# Patient Record
Sex: Female | Born: 1950
Health system: Southern US, Community
[De-identification: ages and names within clinical notes are randomized; demographics above are authoritative.]

## PROBLEM LIST (undated history)

## (undated) DIAGNOSIS — R7303 Prediabetes: Secondary | ICD-10-CM

## (undated) DIAGNOSIS — Z720 Tobacco use: Secondary | ICD-10-CM

## (undated) DIAGNOSIS — E785 Hyperlipidemia, unspecified: Secondary | ICD-10-CM

## (undated) DIAGNOSIS — K449 Diaphragmatic hernia without obstruction or gangrene: Secondary | ICD-10-CM

## (undated) DIAGNOSIS — M545 Low back pain, unspecified: Secondary | ICD-10-CM

## (undated) DIAGNOSIS — E079 Disorder of thyroid, unspecified: Secondary | ICD-10-CM

## (undated) DIAGNOSIS — M1990 Unspecified osteoarthritis, unspecified site: Secondary | ICD-10-CM

## (undated) DIAGNOSIS — D649 Anemia, unspecified: Secondary | ICD-10-CM

## (undated) DIAGNOSIS — K219 Gastro-esophageal reflux disease without esophagitis: Secondary | ICD-10-CM

## (undated) DIAGNOSIS — I10 Essential (primary) hypertension: Secondary | ICD-10-CM

## (undated) DIAGNOSIS — G629 Polyneuropathy, unspecified: Secondary | ICD-10-CM

## (undated) DIAGNOSIS — R51 Headache: Secondary | ICD-10-CM

## (undated) DIAGNOSIS — K5792 Diverticulitis of intestine, part unspecified, without perforation or abscess without bleeding: Secondary | ICD-10-CM

## (undated) DIAGNOSIS — M25561 Pain in right knee: Secondary | ICD-10-CM

## (undated) DIAGNOSIS — E039 Hypothyroidism, unspecified: Secondary | ICD-10-CM

## (undated) DIAGNOSIS — J189 Pneumonia, unspecified organism: Secondary | ICD-10-CM

## (undated) DIAGNOSIS — E669 Obesity, unspecified: Secondary | ICD-10-CM

## (undated) DIAGNOSIS — Z9889 Other specified postprocedural states: Secondary | ICD-10-CM

## (undated) HISTORY — DX: Low back pain, unspecified: M54.50

## (undated) HISTORY — DX: Disorder of thyroid, unspecified: E07.9

## (undated) HISTORY — PX: TUBAL LIGATION: SHX77

## (undated) HISTORY — DX: Tobacco use: Z72.0

## (undated) HISTORY — PX: TONSILLECTOMY: SUR1361

## (undated) HISTORY — DX: Polyneuropathy, unspecified: G62.9

## (undated) HISTORY — DX: Low back pain: M54.5

## (undated) HISTORY — PX: EYE SURGERY: SHX253

## (undated) HISTORY — DX: Other specified postprocedural states: Z98.890

## (undated) HISTORY — DX: Pain in right knee: M25.561

## (undated) HISTORY — DX: Diverticulitis of intestine, part unspecified, without perforation or abscess without bleeding: K57.92

## (undated) HISTORY — PX: BACK SURGERY: SHX140

## (undated) HISTORY — DX: Unspecified osteoarthritis, unspecified site: M19.90

## (undated) HISTORY — DX: Hyperlipidemia, unspecified: E78.5

## (undated) HISTORY — PX: OTHER SURGICAL HISTORY: SHX169

## (undated) HISTORY — DX: Diaphragmatic hernia without obstruction or gangrene: K44.9

## (undated) HISTORY — DX: Obesity, unspecified: E66.9

## (undated) SURGERY — ESOPHAGOGASTRODUODENOSCOPY (EGD) WITH PROPOFOL
Anesthesia: Monitor Anesthesia Care

---

## 2000-10-06 ENCOUNTER — Other Ambulatory Visit: Admission: RE | Admit: 2000-10-06 | Discharge: 2000-10-06 | Payer: Self-pay | Admitting: General Surgery

## 2000-10-14 ENCOUNTER — Ambulatory Visit (HOSPITAL_COMMUNITY): Admission: RE | Admit: 2000-10-14 | Discharge: 2000-10-14 | Payer: Self-pay | Admitting: General Surgery

## 2000-10-14 ENCOUNTER — Encounter: Payer: Self-pay | Admitting: General Surgery

## 2003-01-05 ENCOUNTER — Encounter: Payer: Self-pay | Admitting: Emergency Medicine

## 2003-01-05 ENCOUNTER — Inpatient Hospital Stay (HOSPITAL_COMMUNITY): Admission: EM | Admit: 2003-01-05 | Discharge: 2003-01-07 | Payer: Self-pay | Admitting: Emergency Medicine

## 2003-04-13 DIAGNOSIS — K5792 Diverticulitis of intestine, part unspecified, without perforation or abscess without bleeding: Secondary | ICD-10-CM

## 2003-04-13 DIAGNOSIS — Z9889 Other specified postprocedural states: Secondary | ICD-10-CM

## 2003-04-13 HISTORY — DX: Other specified postprocedural states: Z98.890

## 2003-04-13 HISTORY — DX: Diverticulitis of intestine, part unspecified, without perforation or abscess without bleeding: K57.92

## 2003-07-22 ENCOUNTER — Ambulatory Visit (HOSPITAL_COMMUNITY): Admission: RE | Admit: 2003-07-22 | Discharge: 2003-07-22 | Payer: Self-pay | Admitting: Preventative Medicine

## 2003-08-05 ENCOUNTER — Ambulatory Visit (HOSPITAL_COMMUNITY): Admission: RE | Admit: 2003-08-05 | Discharge: 2003-08-05 | Payer: Self-pay | Admitting: Preventative Medicine

## 2003-10-31 ENCOUNTER — Ambulatory Visit (HOSPITAL_COMMUNITY): Admission: RE | Admit: 2003-10-31 | Discharge: 2003-10-31 | Payer: Self-pay | Admitting: General Surgery

## 2004-06-30 ENCOUNTER — Ambulatory Visit (HOSPITAL_COMMUNITY): Admission: RE | Admit: 2004-06-30 | Discharge: 2004-06-30 | Payer: Self-pay | Admitting: General Surgery

## 2005-05-12 ENCOUNTER — Encounter: Admission: RE | Admit: 2005-05-12 | Discharge: 2005-05-12 | Payer: Self-pay | Admitting: Neurosurgery

## 2005-05-27 ENCOUNTER — Inpatient Hospital Stay (HOSPITAL_COMMUNITY): Admission: RE | Admit: 2005-05-27 | Discharge: 2005-05-28 | Payer: Self-pay | Admitting: Neurosurgery

## 2005-12-24 ENCOUNTER — Ambulatory Visit: Payer: Self-pay | Admitting: Family Medicine

## 2005-12-27 ENCOUNTER — Encounter (INDEPENDENT_AMBULATORY_CARE_PROVIDER_SITE_OTHER): Payer: Self-pay | Admitting: Family Medicine

## 2006-01-10 ENCOUNTER — Ambulatory Visit: Payer: Self-pay | Admitting: Family Medicine

## 2006-02-14 ENCOUNTER — Ambulatory Visit: Payer: Self-pay | Admitting: Family Medicine

## 2006-02-25 ENCOUNTER — Ambulatory Visit: Payer: Self-pay | Admitting: Family Medicine

## 2006-03-01 ENCOUNTER — Encounter: Payer: Self-pay | Admitting: Family Medicine

## 2006-03-01 DIAGNOSIS — M5137 Other intervertebral disc degeneration, lumbosacral region: Secondary | ICD-10-CM | POA: Insufficient documentation

## 2006-03-01 DIAGNOSIS — Z8719 Personal history of other diseases of the digestive system: Secondary | ICD-10-CM | POA: Insufficient documentation

## 2006-03-01 DIAGNOSIS — K5730 Diverticulosis of large intestine without perforation or abscess without bleeding: Secondary | ICD-10-CM | POA: Insufficient documentation

## 2006-03-01 DIAGNOSIS — M51379 Other intervertebral disc degeneration, lumbosacral region without mention of lumbar back pain or lower extremity pain: Secondary | ICD-10-CM | POA: Insufficient documentation

## 2006-03-08 ENCOUNTER — Encounter (INDEPENDENT_AMBULATORY_CARE_PROVIDER_SITE_OTHER): Payer: Self-pay | Admitting: Family Medicine

## 2006-03-14 ENCOUNTER — Ambulatory Visit: Payer: Self-pay | Admitting: Family Medicine

## 2006-05-23 ENCOUNTER — Encounter (INDEPENDENT_AMBULATORY_CARE_PROVIDER_SITE_OTHER): Payer: Self-pay | Admitting: Family Medicine

## 2006-06-14 ENCOUNTER — Ambulatory Visit: Payer: Self-pay | Admitting: Family Medicine

## 2006-06-14 DIAGNOSIS — E039 Hypothyroidism, unspecified: Secondary | ICD-10-CM | POA: Insufficient documentation

## 2006-06-14 DIAGNOSIS — E782 Mixed hyperlipidemia: Secondary | ICD-10-CM | POA: Insufficient documentation

## 2006-06-18 ENCOUNTER — Encounter (INDEPENDENT_AMBULATORY_CARE_PROVIDER_SITE_OTHER): Payer: Self-pay | Admitting: Family Medicine

## 2006-06-18 ENCOUNTER — Ambulatory Visit (HOSPITAL_COMMUNITY): Admission: RE | Admit: 2006-06-18 | Discharge: 2006-06-18 | Payer: Self-pay | Admitting: Family Medicine

## 2006-06-29 ENCOUNTER — Telehealth (INDEPENDENT_AMBULATORY_CARE_PROVIDER_SITE_OTHER): Payer: Self-pay | Admitting: Family Medicine

## 2006-07-12 ENCOUNTER — Ambulatory Visit: Payer: Self-pay | Admitting: Family Medicine

## 2006-07-18 ENCOUNTER — Telehealth (INDEPENDENT_AMBULATORY_CARE_PROVIDER_SITE_OTHER): Payer: Self-pay | Admitting: Family Medicine

## 2006-08-11 ENCOUNTER — Encounter (INDEPENDENT_AMBULATORY_CARE_PROVIDER_SITE_OTHER): Payer: Self-pay | Admitting: Family Medicine

## 2006-08-12 ENCOUNTER — Ambulatory Visit: Payer: Self-pay | Admitting: Family Medicine

## 2006-08-18 ENCOUNTER — Ambulatory Visit (HOSPITAL_COMMUNITY): Admission: RE | Admit: 2006-08-18 | Discharge: 2006-08-18 | Payer: Self-pay | Admitting: Family Medicine

## 2006-08-18 ENCOUNTER — Encounter (INDEPENDENT_AMBULATORY_CARE_PROVIDER_SITE_OTHER): Payer: Self-pay | Admitting: Family Medicine

## 2006-08-23 ENCOUNTER — Encounter (INDEPENDENT_AMBULATORY_CARE_PROVIDER_SITE_OTHER): Payer: Self-pay | Admitting: Family Medicine

## 2006-08-26 ENCOUNTER — Ambulatory Visit: Payer: Self-pay | Admitting: Family Medicine

## 2006-08-29 ENCOUNTER — Encounter (INDEPENDENT_AMBULATORY_CARE_PROVIDER_SITE_OTHER): Payer: Self-pay | Admitting: Family Medicine

## 2006-09-01 ENCOUNTER — Encounter (INDEPENDENT_AMBULATORY_CARE_PROVIDER_SITE_OTHER): Payer: Self-pay | Admitting: Family Medicine

## 2006-09-02 ENCOUNTER — Ambulatory Visit (HOSPITAL_COMMUNITY): Admission: RE | Admit: 2006-09-02 | Discharge: 2006-09-02 | Payer: Self-pay | Admitting: Family Medicine

## 2006-09-07 ENCOUNTER — Encounter (INDEPENDENT_AMBULATORY_CARE_PROVIDER_SITE_OTHER): Payer: Self-pay | Admitting: Family Medicine

## 2006-09-29 ENCOUNTER — Telehealth (INDEPENDENT_AMBULATORY_CARE_PROVIDER_SITE_OTHER): Payer: Self-pay | Admitting: *Deleted

## 2006-09-29 ENCOUNTER — Ambulatory Visit: Payer: Self-pay | Admitting: Family Medicine

## 2006-10-28 ENCOUNTER — Ambulatory Visit: Payer: Self-pay | Admitting: Family Medicine

## 2006-10-29 ENCOUNTER — Encounter (INDEPENDENT_AMBULATORY_CARE_PROVIDER_SITE_OTHER): Payer: Self-pay | Admitting: Family Medicine

## 2006-11-15 ENCOUNTER — Encounter: Admission: RE | Admit: 2006-11-15 | Discharge: 2006-11-15 | Payer: Self-pay | Admitting: Neurosurgery

## 2006-11-16 ENCOUNTER — Telehealth (INDEPENDENT_AMBULATORY_CARE_PROVIDER_SITE_OTHER): Payer: Self-pay | Admitting: Family Medicine

## 2006-11-18 ENCOUNTER — Ambulatory Visit: Payer: Self-pay | Admitting: Family Medicine

## 2006-11-19 ENCOUNTER — Encounter (INDEPENDENT_AMBULATORY_CARE_PROVIDER_SITE_OTHER): Payer: Self-pay | Admitting: Family Medicine

## 2006-11-20 LAB — CONVERTED CEMR LAB
Alkaline Phosphatase: 122 units/L — ABNORMAL HIGH (ref 39–117)
Basophils Absolute: 0.1 10*3/uL (ref 0.0–0.1)
Basophils Relative: 1 % (ref 0–1)
CO2: 22 meq/L (ref 19–32)
Chloride: 109 meq/L (ref 96–112)
Eosinophils Relative: 3 % (ref 0–5)
HCT: 41.4 % (ref 36.0–46.0)
Lymphocytes Relative: 40 % (ref 12–46)
Lymphs Abs: 3.9 10*3/uL — ABNORMAL HIGH (ref 0.7–3.3)
Neutro Abs: 4.7 10*3/uL (ref 1.7–7.7)
Neutrophils Relative %: 49 % (ref 43–77)
Platelets: 253 10*3/uL (ref 150–400)
Sodium: 143 meq/L (ref 135–145)
TSH: 10.568 microintl units/mL — ABNORMAL HIGH (ref 0.350–5.50)
Total Bilirubin: 0.6 mg/dL (ref 0.3–1.2)
WBC: 9.7 10*3/uL (ref 4.0–10.5)

## 2006-11-21 ENCOUNTER — Telehealth (INDEPENDENT_AMBULATORY_CARE_PROVIDER_SITE_OTHER): Payer: Self-pay | Admitting: *Deleted

## 2006-11-22 ENCOUNTER — Encounter (INDEPENDENT_AMBULATORY_CARE_PROVIDER_SITE_OTHER): Payer: Self-pay | Admitting: Family Medicine

## 2006-11-24 ENCOUNTER — Telehealth (INDEPENDENT_AMBULATORY_CARE_PROVIDER_SITE_OTHER): Payer: Self-pay | Admitting: *Deleted

## 2006-12-26 ENCOUNTER — Telehealth (INDEPENDENT_AMBULATORY_CARE_PROVIDER_SITE_OTHER): Payer: Self-pay | Admitting: Family Medicine

## 2007-01-02 ENCOUNTER — Encounter (INDEPENDENT_AMBULATORY_CARE_PROVIDER_SITE_OTHER): Payer: Self-pay | Admitting: Family Medicine

## 2007-01-06 ENCOUNTER — Ambulatory Visit: Payer: Self-pay | Admitting: Family Medicine

## 2007-01-18 ENCOUNTER — Ambulatory Visit: Payer: Self-pay | Admitting: Internal Medicine

## 2007-01-18 LAB — CONVERTED CEMR LAB
Glucose, Urine, Semiquant: NEGATIVE
Nitrite: POSITIVE
Specific Gravity, Urine: 1.02
Urobilinogen, UA: 0.2

## 2007-02-15 ENCOUNTER — Encounter (HOSPITAL_COMMUNITY)
Admission: RE | Admit: 2007-02-15 | Discharge: 2007-03-17 | Payer: Self-pay | Admitting: Physical Medicine and Rehabilitation

## 2007-02-17 ENCOUNTER — Ambulatory Visit: Payer: Self-pay | Admitting: Family Medicine

## 2007-02-20 ENCOUNTER — Telehealth (INDEPENDENT_AMBULATORY_CARE_PROVIDER_SITE_OTHER): Payer: Self-pay | Admitting: *Deleted

## 2007-02-20 LAB — CONVERTED CEMR LAB
AST: 13 units/L (ref 0–37)
Albumin: 3.9 g/dL (ref 3.5–5.2)
Alkaline Phosphatase: 104 units/L (ref 39–117)
BUN: 17 mg/dL (ref 6–23)
Calcium: 9.5 mg/dL (ref 8.4–10.5)
Creatinine, Ser: 0.85 mg/dL (ref 0.40–1.20)
Glucose, Bld: 91 mg/dL (ref 70–99)
HDL: 54 mg/dL (ref >39)
LDL Cholesterol: 112 mg/dL — ABNORMAL HIGH (ref 0–99)
TSH: 3.035 microintl units/mL (ref 0.350–5.50)
Total CHOL/HDL Ratio: 3.5
Triglycerides: 106 mg/dL (ref <150)

## 2007-03-02 ENCOUNTER — Encounter (INDEPENDENT_AMBULATORY_CARE_PROVIDER_SITE_OTHER): Payer: Self-pay | Admitting: Family Medicine

## 2007-03-17 ENCOUNTER — Ambulatory Visit: Payer: Self-pay | Admitting: Family Medicine

## 2007-03-21 ENCOUNTER — Encounter (HOSPITAL_COMMUNITY)
Admission: RE | Admit: 2007-03-21 | Discharge: 2007-04-12 | Payer: Self-pay | Admitting: Physical Medicine and Rehabilitation

## 2007-04-25 ENCOUNTER — Encounter (INDEPENDENT_AMBULATORY_CARE_PROVIDER_SITE_OTHER): Payer: Self-pay | Admitting: Family Medicine

## 2007-05-04 ENCOUNTER — Ambulatory Visit (HOSPITAL_COMMUNITY)
Admission: RE | Admit: 2007-05-04 | Discharge: 2007-05-04 | Payer: Self-pay | Admitting: Physical Medicine and Rehabilitation

## 2007-05-04 ENCOUNTER — Encounter (INDEPENDENT_AMBULATORY_CARE_PROVIDER_SITE_OTHER): Payer: Self-pay | Admitting: Physical Medicine and Rehabilitation

## 2007-05-04 ENCOUNTER — Ambulatory Visit: Payer: Self-pay | Admitting: Vascular Surgery

## 2007-06-05 ENCOUNTER — Encounter (INDEPENDENT_AMBULATORY_CARE_PROVIDER_SITE_OTHER): Payer: Self-pay | Admitting: Family Medicine

## 2007-06-14 ENCOUNTER — Encounter (INDEPENDENT_AMBULATORY_CARE_PROVIDER_SITE_OTHER): Payer: Self-pay | Admitting: Family Medicine

## 2007-06-16 ENCOUNTER — Ambulatory Visit: Payer: Self-pay | Admitting: Family Medicine

## 2007-06-21 ENCOUNTER — Encounter (INDEPENDENT_AMBULATORY_CARE_PROVIDER_SITE_OTHER): Payer: Self-pay | Admitting: Family Medicine

## 2007-06-21 LAB — CONVERTED CEMR LAB
HDL: 61 mg/dL
LDL Cholesterol: 123 mg/dL
Total CHOL/HDL Ratio: 3.4
VLDL: 26 mg/dL

## 2007-06-26 ENCOUNTER — Telehealth (INDEPENDENT_AMBULATORY_CARE_PROVIDER_SITE_OTHER): Payer: Self-pay | Admitting: Family Medicine

## 2007-06-28 ENCOUNTER — Telehealth (INDEPENDENT_AMBULATORY_CARE_PROVIDER_SITE_OTHER): Payer: Self-pay | Admitting: Family Medicine

## 2007-07-07 ENCOUNTER — Ambulatory Visit: Payer: Self-pay | Admitting: Family Medicine

## 2007-07-26 ENCOUNTER — Telehealth (INDEPENDENT_AMBULATORY_CARE_PROVIDER_SITE_OTHER): Payer: Self-pay | Admitting: Family Medicine

## 2007-07-28 ENCOUNTER — Ambulatory Visit: Payer: Self-pay | Admitting: Family Medicine

## 2007-08-11 ENCOUNTER — Encounter (INDEPENDENT_AMBULATORY_CARE_PROVIDER_SITE_OTHER): Payer: Self-pay | Admitting: Family Medicine

## 2007-09-08 ENCOUNTER — Ambulatory Visit: Payer: Self-pay | Admitting: Family Medicine

## 2007-09-09 ENCOUNTER — Encounter (INDEPENDENT_AMBULATORY_CARE_PROVIDER_SITE_OTHER): Payer: Self-pay | Admitting: Family Medicine

## 2007-09-11 ENCOUNTER — Emergency Department (HOSPITAL_COMMUNITY): Admission: EM | Admit: 2007-09-11 | Discharge: 2007-09-11 | Payer: Self-pay | Admitting: Emergency Medicine

## 2007-09-11 LAB — CONVERTED CEMR LAB
Basophils Absolute: 0.1 10*3/uL (ref 0.0–0.1)
Eosinophils Absolute: 0.2 10*3/uL (ref 0.0–0.7)
Eosinophils Relative: 2 % (ref 0–5)
HCT: 40.8 % (ref 36.0–46.0)
Lymphocytes Relative: 47 % — ABNORMAL HIGH (ref 12–46)
Lymphs Abs: 4.6 10*3/uL — ABNORMAL HIGH (ref 0.7–4.0)
Neutrophils Relative %: 42 % — ABNORMAL LOW (ref 43–77)
Platelets: 291 10*3/uL (ref 150–400)
RDW: 15 % (ref 11.5–15.5)
WBC: 9.6 10*3/uL (ref 4.0–10.5)

## 2007-09-12 ENCOUNTER — Telehealth (INDEPENDENT_AMBULATORY_CARE_PROVIDER_SITE_OTHER): Payer: Self-pay | Admitting: *Deleted

## 2007-09-21 ENCOUNTER — Encounter (INDEPENDENT_AMBULATORY_CARE_PROVIDER_SITE_OTHER): Payer: Self-pay | Admitting: Family Medicine

## 2007-10-06 ENCOUNTER — Ambulatory Visit: Payer: Self-pay | Admitting: Family Medicine

## 2007-10-06 LAB — CONVERTED CEMR LAB
Bilirubin Urine: NEGATIVE
Glucose, Urine, Semiquant: NEGATIVE
Ketones, urine, test strip: NEGATIVE
Specific Gravity, Urine: 1.02
Urobilinogen, UA: 0.2
WBC Urine, dipstick: NEGATIVE
pH: 6.5

## 2007-11-10 ENCOUNTER — Ambulatory Visit: Payer: Self-pay | Admitting: Family Medicine

## 2007-11-23 ENCOUNTER — Encounter (INDEPENDENT_AMBULATORY_CARE_PROVIDER_SITE_OTHER): Payer: Self-pay | Admitting: Family Medicine

## 2007-11-23 ENCOUNTER — Encounter (HOSPITAL_COMMUNITY): Admission: RE | Admit: 2007-11-23 | Discharge: 2007-12-23 | Payer: Self-pay | Admitting: Family Medicine

## 2007-12-07 ENCOUNTER — Encounter (INDEPENDENT_AMBULATORY_CARE_PROVIDER_SITE_OTHER): Payer: Self-pay | Admitting: Family Medicine

## 2007-12-08 ENCOUNTER — Ambulatory Visit: Payer: Self-pay | Admitting: Family Medicine

## 2007-12-09 ENCOUNTER — Encounter (INDEPENDENT_AMBULATORY_CARE_PROVIDER_SITE_OTHER): Payer: Self-pay | Admitting: Family Medicine

## 2007-12-11 LAB — CONVERTED CEMR LAB
Albumin: 4 g/dL (ref 3.5–5.2)
Bilirubin, Direct: 0.1 mg/dL (ref 0.0–0.3)
Total Bilirubin: 0.5 mg/dL (ref 0.3–1.2)

## 2007-12-13 ENCOUNTER — Ambulatory Visit: Payer: Self-pay | Admitting: Family Medicine

## 2007-12-13 DIAGNOSIS — F17200 Nicotine dependence, unspecified, uncomplicated: Secondary | ICD-10-CM | POA: Insufficient documentation

## 2007-12-26 ENCOUNTER — Ambulatory Visit: Admission: RE | Admit: 2007-12-26 | Discharge: 2007-12-26 | Payer: Self-pay | Admitting: Family Medicine

## 2007-12-26 ENCOUNTER — Encounter (INDEPENDENT_AMBULATORY_CARE_PROVIDER_SITE_OTHER): Payer: Self-pay | Admitting: Family Medicine

## 2007-12-29 ENCOUNTER — Ambulatory Visit: Payer: Self-pay | Admitting: Family Medicine

## 2008-04-25 ENCOUNTER — Ambulatory Visit: Payer: Self-pay | Admitting: Family Medicine

## 2008-07-08 ENCOUNTER — Ambulatory Visit: Payer: Self-pay | Admitting: Family Medicine

## 2008-07-08 ENCOUNTER — Other Ambulatory Visit: Admission: RE | Admit: 2008-07-08 | Discharge: 2008-07-08 | Payer: Self-pay | Admitting: Family Medicine

## 2008-07-08 ENCOUNTER — Encounter (INDEPENDENT_AMBULATORY_CARE_PROVIDER_SITE_OTHER): Payer: Self-pay | Admitting: Family Medicine

## 2008-07-11 ENCOUNTER — Encounter (INDEPENDENT_AMBULATORY_CARE_PROVIDER_SITE_OTHER): Payer: Self-pay | Admitting: Family Medicine

## 2008-07-11 ENCOUNTER — Encounter (INDEPENDENT_AMBULATORY_CARE_PROVIDER_SITE_OTHER): Payer: Self-pay | Admitting: *Deleted

## 2008-07-15 ENCOUNTER — Ambulatory Visit (HOSPITAL_COMMUNITY): Admission: RE | Admit: 2008-07-15 | Discharge: 2008-07-15 | Payer: Self-pay | Admitting: Family Medicine

## 2008-07-15 ENCOUNTER — Encounter (INDEPENDENT_AMBULATORY_CARE_PROVIDER_SITE_OTHER): Payer: Self-pay | Admitting: Family Medicine

## 2008-07-16 LAB — CONVERTED CEMR LAB
Basophils Absolute: 0.1 10*3/uL (ref 0.0–0.1)
CO2: 26 meq/L (ref 19–32)
Calcium: 9 mg/dL (ref 8.4–10.5)
Chloride: 109 meq/L (ref 96–112)
Cholesterol: 139 mg/dL (ref 0–200)
Creatinine, Ser: 0.69 mg/dL (ref 0.40–1.20)
Eosinophils Relative: 3 % (ref 0–5)
Glucose, Bld: 86 mg/dL (ref 70–99)
HCT: 38.7 % (ref 36.0–46.0)
LDL Cholesterol: 68 mg/dL (ref 0–99)
Lymphocytes Relative: 47 % — ABNORMAL HIGH (ref 12–46)
Lymphs Abs: 4.4 10*3/uL — ABNORMAL HIGH (ref 0.7–4.0)
Neutro Abs: 4.1 10*3/uL (ref 1.7–7.7)
Neutrophils Relative %: 43 % (ref 43–77)
Platelets: 300 10*3/uL (ref 150–400)
RDW: 15.3 % (ref 11.5–15.5)
Total Bilirubin: 0.6 mg/dL (ref 0.3–1.2)
Total CHOL/HDL Ratio: 2.6
Total Protein: 5.9 g/dL — ABNORMAL LOW (ref 6.0–8.3)
Triglycerides: 89 mg/dL (ref <150)
VLDL: 18 mg/dL (ref 0–40)

## 2008-08-02 ENCOUNTER — Ambulatory Visit: Payer: Self-pay | Admitting: Gastroenterology

## 2008-08-15 ENCOUNTER — Encounter: Payer: Self-pay | Admitting: Gastroenterology

## 2008-08-26 ENCOUNTER — Ambulatory Visit: Payer: Self-pay | Admitting: Family Medicine

## 2008-10-11 ENCOUNTER — Ambulatory Visit: Payer: Self-pay | Admitting: Family Medicine

## 2008-10-21 ENCOUNTER — Telehealth (INDEPENDENT_AMBULATORY_CARE_PROVIDER_SITE_OTHER): Payer: Self-pay | Admitting: *Deleted

## 2008-11-29 ENCOUNTER — Ambulatory Visit: Payer: Self-pay | Admitting: Family Medicine

## 2008-11-29 DIAGNOSIS — R5381 Other malaise: Secondary | ICD-10-CM | POA: Insufficient documentation

## 2008-11-29 DIAGNOSIS — R5383 Other fatigue: Secondary | ICD-10-CM

## 2008-11-29 DIAGNOSIS — G4700 Insomnia, unspecified: Secondary | ICD-10-CM | POA: Insufficient documentation

## 2008-12-02 LAB — CONVERTED CEMR LAB
BUN: 17 mg/dL (ref 6–23)
CO2: 24 meq/L (ref 19–32)
Calcium: 8.9 mg/dL (ref 8.4–10.5)
Chloride: 108 meq/L (ref 96–112)
Creatinine, Ser: 0.89 mg/dL (ref 0.40–1.20)
Eosinophils Absolute: 0.3 10*3/uL (ref 0.0–0.7)
Eosinophils Relative: 2 % (ref 0–5)
Lymphocytes Relative: 36 % (ref 12–46)
Lymphs Abs: 4.3 10*3/uL — ABNORMAL HIGH (ref 0.7–4.0)
Monocytes Relative: 7 % (ref 3–12)
Neutro Abs: 6.4 10*3/uL (ref 1.7–7.7)
Neutrophils Relative %: 54 % (ref 43–77)
RBC: 4.47 M/uL (ref 3.87–5.11)
TSH: 2.219 microintl units/mL (ref 0.350–4.500)
WBC: 11.8 10*3/uL — ABNORMAL HIGH (ref 4.0–10.5)

## 2008-12-04 ENCOUNTER — Ambulatory Visit (HOSPITAL_COMMUNITY): Admission: RE | Admit: 2008-12-04 | Discharge: 2008-12-04 | Payer: Self-pay | Admitting: Family Medicine

## 2008-12-20 ENCOUNTER — Ambulatory Visit: Payer: Self-pay | Admitting: Family Medicine

## 2008-12-25 ENCOUNTER — Telehealth (INDEPENDENT_AMBULATORY_CARE_PROVIDER_SITE_OTHER): Payer: Self-pay | Admitting: *Deleted

## 2008-12-27 ENCOUNTER — Encounter (INDEPENDENT_AMBULATORY_CARE_PROVIDER_SITE_OTHER): Payer: Self-pay | Admitting: Family Medicine

## 2009-04-23 ENCOUNTER — Encounter: Payer: Self-pay | Admitting: Family Medicine

## 2009-06-25 ENCOUNTER — Ambulatory Visit: Payer: Self-pay | Admitting: Family Medicine

## 2009-06-25 DIAGNOSIS — G8929 Other chronic pain: Secondary | ICD-10-CM | POA: Insufficient documentation

## 2009-06-25 DIAGNOSIS — M545 Low back pain: Secondary | ICD-10-CM

## 2009-08-05 LAB — CONVERTED CEMR LAB
AST: 10 units/L (ref 0–37)
Albumin: 3.9 g/dL (ref 3.5–5.2)
Alkaline Phosphatase: 120 units/L — ABNORMAL HIGH (ref 39–117)
BUN: 13 mg/dL (ref 6–23)
Calcium: 9.1 mg/dL (ref 8.4–10.5)
Chloride: 108 meq/L (ref 96–112)
HDL: 56 mg/dL (ref >39)
Hemoglobin: 13.2 g/dL (ref 12.0–15.0)
Hgb A1c MFr Bld: 5.8 % — ABNORMAL HIGH (ref <5.7)
LDL Cholesterol: 115 mg/dL — ABNORMAL HIGH (ref 0–99)
Potassium: 4.6 meq/L (ref 3.5–5.3)
RDW: 14.7 % (ref 11.5–15.5)
Sodium: 141 meq/L (ref 135–145)
TSH: 6.893 microintl units/mL — ABNORMAL HIGH (ref 0.350–4.500)
Total Protein: 6.2 g/dL (ref 6.0–8.3)
Vit D, 25-Hydroxy: <10 ng/mL — ABNORMAL LOW (ref 30–89)
WBC: 9.6 10*3/uL (ref 4.0–10.5)

## 2009-08-18 ENCOUNTER — Ambulatory Visit: Payer: Self-pay | Admitting: Family Medicine

## 2009-08-18 DIAGNOSIS — R609 Edema, unspecified: Secondary | ICD-10-CM | POA: Insufficient documentation

## 2009-08-18 DIAGNOSIS — E559 Vitamin D deficiency, unspecified: Secondary | ICD-10-CM | POA: Insufficient documentation

## 2009-09-23 ENCOUNTER — Ambulatory Visit: Payer: Self-pay | Admitting: Family Medicine

## 2009-09-23 DIAGNOSIS — R7309 Other abnormal glucose: Secondary | ICD-10-CM | POA: Insufficient documentation

## 2009-09-23 DIAGNOSIS — Z780 Asymptomatic menopausal state: Secondary | ICD-10-CM | POA: Insufficient documentation

## 2009-09-23 DIAGNOSIS — R51 Headache: Secondary | ICD-10-CM | POA: Insufficient documentation

## 2009-09-23 DIAGNOSIS — R519 Headache, unspecified: Secondary | ICD-10-CM | POA: Insufficient documentation

## 2009-09-23 LAB — CONVERTED CEMR LAB: OCCULT 1: NEGATIVE

## 2009-09-25 ENCOUNTER — Ambulatory Visit (HOSPITAL_COMMUNITY): Admission: RE | Admit: 2009-09-25 | Discharge: 2009-09-25 | Payer: Self-pay | Admitting: Family Medicine

## 2009-09-25 ENCOUNTER — Encounter: Payer: Self-pay | Admitting: Physician Assistant

## 2009-09-30 ENCOUNTER — Encounter: Payer: Self-pay | Admitting: Physician Assistant

## 2009-11-17 ENCOUNTER — Encounter: Payer: Self-pay | Admitting: Physician Assistant

## 2009-11-20 ENCOUNTER — Telehealth: Payer: Self-pay | Admitting: Family Medicine

## 2009-12-31 LAB — CONVERTED CEMR LAB
ALT: <8 units/L (ref 0–35)
AST: 15 units/L (ref 0–37)
Alkaline Phosphatase: 121 units/L — ABNORMAL HIGH (ref 39–117)
BUN: 18 mg/dL (ref 6–23)
Calcium: 9 mg/dL (ref 8.4–10.5)
Creatinine, Ser: 0.77 mg/dL (ref 0.40–1.20)
HDL: 55 mg/dL (ref >39)
Hgb A1c MFr Bld: 6 % — ABNORMAL HIGH (ref <5.7)
Total Bilirubin: 0.4 mg/dL (ref 0.3–1.2)
Total CHOL/HDL Ratio: 3.1
VLDL: 20 mg/dL (ref 0–40)

## 2010-01-27 ENCOUNTER — Ambulatory Visit: Payer: Self-pay | Admitting: Family Medicine

## 2010-02-09 ENCOUNTER — Encounter: Payer: Self-pay | Admitting: Family Medicine

## 2010-05-05 ENCOUNTER — Encounter: Payer: Self-pay | Admitting: Family Medicine

## 2010-05-06 LAB — CONVERTED CEMR LAB
CO2: 23 meq/L (ref 19–32)
Chloride: 107 meq/L (ref 96–112)
Creatinine, Ser: 0.72 mg/dL (ref 0.40–1.20)
Sodium: 138 meq/L (ref 135–145)

## 2010-05-12 NOTE — Assessment & Plan Note (Signed)
Summary: phy   Vital Signs:  Patient profile:   60 year old female Height:      62.5 inches Weight:      183 pounds BMI:     33.06 O2 Sat:      97 % Pulse rate:   75 / minute BP sitting:   140 / 76  (left arm)  Nutrition Counseling: Patient's BMI is greater than 25 and therefore counseled on weight management options. CC: physical, patient is having some pain in back and has a headache, Headache Is Patient Diabetic? No   Primary Provider:  Esperanza Sheets PA  CC:  physical, patient is having some pain in back and has a headache, and Headache.  History of Present Illness: Pt is here today for her physical. She states she developed a HA yesterday.  This is in the frontal area bilat. Started in the back of her neck.  Pain 8 on scale of 1 -10.  No trauma.  No visual changes, nasal congestion, nausea or vomiting.  No fever or chills.  She has not taken any over the counter pain relievers.  Last physical 3/10 yr ago.  Pap nl. Mamm 4/10. + SBE colonoscopy due 09/15/13.  Eye exam couple yrs. Dental care once a yr. Last tetnus in 1980's.      Headache HPI:      The patient comes in for an acute visit for headaches.  The headaches will last anywhere from 30 minutes to several days at a time.        On a scale of 1-10, she describes the headache as an 8.  The headaches are not associated with an aura.  The location of the headaches are bilateral.  Headache quality is pressure or tightness.        The patient denies first or worst H/A of life, new or progressive H/A lasting days, fever, malaise, focal neurologic symptoms, and impaired level of consciousness.         Allergies: 1)  ! Hydrocodone  Past History:  Past medical, surgical, family and social histories (including risk factors) reviewed for relevance to current acute and chronic problems.  Past Medical History: Reviewed history from 08/02/2008 and no changes required. HYPOTHYROIDISM (ICD-244.9) DIVERTICULOSIS, COLON  (ICD-562.10) Current Problems:  PLANTAR WART, LEFT HAND (ICD-078.19) OBESITY (ICD-278.00) KNEE PAIN, RIGHT (ICD-719.46) NEUROPATHY MILD, PERONEAL NERVE (ICD-956.3) HYPERLIPIDEMIA, MIXED (ICD-272.2) LOW BACK PAIN, CHRONIC (ICD-724.2) Hx of TOBACCO ABUSE (ICD-305.1) DEGENERATIVE DISC DISEASE, LUMBAR SPINE (ICD-722.52) HIATAL HERNIA WITH REFLUX, HX OF (ICD-V12.79)  Past Surgical History: Reviewed history from 07/08/2008 and no changes required. 1. Spinal fusion L-spine 2. Thyroid ablation - Dr.Moriyati - unsure of reason though 3. Umbilical hernia 4. T&A 5. Tubal Ligation  Family History: Reviewed history from 08/02/2008 and no changes required. Father: Dead 5 Bled to death but not sure as to cause. Mother: Dead 61 House fire Siblings: Sister Dead at 23 - CAD  with pacer/ Brother Dead 21 MVA Kids - son age 29 - healthy, girls age 22 and 76 - healthy No FH of Colon Cancer/Polyps, Breast, uterine, or ovarian CA.  Social History: Reviewed history from 06/25/2009 and no changes required. Occupation: Engineer, manufacturing at [employer] use-no Widowed x 1, divorced x 1 - died 15-Sep-2008 at age 16 from lung cancer. Lives alone Patient currently smokes: 1 pk q3 days. Edcuation: Degree in cosmotelogy Alcohol use-no  Review of Systems General:  Denies chills and fever. Eyes:  Denies blurring and double vision. ENT:  Denies decreased hearing, earache, nasal congestion, ringing in ears, and sore throat. CV:  Denies chest pain or discomfort and palpitations. GI:  Complains of abdominal pain; denies bloody stools, change in bowel habits, constipation, dark tarry stools, diarrhea, indigestion, nausea, and vomiting; EPIGASTRIC PAIN FROM HIATEL HERNIA. GU:  Denies abnormal vaginal bleeding, discharge, dysuria, incontinence, and urinary frequency. MS:  Complains of joint pain, joint swelling, and low back pain; RT KNEE ARTHRITIS, CHRONIC LBP. Neuro:  Complains of numbness and tingling; LLE NUMBNESS  & TINGLING. Psych:  Denies anxiety and depression. Allergy:  Denies itching eyes, seasonal allergies, and sneezing.  Physical Exam  General:  Well-developed,well-nourished,in no acute distress; alert,appropriate and cooperative throughout examination Head:  Normocephalic and atraumatic without obvious abnormalities. No apparent alopecia or balding. Eyes:  No corneal or conjunctival inflammation noted. EOMI. Perrla. Funduscopic exam benign, without hemorrhages, exudates or papilledema.  Ears:  External ear exam shows no significant lesions or deformities.  Otoscopic examination reveals clear canals, tympanic membranes are intact bilaterally without bulging, retraction, inflammation or discharge. Hearing is grossly normal bilaterally. Nose:  External nasal examination shows no deformity or inflammation. Nasal mucosa are pink and moist without lesions or exudates. Mouth:  Oral mucosa and oropharynx without lesions or exudates.  Neck:  No deformities, masses, or tenderness noted. Chest Wall:  no deformities and no mass.   Breasts:  No mass, nodules, thickening, tenderness, bulging, retraction, inflamation, nipple discharge or skin changes noted.   Lungs:  Normal respiratory effort, chest expands symmetrically. Lungs are clear to auscultation, no crackles or wheezes. Heart:  Normal rate and regular rhythm. S1 and S2 normal without gallop, murmur, click, rub or other extra sounds. Abdomen:  Bowel sounds positive,abdomen soft and non-tender without masses, organomegaly or hernias noted. Rectal:  No external abnormalities noted. Normal sphincter tone. No rectal masses or tenderness. Genitalia:  normal introitus, no external lesions, no vaginal discharge, normal uterus size and position, and no adnexal masses or tenderness.   Pulses:  R posterior tibial normal, R dorsalis pedis normal, R carotid normal, L posterior tibial normal, L dorsalis pedis normal, and L carotid normal.   Extremities:  No  edema Neurologic:  alert & oriented X3, cranial nerves II-XII intact, strength normal in all extremities, sensation intact to light touch, gait normal, and DTRs symmetrical and normal.   Skin:  Intact without suspicious lesions or rashes Cervical Nodes:  No lymphadenopathy noted Axillary Nodes:  No palpable lymphadenopathy Psych:  Cognition and judgment appear intact. Alert and cooperative with normal attention span and concentration. No apparent delusions, illusions, hallucinations   Impression & Recommendations:  Problem # 1:  Preventive Health Care (ICD-V70.0) Mamm and DEXA ordered. Pt refuses Tetnus vaccine or any other shots.  Problem # 2:  HYPERLIPIDEMIA, MIXED (ICD-272.2) Assessment: Comment Only  Her updated medication list for this problem includes:    Simvastatin 40 Mg Tabs (Simvastatin) ..... One tab by mouth at bedtime  Orders: T-CMP with estimated GFR ([id number]) T-Lipid Profile [phone])  Labs Reviewed: SGOT: 10 (08/04/2009)   SGPT: <8 U/L (08/04/2009)  Lipid Goals: Chol Goal: 200 (06/14/2006)   HDL Goal: 40 (06/14/2006)   LDL Goal: 130 (12/29/2007)   TG Goal: 150 (06/14/2006)  Prior 10 Yr Risk Heart Disease: 7 % (12/13/2007)   HDL:56 (08/04/2009), 53 (07/15/2008)  LDL:115 (08/04/2009), 68 (82/95/6213)  Chol:197 (08/04/2009), 139 (07/15/2008)  Trig:131 (08/04/2009), 89 (07/15/2008)  Problem # 3:  HYPOTHYROIDISM (ICD-244.9) Assessment: Comment Only  Her updated medication list for this problem includes:  Synthroid 100 Mcg Tabs (Levothyroxine sodium) ..... One tab by mouth once daily  *dose increase*  Orders: T-TSH [phone]) T-T4, Free [phone])  Labs Reviewed: TSH: 6.893 (08/04/2009)    HgBA1c: 5.8 (08/04/2009) Chol: 197 (08/04/2009)   HDL: 56 (08/04/2009)   LDL: 115 (08/04/2009)   TG: 131 (08/04/2009)  Problem # 4:  VITAMIN D DEFICIENCY (ICD-268.9) Assessment: Comment Only  Orders: T-Vitamin D (25-Hydroxy) ([id number]) DEXA Bone  Density Scan (DEXA Scan)  Problem # 5:  TOBACCO USER (ICD-305.1) Assessment: Comment Only  Encouraged smoking cessation   Problem # 6:  HEADACHE (ICD-784.0) Assessment: New  Her updated medication list for this problem includes:    Tramadol Hcl 50 Mg Tabs (Tramadol hcl) .Marland Kitchen... Take 1 every 6 hrs as needed for headache  Complete Medication List: 1)  Doxepin Hcl 25 Mg Caps (Doxepin hcl) .Marland Kitchen.. 1 daily 2)  Vitamin D (ergocalciferol) 50000 Unit Caps (Ergocalciferol) .... One tab by mouth at bedtime 3)  Synthroid 100 Mcg Tabs (Levothyroxine sodium) .... One tab by mouth once daily  *dose increase* 4)  Simvastatin 40 Mg Tabs (Simvastatin) .... One tab by mouth at bedtime 5)  Tramadol Hcl 50 Mg Tabs (Tramadol hcl) .... Take 1 every 6 hrs as needed for headache  Other Orders: T- Hemoglobin A1C ([id number]) Miscellaneous Other Radiology (Misc Other Rad) Hemoccult Guaiac-1 spec.(in office) ([id number])  Patient Instructions: 1)  Please schedule a follow-up appointment in 4 months. 2)  CONGRATULATIONS ON THE WEIGHT LOSS! kEEP UP THE GOOD WORK! 3)  Tobacco is very bad for your health and your loved ones! You Should stop smoking!. 4)  Stop Smoking Tips: Choose a Quit date. Cut down before the Quit date. decide what you will do as a substitute when you feel the urge to smoke(gum,toothpick,exercise). 5)  It is important that you exercise regularly at least 20 minutes 5 times a week. If you develop chest pain, have severe difficulty breathing, or feel very tired , stop exercising immediately and seek medical attention. 6)  I have ordered a mammorgram for you and a bone denisity test. 7)  I have ordered blood work for you to have drawn fasting. 8)  I suggest you try Tylenol or Ibuprofen for you headache.  If that does not help I have prescribed Tramadol to take.  If your headache does not improve in the next 2 days call the office.  And call sooner if it is worsening. Prescriptions: TRAMADOL HCL 50 MG  TABS (TRAMADOL HCL) take 1 every 6 hrs as needed for headache  #20 x 0   Entered and Authorized by:   Esperanza Sheets PA   Signed by:   Esperanza Sheets PA on 09/23/2009   Method used:   [facility] to        [facility]. [phone]* (retail)       [address]       [place], [place]  [address]       Ph: [phone] or [phone]       Fax: [phone]   RxID:   [id number]    Laboratory Results    Stool - Occult Blood Hemmoccult #1: negative Date: 09/23/2009

## 2010-05-12 NOTE — Letter (Signed)
Summary: Laboratory/X-Ray Results  [facility]  [address]   [place], [place] [address]   Phone: [phone]  Fax: [phone]    Lab/X-Ray Results  September 30, 2009  MRN: [id number]  [name] [address] [place], [place]  [address]    The results of your recent lab/x-ray has been reviewed and were found:  Your bone density test is normal.  You do not have osteoporosis. To prevent osteoporosis in the future the recommendations are to take your prescription Vitamin D once weekly as prescribed, make sure you get 1200 mg of Calcium a day either in your diet or with over the counter calcium supplements, and do weight bearing exercise such as walking at least 20 minutes 3 days a week.  Also limit any caffeine intake to 2 cups a day or less and stop smoking.   If you have any questions, please contact our office.     Esperanza Sheets PA

## 2010-05-12 NOTE — Progress Notes (Signed)
Summary: [facility]   Imported By: Lind Guest 05/07/2009 09:38:28  _____________________________________________________________________  External Attachment:    Type:   Image     Comment:   External Document

## 2010-05-12 NOTE — Assessment & Plan Note (Signed)
Summary: left ankle swelling- room 2   Vital Signs:  Patient profile:   60 year old female Height:      62.5 inches Weight:      189 pounds BMI:     34.14 O2 Sat:      99 % on Room air Pulse rate:   85 / minute Resp:     16 per minute BP sitting:   124 / 80  (left arm)  Vitals Entered By: Adella Hare LPN (Aug 19, 7827 2:21 PM) CC: left ankle swelling Is Patient Diabetic? No Pain Assessment Patient in pain? no      Comments didnt bring meds to ov   Primary Provider:  Esperanza Sheets PA  CC:  left ankle swelling.  History of Present Illness: Pt has a hx of chronic LBP with LLE radiculopathy.  She states that along with this she intermittently gets swelling in her Lt ankle area.  She notices this more when she has flare up of her LBP.  No pain in the ankle joint.  She has been told before that this is because of her back problems and the nerve problem in her LLE.  She denies trauma.  No calf pain.  No swelling in the lower leg.  No skin discoloration, redness, etc.  Pt has had labs recently.  Has started her Vitamin d once weekly. Hx of Hyperlipidemia.  Taking new medication as prescribed.  Complains of being tired all the time.      Allergies (verified): 1)  ! Hydrocodone  Past History:  Past medical history reviewed for relevance to current acute and chronic problems.  Past Medical History: Reviewed history from 08/02/2008 and no changes required. HYPOTHYROIDISM (ICD-244.9) DIVERTICULOSIS, COLON (ICD-562.10) Current Problems:  PLANTAR WART, LEFT HAND (ICD-078.19) OBESITY (ICD-278.00) KNEE PAIN, RIGHT (ICD-719.46) NEUROPATHY MILD, PERONEAL NERVE (ICD-956.3) HYPERLIPIDEMIA, MIXED (ICD-272.2) LOW BACK PAIN, CHRONIC (ICD-724.2) Hx of TOBACCO ABUSE (ICD-305.1) DEGENERATIVE DISC DISEASE, LUMBAR SPINE (ICD-722.52) HIATAL HERNIA WITH REFLUX, HX OF (ICD-V12.79)  Review of Systems General:  Complains of fatigue; denies chills and fever. CV:  Denies chest pain or  discomfort and palpitations. Resp:  Denies shortness of breath. MS:  Complains of low back pain. Derm:  Denies lesion(s) and rash. Neuro:  Complains of numbness and tingling.  Physical Exam  General:  Well-developed,well-nourished,in no acute distress; alert,appropriate and cooperative throughout examination Head:  Normocephalic and atraumatic without obvious abnormalities. No apparent alopecia or balding. Ears:  External ear exam shows no significant lesions or deformities.  Otoscopic examination reveals clear canals, tympanic membranes are intact bilaterally without bulging, retraction, inflammation or discharge. Hearing is grossly normal bilaterally. Nose:  External nasal examination shows no deformity or inflammation. Nasal mucosa are pink and moist without lesions or exudates. Mouth:  Oral mucosa and oropharynx without lesions or exudates.   Neck:  No deformities, masses, or tenderness noted. Lungs:  Normal respiratory effort, chest expands symmetrically. Lungs are clear to auscultation, no crackles or wheezes. Heart:  Normal rate and regular rhythm. S1 and S2 normal without gallop, murmur, click, rub or other extra sounds. Pulses:  L posterior tibial normal and L dorsalis pedis normal.   Extremities:  trace left pedal edema.  No PTE bilat, and no Rt pedal edema.  No calf tenderness to palp.  Neurologic:  alert & oriented X3, sensation intact to light touch, gait normal, and DTRs symmetrical and normal.   Skin:  Intact without suspicious lesions or rashes Cervical Nodes:  No lymphadenopathy noted Psych:  Cognition and judgment appear intact. Alert and cooperative with normal attention span and concentration. No apparent delusions, illusions, hallucinations   Impression & Recommendations:  Problem # 1:  PEDAL EDEMA (ICD-782.3) Assessment Deteriorated Discussed elevating leg. Pt has tried compression stocking in past & states it didn't help. Suggested she try wrapping it with an ace  bandage. Also recommend low salt diet.  Problem # 2:  BACK PAIN, LUMBAR, CHRONIC (ICD-724.2) Assessment: Unchanged continue follow up with Dr Eduard Clos Orders: Depo- Medrol 80mg  (J1040) Admin of Therapeutic Inj  intramuscular or subcutaneous (63875)  Problem # 3:  VITAMIN D DEFICIENCY (ICD-268.9) Assessment: Comment Only Continue Vit D prescription once weekly.  Problem # 4:  MALAISE AND FATIGUE (ICD-780.79) Assessment: Unchanged Discussed with pt that her labs were negative for anemia. We did increase her thyroid dose and start Vit D. Discussed with pt that some of her fatigue may be due to her chronic pain.  Complete Medication List: 1)  Doxepin Hcl 25 Mg Caps (Doxepin hcl) .Marland Kitchen.. 1 daily 2)  Vitamin D (ergocalciferol) 50000 Unit Caps (Ergocalciferol) .... One tab by mouth at bedtime 3)  Synthroid 100 Mcg Tabs (Levothyroxine sodium) .... One tab by mouth once daily  *dose increase* 4)  Simvastatin 40 Mg Tabs (Simvastatin) .... One tab by mouth at bedtime  Patient Instructions: 1)  Schedule an appt for physical in the next 2-4 weeks. 2)  Elevate your leg as we discussed. 3)  Limit your Sodium (Salt) to less than 2 grams a day(slightly less than 1/2 a teaspoon) to prevent fluid retention, swelling, or worsening of symptoms. 4)  You have received a shot of Depo Medrol today to help with inflammation.   Medication Administration  Injection # 1:    Medication: Depo- Medrol 80mg     Diagnosis: BACK PAIN, LUMBAR, CHRONIC (ICD-724.2)    Route: IM    Site: LUOQ gluteus    Exp Date: 01/12    Lot #: Johny [id number]    Mfr: Pharmacia    Patient tolerated injection without complications    Given by: Adella Hare LPN (Aug 19, 6431 3:09 PM)  Orders Added: 1)  Depo- Medrol 80mg  [J1040] 2)  Admin of Therapeutic Inj  intramuscular or subcutaneous [96372] 3)  Est. Patient Level IV [29518]

## 2010-05-12 NOTE — Progress Notes (Signed)
Summary: dr. Eduard Clos  dr. Eduard Clos   Imported By: Lind Guest 11/20/2009 14:33:39  _____________________________________________________________________  External Attachment:    Type:   Image     Comment:   External Document

## 2010-05-12 NOTE — Letter (Signed)
Summary: [facility]  [facility]   Imported By: Lind Guest 02/09/2010 14:43:43  _____________________________________________________________________  External Attachment:    Type:   Image     Comment:   External Document

## 2010-05-12 NOTE — Assessment & Plan Note (Signed)
Summary: office visit   Vital Signs:  Patient profile:   60 year old female Height:      62.5 inches Weight:      183.50 pounds BMI:     33.15 O2 Sat:      98 % on Room air Pulse rate:   86 / minute Resp:     16 per minute BP sitting:   130 / 78  (left arm)  Vitals Entered By: Mauricia Area CMA (January 27, 2010 3:55 PM) CC: Follow up.  Back and leg pain. Comments Did not bring meds   Primary Provider:  Esperanza Sheets PA  CC:  Follow up.  Back and leg pain.Marland Kitchen  History of Present Illness: Pt presents today for check up. Chronic LBP with LLE radiculopathy.  Is seeing Dr Eduard Clos.  Having trial of implant stimulator next week.  If works well will then have permanent stimulator inserted.  Hx of hypothroidism.  Takes synthroid daily. Hx of Hyperlipidemia.  Taking medications as prescribed and no side effects.  Following a low fat diet. Not exercising regularly. Hx of GERD.  No current problems with indigestion.  Uses a Prevacid every now and then as needed.  Labs 12-30-09.   Allergies (verified): 1)  ! Hydrocodone  Past History:  Past medical history reviewed for relevance to current acute and chronic problems.  Past Medical History: Reviewed history from 08/02/2008 and no changes required. HYPOTHYROIDISM (ICD-244.9) DIVERTICULOSIS, COLON (ICD-562.10) Current Problems:  PLANTAR WART, LEFT HAND (ICD-078.19) OBESITY (ICD-278.00) KNEE PAIN, RIGHT (ICD-719.46) NEUROPATHY MILD, PERONEAL NERVE (ICD-956.3) HYPERLIPIDEMIA, MIXED (ICD-272.2) LOW BACK PAIN, CHRONIC (ICD-724.2) Hx of TOBACCO ABUSE (ICD-305.1) DEGENERATIVE DISC DISEASE, LUMBAR SPINE (ICD-722.52) HIATAL HERNIA WITH REFLUX, HX OF (ICD-V12.79)  Review of Systems General:  Denies chills and fever. ENT:  Denies earache, nasal congestion, and sore throat. CV:  Denies chest pain or discomfort and palpitations. Resp:  Denies cough and shortness of breath. MS:  Complains of low back pain; denies joint pain.  Physical  Exam  General:  Well-developed,well-nourished,in no acute distress; alert,appropriate and cooperative throughout examination Head:  Normocephalic and atraumatic without obvious abnormalities. No apparent alopecia or balding. Ears:  External ear exam shows no significant lesions or deformities.  Otoscopic examination reveals clear canals, tympanic membranes are intact bilaterally without bulging, retraction, inflammation or discharge. Hearing is grossly normal bilaterally. Nose:  External nasal examination shows no deformity or inflammation. Nasal mucosa are pink and moist without lesions or exudates. Mouth:  Oral mucosa and oropharynx without lesions or exudates.   Neck:  No deformities, masses, or tenderness noted. Lungs:  Normal respiratory effort, chest expands symmetrically. Lungs are clear to auscultation, no crackles or wheezes. Heart:  Normal rate and regular rhythm. S1 and S2 normal without gallop, murmur, click, rub or other extra sounds. Cervical Nodes:  No lymphadenopathy noted Psych:  Cognition and judgment appear intact. Alert and cooperative with normal attention span and concentration. No apparent delusions, illusions, hallucinations   Impression & Recommendations:  Problem # 1:  BACK PAIN, LUMBAR, CHRONIC (ICD-724.2) Assessment Comment Only  Her updated medication list for this problem includes:    Tramadol Hcl 50 Mg Tabs (Tramadol hcl) .Marland Kitchen... Take 1 every 6 hrs as needed for headache  Problem # 2:  HYPOTHYROIDISM (ICD-244.9) Assessment: Comment Only  Her updated medication list for this problem includes:    Synthroid 100 Mcg Tabs (Levothyroxine sodium) ..... One tab by mouth once daily  *dose increase*  Problem # 3:  HYPERGLYCEMIA (ICD-790.29)  Orders:  T- Hemoglobin A1C ([id number]) T-Basic Metabolic Panel ([id number])  Complete Medication List: 1)  Doxepin Hcl 25 Mg Caps (Doxepin hcl) .Marland Kitchen.. 1 daily 2)  Vitamin D 1000 Unit Tabs (Cholecalciferol) .... Take one  daily 3)  Synthroid 100 Mcg Tabs (Levothyroxine sodium) .... One tab by mouth once daily  *dose increase* 4)  Simvastatin 40 Mg Tabs (Simvastatin) .... One tab by mouth at bedtime 5)  Tramadol Hcl 50 Mg Tabs (Tramadol hcl) .... Take 1 every 6 hrs as needed for headache 6)  Furosemide 20 Mg Tabs (Furosemide) .... One tab by mouth once daily 7)  Ketoconazole 2 % Crea (Ketoconazole) .... Apply to heels of feet two times a day  Patient Instructions: 1)  Please schedule a follow-up appointment in 3 months. 2)  Have blood work drawn fasting in 2 months. 3)  Decrease your carbohydrates and sweets in your diet.  Increase your fruits, veggies, and lean meats. 4)  Continue follow up with Dr Eduard Clos Prescriptions: KETOCONAZOLE 2 % CREA (KETOCONAZOLE) apply to heels of feet two times a day  #30 grams x 0   Entered and Authorized by:   Esperanza Sheets PA   Signed by:   Esperanza Sheets PA on 01/27/2010   Method used:   [facility]ron[facility] to        [facility]. [phone]* (retail)       [address]       [place], [place]  [address]       Ph: [phone] or [phone]       Fax: [phone]   RxID:   [id number] TRAMADOL HCL 50 MG TABS (TRAMADOL HCL) take 1 every 6 hrs as needed for headache  #30 x 0   Entered and Authorized by:   Esperanza Sheets PA   Signed by:   Esperanza Sheets PA on 01/27/2010   Method used:   Electronically to        [facility]. [phone]* (retail)       [address]       [place], [place]  [address]       Ph: [phone] or [phone]       Fax: [phone]   RxID:   [id number] SIMVASTATIN 40 MG TABS (SIMVASTATIN) one tab by mouth at bedtime  #30.0 Tablet x 5   Entered and Authorized by:   Esperanza Sheets PA   Signed by:   Esperanza Sheets PA on 01/27/2010   Method used:   Electronically to        [facility]. [phone]* (retail)       [address]       [place], [place]  [address]       Ph: [phone] or [phone]       Fax:  [phone]   RxID:   [id number]    Orders Added: 1)  T- Hemoglobin A1C [83036-23375] 2)  T-Basic Metabolic Panel [[id number]] 3)  Est. Patient Level IV [31540]

## 2010-05-12 NOTE — Assessment & Plan Note (Signed)
Summary: NEW PATIENT- room 3   Vital Signs:  Patient profile:   60 year old female Height:      62.5 inches Weight:      191.50 pounds BMI:     34.59 O2 Sat:      100 % on Room air Pulse rate:   87 / minute Resp:     16 per minute BP sitting:   130 / 70  (left arm)  Vitals Entered By: Adella Hare LPN (June 25, 2009 2:24 PM)  Nutrition Counseling: Patient's BMI is greater than 25 and therefore counseled on weight management options. CC: follow-up visit Is Patient Diabetic? No Pain Assessment Patient in pain? no        Primary Provider:  Esperanza Sheets PA  CC:  follow-up visit.  History of Present Illness: New pt here to establish care with PCP.  Pt has chronic LBP.  Hx of surgery.  Seeing pain clinic.  Tried pain patch but pt didn't like it.  States it made her hiatel hernia flare up so d/c.  Also tried epidurals but no help.  Hx of hiatel hernia. States it "acts up now & then". But overall is not bothersome to her.  Nexium was no help.  Takes Synthroid daily.   Pt d/c her cholesterol medication approx 2 mos ago.  Ran out. Is due for labs. Last physical/pap was in March 2010.  Mamm due 4-11.    Current Medications (verified): 1)  Nexium 20 Mg  Cpdr (Esomeprazole Magnesium) .... One Daily 2)  Synthroid 75 Mcg  Tabs (Levothyroxine Sodium) .... One Daily 3)  Lipitor 20 Mg  Tabs (Atorvastatin Calcium) .... One Daily 4)  Flexeril 10 Mg Tabs (Cyclobenzaprine Hcl) .... One Three Times A Day As Needed  Allergies (verified): 1)  ! Hydrocodone  Past History:  Past medical, surgical, family and social histories (including risk factors) reviewed, and no changes noted (except as noted below).  Past Medical History: Reviewed history from 08/02/2008 and no changes required. 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 1. Spinal fusion L-spine 2. Thyroid ablation - Dr.Moriyati - unsure of reason though 3. Umbilical hernia 4. T&A 5. Tubal Ligation  Family History: Reviewed history from 08/02/2008 and no changes required. Father: Dead 29 Bled to death but not sure as to cause. Mother: Dead 79 House fire Siblings: Sister Dead at 64 - CAD  with pacer/ Brother Dead 21 MVA Kids - son age 32 - healthy, girls age 21 and 31 - healthy No FH of Colon Cancer/Polyps, Breast, uterine, or ovarian CA.  Social History: Reviewed history from 11/29/2008 and no changes required. Occupation: Engineer, manufacturing at [employer] use-no Widowed x 1, divorced x 1 - died 2008-09-18 at age 16 from lung cancer. Lives alone Patient currently smokes: 1 pk q3 days. Edcuation: Degree in cosmotelogy Alcohol use-no  Review of Systems General:  Denies chills and fever. ENT:  Denies earache, nasal congestion, sinus pressure, and sore throat. CV:  Denies chest pain or discomfort, palpitations, and swelling of feet. Resp:  Denies cough, shortness of breath, and wheezing. GI:  Denies indigestion.  Physical Exam  General:  Well-developed,well-nourished,in no acute  distress; alert,appropriate and cooperative throughout examination Head:  Normocephalic and atraumatic without obvious abnormalities. No apparent alopecia or balding. Ears:  External ear exam shows no significant lesions or deformities.  Otoscopic examination reveals clear canals, tympanic membranes are intact bilaterally without bulging, retraction, inflammation or discharge. Hearing is grossly normal bilaterally. Nose:  External nasal examination shows no deformity or  inflammation. Nasal mucosa are pink and moist without lesions or exudates. Mouth:  Oral mucosa and oropharynx without lesions or exudates.  teeth missing.   Neck:  No deformities, masses, or tenderness noted.no thyromegaly.   Lungs:  Normal respiratory effort, chest expands symmetrically. Lungs are clear to auscultation, no crackles or wheezes. Heart:  Normal rate and regular rhythm. S1 and S2 normal without gallop, murmur, click, rub or other extra sounds. Abdomen:  Bowel sounds positive,abdomen soft and non-tender without masses, organomegaly or hernias noted. Cervical Nodes:  No lymphadenopathy noted Psych:  Cognition and judgment appear intact. Alert and cooperative with normal attention span and concentration. No apparent delusions, illusions, hallucinations   Impression & Recommendations:  Problem # 1:  HYPOTHYROIDISM (ICD-244.9) Assessment Unchanged  Pt has one more refill on her Synthroid.  Will wait to refill until receive labs are received.  Her updated medication list for this problem includes:    Synthroid 75 Mcg Tabs (Levothyroxine sodium) ..... One daily  Orders: T-TSH ([id number])  Problem # 2:  BACK PAIN, LUMBAR, CHRONIC (ICD-724.2) Pt will continue to f/u with pain clinic.  The following medications were removed from the medication list:    Flexeril 10 Mg Tabs (Cyclobenzaprine hcl) ..... One three times a day as needed  Problem # 3:  HYPERLIPIDEMIA, MIXED (ICD-272.2)  The following medications were removed from the medication list:    Lipitor 20 Mg Tabs (Atorvastatin calcium) ..... One daily  Orders: T-Comprehensive Metabolic Panel ([phone]) T-Lipid Profile ([phone])  Problem # 4:  OBESITY (ICD-278.00) Assessment: Improved  Ht: 62.5 (06/25/2009)   Wt: 191.50 (06/25/2009)   BMI: 34.59 (06/25/2009)  Encouraged exercise & wt loss.  Pt is limited in her exercise abililty due to her back probs.  Recommended water aerobics. 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.  Orders: T- Hemoglobin A1C ([id number])  Complete Medication List: 1)  Synthroid 75 Mcg Tabs (Levothyroxine sodium) .... One daily 2)  Doxepin Hcl 25 Mg Caps (Doxepin hcl) .Marland Kitchen.. 1 daily  Other Orders: T-CBC No Diff ([id number]) T-Vitamin D (25-Hydroxy) ([id number])  Patient Instructions: 1)  Schedule appt in 1-2 mos for physical. 2)  Have lab work done fasting. 3)  Tobacco is very bad for your health and your loved ones! You Should stop smoking!. 4)  Stop Smoking Tips: Choose a Quit date. Cut down before the Quit date. decide what you will do as a substitute when you feel the urge to smoke(gum,toothpick,exercise). 5)  It is important that you exercise regularly at least 20 minutes 5 times a week. If you develop chest pain, have severe difficulty breathing, or feel very tired , stop exercising immediately and seek medical attention. 6)  You need to lose weight. Consider a lower calorie diet and regular exercise.

## 2010-05-12 NOTE — Progress Notes (Signed)
  Phone Note Call from Patient   Reason for Call: Referral Summary of Call: left leg from the calf down appears swollen. States she had a fusion in her back and she has had problems with her left leg all the time. When its hot outside it feels like its gonna bust. Feels like little shocks in that leg. Hasn't had any problems with the other leg swelling. No tenderness or warmth noted. Just feels tight.  Initial call taken by: Everitt Amber LPN,  November 20, 2009 4:13 PM  Follow-up for Phone Call        saw pt who states the leg swelling is chronic, she wants fluid pills , I told her I would  let you know since not anemergency and you are her Yarixa [employer]  Follow-up by: Syliva Overman MD,  November 20, 2009 5:15 PM  Additional Follow-up for Phone Call Additional follow up Details #1::        Rx Lasix 20 mg #30 take one tab daily as needed for swelling. no rf. Advise pt to elevate leg also to help. Additional Follow-up by: Esperanza Sheets PA,  November 21, 2009 8:21 AM    Additional Follow-up for Phone Call Additional follow up Details #2::    rx sent, called patient, left detailed message on voicemail Follow-up by: Adella Hare LPN,  November 21, 2009 8:50 AM  New/Updated Medications: FUROSEMIDE 20 MG TABS (FUROSEMIDE) one tab by mouth once daily Prescriptions: FUROSEMIDE 20 MG TABS (FUROSEMIDE) one tab by mouth once daily  #30 x 0   Entered by:   Adella Hare LPN   Authorized by:   Syliva Overman MD   Signed by:   Adella Hare LPN on 16/01/9603   Method used:   Electronically to        [facility] [phone]* (retail)       [address]       [place], [place]  [address]       Ph: [phone] or [phone]       Fax: [phone]   RxID:   [id number]   Appended Document:  patient aware and was advised if swelling continues to be a problem, schedule an OV

## 2010-05-14 NOTE — Letter (Signed)
Summary: Letter  Letter   Imported By: Lind Guest 05/06/2010 14:10:22  _____________________________________________________________________  External Attachment:    Type:   Image     Comment:   External Document

## 2010-06-24 ENCOUNTER — Encounter: Payer: Self-pay | Admitting: Family Medicine

## 2010-06-24 ENCOUNTER — Ambulatory Visit (INDEPENDENT_AMBULATORY_CARE_PROVIDER_SITE_OTHER): Payer: BC Managed Care – PPO | Admitting: Family Medicine

## 2010-06-24 DIAGNOSIS — E669 Obesity, unspecified: Secondary | ICD-10-CM

## 2010-06-24 DIAGNOSIS — F17200 Nicotine dependence, unspecified, uncomplicated: Secondary | ICD-10-CM

## 2010-06-24 DIAGNOSIS — E039 Hypothyroidism, unspecified: Secondary | ICD-10-CM

## 2010-07-09 NOTE — Assessment & Plan Note (Signed)
Summary: f up   Vital Signs:  Patient profile:   60 year old female Height:      62.5 inches Weight:      177.50 pounds BMI:     32.06 O2 Sat:      95 % Pulse rate:   76 / minute Pulse rhythm:   regular Resp:     16 per minute BP sitting:   140 / 84  (left arm)  Vitals Entered By: Everitt Amber LPN (June 24, 2010 3:54 PM)  Nutrition Counseling: Patient's BMI is greater than 25 and therefore counseled on weight management options. CC: Follow up chronic problems   Primary Care Provider:  Milus Mallick. Caspian Deleonardis MD  CC:  Follow up chronic problems.  History of Present Illness: Reports  that thshe is doing well. Denies recent fever or chills. Denies sinus pressure, nasal congestion , ear pain or sore throat. Denies chest congestion, or cough productive of sputum. Denies chest pain, palpitations, PND, orthopnea or leg swelling. Denies abdominal pain, nausea, vomitting, diarrhea or constipation. Denies change in bowel movements or bloody stool. Denies dysuria , frequency, incontinence or hesitancy. Denies  joint pain, swelling, or reduced mobility. Denies headaches, vertigo, seizures. Denies depression, anxiety or insomnia. Denies  rash, lesions, or itch.     Preventive Screening-Counseling & Management  Alcohol-Tobacco     Smoking Cessation Counseling: yes  Allergies: 1)  ! Hydrocodone  Past History:  Past Surgical History: 1. Spinal fusion L-spine  2007 2. Thyroid ablation - Dr.Moriyati - unsure of reason though 3. Umbilical hernia 4. T&A 5. Tubal Ligation  Review of Systems      See HPI General:  Complains of fatigue. Eyes:  Denies blurring, discharge, and eye irritation. Resp:  Complains of cough and shortness of breath; denies sputum productive and wheezing. MS:  chronic right lower ext pain and swelling following back surgery seems to have worsened, she had sciatica preop. Derm:  Complains of lesion(s); mole on left thigh and right thigh, wants to observe,  with no derm involvememnt or biopsy at this time. Endo:  Denies cold intolerance, excessive hunger, excessive thirst, and excessive urination. Heme:  Denies abnormal bruising and bleeding. Allergy:  Denies hives or rash and itching eyes.  Physical Exam  General:  Well-developed,well-nourished,in no acute distress; alert,appropriate and cooperative throughout examination HEENT: No facial asymmetry,  EOMI, No sinus tenderness, TM's Clear, oropharynx  pink and moist.   Chest: Clear to auscultation bilaterally. decreased throughout. CVS: S1, S2, No murmurs, No S3.   Abd: Soft, Nontender.  MS: decreased  ROM spine adequate in  hips, shoulders and knees.  Ext: No edema.   CNS: CN 2-12 intact, power tone and sensation normal throughout.   Skin: Intact, pigmented nevi x 2 , benign appearing.  Psych: Good eye contact, normal affect.  Memory intact, not anxious or depressed appearing.    Impression & Recommendations:  Problem # 1:  HEADACHE (ICD-784.0) Assessment Deteriorated  Her updated medication list for this problem includes:    Tramadol Hcl 50 Mg Tabs (Tramadol hcl) .Marland Kitchen... Take 1 every 6 hrs as needed for headache  Problem # 2:  HYPERGLYCEMIA (ICD-790.29) Assessment: Comment Only  Orders: T- Hemoglobin A1C ([id number]) Pt advised to reduce carbohydrate intake, espescially sweets, and to start regular physical activity, at least 30 minutes 5 days weekly, to enable weight loss, and reduce the risk of becoming diabetic   Problem # 3:  TOBACCO USER (ICD-305.1) Assessment: Unchanged  Orders: CXR- 2view (CXR)  Encouraged smoking cessation and discussed different methods for smoking cessation.   Problem # 4:  HYPOTHYROIDISM (ICD-244.9) Assessment: Comment Only  Her updated medication list for this problem includes:    Synthroid 100 Mcg Tabs (Levothyroxine sodium) ..... One tab by mouth once daily  *dose increase*  Orders: T-TSH ([id number])  Labs Reviewed: TSH: 0.952  (12/30/2009)    HgBA1c: 5.8 (05/02/2010) Chol: 171 (12/30/2009)   HDL: 55 (12/30/2009)   LDL: 96 (12/30/2009)   TG: 101 (12/30/2009)  Complete Medication List: 1)  Doxepin Hcl 25 Mg Caps (Doxepin hcl) .Marland Kitchen.. 1 daily 2)  Vitamin D 1000 Unit Tabs (Cholecalciferol) .... Take one daily 3)  Synthroid 100 Mcg Tabs (Levothyroxine sodium) .... One tab by mouth once daily  *dose increase* 4)  Simvastatin 40 Mg Tabs (Simvastatin) .... One tab by mouth at bedtime 5)  Tramadol Hcl 50 Mg Tabs (Tramadol hcl) .... Take 1 every 6 hrs as needed for headache 6)  Furosemide 20 Mg Tabs (Furosemide) .... One tab by mouth once daily 7)  Ketoconazole 2 % Crea (Ketoconazole) .... Apply to heels of feet two times a day  Other Orders: T-Basic Metabolic Panel [id number]) T-Lipid Profile [phone]) T-Hepatic Function [phone]) T-CBC w/Diff [phone])  Patient Instructions: 1)  cPE  in 4 months, with pap. 2)  It is important that you exercise regularly at least 20 minutes 5 times a week. If you develop chest pain, have severe difficulty breathing, or feel very tired , stop exercising immediately and seek medical attention. 3)  You need to lose weight. Consider a lower calorie diet and regular exercise.  4)  Tobacco is very bad for your health and your loved ones! You Should stop smoking!. 5)  Stop Smoking Tips: Choose a Quit date. Cut down before the Quit date. decide what you will do as a substitute when you feel the urge to smoke(gum,toothpick,exercise). 6)  BMP prior to visit, ICD-9: 7)  Hepatic Panel prior to visit, ICD-9: 8)  Lipid Panel prior to visit, ICD-9: 9)  TSH prior to visit, ICD-9:   fasting asap 10)  CBC w/ Diff prior to visit, ICD-9: 11)  HbgA1C prior to visit, ICD-9:   Orders Added: 1)  Est. Patient Level IV [66440] 2)  T-Basic Metabolic Panel [[id number]] 3)  T-Lipid Profile [[id number]] 4)  T-Hepatic Function [80076-22960] 5)  T-CBC w/Diff [[id number]] 6)  T- Hemoglobin A1C  [83036-23375] 7)  T-TSH [87564-33295] 8)  CXR- 2view [CXR]

## 2010-08-04 ENCOUNTER — Other Ambulatory Visit: Payer: Self-pay | Admitting: Family Medicine

## 2010-08-04 ENCOUNTER — Ambulatory Visit (HOSPITAL_COMMUNITY)
Admission: RE | Admit: 2010-08-04 | Discharge: 2010-08-04 | Disposition: A | Discharge status: Home / Self Care | Payer: BC Managed Care – PPO | Source: Ambulatory Visit | Attending: Family Medicine | Admitting: Family Medicine

## 2010-08-04 DIAGNOSIS — F17200 Nicotine dependence, unspecified, uncomplicated: Secondary | ICD-10-CM

## 2010-08-04 DIAGNOSIS — Z0189 Encounter for other specified special examinations: Secondary | ICD-10-CM | POA: Insufficient documentation

## 2010-08-05 LAB — HEMOGLOBIN A1C
Hgb A1c MFr Bld: 6 % — ABNORMAL HIGH (ref <5.7)
Mean Plasma Glucose: 126 mg/dL — ABNORMAL HIGH (ref <117)

## 2010-08-05 LAB — CBC WITH DIFFERENTIAL/PLATELET
Eosinophils Absolute: 0.2 10*3/uL (ref 0.0–0.7)
Eosinophils Relative: 3 % (ref 0–5)
Hemoglobin: 13.3 g/dL (ref 12.0–15.0)
Lymphs Abs: 3.1 10*3/uL (ref 0.7–4.0)
MCH: 28.3 pg (ref 26.0–34.0)
MCV: 85.3 fL (ref 78.0–100.0)
Monocytes Absolute: 0.5 10*3/uL (ref 0.1–1.0)
Monocytes Relative: 5 % (ref 3–12)
RBC: 4.7 MIL/uL (ref 3.87–5.11)

## 2010-08-05 LAB — LIPID PANEL: Total CHOL/HDL Ratio: 3 Ratio

## 2010-08-05 LAB — BASIC METABOLIC PANEL
CO2: 25 mEq/L (ref 19–32)
Calcium: 9.1 mg/dL (ref 8.4–10.5)
Creat: 0.67 mg/dL (ref 0.40–1.20)
Glucose, Bld: 84 mg/dL (ref 70–99)

## 2010-08-05 LAB — TSH: TSH: 0.167 u[IU]/mL — ABNORMAL LOW (ref 0.350–4.500)

## 2010-08-05 LAB — HEPATIC FUNCTION PANEL
AST: 12 U/L (ref 0–37)
Alkaline Phosphatase: 127 U/L — ABNORMAL HIGH (ref 39–117)
Bilirubin, Direct: 0.2 mg/dL (ref 0.0–0.3)
Total Bilirubin: 0.7 mg/dL (ref 0.3–1.2)

## 2010-08-28 NOTE — Procedures (Signed)
   NAME:  Victoria Reyes, Victoria Reyes                         ACCOUNT NO.:  [id number]   MEDICAL RECORD NO.:  [id number]                   PATIENT TYPE:  INP   LOCATION:  IC04                                 FACILITY:  [facility]   PHYSICIAN:  Thomas C. Wall, M.D.                DATE OF BIRTH:  09-24-1950   DATE OF PROCEDURE:  01/07/2003  DATE OF DISCHARGE:                                  ECHOCARDIOGRAM   INDICATIONS FOR PROCEDURE:  Rule out pericardial effusion, unspecified  cardiovascular disease. (429.2)   TECHNICAL QUALITY:  The echocardiogram is technically adequate.   CONCLUSION:  1. Normal left and right atrial size.  2. Normal left ventricular chamber size and overall systolic function.     Ejection fraction greater than or equal to 60%. There is no segmental     wall motion abnormalities.  3. No valvular abnormalities.  4. Normal right sided structures and function.  5. No pericardial effusion.      ___________________________________________                                            Jesse Sans Wall, M.D.   TCW/MEDQ  D:  01/07/2003  T:  01/07/2003  Job:  [id number]   cc:   Hanley Hays. Dechurch, M.D.  [address]  [place]  [place] [address]  Fax: [phone]

## 2010-08-28 NOTE — Op Note (Signed)
NAMEBEATA, Victoria Reyes NO.:  [id number]   MEDICAL RECORD NO.:  [id number]          PATIENT TYPE:  INP   LOCATION:  2899                         FACILITY:  [facility]   PHYSICIAN:  Reinaldo Meeker, M.D. DATE OF BIRTH:  29-Jun-1950   DATE OF PROCEDURE:  05/27/2005  DATE OF DISCHARGE:                                 OPERATIVE REPORT   PREOPERATIVE DIAGNOSIS:  Spondylolisthesis and lateral recess stenosis, L5-  S1.   POSTOPERATIVE DIAGNOSIS:  Spondylolisthesis and lateral recess stenosis, L5-  S1.   PROCEDURE:  L5-S1 Gill procedure followed by posterior lumbar interbody  fusion with Nuvasive bony spacer and PEEK cage followed by trans-facet screw  fixation and posterolateral fusion.   SECONDARY PROCEDURE:  Microdissection L5-S1 disc and S1 nerve roots  bilaterally as well as interoperative EMG monitoring.   SURGEON:  Reinaldo Meeker, M.D.   ASSISTANT:  Tia Alert, M.D.   PROCEDURE IN DETAIL:  After being placed in the prone position, the  patient's back was prepped and draped in the usual sterile fashion.  Localizing  fluoroscopy was used prior to the incision to identify the  appropriate level.  A midline incision was made above the spinous processes  of L5 and S1.  Using Bovie cutting current, the incision was carried down to  the spinous processes.  Subperiosteal dissection was then carried out  bilaterally on the spinous processes, lamina, and facet joints.  Self-  retaining retractor was placed for exposure.  An x-ray showed we approached  the appropriate level.  Using the Stille rongeur, spinous processes and  interspinous ligament were removed.  Starting on the patient's left side, a  high speed drill was used to perform a Gill procedure by removing the  inferior 1/2 of the L5 lamina, medial 1/3 of the facet joint and the  superior 1/2 of the S1 lamina.  Residual bone was removed and saved for use  later in the case.  The ligamentum flavum was removed  in a piecemeal  fashion.  A similar decompression was carried out on the opposite side in a  similar fashion, once again removing the inferior 1/2 of the L5 lamina,  medial 1/3 of the facet joint and the superior 1/2 of the S1 lamina.  Once  again, residual bone was removed and saved for use later in the case.  The  ligamentum flavum was removed in a piecemeal fashion.  Residual midline  structures were removed to complete the bilateral decompression.   At this time, microdissection technique was used to perform bilateral  microdiscectomies.  Thorough disc space clean out was carried out  bilaterally using microdissection technique.  Pituitary rongeurs and curets  were the primary instruments used.  At this time, inspection was carried out  in all directions for any evidence of residual compression and none could be  identified.  Posterior lumbar interbody fusion was then performed.  Starting  on the patient's left side, scraping was carried out followed by cutting  with a box chisel with a 10 mm size.  A 10 by 9 by 20 mm bony spacer  was  then placed.  On the opposite side, a similar procedure was carried out.  Prior to placing the second spacer, autologous bone graft was placed in the  midline.  On this side, a PEEK cage filled with the patient's own bone was  impacted without difficulty and fluoroscopy showed it to be in good  position.  Trans-facet screw fixation was then carried out.  Starting on the  patient's right side, I drilled over a guide-wire that was passed to the  inferior facet of L5 and into the pedicle of S1.  This was followed under  fluoroscopy in AP and lateral planes as well as palpation of the medial  pedicle and EMG monitoring, all of which showed no evidence of breach.  A 30  mm screw was then placed after tapping down the channel.  A similar  procedure was then carried out on the patient's left side. Once again,  I  drilled over a guide-wire that was passed to the  inferior facet of L5 and  into the pedicle of S1.  Once again, tapping was carried out.  This was all  followed under AP and lateral fluoroscopy and direct palpation and EMG  monitoring, all of which showed no evidence of breach.  Another 30 mm screw  was then placed.  At this point, the facet joint on the patient's right side  was decorticated and posterolateral fusion was performed with BMP and  autologous bone graft.  Final fluoroscopy in the AP and lateral direction  showed the spacers and screws to all be in good position.  Large amounts of  irrigation were carried out.  Any bleeding was controlled with bipolar  coagulation and Gelfoam.  The wound was then closed in multiple layers of  Vicryl in the muscle, fascia, subcutaneous tissue, and staples were placed  on the skin.  Sterile dressings were then applied and the patient was  extubated and taken to the recovery room in stable condition.           ______________________________  Reinaldo Meeker, M.D.     ROK/MEDQ  D:  05/27/2005  T:  05/27/2005  Job:  [id number]

## 2010-08-28 NOTE — Consult Note (Signed)
NAME:  Victoria Reyes, Victoria Reyes                         ACCOUNT NO.:  [id number]   MEDICAL RECORD NO.:  [id number]                   PATIENT TYPE:  INP   LOCATION:  IC04                                 FACILITY:  [facility]   PHYSICIAN:  Thomas C. Wall, M.D.                DATE OF BIRTH:  05-Jul-1950   DATE OF CONSULTATION:  01/07/2003  DATE OF DISCHARGE:                                   CONSULTATION   We were asked by Dr. Christen Butter to evaluate Victoria Reyes, a 60 year old  African-American female with epigastric discomfort.   She is a chronic smoker and has had a productive cough for the last several  days.  She began to have some ache around the subxiphoid process and came to  the emergency room.  She was admitted for rule out myocardial infarction.   Key laboratory data includes troponins that are borderline positive at 0.06,  0.08, but normal CPKs and MBs x3.  Her TSH was high at 18.28.  She is on  Synthroid replacement.  Her d-dimer was also borderline positive at 0.63.  The rest of her blood work was within normal limits except for white count  of 11.5 thousand.   Her EKG showed T-wave inversion anterior and lateral leads.  There was no  evidence of PR depression or ST segment changes otherwise.   Her cardiac risk factors include tobacco use.  Sister apparently has had  some heart problems in the past with two pacemakers inserted.  She also had  a heart murmur.  There is no evidence of coronary disease.  Her cholesterol  was 192, triglycerides 88, HDL 66, LDL 108, VLDL 18.   PAST MEDICAL HISTORY:  Her medications at home:  Sudafed and Robitussin on  admission.  Apparently she was not on any thyroid replacement.   She has no known drug allergies.   Her past medical history is also significant for I-131 treatment of thyroid  in 1993.  She has been noncompliant with her Levoxyl replacement.  She had  an umbilical and a hiatal hernia.  She has had a T&A.   SOCIAL HISTORY:  She  smokes one-half pack of cigarettes and has for the last  25 years.  She is widowed x1 year.  She drives a forklift.  She has three  children.   PHYSICAL EXAMINATION:  VITAL SIGNS:  Today showed a blood pressure of  127/78, pulse is 53 and sinus bradycardia, respiratory rate is 18,  temperature is 98.1, she has 100% O2 saturation.  Weight is 175.1.  HEENT:  Other than hyperpigmentation around both eyes it is unremarkable.  She has some mild exophthalmia.  NECK:  Her carotid upstrokes are equal bilaterally, without bruits.  There  is no JVD.  Thyroid is not enlarged.  Trachea is midline.  LUNGS:  Reveal inspiratory and expiratory wheezing.  There were no rales.  HEART:  Regular  rate and rhythm without murmur, gallop, or rub.  ABDOMEN:  Soft, good bowel sounds.  EXTREMITIES:  No edema.  There is no evidence of DVT.  Pulses were 2+/4+.   ASSESSMENT AND RECOMMENDATION:  Epigastric discomfort, probably from  exacerbation of chronic bronchitis with cough and productive sputum.  We  need to rule out pericardial effusion from hypothyroidism.  This could also  explain her slight elevation in troponins.  Her CPK and MBs are negative.  I  do not think this is coronary related.   RECOMMENDATIONS:  1. Thyroid replacement.  2. Treatment of chronic bronchitis as you are doing.  3. Cancel Cardiolite.  4. A 2-D echocardiogram to assess for pericardial effusion.  5. Discontinue smoking, which I discussed with the patient.   Thank you very much for the consultation.      ___________________________________________                                            Jesse Sans Wall, M.D.   TCW/MEDQ  D:  01/07/2003  T:  01/07/2003  Job:  [id number]   cc:   Dirk Dress. Katrinka Blazing, M.D.  [address]  [place]  [place] [address]  Fax: [phone]

## 2010-08-28 NOTE — Discharge Summary (Signed)
   NAME:  Victoria Reyes, Victoria Reyes                         ACCOUNT NO.:  [id number]   MEDICAL RECORD NO.:  [id number]                   PATIENT TYPE:  INP   LOCATION:  IC04                                 FACILITY:  [facility]   PHYSICIAN:  Hanley Hays. Dechurch, M.D.           DATE OF BIRTH:  1951-03-15   DATE OF ADMISSION:  01/05/2003  DATE OF DISCHARGE:  01/07/2003                                 DISCHARGE SUMMARY   DIAGNOSES:  1. Atypical chest pain.  2. Hypothyroidism, TSH of 18.  3. Bronchitis, viral, improving.   DISPOSITION:  The patient discharged to home. Synthroid 50 mcg daily. Pepcid  A.C. p.r.n. indigestion. The patient to follow for recurrent pain or return  of nausea. Followup TSH, T4 in six weeks. Follow up with Dr. Katrinka Blazing in six  weeks, sooner if need be.   HOSPITAL COURSE:  The patient is a pleasant 60 year old African-American  female followed by Dr. Katrinka Blazing with a history of I131 ablation who previously  had been on thyroid replacement therapy, but this continued because she felt  she was gaining weight. She presented to the emergency room complaining of  epigastric burning, nausea which persisted. Her troponins were 0.06 and 0.05  respectively, though her CKs were normal. She had some inverted T waves  through V2 through V5. An echocardiogram was performed after consultation by  cardiology. Echocardiogram revealed no significant findings, i.e. completely  normal. It was felt that Cardiolite stress was not indicated. Her symptoms  had resolved. She had no other findings. Her TSH was noted to 18.6. The  importance of treating hypothyroidism was discussed at length with the  patient who appears to understand. She is being discharged to home with a  regimen as noted above. In addition, cholesterol of 192 was obtained during  this hospital stay as was triglycerides of 88 and HDL of 66, LDL of 108.  Other laboratory data was within normal limits. She had no evidence of  pneumonia or  respiratory distress. Her lungs were clear to auscultation on  discharge day exam. Her chest x-ray revealed no evidence of pneumonia. She  did have somewhat of a productive cough but no fevers. Felt that she had a  resolving viral bronchitis. This was discussed with the patient as well. She  is discharged to home. She can return to work without restrictions on  September 28.      ___________________________________________                                         Hanley Hays. Josefine Class, M.D.   FED/MEDQ  D:  01/07/2003  T:  01/07/2003  Job:  [id number]

## 2010-09-03 ENCOUNTER — Other Ambulatory Visit: Payer: Self-pay

## 2010-09-03 MED ORDER — LEVOTHYROXINE SODIUM 100 MCG PO TABS: 100.0000 ug | ORAL_TABLET | Freq: Every day | ORAL | Status: DC

## 2010-09-09 ENCOUNTER — Telehealth: Payer: Self-pay

## 2010-09-09 NOTE — Telephone Encounter (Signed)
Deny, needs ov missed recent visit for cp, needs re-eval t

## 2010-09-09 NOTE — Telephone Encounter (Signed)
noted 

## 2010-09-22 ENCOUNTER — Telehealth: Payer: Self-pay | Admitting: Physician Assistant

## 2010-09-22 ENCOUNTER — Ambulatory Visit (INDEPENDENT_AMBULATORY_CARE_PROVIDER_SITE_OTHER): Payer: BC Managed Care – PPO | Admitting: Family Medicine

## 2010-09-22 ENCOUNTER — Encounter: Payer: Self-pay | Admitting: Physician Assistant

## 2010-09-22 ENCOUNTER — Encounter: Payer: Self-pay | Admitting: Family Medicine

## 2010-09-22 VITALS — BP 124/82 | HR 72 | Temp 98.8°F | Resp 16 | Ht 63.0 in | Wt 172.4 lb

## 2010-09-22 DIAGNOSIS — M545 Low back pain, unspecified: Secondary | ICD-10-CM

## 2010-09-22 DIAGNOSIS — E039 Hypothyroidism, unspecified: Secondary | ICD-10-CM

## 2010-09-22 DIAGNOSIS — L0390 Cellulitis, unspecified: Secondary | ICD-10-CM

## 2010-09-22 DIAGNOSIS — F17200 Nicotine dependence, unspecified, uncomplicated: Secondary | ICD-10-CM

## 2010-09-22 DIAGNOSIS — L0291 Cutaneous abscess, unspecified: Secondary | ICD-10-CM

## 2010-09-22 MED ORDER — DOXYCYCLINE HYCLATE 100 MG PO TABS: 100.0000 mg | ORAL_TABLET | Freq: Two times a day (BID) | ORAL | Status: AC

## 2010-09-22 MED ORDER — CEFTRIAXONE SODIUM 500 MG IJ SOLR
500.0000 mg | Freq: Once | INTRAMUSCULAR | Status: AC
  Administered 2010-09-22: 500 mg via INTRAMUSCULAR

## 2010-09-22 NOTE — Telephone Encounter (Signed)
Hit her right leg in the calf area and at first it looked like a small bump them it looked like a little blood clot under the skin and now she has bumps around the area and up around her knee and thigh and also on her arms and they itch bad. Wants to be seen but there are no openings. Worked her in for 1pm

## 2010-09-22 NOTE — Progress Notes (Signed)
  Subjective:    Patient ID: Victoria Reyes, female    DOB: 1950-10-01, 60 y.o.   MRN: [id number]  HPI Hit right leg on the job 1 week ago, the area has become red, painful and has blistered, there are also new bumps which are red on the forearms.No fever or chills. Otherwise she has no new complaints and has been well. No active attempt to qiut nicotine now  Review of Systems Denies recent fever or chills. Denies sinus pressure, nasal congestion, ear pain or sore throat. Denies chest congestion, productive cough or wheezing. Denies chest pains, palpitations, paroxysmal nocturnal dyspnea, orthopnea and leg swelling Denies abdominal pain, nausea, vomiting,diarrhea or constipation.  Denies rectal bleeding or change in bowel movement. Denies dysuria, frequency, hesitancy or incontinence. Denies joint pain, swelling and limitation in mobility. Denies headaches, seizure, numbness, or tingling. Denies depression, anxiety or insomnia.       Objective:   Physical Exam Patient alert and oriented and in no Cardiopulmonary distress.  HEENT: No facial asymmetry, EOMI, no sinus tenderness, TM's clear, Oropharynx pink and moist.  Neck supple no adenopathy.  Chest: Clear to auscultation bilaterally.decreased air entry  CVS: S1, S2 no murmurs, no S3.  ABD: Soft non tender. Bowel sounds normal.  Ext: No edema  MS: Adequate ROM spine, shoulders, hips and knees.  Skin:ulcer on right leg with surrounding erythema, approx 8 inch diameter. Erythematous lesions on both forearms also Psych: Good eye contact, normal affect. Memory intact not anxious or depressed appearing.  CNS: CN 2-12 intact, power, tone and sensation normal throughout.        Assessment & Plan:

## 2010-09-22 NOTE — Patient Instructions (Signed)
F/u as befoe.  You have skin infection , starting where you hit the right leg.If this worsens  Then you will need to call I will send you to the ED  You will get rocephin in the office today and med is sent to your pharmacy also  TdAp today also.  Work excuse today , return 09/28/2010

## 2010-09-23 NOTE — Assessment & Plan Note (Signed)
unchanged

## 2010-09-23 NOTE — Assessment & Plan Note (Signed)
Unchanged, counseled to quit 

## 2010-09-23 NOTE — Assessment & Plan Note (Signed)
Cellulitis involving anterior  Third of right leg , also on forearms, rocephin in office then antibiotics orally. Work excuse

## 2010-09-23 NOTE — Assessment & Plan Note (Signed)
Controlled, no change in medication  

## 2010-10-22 ENCOUNTER — Encounter: Payer: Self-pay | Admitting: Physician Assistant

## 2010-10-27 ENCOUNTER — Telehealth: Payer: Self-pay | Admitting: Physician Assistant

## 2010-10-28 NOTE — Telephone Encounter (Signed)
I checked [employer] and Epic and I cannot find it

## 2010-10-28 NOTE — Telephone Encounter (Signed)
Form completed and left in your box.  Pls type the note that I wrote about being out , I will sign

## 2010-10-28 NOTE — Telephone Encounter (Signed)
pls print last FMLA form and put in folder

## 2010-10-29 ENCOUNTER — Ambulatory Visit (INDEPENDENT_AMBULATORY_CARE_PROVIDER_SITE_OTHER): Payer: BC Managed Care – PPO | Admitting: Family Medicine

## 2010-10-29 ENCOUNTER — Encounter: Payer: Self-pay | Admitting: Family Medicine

## 2010-10-29 VITALS — BP 130/70 | HR 74 | Resp 16 | Ht 62.25 in | Wt 174.0 lb

## 2010-10-29 DIAGNOSIS — Z1211 Encounter for screening for malignant neoplasm of colon: Secondary | ICD-10-CM

## 2010-10-29 DIAGNOSIS — Z0000 Encounter for general adult medical examination without abnormal findings: Secondary | ICD-10-CM

## 2010-10-29 DIAGNOSIS — M545 Low back pain, unspecified: Secondary | ICD-10-CM

## 2010-10-29 DIAGNOSIS — R195 Other fecal abnormalities: Secondary | ICD-10-CM

## 2010-10-29 DIAGNOSIS — F17200 Nicotine dependence, unspecified, uncomplicated: Secondary | ICD-10-CM

## 2010-10-29 DIAGNOSIS — R7309 Other abnormal glucose: Secondary | ICD-10-CM

## 2010-10-29 DIAGNOSIS — E039 Hypothyroidism, unspecified: Secondary | ICD-10-CM

## 2010-10-29 LAB — POC HEMOCCULT BLD/STL (OFFICE/1-CARD/DIAGNOSTIC): Fecal Occult Blood, POC: POSITIVE

## 2010-10-29 NOTE — Patient Instructions (Addendum)
F/u in 4 months.  I will locate your most recent colonoscopy report and contact you as to whether or not you need a colonoscopy now HBA1C today and TSH today, and rept both in 4 months, non fasting  You need to lose weight. Please consider a lower calorie diet and regular exercise    A healthy diet is rich in fruit, vegetables and whole grains. Poultry fish, nuts and beans are a healthy choice for protein rather then red meat. A low sodium diet and drinking 64 ounces of water daily is generally recommended. Oils and sweet should be limited. Carbohydrates especially for those who are diabetic or overweight, should be limited to 34-45 gram per meal. It is important to eat on a regular schedule, at least 3 times daily. Snacks should be primarily fruits, vegetables or nuts. It is important that you exercise regularly at least 30 minutes 5 times a week. If you develop chest pain, have severe difficulty breathing, or feel very tired, stop exercising immediately and seek medical attention `

## 2010-10-30 ENCOUNTER — Telehealth: Payer: Self-pay | Admitting: Physician Assistant

## 2010-10-30 NOTE — Telephone Encounter (Signed)
Noted, advise her to drink a lot of water before going for lab, this may help , sorry to hear

## 2010-10-30 NOTE — Telephone Encounter (Signed)
CALLED PATIENT, LEFT MESSAGE.  

## 2010-11-01 DIAGNOSIS — R195 Other fecal abnormalities: Secondary | ICD-10-CM | POA: Insufficient documentation

## 2010-11-01 NOTE — Assessment & Plan Note (Signed)
Occult blood in stool, unable to locate a colonoscopy though pt states she has had one, abd ct scan alludes to diverticulosis, will refer for colonoscopy, she needs to be advised of this

## 2010-11-01 NOTE — Assessment & Plan Note (Signed)
Unchanged, no commitment to quitting at this time

## 2010-11-01 NOTE — Assessment & Plan Note (Signed)
Controlled , will need updated labs by October

## 2010-11-01 NOTE — Assessment & Plan Note (Signed)
Unchanged, severe, however pt has the attitude that this will not stop her from work or doing whatever she wishes, which is great

## 2010-11-01 NOTE — Progress Notes (Signed)
  Subjective:    Patient ID: Victoria Reyes, female    DOB: 1951/03/20, 60 y.o.   MRN: [id number]  HPI The PT is here for annual exam  and re-evaluation of chronic medical conditions, medication management and review of recent lab and radiology data.  Preventive health is updated, specifically  Cancer screening, Osteoporosis screening and Immunization.   She continues to experience low back pain with weakness and pain in the left lower extremity and reports that the toes of her left leg remain frozen and unable to move, this ever since her back surgery which , in her words, took her life away. 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 Denies sinus pressure, nasal congestion, ear pain or sore throat. Denies chest congestion, productive cough or wheezing. Denies chest pains, palpitations, paroxysmal nocturnal dyspnea, orthopnea and leg swelling Denies abdominal pain, nausea, vomiting,diarrhea or constipation.  Denies rectal bleeding or change in bowel movement. Denies dysuria, frequency, hesitancy or incontinence.  Denies headaches, seizure, numbness, or tingling. Denies depression, anxiety or insomnia. Denies skin break down or rash.        Objective:   Physical Exam Pleasant well nourished female, alert and oriented x 3, in no cardio-pulmonary distress. Afebrile. HEENT No facial trauma or asymetry.   EOMI, PERTL, fundoscopic exam is normal, no hemorhage or exudate. No papiledema External ears normal, tympanic membranes clear. Oropharynx moist, no exudate, good dentition. Neck: supple, no adenopathy,JVD or thyromegaly.No bruits.  Chest: Clear to ascultation bilaterally.No crackles or wheezes. Non tender to palpation  Breast: No asymetry,no masses. No nipple  discharge or inversion. No axillary or supraclavicular adenopathy  Cardiovascular system; Heart sounds normal,  S1 and  S2 ,no S3.  No murmur, or thrill. Apical beat not displaced Peripheral pulses normal.  Abdomen: Soft, non tender, no organomegaly or masses. No bruits. Bowel sounds normal. No guarding, tenderness or rebound.  Rectal:  No mass. guaic positive  stool.  GU: Deferred until 2013.  Musculoskeletal exam: Decreased ROM of spine,adequate in hips , shoulders and knees. No deformity ,swelling or crepitus noted. No muscle wasting or atrophy.   Neurologic: Cranial nerves 2 to 12 intact. Power,,sensation and reflexes decreased in left lower extremity Mild disturbance of gait due to left lower extremity weakness. No tremor.  Skin: Intact, no ulceration, erythema , scaling or rash noted. Pigmentation normal throughout  Psych; Normal mood and affect. Judgement and concentration normal        Assessment & Plan:

## 2010-11-02 NOTE — Telephone Encounter (Signed)
Called patient, no answer 

## 2010-11-11 ENCOUNTER — Ambulatory Visit: Payer: BC Managed Care – PPO | Admitting: Gastroenterology

## 2010-11-23 ENCOUNTER — Encounter: Payer: Self-pay | Admitting: Gastroenterology

## 2010-11-23 ENCOUNTER — Ambulatory Visit (INDEPENDENT_AMBULATORY_CARE_PROVIDER_SITE_OTHER): Payer: BC Managed Care – PPO | Admitting: Gastroenterology

## 2010-11-23 VITALS — BP 114/64 | HR 81 | Temp 97.5°F | Ht 62.0 in | Wt 173.0 lb

## 2010-11-23 DIAGNOSIS — R195 Other fecal abnormalities: Secondary | ICD-10-CM

## 2010-11-23 NOTE — Progress Notes (Signed)
Referring Provider: Melody Comas, PA Primary Care Physician:  Melody Comas, PA, PA-C Primary Gastroenterologist: Dr. Darrick Penna   Chief Complaint  Patient presents with  . Rectal Bleeding  . Colonoscopy    HPI:   Victoria Reyes is a pleasant 60 year old female who presents today at the request of Dr. Lodema Hong secondary to heme + stool. Last colonoscopy was performed in 2005 by Dr. Katrinka Blazing: sigmoid diverticulosis. She presents today without any complaints. Denies abdominal pain, lack of appetite, constipation, or diarrhea. No N/V. She states her highest weight was 204; she is now 173. In 2010, when she was last seen by [place], she was 190. States she doesn't cook anymore, and eats smaller portions.  Rare reflux r/t certain foods. Not routine, and she doesn't take anything normally.   Past Medical History  Diagnosis Date  . Thyroid disease   . Diverticulitis 2005    CT   . Obesity   . Knee pain, right   . Neuropathy     peroneal nerve  . Hyperlipidemia   . Low back pain   . Tobacco abuse   . DJD (degenerative joint disease)   . Hiatal hernia   . S/P colonoscopy 2005    Dr. Katrinka Blazing: sigmoid diverticulosis    Past Surgical History  Procedure Date  . Tubal ligation   . Tonsillectomy   . Umbillical hernia   . Cyst removal right wrist   . Back surgery     Current Outpatient Prescriptions  Medication Sig Dispense Refill  . Cholecalciferol (VITAMIN D) 1000 UNITS capsule Take 1,000 Units by mouth daily.        Marland Kitchen doxepin (SINEQUAN) 25 MG capsule Take 25 mg by mouth.        . levothyroxine (SYNTHROID) 100 MCG tablet Take 1 tablet (100 mcg total) by mouth daily.  30 tablet  5  . omeprazole (PRILOSEC) 20 MG capsule Take 20 mg by mouth as needed.        . furosemide (LASIX) 20 MG tablet Take 20 mg by mouth 2 (two) times daily.        Marland Kitchen ketoconazole (NIZORAL) 2 % cream Apply topically daily.        . metFORMIN (GLUCOPHAGE) 500 MG tablet Take 1 tablet (500 mg total) by mouth daily with  breakfast.  30 tablet  2  . simvastatin (ZOCOR) 40 MG tablet Take 40 mg by mouth at bedtime.        . traMADol (ULTRAM) 50 MG tablet Take 50 mg by mouth every 6 (six) hours as needed.          Allergies as of 11/23/2010 - Review Complete 11/23/2010  Allergen Reaction Noted  . Hydrocodone  06/14/2006    Family History  Problem Relation Age of Onset  . Colon cancer Neg Hx     History   Social History  . Marital Status: Widowed    Spouse Name: N/A    Number of Children: N/A  . Years of Education: N/A   Social History Main Topics  . Smoking status: Current Everyday Smoker -- 0.5 packs/day  . Smokeless tobacco: Not on file  . Alcohol Use: Not on file  . Drug Use: Not on file  . Sexually Active: Not on file   Other Topics Concern  . Not on file          Review of Systems: Gen: Denies fever, chills, anorexia. Denies fatigue, weakness, weight loss.  CV: Denies chest pain, palpitations, syncope, peripheral edema,  and claudication. Resp: Denies dyspnea at rest, cough, wheezing, coughing up blood, and pleurisy. GI: Denies vomiting blood, jaundice, and fecal incontinence.   Denies dysphagia or odynophagia. Derm: Denies rash, itching, dry skin Psych: Denies depression, anxiety, memory loss, confusion. No homicidal or suicidal ideation.  Heme: Denies bruising, bleeding, and enlarged lymph nodes.  Physical Exam: BP 114/64  Pulse 81  Temp(Src) 97.5 F (36.4 C) (Temporal)  Ht 5\' 2"  (1.575 m)  Wt 173 lb (78.472 kg)  BMI 31.64 kg/m2 General:   Alert and oriented. No distress noted. Pleasant and cooperative.  Head:  Normocephalic and atraumatic. Eyes:  Conjuctiva clear without scleral icterus. Mouth:  Oral mucosa pink and moist. Good dentition. No lesions. Neck:  Supple, without mass or thyromegaly. Heart:  S1, S2 present without murmurs, rubs, or gallops. Regular rate and rhythm. Abdomen:  +BS, soft, non-tender and non-distended. No rebound or guarding. No HSM or masses  noted. Msk:  Symmetrical without gross deformities. Normal posture. Extremities:  Without edema. Neurologic:  Alert and  oriented x4;  grossly normal neurologically. Skin:  Intact without significant lesions or rashes. Cervical Nodes:  No significant cervical adenopathy. Psych:  Alert and cooperative. Normal mood and affect.

## 2010-11-23 NOTE — Patient Instructions (Signed)
We have set you up for a colonoscopy with Dr. Darrick Penna. We will provide further recommendations after this is completed.

## 2010-11-25 ENCOUNTER — Telehealth: Payer: Self-pay | Admitting: Family Medicine

## 2010-11-25 ENCOUNTER — Encounter: Payer: Self-pay | Admitting: Gastroenterology

## 2010-11-25 DIAGNOSIS — R195 Other fecal abnormalities: Secondary | ICD-10-CM | POA: Insufficient documentation

## 2010-11-25 MED ORDER — METFORMIN HCL 500 MG PO TABS: 500.0000 mg | ORAL_TABLET | Freq: Every day | ORAL | Status: DC

## 2010-11-25 NOTE — Telephone Encounter (Signed)
New med sent and patient coming Friday evening for diabetic teaching

## 2010-11-25 NOTE — Telephone Encounter (Signed)
pls contact pt , she is now diabetic based on HBA1C, pls give her the result, she needs to go to class, test once daily, needs test supplies sent in and erx metformin 500mg  one daily #30 refill 4.  NEEDS appt with me in 6 weeks with her info, pls let me know if you think she needs to come in to be seen to discuss the new dx with a doc

## 2010-11-25 NOTE — Assessment & Plan Note (Signed)
60 year old female with incidental finding of heme+ stool, last colonoscopy in 2005 with Dr. Katrinka Blazing. Devoid of any lower GI symptoms. Only rare reflux r/t certain types of food. Likely due to benign anorectal source, but we will proceed with colonoscopy and provide further recommendations as appropriate.  Proceed with colonoscopy with Dr. Darrick Penna in the near future. The risks, benefits, and alternatives have been discussed in detail with the patient. They state understanding and desire to proceed.

## 2010-11-25 NOTE — Telephone Encounter (Signed)
Called patient, left message.

## 2010-11-25 NOTE — Telephone Encounter (Signed)
Addended by: Adella Hare B on: 11/25/2010 03:52 PM   Modules accepted: Orders

## 2010-11-26 NOTE — Progress Notes (Signed)
Cc to PCP 

## 2010-11-27 ENCOUNTER — Other Ambulatory Visit: Payer: Self-pay

## 2010-11-27 ENCOUNTER — Ambulatory Visit (INDEPENDENT_AMBULATORY_CARE_PROVIDER_SITE_OTHER): Payer: BC Managed Care – PPO | Admitting: Family Medicine

## 2010-11-27 DIAGNOSIS — E119 Type 2 diabetes mellitus without complications: Secondary | ICD-10-CM

## 2010-11-27 MED ORDER — VITAMIN D 1000 UNITS PO CAPS: 1000.0000 [IU] | ORAL_CAPSULE | Freq: Every day | ORAL | Status: DC

## 2010-11-27 MED ORDER — FREESTYLE LANCETS MISC: Status: AC

## 2010-11-27 MED ORDER — GLUCOSE BLOOD VI STRP: ORAL_STRIP | Status: AC

## 2010-11-27 NOTE — Progress Notes (Signed)
Spoke briefly with patient (she was in a hurry) regarding how to test her sugar and what the ranges should be. She was given handouts on testing and nutrition and was given a freestyle meter. I demonstrated it to her in the office and tried to get her to do it herself but she did not want to. Advised to record her fasting sugars and bring them to her next OV. Testing supplies sent in

## 2010-11-30 ENCOUNTER — Other Ambulatory Visit: Payer: Self-pay | Admitting: Family Medicine

## 2010-12-08 ENCOUNTER — Telehealth: Payer: Self-pay | Admitting: Gastroenterology

## 2010-12-08 NOTE — Telephone Encounter (Signed)
Pt called to let us know that her PCP just put her on Metformin 500mg  once a day. She takes this in the morning. Pt is scheduled for TCS on 12/18/10- Please advise if we need to adjust her med.

## 2010-12-08 NOTE — Telephone Encounter (Signed)
Shouldn't make a difference, but she can hold that am dose and resume after procedure.

## 2010-12-16 NOTE — Progress Notes (Signed)
agree

## 2010-12-17 MED ORDER — SODIUM CHLORIDE 0.45 % IV SOLN
Freq: Once | INTRAVENOUS | Status: AC
  Administered 2010-12-18: 10:00:00 via INTRAVENOUS

## 2010-12-18 ENCOUNTER — Ambulatory Visit (HOSPITAL_COMMUNITY)
Admission: RE | Admit: 2010-12-18 | Discharge: 2010-12-18 | Disposition: A | Discharge status: Home / Self Care | Payer: BC Managed Care – PPO | Source: Ambulatory Visit | Attending: Gastroenterology | Admitting: Gastroenterology

## 2010-12-18 ENCOUNTER — Other Ambulatory Visit: Payer: Self-pay | Admitting: Gastroenterology

## 2010-12-18 ENCOUNTER — Encounter (HOSPITAL_COMMUNITY)
Admission: RE | Disposition: A | Discharge status: Home / Self Care | Payer: Self-pay | Source: Ambulatory Visit | Attending: Gastroenterology

## 2010-12-18 ENCOUNTER — Encounter (HOSPITAL_COMMUNITY): Payer: Self-pay | Admitting: *Deleted

## 2010-12-18 DIAGNOSIS — K648 Other hemorrhoids: Secondary | ICD-10-CM | POA: Insufficient documentation

## 2010-12-18 DIAGNOSIS — D126 Benign neoplasm of colon, unspecified: Secondary | ICD-10-CM | POA: Insufficient documentation

## 2010-12-18 DIAGNOSIS — K5730 Diverticulosis of large intestine without perforation or abscess without bleeding: Secondary | ICD-10-CM

## 2010-12-18 DIAGNOSIS — K921 Melena: Secondary | ICD-10-CM | POA: Insufficient documentation

## 2010-12-18 DIAGNOSIS — Z01812 Encounter for preprocedural laboratory examination: Secondary | ICD-10-CM | POA: Insufficient documentation

## 2010-12-18 DIAGNOSIS — R195 Other fecal abnormalities: Secondary | ICD-10-CM

## 2010-12-18 HISTORY — DX: Headache: R51

## 2010-12-18 HISTORY — PX: COLONOSCOPY: SHX5424

## 2010-12-18 HISTORY — DX: Hypothyroidism, unspecified: E03.9

## 2010-12-18 HISTORY — DX: Gastro-esophageal reflux disease without esophagitis: K21.9

## 2010-12-18 LAB — GLUCOSE, CAPILLARY: Glucose-Capillary: 96 mg/dL (ref 70–99)

## 2010-12-18 SURGERY — COLONOSCOPY
Anesthesia: Moderate Sedation

## 2010-12-18 MED ORDER — STERILE WATER FOR IRRIGATION IR SOLN
Status: DC | PRN
  Administered 2010-12-18: 10:00:00

## 2010-12-18 MED ORDER — MIDAZOLAM HCL 5 MG/5ML IJ SOLN
INTRAMUSCULAR | Status: AC
  Filled 2010-12-18: qty 10

## 2010-12-18 MED ORDER — MEPERIDINE HCL 100 MG/ML IJ SOLN
INTRAMUSCULAR | Status: AC
  Filled 2010-12-18: qty 2

## 2010-12-18 MED ORDER — MIDAZOLAM HCL 5 MG/5ML IJ SOLN
INTRAMUSCULAR | Status: AC
  Filled 2010-12-18: qty 5

## 2010-12-18 MED ORDER — MEPERIDINE HCL 100 MG/ML IJ SOLN
INTRAMUSCULAR | Status: DC | PRN
  Administered 2010-12-18: 50 mg via INTRAVENOUS
  Administered 2010-12-18: 25 mg via INTRAVENOUS

## 2010-12-18 MED ORDER — MIDAZOLAM HCL 5 MG/5ML IJ SOLN
INTRAMUSCULAR | Status: DC | PRN
  Administered 2010-12-18: 1 mg via INTRAVENOUS
  Administered 2010-12-18 (×2): 2 mg via INTRAVENOUS

## 2010-12-18 NOTE — H&P (Signed)
  In epic 8/13

## 2010-12-18 NOTE — Interval H&P Note (Signed)
History and Physical Interval Note:   12/18/2010   9:16 AM   Victoria Reyes  has presented today for surgery, with the diagnosis of heme + stool  The various methods of treatment have been discussed with the patient and family. After consideration of risks, benefits and other options for treatment, the patient has consented to  Procedure(s): COLONOSCOPY as a surgical intervention .  I have reviewed the patients' chart and labs.  Questions were answered to the patient's satisfaction.     [name]  MD

## 2010-12-22 ENCOUNTER — Telehealth: Payer: Self-pay | Admitting: Gastroenterology

## 2010-12-22 NOTE — Telephone Encounter (Signed)
Please call pt. She had a simple adenoma removed from her colon. TCS in 10 years. High fiber diet. Her anal biopsy showed benign tissue.

## 2010-12-22 NOTE — Telephone Encounter (Signed)
Pt informed

## 2010-12-22 NOTE — Telephone Encounter (Signed)
LMOM to call.

## 2010-12-22 NOTE — Telephone Encounter (Signed)
Results Cc to PCP  

## 2010-12-27 ENCOUNTER — Telehealth: Payer: Self-pay | Admitting: Family Medicine

## 2010-12-27 DIAGNOSIS — E559 Vitamin D deficiency, unspecified: Secondary | ICD-10-CM

## 2010-12-27 NOTE — Telephone Encounter (Signed)
pls add a vit D level to labs she is to have in November, you had a question about refilling vit D, no value since 2011, just fax in additional order to the lab please, no need to contact pt

## 2010-12-29 ENCOUNTER — Encounter (HOSPITAL_COMMUNITY): Payer: Self-pay | Admitting: Gastroenterology

## 2010-12-29 NOTE — Telephone Encounter (Signed)
Order faxed to lab with message to do in Nov with other labs

## 2010-12-29 NOTE — Telephone Encounter (Signed)
Reminder in epic to have tcs in 10 years 

## 2011-01-07 LAB — DIFFERENTIAL
Eosinophils Absolute: 0.3
Lymphocytes Relative: 40
Lymphs Abs: 4.8 — ABNORMAL HIGH
Monocytes Relative: 7
Neutrophils Relative %: 49

## 2011-01-07 LAB — BASIC METABOLIC PANEL
Chloride: 109
Creatinine, Ser: 0.83
GFR calc Af Amer: >60
GFR calc non Af Amer: >60
Potassium: 4.6

## 2011-01-07 LAB — POCT CARDIAC MARKERS
CKMB, poc: <1 — ABNORMAL LOW
Myoglobin, poc: 25.1
Operator id: 166561
Troponin i, poc: <0.05

## 2011-01-07 LAB — CBC
MCV: 87.8
RBC: 4.64
WBC: 12 — ABNORMAL HIGH

## 2011-02-18 ENCOUNTER — Other Ambulatory Visit: Payer: Self-pay | Admitting: Family Medicine

## 2011-02-19 ENCOUNTER — Telehealth: Payer: Self-pay | Admitting: Family Medicine

## 2011-02-19 NOTE — Telephone Encounter (Signed)
Head and chest congestion, no fever or chills, , advised mucinex , tylenol, and fluids and rest

## 2011-02-22 ENCOUNTER — Telehealth: Payer: Self-pay | Admitting: Physician Assistant

## 2011-02-23 ENCOUNTER — Other Ambulatory Visit: Payer: Self-pay | Admitting: Family Medicine

## 2011-02-23 MED ORDER — OMEPRAZOLE 40 MG PO CPDR: 40.0000 mg | DELAYED_RELEASE_CAPSULE | Freq: Every day | ORAL | Status: DC

## 2011-02-23 NOTE — Telephone Encounter (Signed)
Was upset nobody called her back yesterday so she walked in today and stated she had diverticulosis and she was in pain and needed something sent to the pharmacy and she looked upset. I told her I would send you the message now and put a note on your desk.

## 2011-02-23 NOTE — Telephone Encounter (Signed)
C/o burning epigastric pain, though she called it diverticulosis, will send in PPI, she is aware

## 2011-03-01 ENCOUNTER — Encounter: Payer: Self-pay | Admitting: Physician Assistant

## 2011-03-06 LAB — HEMOGLOBIN A1C: Hgb A1c MFr Bld: 5.8 % — ABNORMAL HIGH (ref <5.7)

## 2011-03-08 ENCOUNTER — Other Ambulatory Visit: Payer: Self-pay | Admitting: Family Medicine

## 2011-03-08 ENCOUNTER — Ambulatory Visit (INDEPENDENT_AMBULATORY_CARE_PROVIDER_SITE_OTHER): Payer: BC Managed Care – PPO | Admitting: Family Medicine

## 2011-03-08 VITALS — BP 140/64 | HR 80 | Resp 16 | Ht 62.25 in | Wt 173.1 lb

## 2011-03-08 DIAGNOSIS — F17200 Nicotine dependence, unspecified, uncomplicated: Secondary | ICD-10-CM

## 2011-03-08 DIAGNOSIS — K219 Gastro-esophageal reflux disease without esophagitis: Secondary | ICD-10-CM

## 2011-03-08 DIAGNOSIS — Z139 Encounter for screening, unspecified: Secondary | ICD-10-CM

## 2011-03-08 DIAGNOSIS — E669 Obesity, unspecified: Secondary | ICD-10-CM

## 2011-03-08 DIAGNOSIS — R5383 Other fatigue: Secondary | ICD-10-CM

## 2011-03-08 DIAGNOSIS — E119 Type 2 diabetes mellitus without complications: Secondary | ICD-10-CM

## 2011-03-08 DIAGNOSIS — R7303 Prediabetes: Secondary | ICD-10-CM

## 2011-03-08 DIAGNOSIS — M545 Low back pain, unspecified: Secondary | ICD-10-CM

## 2011-03-08 DIAGNOSIS — E039 Hypothyroidism, unspecified: Secondary | ICD-10-CM

## 2011-03-08 DIAGNOSIS — M5430 Sciatica, unspecified side: Secondary | ICD-10-CM

## 2011-03-08 DIAGNOSIS — R7309 Other abnormal glucose: Secondary | ICD-10-CM

## 2011-03-08 DIAGNOSIS — R5381 Other malaise: Secondary | ICD-10-CM

## 2011-03-08 DIAGNOSIS — E782 Mixed hyperlipidemia: Secondary | ICD-10-CM

## 2011-03-08 DIAGNOSIS — E785 Hyperlipidemia, unspecified: Secondary | ICD-10-CM

## 2011-03-08 MED ORDER — KETOROLAC TROMETHAMINE 60 MG/2ML IM SOLN: 60.0000 mg | Freq: Once | INTRAMUSCULAR | Status: DC

## 2011-03-08 NOTE — Patient Instructions (Addendum)
cPE April 2013  You need to stop smoking. Two packs per week for dec, then 1 pack per week for January,, then half pack per week for  You need to quit smoking.two packs of cig per February, then stop in March   hBA1C , cmp and eGFR , tSH fasting in April  Reduce thyroid dose to hALF daily.  rept tSH mid January please  toradol 60mg  iM today for back pain radiaiting down left leg  Mammogram is past due we will  schedule

## 2011-03-08 NOTE — Progress Notes (Signed)
  Subjective:    Patient ID: Victoria Reyes, female    DOB: 1950-11-06, 60 y.o.   MRN: [id number]  HPI The PT is here for follow up and re-evaluation of chronic medical conditions, medication management and review of any available recent lab and radiology data.  Preventive health is updated, specifically  Cancer screening and Immunization.   Questions or concerns regarding consultations or procedures which the PT has had in the interim are  addressed. The PT denies any adverse reactions to current medications since the last visit.  Since her last visit she has been dx with diabetes/prediabetes, is on metformin which she is tolerating well. Has her blood sugary diary, she is faithful  With daily testing , and fasting sugars are seldom over 105. Reports resolution of epigastric pain with omeprazole. Continues to experience daily severe back pain    Review of Systems See HPI Denies recent fever or chills. Denies sinus pressure, nasal congestion, ear pain or sore throat. Denies chest congestion, productive cough or wheezing. Denies chest pains, palpitations and leg swelling Denies abdominal pain, nausea, vomiting,diarrhea or constipation.   Denies dysuria, frequency, hesitancy or incontinence.  Denies headaches, seizures, numbness, or tingling. Denies depression, anxiety or insomnia. Denies skin break down or rash.        Objective:   Physical Exam  Patient alert and oriented and in no cardiopulmonary distress.  HEENT: No facial asymmetry, EOMI, no sinus tenderness,  oropharynx pink and moist.  Neck supple no adenopathy.  Chest: Clear to auscultation bilaterally.Decreased  Throughout.  CVS: S1, S2 no murmurs, no S3.  ABD: Soft non tender. Bowel sounds normal.  Ext: No edema  MS: decreased  ROM spine,adequate in  shoulders, hips and knees.  Skin: Intact, no ulcerations or rash noted.  Psych: Good eye contact, normal affect. Memory intact not anxious or depressed  appearing.  CNS: CN 2-12 intact, power, tone and sensation normal throughout.       Assessment & Plan:

## 2011-03-09 MED ORDER — KETOROLAC TROMETHAMINE 60 MG/2ML IJ SOLN
60.0000 mg | Freq: Once | INTRAMUSCULAR | Status: AC
  Administered 2011-03-09: 60 mg via INTRAMUSCULAR

## 2011-03-14 ENCOUNTER — Encounter: Payer: Self-pay | Admitting: Family Medicine

## 2011-03-14 DIAGNOSIS — K219 Gastro-esophageal reflux disease without esophagitis: Secondary | ICD-10-CM | POA: Insufficient documentation

## 2011-03-14 DIAGNOSIS — R7302 Impaired glucose tolerance (oral): Secondary | ICD-10-CM | POA: Insufficient documentation

## 2011-03-14 NOTE — Assessment & Plan Note (Signed)
Hyperlipidemia:Low fat diet discussed and encouraged.  Continue current med , lab prior to next visit

## 2011-03-14 NOTE — Assessment & Plan Note (Signed)
Unchanged. Patient re-educated about  the importance of commitment to a  minimum of 150 minutes of exercise per week. The importance of healthy food choices with portion control discussed. Encouraged to start a food diary, count calories and to consider  joining a support group. Sample diet sheets offered. Goals set by the patient for the next several months.    

## 2011-03-14 NOTE — Assessment & Plan Note (Signed)
Reports fasting sugars seldom over 105 and tolerating metformin, also dietary modification with carbohydrate reduction has been started

## 2011-03-14 NOTE — Assessment & Plan Note (Signed)
Overcorrected based on most recent lab data, dose reduction

## 2011-03-14 NOTE — Assessment & Plan Note (Signed)
Chronic daily pain worse at end of day, trial of tramadol

## 2011-03-14 NOTE — Assessment & Plan Note (Signed)
Marked improvement on medication

## 2011-03-14 NOTE — Assessment & Plan Note (Signed)
Unchanged, cessation counseling done, pt to begin to taper off with a view to quitting

## 2011-03-15 ENCOUNTER — Ambulatory Visit (HOSPITAL_COMMUNITY)
Admission: RE | Admit: 2011-03-15 | Discharge: 2011-03-15 | Disposition: A | Discharge status: Home / Self Care | Payer: BC Managed Care – PPO | Source: Ambulatory Visit | Attending: Family Medicine | Admitting: Family Medicine

## 2011-03-15 DIAGNOSIS — Z1231 Encounter for screening mammogram for malignant neoplasm of breast: Secondary | ICD-10-CM | POA: Insufficient documentation

## 2011-03-15 DIAGNOSIS — Z139 Encounter for screening, unspecified: Secondary | ICD-10-CM

## 2011-03-21 ENCOUNTER — Other Ambulatory Visit: Payer: Self-pay | Admitting: Family Medicine

## 2011-07-16 ENCOUNTER — Other Ambulatory Visit: Payer: Self-pay | Admitting: Family Medicine

## 2011-07-17 LAB — COMPLETE METABOLIC PANEL WITH GFR
AST: 18 U/L (ref 0–37)
Alkaline Phosphatase: 112 U/L (ref 39–117)
BUN: 14 mg/dL (ref 6–23)
Creat: 0.75 mg/dL (ref 0.50–1.10)
GFR, Est Non African American: 86 mL/min
Potassium: 4.5 mEq/L (ref 3.5–5.3)
Total Bilirubin: 0.5 mg/dL (ref 0.3–1.2)

## 2011-07-17 LAB — TSH: TSH: 4.605 u[IU]/mL — ABNORMAL HIGH (ref 0.350–4.500)

## 2011-07-17 LAB — HEMOGLOBIN A1C: Hgb A1c MFr Bld: 5.9 % — ABNORMAL HIGH (ref <5.7)

## 2011-07-20 ENCOUNTER — Ambulatory Visit (INDEPENDENT_AMBULATORY_CARE_PROVIDER_SITE_OTHER): Payer: BC Managed Care – PPO | Admitting: Family Medicine

## 2011-07-20 ENCOUNTER — Ambulatory Visit (HOSPITAL_COMMUNITY)
Admission: RE | Admit: 2011-07-20 | Discharge: 2011-07-20 | Disposition: A | Discharge status: Home / Self Care | Payer: BC Managed Care – PPO | Source: Ambulatory Visit | Attending: Family Medicine | Admitting: Family Medicine

## 2011-07-20 VITALS — BP 98/62 | HR 86 | Resp 16 | Ht 62.5 in | Wt 177.8 lb

## 2011-07-20 DIAGNOSIS — R059 Cough, unspecified: Secondary | ICD-10-CM | POA: Insufficient documentation

## 2011-07-20 DIAGNOSIS — E039 Hypothyroidism, unspecified: Secondary | ICD-10-CM

## 2011-07-20 DIAGNOSIS — J209 Acute bronchitis, unspecified: Secondary | ICD-10-CM

## 2011-07-20 DIAGNOSIS — R5381 Other malaise: Secondary | ICD-10-CM

## 2011-07-20 DIAGNOSIS — R5383 Other fatigue: Secondary | ICD-10-CM

## 2011-07-20 DIAGNOSIS — R05 Cough: Secondary | ICD-10-CM | POA: Insufficient documentation

## 2011-07-20 DIAGNOSIS — J019 Acute sinusitis, unspecified: Secondary | ICD-10-CM

## 2011-07-20 DIAGNOSIS — E119 Type 2 diabetes mellitus without complications: Secondary | ICD-10-CM

## 2011-07-20 DIAGNOSIS — E782 Mixed hyperlipidemia: Secondary | ICD-10-CM

## 2011-07-20 DIAGNOSIS — R0989 Other specified symptoms and signs involving the circulatory and respiratory systems: Secondary | ICD-10-CM | POA: Insufficient documentation

## 2011-07-20 MED ORDER — PREDNISONE (PAK) 5 MG PO TABS: 5.0000 mg | ORAL_TABLET | ORAL | Status: DC

## 2011-07-20 MED ORDER — SULFAMETHOXAZOLE-TRIMETHOPRIM 800-160 MG PO TABS: 1.0000 | ORAL_TABLET | Freq: Two times a day (BID) | ORAL | Status: AC

## 2011-07-20 MED ORDER — BENZONATATE 100 MG PO CAPS: 100.0000 mg | ORAL_CAPSULE | Freq: Four times a day (QID) | ORAL | Status: DC | PRN

## 2011-07-20 MED ORDER — IPRATROPIUM BROMIDE 0.02 % IN SOLN
0.5000 mg | Freq: Once | RESPIRATORY_TRACT | Status: AC
  Administered 2011-07-20: 0.5 mg via RESPIRATORY_TRACT

## 2011-07-20 MED ORDER — CEFTRIAXONE SODIUM 500 MG IJ SOLR
500.0000 mg | Freq: Once | INTRAMUSCULAR | Status: AC
  Administered 2011-07-20: 500 mg via INTRAMUSCULAR

## 2011-07-20 MED ORDER — ALBUTEROL SULFATE (2.5 MG/3ML) 0.083% IN NEBU
2.5000 mg | INHALATION_SOLUTION | Freq: Once | RESPIRATORY_TRACT | Status: AC
  Administered 2011-07-20: 2.5 mg via RESPIRATORY_TRACT

## 2011-07-20 MED ORDER — METHYLPREDNISOLONE ACETATE 80 MG/ML IJ SUSP
80.0000 mg | Freq: Once | INTRAMUSCULAR | Status: AC
  Administered 2011-07-20: 80 mg via INTRAMUSCULAR

## 2011-07-20 NOTE — Progress Notes (Signed)
  Subjective:    Patient ID: Victoria Reyes, female    DOB: 07/25/1950, 61 y.o.   MRN: [id number]  HPI 5 day h/o incrased head and chest congestion, yellow spputum and nasal drainage, fever,and chills. Poor sleep due to cough and fatigue. Continues to smoke, no quit date set. Blood sugars have been running within normal range , though slightly elevated over the past several days   Review of Systems See HPI Denies chest pains, palpitations and leg swelling Denies abdominal pain, nausea, vomiting,diarrhea or constipation.   Denies dysuria, frequency, hesitancy or incontinence. Denies joint pain, swelling and limitation in mobility. Denies headaches, seizures, numbness, or tingling. Denies depression, anxiety or insomnia. Denies skin break down or rash.        Objective:   Physical Exam Patient alert and oriented and in no cardiopulmonary distress.  HEENT: No facial asymmetry, EOMI, maxillary  sinus tenderness,  oropharynx pink and moist.  Neck supple , anterior cervical adenopathy.  Chest: decreased air entry with  Bilateral crackles and wheezes CVS: S1, S2 no murmurs, no S3.  ABD: Soft non tender. Bowel sounds normal.  Ext: No edema  MS: Adequate ROM spine, shoulders, hips and knees.  Skin: Intact, no ulcerations or rash noted.  Psych: Good eye contact, normal affect. Memory intact not anxious or depressed appearing.  CNS: CN 2-12 intact, power, tone and sensation normal throughout.        Assessment & Plan:

## 2011-07-20 NOTE — Patient Instructions (Addendum)
CPE in 4.5 month  CBC, fasting lipid and cmp, tsh and HBA1C due   You are being treated for acute sinusitis and bronchitis.  Neb treatment, Rocephin 500mg  and depo medrol in the office Medication is also sent to the office CXR today.  You need to quit smoking.  Work excuse from 4/09 to retrurn 07/26/2011

## 2011-07-21 LAB — LIPID PANEL
Cholesterol: 199 mg/dL (ref 0–200)
Total CHOL/HDL Ratio: 2.8 Ratio

## 2011-08-01 ENCOUNTER — Encounter: Payer: Self-pay | Admitting: Family Medicine

## 2011-08-01 DIAGNOSIS — J019 Acute sinusitis, unspecified: Secondary | ICD-10-CM | POA: Insufficient documentation

## 2011-08-01 NOTE — Assessment & Plan Note (Signed)
Controlled, no change in medication Updated labs needed 

## 2011-08-01 NOTE — Assessment & Plan Note (Signed)
Antibiotics prescribed and work excuse written

## 2011-08-01 NOTE — Assessment & Plan Note (Signed)
Controlled, no change in medication  

## 2011-08-01 NOTE — Assessment & Plan Note (Signed)
Acute symptom onset, neb treatment with rocephin and depo medrol in house and medication sent to pharmacy, also work excuse

## 2011-08-03 ENCOUNTER — Ambulatory Visit (INDEPENDENT_AMBULATORY_CARE_PROVIDER_SITE_OTHER): Payer: BC Managed Care – PPO | Admitting: Family Medicine

## 2011-08-03 ENCOUNTER — Encounter: Payer: Self-pay | Admitting: Family Medicine

## 2011-08-03 ENCOUNTER — Other Ambulatory Visit (HOSPITAL_COMMUNITY)
Admission: RE | Admit: 2011-08-03 | Discharge: 2011-08-03 | Disposition: A | Discharge status: Home / Self Care | Payer: BC Managed Care – PPO | Source: Ambulatory Visit | Attending: Family Medicine | Admitting: Family Medicine

## 2011-08-03 VITALS — BP 140/70 | HR 68 | Resp 16 | Ht 62.25 in | Wt 179.0 lb

## 2011-08-03 DIAGNOSIS — Z01419 Encounter for gynecological examination (general) (routine) without abnormal findings: Secondary | ICD-10-CM | POA: Insufficient documentation

## 2011-08-03 DIAGNOSIS — F17200 Nicotine dependence, unspecified, uncomplicated: Secondary | ICD-10-CM

## 2011-08-03 DIAGNOSIS — E782 Mixed hyperlipidemia: Secondary | ICD-10-CM

## 2011-08-03 DIAGNOSIS — R7309 Other abnormal glucose: Secondary | ICD-10-CM

## 2011-08-03 DIAGNOSIS — M545 Low back pain, unspecified: Secondary | ICD-10-CM

## 2011-08-03 DIAGNOSIS — E039 Hypothyroidism, unspecified: Secondary | ICD-10-CM

## 2011-08-03 DIAGNOSIS — Z0000 Encounter for general adult medical examination without abnormal findings: Secondary | ICD-10-CM

## 2011-08-03 DIAGNOSIS — R5381 Other malaise: Secondary | ICD-10-CM

## 2011-08-03 DIAGNOSIS — E119 Type 2 diabetes mellitus without complications: Secondary | ICD-10-CM

## 2011-08-03 LAB — POC HEMOCCULT BLD/STL (OFFICE/1-CARD/DIAGNOSTIC): Fecal Occult Blood, POC: NEGATIVE

## 2011-08-03 NOTE — Progress Notes (Signed)
  Subjective:    Patient ID: Victoria Reyes, female    DOB: 01-17-1951, 61 y.o.   MRN: [id number]  HPI The PT is here for annual exam and re-evaluation of chronic medical conditions, medication management and review of any available recent lab and radiology data.  Preventive health is updated, specifically  Cancer screening and Immunization.   Questions or concerns regarding consultations or procedures which the PT has had in the interim are  addressed. The PT denies any adverse reactions to current medications since the last visit.  There are no new concerns.       Review of Systems See HPI Denies recent fever or chills. Denies sinus pressure, nasal congestion, ear pain or sore throat. Denies chest congestion, productive cough or wheezing. Denies chest pains, palpitations and leg swelling Denies abdominal pain, nausea, vomiting,diarrhea or constipation.   Denies dysuria, frequency, hesitancy or incontinence.  Denies headaches, seizures, numbness, or tingling. Denies depression, anxiety or insomnia. Denies skin break down or rash.        Objective:   Physical Exam  Pleasant well nourished female, alert and oriented x 3, in no cardio-pulmonary distress. Afebrile. HEENT No facial trauma or asymetry. Sinuses non tender.  EOMI, PERTL, fundoscopic exam  no hemorhage or exudate.  External ears normal, tympanic membranes clear. Oropharynx moist, no exudate, fair  dentition. Neck: supple, no adenopathy,JVD or thyromegaly.No bruits.  Chest: Clear to ascultation bilaterally.No crackles or wheezes. Non tender to palpation  Breast: No asymetry,no masses. No nipple discharge or inversion. No axillary or supraclavicular adenopathy  Cardiovascular system; Heart sounds normal,  S1 and  S2 ,no S3.  No murmur, or thrill. Apical beat not displaced Peripheral pulses normal.  Abdomen: Soft, non tender, no organomegaly or masses. No bruits. Bowel sounds normal. No guarding,  tenderness or rebound.  Rectal:  No mass. Guaiac negative stool.  GU: External genitalia normal. No lesions. Vaginal canal normal.No discharge. Uterus normal size, no adnexal masses, no cervical motion or adnexal tenderness.  Musculoskeletal exam: Decreased ROM of spine, adequate in , hips , shoulders and knees. No deformity ,swelling or crepitus noted. No muscle wasting or atrophy.   Neurologic: Cranial nerves 2 to 12 intact. Power, tone ,sensation and reflexes normal throughout. No disturbance in gait. No tremor.  Skin: Intact, no ulceration, erythema , scaling or rash noted. Pigmentation normal throughout  Psych; Normal mood and affect. Judgement and concentration normal       Assessment & Plan:

## 2011-08-03 NOTE — Patient Instructions (Signed)
F/u in 4 month  Please bring all medication to visit.  HBa1C and TSH today.   Medication for reflux is refilled

## 2011-08-25 DIAGNOSIS — Z0000 Encounter for general adult medical examination without abnormal findings: Secondary | ICD-10-CM | POA: Insufficient documentation

## 2011-08-25 NOTE — Assessment & Plan Note (Signed)
Healthy lifestyle discussed to involve dietary and exercise advice. Cancer screening updated and also immunization addressed. Safety on the road and at home discussed

## 2011-08-25 NOTE — Assessment & Plan Note (Signed)
Worsening, no change in management at this time, weight loss encouraged

## 2011-08-25 NOTE — Assessment & Plan Note (Signed)
Cessation counseling done for more than 3 minutes

## 2011-08-25 NOTE — Assessment & Plan Note (Signed)
Slightly under corrected, no med change at this time 

## 2011-08-25 NOTE — Assessment & Plan Note (Signed)
Hyperlipidemia:Low fat diet discussed and encouraged.  Significance of high LDL discussed and the need to lower this

## 2011-08-25 NOTE — Assessment & Plan Note (Signed)
Controlled, no change in medication  

## 2011-09-07 ENCOUNTER — Encounter: Payer: Self-pay | Admitting: Family Medicine

## 2011-09-07 ENCOUNTER — Ambulatory Visit (INDEPENDENT_AMBULATORY_CARE_PROVIDER_SITE_OTHER): Payer: BC Managed Care – PPO | Admitting: Family Medicine

## 2011-09-07 VITALS — BP 136/80 | HR 84 | Resp 18 | Ht 62.5 in | Wt 180.1 lb

## 2011-09-07 DIAGNOSIS — M25521 Pain in right elbow: Secondary | ICD-10-CM | POA: Insufficient documentation

## 2011-09-07 DIAGNOSIS — M5137 Other intervertebral disc degeneration, lumbosacral region: Secondary | ICD-10-CM

## 2011-09-07 DIAGNOSIS — G8929 Other chronic pain: Secondary | ICD-10-CM

## 2011-09-07 DIAGNOSIS — M545 Low back pain, unspecified: Secondary | ICD-10-CM

## 2011-09-07 DIAGNOSIS — E782 Mixed hyperlipidemia: Secondary | ICD-10-CM

## 2011-09-07 DIAGNOSIS — E039 Hypothyroidism, unspecified: Secondary | ICD-10-CM

## 2011-09-07 DIAGNOSIS — F17200 Nicotine dependence, unspecified, uncomplicated: Secondary | ICD-10-CM

## 2011-09-07 MED ORDER — PREDNISONE (PAK) 5 MG PO TABS: 5.0000 mg | ORAL_TABLET | ORAL | Status: DC

## 2011-09-07 NOTE — Patient Instructions (Signed)
F/u in 4 month.  HBA1C, TSH and chem 7 today if none since November 2012, if in April, then in 4 month before next visit    You are referred for an mRI of your spine due to worsening pain, also for an x ray of right elbow due to pain.  I am also asking dr Eduard Clos to evaluate your elbow, and a prednisone dose pack is prescribed.  I hope the epidural injection today helps

## 2011-09-07 NOTE — Progress Notes (Signed)
  Subjective:    Patient ID: Victoria Reyes, female    DOB: 1951-01-16, 61 y.o.   MRN: [id number]  HPI Pt in with primary c/o increased and worsening back and generalized pains which are limiting her ability to adequately work , states her job recommended she be re evaluated to see if she is actually able to carry out her work . States pain in back radiates down legs left more so than right , and is intolerable at end of work day. Also c/o right elbow pain which is aggravated by movement of the elbow.She uses this joint a lot o her job and feels this is the main aggravating factor   Review of Systems See HPI Denies recent fever or chills. Denies sinus pressure, nasal congestion, ear pain or sore throat. Denies chest congestion, productive cough or wheezing. Denies chest pains, palpitations and leg swelling Denies abdominal pain, nausea, vomiting,diarrhea or constipation.   Denies dysuria, frequency, hesitancy or incontinence.  Denies headaches, seizures, numbness, or tingling. Denies depression, anxiety or insomnia. Denies skin break down or rash.        Objective:   Physical Exam  Patient alert and oriented and in no cardiopulmonary distress.  HEENT: No facial asymmetry, EOMI, no sinus tenderness,  oropharynx pink and moist.  Neck supple no adenopathy.  Chest: Clear to auscultation bilaterally.  CVS: S1, S2 no murmurs, no S3.  ABD: Soft non tender. Bowel sounds normal.  Ext: No edema  [id number]  ROM spine, also tender right elbow  Skin: Intact, no ulcerations or rash noted.  Psych: Good eye contact, normal affect. Memory intact not anxious or depressed appearing.  CNS: CN 2-12 intact, power, tone and sensation normal throughout.       Assessment & Plan:

## 2011-09-09 ENCOUNTER — Ambulatory Visit: Payer: BC Managed Care – PPO | Admitting: Family Medicine

## 2011-09-10 ENCOUNTER — Ambulatory Visit (HOSPITAL_COMMUNITY)
Admission: RE | Admit: 2011-09-10 | Discharge: 2011-09-10 | Disposition: A | Discharge status: Home / Self Care | Payer: BC Managed Care – PPO | Source: Ambulatory Visit | Attending: Family Medicine | Admitting: Family Medicine

## 2011-09-10 DIAGNOSIS — IMO0002 Reserved for concepts with insufficient information to code with codable children: Secondary | ICD-10-CM | POA: Insufficient documentation

## 2011-09-10 DIAGNOSIS — M545 Low back pain, unspecified: Secondary | ICD-10-CM | POA: Insufficient documentation

## 2011-09-10 DIAGNOSIS — M5137 Other intervertebral disc degeneration, lumbosacral region: Secondary | ICD-10-CM

## 2011-09-10 DIAGNOSIS — G8929 Other chronic pain: Secondary | ICD-10-CM

## 2011-09-10 DIAGNOSIS — M51379 Other intervertebral disc degeneration, lumbosacral region without mention of lumbar back pain or lower extremity pain: Secondary | ICD-10-CM | POA: Insufficient documentation

## 2011-09-10 LAB — POCT I-STAT, CHEM 8
Glucose, Bld: 89 mg/dL (ref 70–99)
HCT: 42 % (ref 36.0–46.0)
Hemoglobin: 14.3 g/dL (ref 12.0–15.0)
Potassium: 3.6 mEq/L (ref 3.5–5.1)

## 2011-09-10 MED ORDER — GADOBENATE DIMEGLUMINE 529 MG/ML IV SOLN
17.0000 mL | Freq: Once | INTRAVENOUS | Status: AC | PRN
  Administered 2011-09-10: 17 mL via INTRAVENOUS

## 2011-09-13 NOTE — Assessment & Plan Note (Signed)
Inadequately replaced, needs updated labs

## 2011-09-13 NOTE — Assessment & Plan Note (Signed)
Worsened , with limitation in movement due to pain, aggravated also by activities on the job, obtain x ray of elbow

## 2011-09-13 NOTE — Assessment & Plan Note (Signed)
Cessation counseling done 

## 2011-09-13 NOTE — Assessment & Plan Note (Signed)
Deteriorated, pain uncontrolled and potentially disabling, rept MRI and for epidural today

## 2011-09-13 NOTE — Assessment & Plan Note (Signed)
Importance of low carb diet and weight loss stressed, need updated HBa1C

## 2011-09-14 ENCOUNTER — Telehealth: Payer: Self-pay | Admitting: Family Medicine

## 2011-09-14 DIAGNOSIS — E782 Mixed hyperlipidemia: Secondary | ICD-10-CM

## 2011-09-14 DIAGNOSIS — R5381 Other malaise: Secondary | ICD-10-CM

## 2011-09-14 DIAGNOSIS — R7302 Impaired glucose tolerance (oral): Secondary | ICD-10-CM

## 2011-09-14 DIAGNOSIS — E039 Hypothyroidism, unspecified: Secondary | ICD-10-CM

## 2011-09-14 NOTE — Telephone Encounter (Signed)
Since those labs were done 07/17/11, does she need anything else and when?

## 2011-09-14 NOTE — Telephone Encounter (Signed)
Please let pt know that I have reviewed labs from 02/2011 as most recent , needs updated labs pls HBA1C , TSH , if none since Nov 2012, will need HBA1C, TSH fasting lipid , cmp and EGFR in 4 months from most recent

## 2011-09-17 NOTE — Telephone Encounter (Signed)
She will need fasting lipid, cmp and HBa1C around Sept 18 for sept 25 visit, pls let her know and order, thanks

## 2011-09-17 NOTE — Telephone Encounter (Signed)
Sent to patient.

## 2011-09-17 NOTE — Telephone Encounter (Signed)
I reviewed April labs , she does not need any labs before next visit

## 2011-09-17 NOTE — Telephone Encounter (Signed)
Addended by: Abner Greenspan on: 09/17/2011 04:25 PM   Modules accepted: Orders

## 2011-10-02 ENCOUNTER — Other Ambulatory Visit: Payer: Self-pay | Admitting: Family Medicine

## 2012-01-05 ENCOUNTER — Ambulatory Visit: Payer: BC Managed Care – PPO | Admitting: Family Medicine

## 2012-03-13 ENCOUNTER — Telehealth: Payer: Self-pay | Admitting: Family Medicine

## 2012-03-13 NOTE — Telephone Encounter (Signed)
Faxed in as requested 

## 2012-04-03 ENCOUNTER — Other Ambulatory Visit: Payer: Self-pay | Admitting: Family Medicine

## 2012-04-06 ENCOUNTER — Other Ambulatory Visit: Payer: Self-pay | Admitting: Family Medicine

## 2012-04-06 ENCOUNTER — Encounter: Payer: Self-pay | Admitting: Family Medicine

## 2012-04-06 ENCOUNTER — Ambulatory Visit (INDEPENDENT_AMBULATORY_CARE_PROVIDER_SITE_OTHER): Payer: BC Managed Care – PPO | Admitting: Family Medicine

## 2012-04-06 VITALS — BP 148/82 | HR 82 | Resp 16 | Ht 62.5 in | Wt 188.0 lb

## 2012-04-06 DIAGNOSIS — M545 Low back pain, unspecified: Secondary | ICD-10-CM

## 2012-04-06 DIAGNOSIS — E782 Mixed hyperlipidemia: Secondary | ICD-10-CM

## 2012-04-06 DIAGNOSIS — R7309 Other abnormal glucose: Secondary | ICD-10-CM

## 2012-04-06 DIAGNOSIS — E039 Hypothyroidism, unspecified: Secondary | ICD-10-CM

## 2012-04-06 DIAGNOSIS — M549 Dorsalgia, unspecified: Secondary | ICD-10-CM

## 2012-04-06 DIAGNOSIS — M5137 Other intervertebral disc degeneration, lumbosacral region: Secondary | ICD-10-CM

## 2012-04-06 DIAGNOSIS — M51379 Other intervertebral disc degeneration, lumbosacral region without mention of lumbar back pain or lower extremity pain: Secondary | ICD-10-CM

## 2012-04-06 DIAGNOSIS — R7302 Impaired glucose tolerance (oral): Secondary | ICD-10-CM

## 2012-04-06 LAB — TSH: TSH: 5.984 u[IU]/mL — ABNORMAL HIGH (ref 0.350–4.500)

## 2012-04-06 LAB — HEMOGLOBIN A1C
Hgb A1c MFr Bld: 5.9 % — ABNORMAL HIGH (ref <5.7)
Mean Plasma Glucose: 123 mg/dL — ABNORMAL HIGH (ref <117)

## 2012-04-06 LAB — CBC
Hemoglobin: 12.6 g/dL (ref 12.0–15.0)
MCHC: 33.7 g/dL (ref 30.0–36.0)
RDW: 14.6 % (ref 11.5–15.5)

## 2012-04-06 LAB — COMPREHENSIVE METABOLIC PANEL
ALT: 8 U/L (ref 0–35)
AST: 16 U/L (ref 0–37)
Albumin: 3.9 g/dL (ref 3.5–5.2)
BUN: 13 mg/dL (ref 6–23)
CO2: 24 mEq/L (ref 19–32)
Calcium: 8.9 mg/dL (ref 8.4–10.5)
Chloride: 108 mEq/L (ref 96–112)
Potassium: 4.1 mEq/L (ref 3.5–5.3)

## 2012-04-06 LAB — LIPID PANEL
Cholesterol: 220 mg/dL — ABNORMAL HIGH (ref 0–200)
VLDL: 25 mg/dL (ref 0–40)

## 2012-04-06 MED ORDER — GABAPENTIN 300 MG PO CAPS: 300.0000 mg | ORAL_CAPSULE | Freq: Every evening | ORAL | Status: DC | PRN

## 2012-04-06 MED ORDER — KETOROLAC TROMETHAMINE 60 MG/2ML IM SOLN
60.0000 mg | Freq: Once | INTRAMUSCULAR | Status: AC
  Administered 2012-04-06: 60 mg via INTRAMUSCULAR

## 2012-04-06 MED ORDER — TRAMADOL HCL 50 MG PO TABS: 50.0000 mg | ORAL_TABLET | Freq: Three times a day (TID) | ORAL | Status: DC

## 2012-04-06 MED ORDER — MELOXICAM 7.5 MG PO TABS: 7.5000 mg | ORAL_TABLET | Freq: Every day | ORAL | Status: DC

## 2012-04-06 NOTE — Progress Notes (Signed)
  Subjective:    Patient ID: Victoria Reyes, female    DOB: October 16, 1950, 61 y.o.   MRN: [id number]  HPI  Acute on chronic back pain x 3 days, no longer followed by Dr Eduard Clos has known OA and DJD in lumbar spine, has had back surgery. Has been on many multiple medications, does not tolerate most, also has sciatica down left leg. Has been standing cooking when pain worsened, no change in bowel or bladder Overdue for her labs for chronic medical problems, is not taking most meds prescribed, states she only takes those " she thinks she needs" Review of Systems   GEN- denies fatigue, fever, weight loss,weakness, recent illness HEENT- denies eye drainage, change in vision, nasal discharge, CVS- denies chest pain, palpitations RESP- denies SOB, cough, wheeze ABD- denies N/V, change in stools, abd pain GU- denies dysuria, hematuria, dribbling, incontinence MSK- denies joint pain, muscle aches, injury Neuro- denies headache, dizziness, syncope, seizure activity      Objective:   Physical Exam GEN- NAD, alert and oriented x3 Neck- Supple, normal ROM CVS- RRR, no murmur RESP-CTAB Back- Spine NT, _ SLR left side, decreased ROM Neuro- antalgic stiff gait, normal tone lower ext, sensation intact, decreased strength left compared to right due to pain,  EXT- trace left edema Pulses- Radial, DP- 2+        Assessment & Plan:

## 2012-04-06 NOTE — Assessment & Plan Note (Signed)
Check FLP pt not taking meds on regular basis

## 2012-04-06 NOTE — Assessment & Plan Note (Signed)
Acute on chronic back pain, toradol injection, ultram prn severe pain

## 2012-04-06 NOTE — Patient Instructions (Addendum)
Toradol shot given Gabapentin at bedtime take at 7pm  Ultram for pain  Get the labs done F/U Dr. Lodema Hong 4 weeks

## 2012-04-06 NOTE — Assessment & Plan Note (Signed)
Due for f/u, never had labs done because of "hard stick" she agrees to go to hospital to get labs done

## 2012-04-06 NOTE — Assessment & Plan Note (Signed)
Deteriorated, she declines further intervention, add NSAID, gabapentin at night for neuropathic pain

## 2012-05-04 ENCOUNTER — Ambulatory Visit (INDEPENDENT_AMBULATORY_CARE_PROVIDER_SITE_OTHER): Payer: BC Managed Care – PPO | Admitting: Family Medicine

## 2012-05-04 ENCOUNTER — Encounter: Payer: Self-pay | Admitting: Family Medicine

## 2012-05-04 VITALS — BP 140/82 | HR 78 | Resp 16 | Ht 62.5 in | Wt 185.0 lb

## 2012-05-04 DIAGNOSIS — F17200 Nicotine dependence, unspecified, uncomplicated: Secondary | ICD-10-CM

## 2012-05-04 DIAGNOSIS — M5137 Other intervertebral disc degeneration, lumbosacral region: Secondary | ICD-10-CM

## 2012-05-04 DIAGNOSIS — E039 Hypothyroidism, unspecified: Secondary | ICD-10-CM

## 2012-05-04 DIAGNOSIS — E782 Mixed hyperlipidemia: Secondary | ICD-10-CM

## 2012-05-04 DIAGNOSIS — E785 Hyperlipidemia, unspecified: Secondary | ICD-10-CM

## 2012-05-04 DIAGNOSIS — M545 Low back pain, unspecified: Secondary | ICD-10-CM

## 2012-05-04 DIAGNOSIS — R7309 Other abnormal glucose: Secondary | ICD-10-CM

## 2012-05-04 DIAGNOSIS — M51379 Other intervertebral disc degeneration, lumbosacral region without mention of lumbar back pain or lower extremity pain: Secondary | ICD-10-CM

## 2012-05-04 DIAGNOSIS — R7302 Impaired glucose tolerance (oral): Secondary | ICD-10-CM

## 2012-05-04 DIAGNOSIS — M1990 Unspecified osteoarthritis, unspecified site: Secondary | ICD-10-CM

## 2012-05-04 DIAGNOSIS — M129 Arthropathy, unspecified: Secondary | ICD-10-CM

## 2012-05-04 DIAGNOSIS — E669 Obesity, unspecified: Secondary | ICD-10-CM

## 2012-05-04 MED ORDER — LEVOTHYROXINE SODIUM 100 MCG PO TABS: ORAL_TABLET | ORAL | Status: DC

## 2012-05-04 MED ORDER — PRAVASTATIN SODIUM 20 MG PO TABS: 20.0000 mg | ORAL_TABLET | Freq: Every evening | ORAL | Status: DC

## 2012-05-04 MED ORDER — GABAPENTIN 300 MG PO CAPS: ORAL_CAPSULE | ORAL | Status: DC

## 2012-05-04 MED ORDER — MELOXICAM 15 MG PO TABS: 15.0000 mg | ORAL_TABLET | Freq: Every day | ORAL | Status: AC

## 2012-05-04 NOTE — Patient Instructions (Addendum)
F/u in 4.5 month, call if you need me before  Dose increase in thyroid medicine one and a half tablets, Tuesday, Thursday and Saturday, one tablet all the other days Start pravastatin for high cholesterol and reduce fried and fatty foods.  Please work on weight loss and reduced carb intake and sweets , you are prediabetic  Increase in mobic to 15mg  once daily, twice the dose you have been on  Increase gabapentin to 300mg  TWO at bedtime  Fasting lipid, cmp, HBA1C and TSH in 4.5 month before visit  You really do need to stop smoking as this increases your risk of heart disease, stroke and cancer , as well as lung disease

## 2012-05-11 NOTE — Assessment & Plan Note (Signed)
hBA1C 5.9, which is unchnaged. Please advise the patient that their blood sugars are higher than normal.   A normal HbA1C is less than 5.7, please give them their value.  They need to cut back on carb's and sweets, start regular exercise, target at least 30 minutes 5 days a weekly and aim for weight loss.  This will reduce the risk of becoming diabetic or delayed development.

## 2012-05-11 NOTE — Assessment & Plan Note (Signed)
Deteriorated. Hyperlipidemia:Low fat diet discussed and encouraged.  Start medication also

## 2012-05-11 NOTE — Progress Notes (Signed)
  Subjective:    Patient ID: Victoria Reyes, female    DOB: 06-Jan-1951, 62 y.o.   MRN: [id number]  HPI The PT is here for follow up and re-evaluation of chronic medical conditions, medication management and review of any available recent lab and radiology data.  Preventive health is updated, specifically  Cancer screening and Immunization.   Questions or concerns regarding consultations or procedures which the PT has had in the interim are  addressed. The PT denies any adverse reactions to current medications since the last visit.  There are no new concerns.  C/o uncontrolled and worsening arthritic pain affecting the back and lowe extremities    Review of Systems See HPI Denies recent fever or chills. Denies sinus pressure, nasal congestion, ear pain or sore throat. Denies chest congestion, productive cough or wheezing. Denies chest pains, palpitations and leg swelling Denies abdominal pain, nausea, vomiting,diarrhea or constipation.   Denies dysuria, frequency, hesitancy or incontinence. . Denies headaches, seizures, numbness, or tingling. Denies depression, anxiety or insomnia. Denies skin break down or rash.        Objective:   Physical Exam  Patient alert and oriented and in no cardiopulmonary distress.  HEENT: No facial asymmetry, EOMI, no sinus tenderness,  oropharynx pink and moist.  Neck supple no adenopathy.  Chest: Clear to auscultation bilaterally.  CVS: S1, S2 no murmurs, no S3.  ABD: Soft non tender. Bowel sounds normal.  Ext: No edema  MS: decreased  ROM spine, adequate in shoulders, hips and knees.  Skin: Intact, no ulcerations or rash noted.  Psych: Good eye contact, normal affect. Memory intact not anxious or depressed appearing.  CNS: CN 2-12 intact, power, tone and sensation normal throughout.       Assessment & Plan:

## 2012-05-11 NOTE — Assessment & Plan Note (Signed)
unchanged Patient counseled for approximately 5 minutes regarding the health risks of ongoing nicotine use, specifically all types of cancer, heart disease, stroke and respiratory failure. The options available for help with cessation ,the behavioral changes to assist the process, and the option to either gradully reduce usage  Or abruptly stop.is also discussed. Pt is also encouraged to set specific goals in number of cigarettes used daily, as well as to set a quit date.  

## 2012-05-11 NOTE — Assessment & Plan Note (Signed)
Deteriorated. Patient re-educated about  the importance of commitment to a  minimum of 150 minutes of exercise per week. The importance of healthy food choices with portion control discussed. Encouraged to start a food diary, count calories and to consider  joining a support group. Sample diet sheets offered. Goals set by the patient for the next several months.    

## 2012-05-11 NOTE — Assessment & Plan Note (Addendum)
UnControlled,  Increase dose of synthroid

## 2012-05-11 NOTE — Assessment & Plan Note (Signed)
Increased pain and numbness, increase gabapentin and also dose of mobic

## 2012-07-02 ENCOUNTER — Other Ambulatory Visit: Payer: Self-pay | Admitting: Family Medicine

## 2012-09-16 LAB — LIPID PANEL
Cholesterol: 176 mg/dL (ref 0–200)
Total CHOL/HDL Ratio: 3.5 Ratio
Triglycerides: 76 mg/dL (ref <150)
VLDL: 15 mg/dL (ref 0–40)

## 2012-09-16 LAB — COMPREHENSIVE METABOLIC PANEL
AST: 12 U/L (ref 0–37)
BUN: 16 mg/dL (ref 6–23)
Calcium: 9.1 mg/dL (ref 8.4–10.5)
Chloride: 110 mEq/L (ref 96–112)
Creat: 0.69 mg/dL (ref 0.50–1.10)

## 2012-09-16 LAB — TSH: TSH: 0.407 u[IU]/mL (ref 0.350–4.500)

## 2012-09-19 ENCOUNTER — Ambulatory Visit (INDEPENDENT_AMBULATORY_CARE_PROVIDER_SITE_OTHER): Payer: BC Managed Care – PPO | Admitting: Family Medicine

## 2012-09-19 ENCOUNTER — Encounter: Payer: Self-pay | Admitting: Family Medicine

## 2012-09-19 VITALS — BP 160/84 | HR 85 | Resp 16 | Ht 62.5 in | Wt 189.1 lb

## 2012-09-19 DIAGNOSIS — F17200 Nicotine dependence, unspecified, uncomplicated: Secondary | ICD-10-CM

## 2012-09-19 DIAGNOSIS — M5137 Other intervertebral disc degeneration, lumbosacral region: Secondary | ICD-10-CM

## 2012-09-19 DIAGNOSIS — R7302 Impaired glucose tolerance (oral): Secondary | ICD-10-CM

## 2012-09-19 DIAGNOSIS — R7309 Other abnormal glucose: Secondary | ICD-10-CM

## 2012-09-19 DIAGNOSIS — E039 Hypothyroidism, unspecified: Secondary | ICD-10-CM

## 2012-09-19 DIAGNOSIS — E669 Obesity, unspecified: Secondary | ICD-10-CM

## 2012-09-19 DIAGNOSIS — E782 Mixed hyperlipidemia: Secondary | ICD-10-CM

## 2012-09-19 NOTE — Progress Notes (Signed)
  Subjective:    Patient ID: Victoria Reyes, female    DOB: 1950/07/01, 62 y.o.   MRN: [id number]  HPI The PT is here for follow up and re-evaluation of chronic medical conditions, medication management and review of any available recent lab and radiology data.  Preventive health is updated, specifically  Cancer screening and Immunization.  Mammogram is past due  The PT denies any adverse reactions to current medications since the last visit.  C/o ongoing and uncontrolled low back and right lower extremity pain  Also mild leg swelling which is not new, denies calf tenderness , redness or warmth    Review of Systems See HPI Denies recent fever or chills. Denies sinus pressure, nasal congestion, ear pain or sore throat. Denies chest congestion, productive cough or wheezing. Denies chest pains, palpitations and leg swelling Denies abdominal pain, nausea, vomiting,diarrhea or constipation.   Denies dysuria, frequency, hesitancy or incontinence.  Denies headaches, seizures, numbness, or tingling. Denies depression, anxiety or insomnia. Denies skin break down or rash.        Objective:   Physical Exam  Patient alert and oriented and in no cardiopulmonary distress.  HEENT: No facial asymmetry, EOMI, no sinus tenderness,  oropharynx pink and moist.  Neck supple no adenopathy.  Chest: Clear to auscultation bilaterally.  CVS: S1, S2 no murmurs, no S3.  ABD: Soft non tender. Bowel sounds normal.  Ext: trace left lower ext edema  [id number]  ROM lumbar  Spine,adeqaute in  shoulders, hips and knees.  Skin: Intact, no ulcerations or rash noted.  Psych: Good eye contact, normal affect. Memory intact not anxious or depressed appearing.  CNS: CN 2-12 intact, power, tone and sensation normal throughout.       Assessment & Plan:

## 2012-09-19 NOTE — Patient Instructions (Addendum)
F/u in 5 month, call if you need me before,  Blood pressure high today, please reduce salt, canned foods and lose weight  Cholesterol also slightly high, but better, cut back on red meat and fried foods  Thyroid dose stays the same  Please cut back on tea so you lose 5 to 8 pounds in the next 5 month, this will help the blood pressure  Start calcium with D 1200mg /1000IU one daily for your bones  Fasting lipid, TSh, Cmp and HBa1c in 5 month  Pls schedule your mammogram this is past due

## 2012-10-01 NOTE — Assessment & Plan Note (Signed)
Improved though LDL still slightly elevated. Hyperlipidemia:Low fat diet discussed and encouraged.

## 2012-10-01 NOTE — Assessment & Plan Note (Signed)
Unchanged, Patient counseled for approximately 5 minutes regarding the health risks of ongoing nicotine use, specifically all types of cancer, heart disease, stroke and respiratory failure. The options available for help with cessation ,the behavioral changes to assist the process, and the option to either gradully reduce usage  Or abruptly stop.is also discussed. Pt is also encouraged to set specific goals in number of cigarettes used daily, as well as to set a quit date.  

## 2012-10-01 NOTE — Assessment & Plan Note (Signed)
Controlled, no change in medication  

## 2012-10-01 NOTE — Assessment & Plan Note (Signed)
Deteriorated. Patient re-educated about  the importance of commitment to a  minimum of 150 minutes of exercise per week. The importance of healthy food choices with portion control discussed. Encouraged to start a food diary, count calories and to consider  joining a support group. Sample diet sheets offered. Goals set by the patient for the next several months.    

## 2012-10-01 NOTE — Assessment & Plan Note (Signed)
ongoing left lower extremity pain and weakness, repeatedly states she wants to stop working due to this problem

## 2012-10-16 ENCOUNTER — Other Ambulatory Visit: Payer: Self-pay | Admitting: Family Medicine

## 2012-10-16 DIAGNOSIS — Z139 Encounter for screening, unspecified: Secondary | ICD-10-CM

## 2012-10-23 ENCOUNTER — Ambulatory Visit (HOSPITAL_COMMUNITY)
Admission: RE | Admit: 2012-10-23 | Discharge: 2012-10-23 | Disposition: A | Discharge status: Home / Self Care | Payer: BC Managed Care – PPO | Source: Ambulatory Visit | Attending: Family Medicine | Admitting: Family Medicine

## 2012-10-23 DIAGNOSIS — Z1231 Encounter for screening mammogram for malignant neoplasm of breast: Secondary | ICD-10-CM | POA: Insufficient documentation

## 2012-10-23 DIAGNOSIS — Z139 Encounter for screening, unspecified: Secondary | ICD-10-CM

## 2012-12-02 ENCOUNTER — Other Ambulatory Visit: Payer: Self-pay | Admitting: Family Medicine

## 2012-12-09 ENCOUNTER — Other Ambulatory Visit: Payer: Self-pay | Admitting: Family Medicine

## 2013-01-01 ENCOUNTER — Encounter: Payer: Self-pay | Admitting: Family Medicine

## 2013-01-01 ENCOUNTER — Ambulatory Visit (INDEPENDENT_AMBULATORY_CARE_PROVIDER_SITE_OTHER): Payer: BC Managed Care – PPO | Admitting: Family Medicine

## 2013-01-01 VITALS — BP 160/90 | HR 73 | Resp 18 | Ht 62.5 in | Wt 190.1 lb

## 2013-01-01 DIAGNOSIS — E782 Mixed hyperlipidemia: Secondary | ICD-10-CM

## 2013-01-01 DIAGNOSIS — H5789 Other specified disorders of eye and adnexa: Secondary | ICD-10-CM

## 2013-01-01 DIAGNOSIS — E785 Hyperlipidemia, unspecified: Secondary | ICD-10-CM

## 2013-01-01 DIAGNOSIS — E039 Hypothyroidism, unspecified: Secondary | ICD-10-CM

## 2013-01-01 DIAGNOSIS — I10 Essential (primary) hypertension: Secondary | ICD-10-CM

## 2013-01-01 DIAGNOSIS — R7309 Other abnormal glucose: Secondary | ICD-10-CM

## 2013-01-01 DIAGNOSIS — E669 Obesity, unspecified: Secondary | ICD-10-CM

## 2013-01-01 DIAGNOSIS — F17200 Nicotine dependence, unspecified, uncomplicated: Secondary | ICD-10-CM

## 2013-01-01 DIAGNOSIS — R7302 Impaired glucose tolerance (oral): Secondary | ICD-10-CM

## 2013-01-01 HISTORY — DX: Essential (primary) hypertension: I10

## 2013-01-01 MED ORDER — AMLODIPINE BESYLATE 5 MG PO TABS: 5.0000 mg | ORAL_TABLET | Freq: Every day | ORAL | Status: DC

## 2013-01-01 NOTE — Progress Notes (Signed)
  Subjective:    Patient ID: Victoria Reyes, female    DOB: [birth year], 62 y.o.   MRN: [id number]  HPI 1 month ago pt went to her eye specialist with fiery red eyes, no pain, some drianage , treated for 7 days, resolved, but this past weekend started red eyes, no pain or discomfort, but  For the first  Time she is having blurred vision with this , came in for evaluation and now found to have elevated blood pressure, which is new. Still smokes and unwilling to commit to quitting though reducing use   Review of Systems See HPI Denies recent fever or chills. Denies sinus pressure, nasal congestion, ear pain or sore throat. Denies chest congestion, productive cough or wheezing. Denies chest pains, palpitations and leg swelling Denies abdominal pain, nausea, vomiting,diarrhea or constipation.   Denies dysuria, frequency, hesitancy or incontinence. Denies joint pain, swelling and limitation in mobility. Denies headaches, seizures, numbness, or tingling. Denies depression, anxiety or insomnia. Denies skin break down or rash.        Objective:   Physical Exam  Patient alert and oriented and in no cardiopulmonary distress.  HEENT: No facial asymmetry, EOMI, no sinus tenderness,  oropharynx pink and moist.  Neck supple no adenopathy.Bilateral erythema of both eyes, no visible drainage  Chest: Clear to auscultation bilaterally.  CVS: S1, S2 no murmurs, no S3.  ABD: Soft non tender. Bowel sounds normal.  Ext: No edema  MS: Adequate ROM spine, shoulders, hips and knees.  Skin: Intact, no ulcerations or rash noted.  Psych: Good eye contact, normal affect. Memory intact not anxious or depressed appearing.  CNS: CN 2-12 intact, power, tone and sensation normal throughout.       Assessment & Plan:

## 2013-01-01 NOTE — Patient Instructions (Addendum)
F/u in Novemebr as before  Lipid, cmp, TSH today  No labs for  November   New for blood pressure is amlodipine 5mg  one daily , take at the same time every day   You are referred for re evaluation of your eyes, appt tomorrow as discussed , you will get info on time, due to recurrent redness now with blurry vision

## 2013-01-02 LAB — LIPID PANEL
Cholesterol: 215 mg/dL — ABNORMAL HIGH (ref 0–200)
Triglycerides: 143 mg/dL (ref <150)
VLDL: 29 mg/dL (ref 0–40)

## 2013-01-02 LAB — COMPREHENSIVE METABOLIC PANEL
AST: 15 U/L (ref 0–37)
Albumin: 4.2 g/dL (ref 3.5–5.2)
Alkaline Phosphatase: 120 U/L — ABNORMAL HIGH (ref 39–117)
BUN: 10 mg/dL (ref 6–23)
CO2: 26 mEq/L (ref 19–32)
Calcium: 9.2 mg/dL (ref 8.4–10.5)
Chloride: 105 mEq/L (ref 96–112)
Creat: 0.61 mg/dL (ref 0.50–1.10)
Glucose, Bld: 74 mg/dL (ref 70–99)
Potassium: 3.8 mEq/L (ref 3.5–5.3)

## 2013-01-02 LAB — TSH: TSH: 1.172 u[IU]/mL (ref 0.350–4.500)

## 2013-01-03 ENCOUNTER — Other Ambulatory Visit: Payer: Self-pay | Admitting: Family Medicine

## 2013-01-03 ENCOUNTER — Ambulatory Visit (INDEPENDENT_AMBULATORY_CARE_PROVIDER_SITE_OTHER): Payer: BC Managed Care – PPO

## 2013-01-03 ENCOUNTER — Telehealth: Payer: Self-pay

## 2013-01-03 VITALS — BP 160/90 | Wt 190.0 lb

## 2013-01-03 DIAGNOSIS — M5432 Sciatica, left side: Secondary | ICD-10-CM

## 2013-01-03 DIAGNOSIS — M5430 Sciatica, unspecified side: Secondary | ICD-10-CM

## 2013-01-03 MED ORDER — PREDNISONE 5 MG PO TABS: 5.0000 mg | ORAL_TABLET | Freq: Two times a day (BID) | ORAL | Status: AC

## 2013-01-03 MED ORDER — PRAVASTATIN SODIUM 40 MG PO TABS: 40.0000 mg | ORAL_TABLET | Freq: Every day | ORAL | Status: DC

## 2013-01-03 MED ORDER — METHYLPREDNISOLONE ACETATE 80 MG/ML IJ SUSP
80.0000 mg | Freq: Once | INTRAMUSCULAR | Status: AC
  Administered 2013-01-03: 80 mg via INTRAMUSCULAR

## 2013-01-03 MED ORDER — KETOROLAC TROMETHAMINE 60 MG/2ML IM SOLN
60.0000 mg | Freq: Once | INTRAMUSCULAR | Status: AC
  Administered 2013-01-03: 60 mg via INTRAMUSCULAR

## 2013-01-03 NOTE — Progress Notes (Signed)
Patient aware Med increased and lab order mailed to be done in 4.5 months

## 2013-01-03 NOTE — Progress Notes (Signed)
Patient in to get pain shots per Dr (toradol 60 and depo 80mg  ) for her left leg sciatica pain that has gotten worse

## 2013-01-03 NOTE — Telephone Encounter (Signed)
Is having sciatica pain, worse the past few days in her left leg and states that you give her shots for this from time to time. Wants to know if she can come in to have an injection today?

## 2013-01-03 NOTE — Telephone Encounter (Signed)
Ok to give toradol 60mg  and depo medrol 80 mg Im in office, I offered 2 days ago, but she "couldn't wait" I will also send in pred x 5 days. You will need to generate a nurse visit

## 2013-01-03 NOTE — Telephone Encounter (Signed)
Patient aware.

## 2013-01-03 NOTE — Telephone Encounter (Signed)
noted 

## 2013-02-20 ENCOUNTER — Ambulatory Visit: Payer: BC Managed Care – PPO | Admitting: Family Medicine

## 2013-02-20 ENCOUNTER — Telehealth: Payer: Self-pay | Admitting: Family Medicine

## 2013-02-20 MED ORDER — LEVOTHYROXINE SODIUM 100 MCG PO TABS: ORAL_TABLET | ORAL | Status: DC

## 2013-02-20 MED ORDER — OMEPRAZOLE 40 MG PO CPDR: DELAYED_RELEASE_CAPSULE | ORAL | Status: DC

## 2013-02-20 NOTE — Telephone Encounter (Signed)
meds refilled. Pravastatin already has been filled with 5 refills

## 2013-03-10 ENCOUNTER — Telehealth: Payer: Self-pay | Admitting: Family Medicine

## 2013-03-10 NOTE — Telephone Encounter (Signed)
I called pt, needs to be added on 12/02 at 4pm, she is aware,  Reason for visit is high blood pressure, thankl you

## 2013-03-10 NOTE — Assessment & Plan Note (Signed)
Unchanged and unwilling to set a quit date. Cessation counseling done

## 2013-03-10 NOTE — Assessment & Plan Note (Signed)
Controlled, no change in medication  

## 2013-03-10 NOTE — Assessment & Plan Note (Signed)
Uncontrolled Hyperlipidemia:Low fat diet discussed and encouraged.   

## 2013-03-10 NOTE — Assessment & Plan Note (Signed)
Deteriorated. Patient re-educated about  the importance of commitment to a  minimum of 150 minutes of exercise per week. The importance of healthy food choices with portion control discussed. Encouraged to start a food diary, count calories and to consider  joining a support group. Sample diet sheets offered. Goals set by the patient for the next several months.    

## 2013-03-10 NOTE — Assessment & Plan Note (Signed)
Updated lab needed Patient educated about the importance of limiting  Carbohydrate intake , the need to commit to daily physical activity for a minimum of 30 minutes , and to commit weight loss. The fact that changes in all these areas will reduce or eliminate all together the development of diabetes is stressed.    

## 2013-03-10 NOTE — Assessment & Plan Note (Signed)
Prolonged history , with blurry vision which is new and pain,  Was recently seen by eye specialist , but needs urgent re evaluation

## 2013-03-10 NOTE — Assessment & Plan Note (Signed)
New , uncontrolled start amlodipine  DASH diet and commitment to daily physical activity for a minimum of 30 minutes discussed and encouraged, as a part of hypertension management. The importance of attaining a healthy weight is also discussed.

## 2013-03-13 ENCOUNTER — Encounter: Payer: Self-pay | Admitting: Family Medicine

## 2013-03-13 ENCOUNTER — Ambulatory Visit (INDEPENDENT_AMBULATORY_CARE_PROVIDER_SITE_OTHER): Payer: BC Managed Care – PPO | Admitting: Family Medicine

## 2013-03-13 VITALS — BP 130/80 | HR 90 | Resp 18 | Ht 62.5 in | Wt 191.1 lb

## 2013-03-13 DIAGNOSIS — E559 Vitamin D deficiency, unspecified: Secondary | ICD-10-CM

## 2013-03-13 DIAGNOSIS — E785 Hyperlipidemia, unspecified: Secondary | ICD-10-CM

## 2013-03-13 DIAGNOSIS — R7301 Impaired fasting glucose: Secondary | ICD-10-CM

## 2013-03-13 DIAGNOSIS — M545 Low back pain, unspecified: Secondary | ICD-10-CM

## 2013-03-13 DIAGNOSIS — E039 Hypothyroidism, unspecified: Secondary | ICD-10-CM

## 2013-03-13 DIAGNOSIS — E669 Obesity, unspecified: Secondary | ICD-10-CM

## 2013-03-13 DIAGNOSIS — K219 Gastro-esophageal reflux disease without esophagitis: Secondary | ICD-10-CM

## 2013-03-13 DIAGNOSIS — E782 Mixed hyperlipidemia: Secondary | ICD-10-CM

## 2013-03-13 DIAGNOSIS — I10 Essential (primary) hypertension: Secondary | ICD-10-CM

## 2013-03-13 DIAGNOSIS — F17200 Nicotine dependence, unspecified, uncomplicated: Secondary | ICD-10-CM

## 2013-03-13 NOTE — Progress Notes (Signed)
   Subjective:    Patient ID: Selena Lesser, female    DOB: [birth year], 62 y.o.   MRN: [id number]  HPI The PT is here for follow up and re-evaluation of chronic medical conditions, espescially HTN , which was uncontrolled at last visit, medication management and review of any available recent lab and radiology data.  Preventive health is updated, specifically  Cancer screening and Immunization.   Questions or concerns regarding consultations or procedures which the PT has had in the interim are  addressed. The PT denies any adverse reactions to current medications since the last visit.  There are no new concerns.  There are no specific complaints       Review of Systems See HPI Denies recent fever or chills. Denies sinus pressure, nasal congestion, ear pain or sore throat. Denies chest congestion, productive cough or wheezing. Denies chest pains, palpitations and leg swelling Denies abdominal pain, nausea, vomiting,diarrhea or constipation.   Denies dysuria, frequency, hesitancy or incontinence. Chronic back pain is unchanged controlled with NSAID Denies headaches, seizures, numbness, or tingling. Denies depression, anxiety or insomnia. Denies skin break down or rash.        Objective:   Physical Exam Patient alert and oriented and in no cardiopulmonary distress.  HEENT: No facial asymmetry, EOMI, no sinus tenderness,  oropharynx pink and moist.  Neck supple no adenopathy.  Chest: Clear to auscultation bilaterally.Decreased air entry though adequate.  CVS: S1, S2 no murmurs, no S3.  ABD: Soft non tender. Bowel sounds normal.  Ext: No edema  MS: Adequate ROM spine, shoulders, hips and knees.  Skin: Intact, no ulcerations or rash noted.  Psych: Good eye contact, normal affect. Memory intact not anxious or depressed appearing.  CNS: CN 2-12 intact, power, tone and sensation normal throughout.        Assessment & Plan:

## 2013-03-13 NOTE — Patient Instructions (Signed)
F/u in Feb as before,call if you need me before .  Fasting lipid, cmp, HBA1C, TSH, Vit D, CBC in Feb before visit.  Pls cut back on cigarettes you need to quit  Blood pressure is excellent, no med change

## 2013-03-19 ENCOUNTER — Telehealth: Payer: Self-pay | Admitting: Family Medicine

## 2013-03-19 NOTE — Telephone Encounter (Signed)
Noted.   Please advise.  Patient recently in for ov

## 2013-03-22 ENCOUNTER — Ambulatory Visit (INDEPENDENT_AMBULATORY_CARE_PROVIDER_SITE_OTHER): Payer: BC Managed Care – PPO | Admitting: Family Medicine

## 2013-03-22 ENCOUNTER — Other Ambulatory Visit (HOSPITAL_COMMUNITY)
Admission: RE | Admit: 2013-03-22 | Discharge: 2013-03-22 | Disposition: A | Discharge status: Home / Self Care | Payer: BC Managed Care – PPO | Source: Ambulatory Visit | Attending: Family Medicine | Admitting: Family Medicine

## 2013-03-22 ENCOUNTER — Encounter: Payer: Self-pay | Admitting: Family Medicine

## 2013-03-22 VITALS — BP 132/82 | HR 80 | Resp 18 | Ht 62.5 in | Wt 191.0 lb

## 2013-03-22 DIAGNOSIS — N760 Acute vaginitis: Secondary | ICD-10-CM | POA: Insufficient documentation

## 2013-03-22 DIAGNOSIS — I10 Essential (primary) hypertension: Secondary | ICD-10-CM

## 2013-03-22 DIAGNOSIS — Z113 Encounter for screening for infections with a predominantly sexual mode of transmission: Secondary | ICD-10-CM | POA: Insufficient documentation

## 2013-03-22 MED ORDER — METRONIDAZOLE 500 MG PO TABS: 500.0000 mg | ORAL_TABLET | Freq: Two times a day (BID) | ORAL | Status: DC

## 2013-03-22 NOTE — Patient Instructions (Addendum)
F/u as before  Specimens are sent , and you will be contacted as soon as results are available if medication prescribed needs to be changed  Douching causes more problems with vaginal discharge than helps , so DO NOT Douche

## 2013-03-24 NOTE — Assessment & Plan Note (Signed)
Specimens sent for testing, likely Bv so treated presumptively.

## 2013-03-24 NOTE — Assessment & Plan Note (Signed)
Controlled, no change in medication  

## 2013-03-24 NOTE — Progress Notes (Signed)
   Subjective:    Patient ID: Victoria Reyes, female    DOB: [birth year], 62 y.o.   MRN: [id number]  HPI 1 week h/o smelly vaginal d/c after douching. Denies urinary symptoms. No fever or chills   Review of Systems See HPI Denies recent fever or chills. Denies sinus pressure, nasal congestion, ear pain or sore throat. Denies chest congestion, productive cough or wheezing. Denies chest pains, palpitations and leg swelling  Denies depression, anxiety or insomnia. Denies skin break down or rash.        Objective:   Physical Exam  Patient alert and oriented and in no cardiopulmonary distress.  HEENT: No facial asymmetry, EOMI, no sinus tenderness,  oropharynx pink and moist.  Neck supple no adenopathy.  Chest: Clear to auscultation bilaterally.Decfeased air entry  CVS: S1, S2 no murmurs, no S3.  ABD: Soft non tender. Bowel sounds normal. Pelvic: white vaginal d/c, no ulcers, no cevrcal motion or adnexal tenderness Ext: No edema  MS: Adequate ROM spine, shoulders, hips and knees.  Skin: Intact, no ulcerations or rash noted.  Psych: Good eye contact, normal affect. Memory intact not anxious or depressed appearing.  CNS: CN 2-12 intact, power,  normal throughout.       Assessment & Plan:

## 2013-04-08 NOTE — Assessment & Plan Note (Signed)
unchanged Patient counseled for approximately 5 minutes regarding the health risks of ongoing nicotine use, specifically all types of cancer, heart disease, stroke and respiratory failure. The options available for help with cessation ,the behavioral changes to assist the process, and the option to either gradully reduce usage  Or abruptly stop.is also discussed. Pt is also encouraged to set specific goals in number of cigarettes used daily, as well as to set a quit date.  

## 2013-04-08 NOTE — Assessment & Plan Note (Signed)
Unchanged, continue meloxicam as needed

## 2013-04-08 NOTE — Assessment & Plan Note (Signed)
Controlled, no change in medication DASH diet and commitment to daily physical activity for a minimum of 30 minutes discussed and encouraged, as a part of hypertension management. The importance of attaining a healthy weight is also discussed.  

## 2013-04-08 NOTE — Assessment & Plan Note (Signed)
Controlled, no change in medication  

## 2013-04-08 NOTE — Assessment & Plan Note (Signed)
Hyperlipidemia:Low fat diet discussed and encouraged.  Updated lab next visit, uncontrolled due to med ands dietary non compliance when last checked

## 2013-04-08 NOTE — Assessment & Plan Note (Signed)
Controlled, no change in medication Updated lab in 4 month

## 2013-04-08 NOTE — Assessment & Plan Note (Signed)
Unchanged. Patient re-educated about  the importance of commitment to a  minimum of 150 minutes of exercise per week. The importance of healthy food choices with portion control discussed. Encouraged to start a food diary, count calories and to consider  joining a support group. Sample diet sheets offered. Goals set by the patient for the next several months.    

## 2013-05-28 ENCOUNTER — Ambulatory Visit: Payer: BC Managed Care – PPO | Admitting: Family Medicine

## 2013-06-11 ENCOUNTER — Encounter: Payer: Self-pay | Admitting: Family Medicine

## 2013-06-11 ENCOUNTER — Ambulatory Visit (INDEPENDENT_AMBULATORY_CARE_PROVIDER_SITE_OTHER): Payer: BC Managed Care – PPO | Admitting: Family Medicine

## 2013-06-11 VITALS — BP 144/80 | HR 66 | Resp 16 | Wt 191.0 lb

## 2013-06-11 DIAGNOSIS — H109 Unspecified conjunctivitis: Secondary | ICD-10-CM

## 2013-06-11 DIAGNOSIS — R7309 Other abnormal glucose: Secondary | ICD-10-CM

## 2013-06-11 DIAGNOSIS — E039 Hypothyroidism, unspecified: Secondary | ICD-10-CM

## 2013-06-11 DIAGNOSIS — E785 Hyperlipidemia, unspecified: Secondary | ICD-10-CM

## 2013-06-11 DIAGNOSIS — R7302 Impaired glucose tolerance (oral): Secondary | ICD-10-CM

## 2013-06-11 DIAGNOSIS — F17200 Nicotine dependence, unspecified, uncomplicated: Secondary | ICD-10-CM

## 2013-06-11 DIAGNOSIS — E669 Obesity, unspecified: Secondary | ICD-10-CM

## 2013-06-11 DIAGNOSIS — I10 Essential (primary) hypertension: Secondary | ICD-10-CM

## 2013-06-11 LAB — LIPID PANEL
Cholesterol: 188 mg/dL (ref 0–200)
HDL: 61 mg/dL (ref >39)
LDL Cholesterol: 100 mg/dL — ABNORMAL HIGH (ref 0–99)
Total CHOL/HDL Ratio: 3.1 Ratio
Triglycerides: 133 mg/dL (ref <150)
VLDL: 27 mg/dL (ref 0–40)

## 2013-06-11 LAB — TSH: TSH: 7.946 u[IU]/mL — ABNORMAL HIGH (ref 0.350–4.500)

## 2013-06-11 LAB — CBC
HCT: 41.5 % (ref 36.0–46.0)
Hemoglobin: 13.7 g/dL (ref 12.0–15.0)
MCH: 28.7 pg (ref 26.0–34.0)
MCHC: 33 g/dL (ref 30.0–36.0)
MCV: 86.8 fL (ref 78.0–100.0)
Platelets: 274 10*3/uL (ref 150–400)
RBC: 4.78 MIL/uL (ref 3.87–5.11)
RDW: 14.4 % (ref 11.5–15.5)
WBC: 8.5 10*3/uL (ref 4.0–10.5)

## 2013-06-11 LAB — COMPREHENSIVE METABOLIC PANEL
ALT: <8 U/L (ref 0–35)
AST: 13 U/L (ref 0–37)
Albumin: 3.8 g/dL (ref 3.5–5.2)
Alkaline Phosphatase: 108 U/L (ref 39–117)
BUN: 11 mg/dL (ref 6–23)
CO2: 26 mEq/L (ref 19–32)
Calcium: 8.9 mg/dL (ref 8.4–10.5)
Chloride: 108 mEq/L (ref 96–112)
Creat: 0.62 mg/dL (ref 0.50–1.10)
Glucose, Bld: 89 mg/dL (ref 70–99)
Potassium: 3.8 mEq/L (ref 3.5–5.3)
Sodium: 143 mEq/L (ref 135–145)
Total Bilirubin: 0.5 mg/dL (ref 0.2–1.2)
Total Protein: 6.4 g/dL (ref 6.0–8.3)

## 2013-06-11 LAB — HEMOGLOBIN A1C
Hgb A1c MFr Bld: 6 % — ABNORMAL HIGH (ref <5.7)
Mean Plasma Glucose: 126 mg/dL — ABNORMAL HIGH (ref <117)

## 2013-06-11 MED ORDER — CIPROFLOXACIN HCL 500 MG PO TABS: 500.0000 mg | ORAL_TABLET | Freq: Two times a day (BID) | ORAL | Status: DC

## 2013-06-11 MED ORDER — LEVOTHYROXINE SODIUM 100 MCG PO TABS: ORAL_TABLET | ORAL | Status: DC

## 2013-06-11 NOTE — Patient Instructions (Addendum)
CPE in .4.5 month, call if you need me before  Quit date for cigarettes is March 9!  Blood pressure is high you need to take medication EVERY day  Thyroid medication is increased to one and a half tablets Monday through Friday, and one tablet on Saturday and Sunday  Ciprofloxacin tablet is prescribed for 5 days to help with eye drainage, it is important that you take as directed  Blood sugar has increased you need to cut back on sweets and carbs and work on weight loss  Cholesterol is better but still please reduce fried and fatty foods and red meat

## 2013-06-11 NOTE — Assessment & Plan Note (Addendum)
compliant with medication, however SBP elevated, needs to work on lifestyle change , no change in medication, needs to commit to taking med on scheduled basis

## 2013-06-11 NOTE — Progress Notes (Signed)
Subjective:    Patient ID: Victoria Reyes, female    DOB: 10-02-1950, 63 y.o.   MRN: [id number]  HPI  Crusting and puffiness and redness of both eyes for over 4 months, right worse than left no improvement, uses systane. Drainage and closure, no pain no vision loss, hjas been seen by eye specialist about this with noi improvement reportedly. Still smoking but less and wants to quit No regular commitment to exercise outside of waht she does on her job  Review of Systems See HPI Denies recent fever or chills. Denies sinus pressure, nasal congestion, ear pain or sore throat. Denies chest congestion, productive cough or wheezing. Denies chest pains, palpitations and leg swelling Denies abdominal pain, nausea, vomiting,diarrhea or constipation.   Denies dysuria, frequency, hesitancy or incontinence. Chronic back and lower extremity pain with limitation in mobility. Denies headaches, seizures, numbness, or tingling. Denies depression, anxiety or insomnia. Denies skin break down or rash.        Objective:   Physical Exam BP 144/80  Pulse 66  Resp 16  Wt 191 lb (86.637 kg)  SpO2 99% Patient alert and oriented and in no cardiopulmonary distress.  HEENT: No facial asymmetry, EOMI,   oropharynx pink and moist.  Neck supple no JVD, no mass. Bilateral conjunctival injection with periorbital swelling and drainage noted  Chest: Clear to auscultation bilaterally.  CVS: S1, S2 no murmurs, no S3.Regular rate.  ABD: Soft non tender.   Ext: No edema  MS: Adequate though reduced  ROM spine, adeqaute in  shoulders, hips and knees.  Skin: Intact, no ulcerations or rash noted.  Psych: Good eye contact, normal affect. Memory intact not anxious or depressed appearing.  CNS: CN 2-12 intact, power,  normal throughout.no focal deficits noted.        Assessment & Plan:  HTN (hypertension) compliant with medication, however SBP elevated, needs to work on lifestyle change , no change  in medication, needs to commit to taking med on scheduled basis  Hyperlipidemia with target LDL less than 100 Improved, but lDL still elevated Hyperlipidemia:Low fat diet discussed and encouraged.  No med change Updated lab needed at/ before next visit.   Conjunctivitis unspecified antibioitic prescribe dand pt counseled to take as directed for effect, should return to opthalmology if symptoms persist or worsen  IGT (impaired glucose tolerance) deteriorated Patient educated about the importance of limiting  Carbohydrate intake , the need to commit to daily physical activity for a minimum of 30 minutes , and to commit weight loss. The fact that changes in all these areas will reduce or eliminate all together the development of diabetes is stressed.     OBESITY Unchanged Patient re-educated about  the importance of commitment to a  minimum of 150 minutes of exercise per week. The importance of healthy food choices with portion control discussed. Encouraged to start a food diary, count calories and to consider  joining a support group. Sample diet sheets offered. Goals set by the patient for the next several months.     HYPOTHYROIDISM Under corrected dose adjustment made with f/u lab  TOBACCO USER Reduced use but still has not quit, date set  Patient counseled for approximately 5 minutes regarding the health risks of ongoing nicotine use, specifically all types of cancer, heart disease, stroke and respiratory failure. The options available for help with cessation ,the behavioral changes to assist the process, and the option to either gradully reduce usage  Or abruptly stop.is also discussed. Pt is  also encouraged to set specific goals in number of cigarettes used daily, as well as to set a quit date.

## 2013-06-12 LAB — VITAMIN D 25 HYDROXY (VIT D DEFICIENCY, FRACTURES): Vit D, 25-Hydroxy: 19 ng/mL — ABNORMAL LOW (ref 30–89)

## 2013-06-25 ENCOUNTER — Other Ambulatory Visit: Payer: Self-pay

## 2013-06-25 MED ORDER — VITAMIN D (ERGOCALCIFEROL) 1.25 MG (50000 UNIT) PO CAPS: 50000.0000 [IU] | ORAL_CAPSULE | ORAL | Status: DC

## 2013-07-16 ENCOUNTER — Encounter: Payer: Self-pay | Admitting: Gastroenterology

## 2013-08-08 ENCOUNTER — Telehealth: Payer: Self-pay

## 2013-08-08 NOTE — Telephone Encounter (Signed)
Pt called 4 pm and wanted to come in and show you her leg. States left leg has been swollen for a "long time" around her ankle area. Notices it everyday and seems like the warmer the weather, the bigger it swells. No pain. Told her I would send you a message and let you decide the next step. Please advise [phone]

## 2013-08-08 NOTE — Telephone Encounter (Signed)
Patient required late evening appt so earliest available was Wed for OV

## 2013-08-08 NOTE — Telephone Encounter (Signed)
Needs appt when available, tomorrow may be worked in if we have available or early next week

## 2013-08-15 ENCOUNTER — Encounter: Payer: Self-pay | Admitting: Family Medicine

## 2013-08-15 ENCOUNTER — Ambulatory Visit (INDEPENDENT_AMBULATORY_CARE_PROVIDER_SITE_OTHER): Payer: BC Managed Care – PPO | Admitting: Family Medicine

## 2013-08-15 VITALS — BP 130/84 | HR 71 | Resp 16 | Wt 182.4 lb

## 2013-08-15 DIAGNOSIS — M7989 Other specified soft tissue disorders: Secondary | ICD-10-CM

## 2013-08-15 DIAGNOSIS — M25562 Pain in left knee: Secondary | ICD-10-CM | POA: Insufficient documentation

## 2013-08-15 DIAGNOSIS — E039 Hypothyroidism, unspecified: Secondary | ICD-10-CM

## 2013-08-15 DIAGNOSIS — M25579 Pain in unspecified ankle and joints of unspecified foot: Secondary | ICD-10-CM

## 2013-08-15 DIAGNOSIS — F17200 Nicotine dependence, unspecified, uncomplicated: Secondary | ICD-10-CM

## 2013-08-15 DIAGNOSIS — I10 Essential (primary) hypertension: Secondary | ICD-10-CM

## 2013-08-15 DIAGNOSIS — M25572 Pain in left ankle and joints of left foot: Secondary | ICD-10-CM | POA: Insufficient documentation

## 2013-08-15 DIAGNOSIS — M25569 Pain in unspecified knee: Secondary | ICD-10-CM

## 2013-08-15 MED ORDER — PREDNISONE 5 MG PO TABS: 5.0000 mg | ORAL_TABLET | Freq: Two times a day (BID) | ORAL | Status: AC

## 2013-08-15 MED ORDER — INDOMETHACIN 25 MG PO CAPS: ORAL_CAPSULE | ORAL | Status: DC

## 2013-08-15 NOTE — Patient Instructions (Signed)
F/u as before  You are treated for acute left ankle gout, x rays of left knee and ankle are ordered  toradol 60mg  and depomedrol 80 mg iM in the office today and medication, indomethacin and prednisone are prescribed  Work excuse for tomorrow to return the following day  CALL tomorrow  To get the time for [place] of left leg to make sure no clot in the leg  You are referred to Dr Luna Glasgow re left knee  tSH and uric acid level today  You need top take BP and cholesterol med every day

## 2013-08-16 ENCOUNTER — Ambulatory Visit (HOSPITAL_COMMUNITY)
Admission: RE | Admit: 2013-08-16 | Discharge: 2013-08-16 | Disposition: A | Discharge status: Home / Self Care | Payer: BC Managed Care – PPO | Source: Ambulatory Visit | Attending: Family Medicine | Admitting: Family Medicine

## 2013-08-16 ENCOUNTER — Other Ambulatory Visit: Payer: Self-pay

## 2013-08-16 DIAGNOSIS — M25562 Pain in left knee: Secondary | ICD-10-CM

## 2013-08-16 DIAGNOSIS — I10 Essential (primary) hypertension: Secondary | ICD-10-CM

## 2013-08-16 DIAGNOSIS — E785 Hyperlipidemia, unspecified: Secondary | ICD-10-CM

## 2013-08-16 DIAGNOSIS — Z87891 Personal history of nicotine dependence: Secondary | ICD-10-CM | POA: Insufficient documentation

## 2013-08-16 DIAGNOSIS — M25572 Pain in left ankle and joints of left foot: Secondary | ICD-10-CM

## 2013-08-16 DIAGNOSIS — M7989 Other specified soft tissue disorders: Secondary | ICD-10-CM

## 2013-08-16 DIAGNOSIS — E039 Hypothyroidism, unspecified: Secondary | ICD-10-CM

## 2013-08-16 DIAGNOSIS — M79609 Pain in unspecified limb: Secondary | ICD-10-CM | POA: Insufficient documentation

## 2013-08-16 LAB — URIC ACID: Uric Acid, Serum: 4.2 mg/dL (ref 2.4–7.0)

## 2013-08-16 LAB — TSH: TSH: 0.14 u[IU]/mL — AB (ref 0.350–4.500)

## 2013-08-16 MED ORDER — METHYLPREDNISOLONE ACETATE 80 MG/ML IJ SUSP
80.0000 mg | Freq: Once | INTRAMUSCULAR | Status: AC
  Administered 2013-08-16: 80 mg via INTRAMUSCULAR

## 2013-08-16 MED ORDER — AMLODIPINE BESYLATE 5 MG PO TABS: 5.0000 mg | ORAL_TABLET | Freq: Every day | ORAL | Status: DC

## 2013-08-16 MED ORDER — LEVOTHYROXINE SODIUM 100 MCG PO TABS: ORAL_TABLET | ORAL | Status: DC

## 2013-08-16 MED ORDER — PRAVASTATIN SODIUM 40 MG PO TABS: 40.0000 mg | ORAL_TABLET | Freq: Every day | ORAL | Status: DC

## 2013-08-16 MED ORDER — KETOROLAC TROMETHAMINE 60 MG/2ML IM SOLN
60.0000 mg | Freq: Once | INTRAMUSCULAR | Status: AC
  Administered 2013-08-16: 60 mg via INTRAMUSCULAR

## 2013-08-16 NOTE — Addendum Note (Signed)
Addended by: Denman George B on: 08/16/2013 08:19 AM   Modules accepted: Orders

## 2013-08-16 NOTE — Addendum Note (Signed)
Addended by: Denman George B on: 08/16/2013 10:50 AM   Modules accepted: Orders

## 2013-08-16 NOTE — Assessment & Plan Note (Signed)
Acute left ankle pain and swelling, probable gout.  Check uric acid level , toradol and depo medrol in office, followed by indomethacin and prednisone , short course Xray of ankle

## 2013-08-16 NOTE — Assessment & Plan Note (Signed)
Has decreased but unwilling to quit Patient counseled for approximately 5 minutes regarding the health risks of ongoing nicotine use, specifically all types of cancer, heart disease, stroke and respiratory failure. The options available for help with cessation ,the behavioral changes to assist the process, and the option to either gradully reduce usage  Or abruptly stop.is also discussed. Pt is also encouraged to set specific goals in number of cigarettes used daily, as well as to set a quit date.

## 2013-08-16 NOTE — Assessment & Plan Note (Signed)
2 week history. Venous doppler to r/o DVT

## 2013-08-16 NOTE — Assessment & Plan Note (Signed)
Controlled, no change in medication DASH diet and commitment to daily physical activity for a minimum of 30 minutes discussed and encouraged, as a part of hypertension management. The importance of attaining a healthy weight is also discussed.  

## 2013-08-16 NOTE — Progress Notes (Signed)
   Subjective:    Patient ID: Victoria Reyes, female    DOB: Jul 20, 1950, 63 y.o.   MRN: [id number]  HPI 2 week h/o increased left knee and ankle pain and swelling, has chronic pain in both joints but worse in recent times. No trauma 2 week h/o left leg swelling and pain, denies increased shortness of breath or hemoptysis Has cut down cigarettes with baby's arrival, but not ready to quit. States she feels very stressed at home as her sister in law has stayed for 5 month rather than 3 weeks   Review of Systems See HPI Denies recent fever or chills. Denies sinus pressure, nasal congestion, ear pain or sore throat. Denies chest congestion, productive cough or wheezing. Denies chest pains, palpitations and leg swelling Denies abdominal pain, nausea, vomiting,diarrhea or constipation.   Denies dysuria, frequency, hesitancy or incontinence.  Denies headaches, seizures, numbness, or tingling. Denies depression, or insomnia. Denies skin break down or rash.        Objective:   Physical Exam BP 130/84  Pulse 71  Resp 16  Wt 182 lb 6.4 oz (82.736 kg)  SpO2 99% Patient alert and oriented and in no cardiopulmonary distress.  HEENT: No facial asymmetry, EOMI, no sinus tenderness,  oropharynx pink and moist.  Neck supple no adenopathy.  Chest: Clear to auscultation bilaterally.Decreased though adequate air entry  CVS: S1, S2 no murmurs, no S3.  ABD: Soft non tender. Bowel sounds normal.  Ext: No edema  MS: Adequate ROM spine, shoulders, hips and reduced in left knee and left ankle. Left ankle excessively tender, swollen and deformed, mildly erythematous and warm . Left knee tender in posterior aspect  Skin: Intact, no ulcerations or rash noted.  Psych: Good eye contact, normal affect. Memory intact not anxious or depressed appearing.  CNS: CN 2-12 intact, power, tone and sensation normal throughout.        Assessment & Plan:  HYPOTHYROIDISM Over corrected, need to  reduce dose of med  Ankle pain, left Acute left ankle pain and swelling, probable gout.  Check uric acid level , toradol and depo medrol in office, followed by indomethacin and prednisone , short course Xray of ankle  Knee pain, left Left knee pain with pain posteriorly and instability, xray ans ortho eval Work excuse x 1 day  Left leg swelling 2 week history. Venous doppler to r/o DVT  TOBACCO USER Has decreased but unwilling to quit Patient counseled for approximately 5 minutes regarding the health risks of ongoing nicotine use, specifically all types of cancer, heart disease, stroke and respiratory failure. The options available for help with cessation ,the behavioral changes to assist the process, and the option to either gradully reduce usage  Or abruptly stop.is also discussed. Pt is also encouraged to set specific goals in number of cigarettes used daily, as well as to set a quit date.   HTN (hypertension) Controlled, no change in medication DASH diet and commitment to daily physical activity for a minimum of 30 minutes discussed and encouraged, as a part of hypertension management. The importance of attaining a healthy weight is also discussed.

## 2013-08-16 NOTE — Assessment & Plan Note (Signed)
Left knee pain with pain posteriorly and instability, xray ans ortho eval Work excuse x 1 day

## 2013-08-16 NOTE — Assessment & Plan Note (Signed)
Over corrected, need to reduce dose of med

## 2013-09-24 ENCOUNTER — Ambulatory Visit (INDEPENDENT_AMBULATORY_CARE_PROVIDER_SITE_OTHER): Payer: BC Managed Care – PPO

## 2013-09-24 ENCOUNTER — Telehealth: Payer: Self-pay

## 2013-09-24 VITALS — BP 138/82 | Wt 180.8 lb

## 2013-09-24 DIAGNOSIS — M549 Dorsalgia, unspecified: Secondary | ICD-10-CM

## 2013-09-24 DIAGNOSIS — M545 Low back pain, unspecified: Secondary | ICD-10-CM

## 2013-09-24 MED ORDER — KETOROLAC TROMETHAMINE 60 MG/2ML IM SOLN
60.0000 mg | Freq: Once | INTRAMUSCULAR | Status: AC
  Administered 2013-09-24: 60 mg via INTRAMUSCULAR

## 2013-09-24 NOTE — Addendum Note (Signed)
Addended by: Denman George B on: 09/24/2013 04:41 PM   Modules accepted: Orders

## 2013-09-24 NOTE — Telephone Encounter (Signed)
She may get toradol 60 mg IM  Today, also pls let her know I recommend an updated MRI of lumbar spine since last one in 08/2011 did not suggest as much problem as she is having now, pls order if she agrees , I will sign

## 2013-09-24 NOTE — Progress Notes (Signed)
toradol 60mg  given per Dr Moshe Cipro in [place] with no complications

## 2013-09-24 NOTE — Telephone Encounter (Signed)
Patient agrees to updated MRI.  Order placed.

## 2013-10-02 ENCOUNTER — Ambulatory Visit (HOSPITAL_COMMUNITY)
Admission: RE | Admit: 2013-10-02 | Discharge: 2013-10-02 | Disposition: A | Discharge status: Home / Self Care | Payer: BC Managed Care – PPO | Source: Ambulatory Visit | Attending: Family Medicine | Admitting: Family Medicine

## 2013-10-02 DIAGNOSIS — M545 Low back pain, unspecified: Secondary | ICD-10-CM | POA: Insufficient documentation

## 2013-10-02 DIAGNOSIS — M5126 Other intervertebral disc displacement, lumbar region: Secondary | ICD-10-CM | POA: Insufficient documentation

## 2013-10-09 ENCOUNTER — Ambulatory Visit (INDEPENDENT_AMBULATORY_CARE_PROVIDER_SITE_OTHER): Payer: BC Managed Care – PPO | Admitting: Gastroenterology

## 2013-10-09 ENCOUNTER — Encounter: Payer: Self-pay | Admitting: Gastroenterology

## 2013-10-09 VITALS — BP 130/72 | HR 70 | Temp 97.1°F | Resp 18 | Ht 61.5 in | Wt 181.0 lb

## 2013-10-09 DIAGNOSIS — Z1211 Encounter for screening for malignant neoplasm of colon: Secondary | ICD-10-CM

## 2013-10-09 NOTE — Progress Notes (Signed)
Patient presented today after receiving a recall letter for screening colonoscopy. However, her last colonoscopy was in 2012, and she is not due until 2022. She has no GI problems whatsoever. No visit performed today. Return prn. No charge.  Orvil Feil, ANP-BC [facility]

## 2013-10-09 NOTE — Patient Instructions (Signed)
Your next colonoscopy will be due in 2022.   Please call us if you have any problems in the meantime.   Otherwise, we will see you back on an as needed basis.  No charge for today. I am sorry for any inconvenience!

## 2013-10-25 ENCOUNTER — Ambulatory Visit: Payer: BC Managed Care – PPO | Admitting: Family Medicine

## 2013-10-28 ENCOUNTER — Telehealth: Payer: Self-pay | Admitting: Family Medicine

## 2013-10-28 DIAGNOSIS — E039 Hypothyroidism, unspecified: Secondary | ICD-10-CM

## 2013-10-28 DIAGNOSIS — H109 Unspecified conjunctivitis: Secondary | ICD-10-CM | POA: Insufficient documentation

## 2013-10-28 NOTE — Assessment & Plan Note (Signed)
Under corrected dose adjustment made with f/u lab

## 2013-10-28 NOTE — Assessment & Plan Note (Signed)
Unchanged. Patient re-educated about  the importance of commitment to a  minimum of 150 minutes of exercise per week. The importance of healthy food choices with portion control discussed. Encouraged to start a food diary, count calories and to consider  joining a support group. Sample diet sheets offered. Goals set by the patient for the next several months.    

## 2013-10-28 NOTE — Telephone Encounter (Signed)
Pt missed recent appt, she needs rept TSh pls order and let her know, mosdt recent TSh is uncontrolled Also will need to resched appt for 2 to 3 months, and will need  Fasting labs for that visit, ask her to call for that order nearer the visit

## 2013-10-28 NOTE — Assessment & Plan Note (Signed)
Reduced use but still has not quit, date set  Patient counseled for approximately 5 minutes regarding the health risks of ongoing nicotine use, specifically all types of cancer, heart disease, stroke and respiratory failure. The options available for help with cessation ,the behavioral changes to assist the process, and the option to either gradully reduce usage  Or abruptly stop.is also discussed. Pt is also encouraged to set specific goals in number of cigarettes used daily, as well as to set a quit date.

## 2013-10-28 NOTE — Assessment & Plan Note (Signed)
Improved, but lDL still elevated Hyperlipidemia:Low fat diet discussed and encouraged.  No med change Updated lab needed at/ before next visit.

## 2013-10-28 NOTE — Assessment & Plan Note (Signed)
antibioitic prescribe dand pt counseled to take as directed for effect, should return to opthalmology if symptoms persist or worsen

## 2013-10-28 NOTE — Assessment & Plan Note (Signed)
deteriorated Patient educated about the importance of limiting  Carbohydrate intake , the need to commit to daily physical activity for a minimum of 30 minutes , and to commit weight loss. The fact that changes in all these areas will reduce or eliminate all together the development of diabetes is stressed.    

## 2013-10-29 NOTE — Telephone Encounter (Signed)
Called patient and left message for them to return call at the office   

## 2013-11-07 NOTE — Telephone Encounter (Signed)
Needs tsh before she gets the toradol 60 mg im, she can get that after she has the lab

## 2013-11-07 NOTE — Telephone Encounter (Signed)
Patient rescheduled for August. Mailed lab order to her. Wants to know if she can come in next Wednesday for a pain shot for her hip like she usually gets. Please advise

## 2013-11-07 NOTE — Addendum Note (Signed)
Addended by: Eual Fines on: 11/07/2013 04:43 PM   Modules accepted: Orders

## 2013-11-24 LAB — TSH: TSH: 0.024 u[IU]/mL — AB (ref 0.350–4.500)

## 2013-11-25 ENCOUNTER — Encounter: Payer: Self-pay | Admitting: Family Medicine

## 2013-11-28 ENCOUNTER — Encounter: Payer: Self-pay | Admitting: Family Medicine

## 2013-11-28 ENCOUNTER — Ambulatory Visit (INDEPENDENT_AMBULATORY_CARE_PROVIDER_SITE_OTHER): Payer: BC Managed Care – PPO | Admitting: Family Medicine

## 2013-11-28 VITALS — BP 124/82 | HR 74 | Resp 16 | Ht 61.5 in | Wt 176.0 lb

## 2013-11-28 DIAGNOSIS — M5442 Lumbago with sciatica, left side: Secondary | ICD-10-CM

## 2013-11-28 DIAGNOSIS — E039 Hypothyroidism, unspecified: Secondary | ICD-10-CM

## 2013-11-28 DIAGNOSIS — Z1231 Encounter for screening mammogram for malignant neoplasm of breast: Secondary | ICD-10-CM

## 2013-11-28 DIAGNOSIS — I10 Essential (primary) hypertension: Secondary | ICD-10-CM

## 2013-11-28 DIAGNOSIS — M5430 Sciatica, unspecified side: Secondary | ICD-10-CM

## 2013-11-28 DIAGNOSIS — R7301 Impaired fasting glucose: Secondary | ICD-10-CM

## 2013-11-28 DIAGNOSIS — R7309 Other abnormal glucose: Secondary | ICD-10-CM

## 2013-11-28 DIAGNOSIS — F17200 Nicotine dependence, unspecified, uncomplicated: Secondary | ICD-10-CM

## 2013-11-28 DIAGNOSIS — H109 Unspecified conjunctivitis: Secondary | ICD-10-CM

## 2013-11-28 DIAGNOSIS — R7302 Impaired glucose tolerance (oral): Secondary | ICD-10-CM

## 2013-11-28 DIAGNOSIS — E785 Hyperlipidemia, unspecified: Secondary | ICD-10-CM

## 2013-11-28 DIAGNOSIS — E669 Obesity, unspecified: Secondary | ICD-10-CM

## 2013-11-28 NOTE — Patient Instructions (Addendum)
Annual physical; exam in early Novemebr, call if yopu need me before  HBA1C, Fasting lipid, cmp and EGFr, TSh in early Novemebr  Reconsider pneumonia vaccine  Pls work on smoking cessation, need to QUIT   You are referred for mammogram after 3 pm on a Wednesday  If possible   You are referred to eye specialist re red/irritated  Eyes.You NEED to call in with the name of the Doc you wish to see, it cannot be scheduled till you let staff know, pt declined referral before leaving, stating she can call in for her own appt 

## 2013-12-02 NOTE — Progress Notes (Signed)
   Subjective:    Patient ID: Victoria Reyes, female    DOB: 19-Oct-1950, 63 y.o.   MRN: [id number]  HPI The PT is here for follow up and re-evaluation of chronic medical conditions, medication management and review of any available recent lab and radiology data.  Preventive health is updated, specifically  Cancer screening and Immunization.   Mammogram still needs to be done The PT denies any adverse reactions to current medications since the last visit.  There are no new concerns.  Ongoing and chronic red eyes and back pian, no improvement   Review of Systems See HPI Denies recent fever or chills. Denies sinus pressure, nasal congestion, ear pain or sore throat. Denies chest congestion, productive cough or wheezing. Denies chest pains, palpitations and leg swelling Denies abdominal pain, nausea, vomiting,diarrhea or constipation.   Denies dysuria, frequency, hesitancy or incontinence.  Denies headaches, seizures, numbness, or tingling. Denies depression, anxiety or insomnia. Denies skin break down or rash.        Objective:   Physical Exam BP 124/82  Pulse 74  Resp 16  Ht 5' 1.5" (1.562 m)  Wt 176 lb (79.833 kg)  BMI 32.72 kg/m2  SpO2 96% Patient alert and oriented and in no cardiopulmonary distress.  HEENT: No facial asymmetry, EOMI,   oropharynx pink and moist.  Neck supple no JVD, no mass. Bilateral conjuntival  Eryhtema, no purulent drainage noted, pt denies pain Chest: Clear to auscultation bilaterally.Decreased air entry throughout  CVS: S1, S2 no murmurs, no S3.Regular rate.  ABD: Soft non tender.   Ext: No edema  MS: Adequate though reduced ROM spine ,normal in , shoulders, hips and knees.  Skin: Intact, no ulcerations or rash noted.  Psych: Good eye contact, normal affect. Memory intact not anxious or depressed appearing.  CNS: CN 2-12 intact, power,  normal throughout.no focal deficits noted.        Assessment & Plan:  HYPOTHYROIDISM Over  corrected, dose adjustment has already been made Updated lab needed at/ before next visit.   IGT (impaired glucose tolerance) Patient educated about the importance of limiting  Carbohydrate intake , the need to commit to daily physical activity for a minimum of 30 minutes , and to commit weight loss. The fact that changes in all these areas will reduce or eliminate all together the development of diabetes is stressed.     Conjunctivitis unspecified Unchanged, persistent bilateral conuctivitis, needs to get 2nd opthalmologic eval. Pt stated at end of visit that she wiill do this on her own after referral offer was made   Hyperlipidemia with target LDL less than 100 Uncontrolled Hyperlipidemia:Low fat diet discussed and encouraged.  Updated lab needed at/ before next visit.   TOBACCO USER unchnaged Patient counseled for approximately 5 minutes regarding the health risks of ongoing nicotine use, specifically all types of cancer, heart disease, stroke and respiratory failure. The options available for help with cessation ,the behavioral changes to assist the process, and the option to either gradully reduce usage  Or abruptly stop.is also discussed. Pt is also encouraged to set specific goals in number of cigarettes used daily, as well as to set a quit date. No plan to quit at this time  OBESITY Improved. Pt applauded on succesful weight loss through lifestyle change, and encouraged to continue same. Weight loss goal set for the next several months.   BACK PAIN, LUMBAR, CHRONIC Unchanged, pain management will be mainly with tylenol

## 2013-12-02 NOTE — Assessment & Plan Note (Signed)
Unchanged, pain management will be mainly with tylenol

## 2013-12-02 NOTE — Assessment & Plan Note (Signed)
Patient educated about the importance of limiting  Carbohydrate intake , the need to commit to daily physical activity for a minimum of 30 minutes , and to commit weight loss. The fact that changes in all these areas will reduce or eliminate all together the development of diabetes is stressed.    

## 2013-12-02 NOTE — Assessment & Plan Note (Signed)
Over corrected, dose adjustment has already been made Updated lab needed at/ before next visit.

## 2013-12-02 NOTE — Assessment & Plan Note (Signed)
Unchanged, persistent bilateral conuctivitis, needs to get 2nd opthalmologic eval. Pt stated at end of visit that she wiill do this on her own after referral offer was made

## 2013-12-02 NOTE — Assessment & Plan Note (Signed)
unchnaged Patient counseled for approximately 5 minutes regarding the health risks of ongoing nicotine use, specifically all types of cancer, heart disease, stroke and respiratory failure. The options available for help with cessation ,the behavioral changes to assist the process, and the option to either gradully reduce usage  Or abruptly stop.is also discussed. Pt is also encouraged to set specific goals in number of cigarettes used daily, as well as to set a quit date. No plan to quit at this time

## 2013-12-02 NOTE — Assessment & Plan Note (Signed)
Improved. Pt applauded on succesful weight loss through lifestyle change, and encouraged to continue same. Weight loss goal set for the next several months.  

## 2013-12-02 NOTE — Assessment & Plan Note (Signed)
Uncontrolled. Hyperlipidemia:Low fat diet discussed and encouraged.  Updated lab needed at/ before next visit.  

## 2013-12-24 ENCOUNTER — Ambulatory Visit (HOSPITAL_COMMUNITY)
Admission: RE | Admit: 2013-12-24 | Discharge: 2013-12-24 | Disposition: A | Discharge status: Home / Self Care | Payer: BC Managed Care – PPO | Source: Ambulatory Visit | Attending: Family Medicine | Admitting: Family Medicine

## 2013-12-24 DIAGNOSIS — Z1231 Encounter for screening mammogram for malignant neoplasm of breast: Secondary | ICD-10-CM | POA: Diagnosis not present

## 2013-12-27 ENCOUNTER — Other Ambulatory Visit: Payer: Self-pay | Admitting: Family Medicine

## 2013-12-27 DIAGNOSIS — R928 Other abnormal and inconclusive findings on diagnostic imaging of breast: Secondary | ICD-10-CM

## 2014-01-04 ENCOUNTER — Other Ambulatory Visit: Payer: Self-pay

## 2014-01-04 MED ORDER — VITAMIN D (ERGOCALCIFEROL) 1.25 MG (50000 UNIT) PO CAPS: 50000.0000 [IU] | ORAL_CAPSULE | ORAL | Status: DC

## 2014-01-08 ENCOUNTER — Other Ambulatory Visit: Payer: Self-pay | Admitting: Family Medicine

## 2014-01-08 ENCOUNTER — Ambulatory Visit (HOSPITAL_COMMUNITY)
Admission: RE | Admit: 2014-01-08 | Discharge: 2014-01-08 | Disposition: A | Discharge status: Home / Self Care | Payer: BC Managed Care – PPO | Source: Ambulatory Visit | Attending: Family Medicine | Admitting: Family Medicine

## 2014-01-08 DIAGNOSIS — IMO0002 Reserved for concepts with insufficient information to code with codable children: Secondary | ICD-10-CM

## 2014-01-08 DIAGNOSIS — R928 Other abnormal and inconclusive findings on diagnostic imaging of breast: Secondary | ICD-10-CM

## 2014-01-08 DIAGNOSIS — R229 Localized swelling, mass and lump, unspecified: Principal | ICD-10-CM

## 2014-02-07 ENCOUNTER — Ambulatory Visit (INDEPENDENT_AMBULATORY_CARE_PROVIDER_SITE_OTHER): Payer: BC Managed Care – PPO

## 2014-02-07 VITALS — BP 122/82 | Wt 177.0 lb

## 2014-02-07 DIAGNOSIS — M25552 Pain in left hip: Secondary | ICD-10-CM

## 2014-02-07 MED ORDER — KETOROLAC TROMETHAMINE 60 MG/2ML IM SOLN
60.0000 mg | Freq: Once | INTRAMUSCULAR | Status: AC
  Administered 2014-02-07: 60 mg via INTRAMUSCULAR

## 2014-02-07 MED ORDER — METHYLPREDNISOLONE ACETATE 80 MG/ML IJ SUSP
80.0000 mg | Freq: Once | INTRAMUSCULAR | Status: AC
  Administered 2014-02-07: 80 mg via INTRAMUSCULAR

## 2014-02-07 NOTE — Progress Notes (Signed)
Patient c/o left hip and leg pain x 1 week. Ok to give toradol 60 and depo 80 per Dr

## 2014-02-07 NOTE — Progress Notes (Signed)
   Subjective:    Patient ID: Victoria Reyes, female    DOB: June 30, 1950, 63 y.o.   MRN: [id number]  HPI    Review of Systems     Objective:   Physical Exam        Assessment & Plan:

## 2014-03-03 ENCOUNTER — Other Ambulatory Visit: Payer: Self-pay | Admitting: Family Medicine

## 2014-03-12 LAB — HEMOGLOBIN A1C
HEMOGLOBIN A1C: 6 % — AB (ref <5.7)
MEAN PLASMA GLUCOSE: 126 mg/dL — AB (ref <117)

## 2014-03-12 LAB — LIPID PANEL
Cholesterol: 172 mg/dL (ref 0–200)
HDL: 50 mg/dL (ref >39)
LDL CALC: 97 mg/dL (ref 0–99)
TRIGLYCERIDES: 127 mg/dL (ref <150)
Total CHOL/HDL Ratio: 3.4 Ratio
VLDL: 25 mg/dL (ref 0–40)

## 2014-03-12 LAB — COMPLETE METABOLIC PANEL WITH GFR
ALT: <8 U/L (ref 0–35)
AST: 10 U/L (ref 0–37)
Albumin: 3.4 g/dL — ABNORMAL LOW (ref 3.5–5.2)
Alkaline Phosphatase: 95 U/L (ref 39–117)
BILIRUBIN TOTAL: 0.4 mg/dL (ref 0.2–1.2)
BUN: 11 mg/dL (ref 6–23)
CO2: 26 mEq/L (ref 19–32)
Calcium: 8.7 mg/dL (ref 8.4–10.5)
Chloride: 107 mEq/L (ref 96–112)
Creat: 0.61 mg/dL (ref 0.50–1.10)
GFR, Est African American: >89 mL/min
GLUCOSE: 83 mg/dL (ref 70–99)
Potassium: 3.9 mEq/L (ref 3.5–5.3)
Sodium: 143 mEq/L (ref 135–145)
Total Protein: 5.5 g/dL — ABNORMAL LOW (ref 6.0–8.3)

## 2014-03-12 LAB — TSH: TSH: 0.921 u[IU]/mL (ref 0.350–4.500)

## 2014-03-19 ENCOUNTER — Encounter: Payer: BC Managed Care – PPO | Admitting: Family Medicine

## 2014-03-19 ENCOUNTER — Encounter: Payer: Self-pay | Admitting: *Deleted

## 2014-03-26 ENCOUNTER — Other Ambulatory Visit: Payer: Self-pay | Admitting: Family Medicine

## 2014-04-07 ENCOUNTER — Other Ambulatory Visit: Payer: Self-pay | Admitting: Family Medicine

## 2014-05-06 ENCOUNTER — Encounter: Payer: BC Managed Care – PPO | Admitting: Family Medicine

## 2014-06-03 ENCOUNTER — Other Ambulatory Visit: Payer: Self-pay | Admitting: Family Medicine

## 2014-06-03 DIAGNOSIS — Z09 Encounter for follow-up examination after completed treatment for conditions other than malignant neoplasm: Secondary | ICD-10-CM

## 2014-07-09 ENCOUNTER — Ambulatory Visit (HOSPITAL_COMMUNITY)
Admission: RE | Admit: 2014-07-09 | Discharge: 2014-07-09 | Disposition: A | Discharge status: Home / Self Care | Payer: BLUE CROSS/BLUE SHIELD | Source: Ambulatory Visit | Attending: Family Medicine | Admitting: Family Medicine

## 2014-07-09 ENCOUNTER — Other Ambulatory Visit: Payer: Self-pay | Admitting: Family Medicine

## 2014-07-09 DIAGNOSIS — Z09 Encounter for follow-up examination after completed treatment for conditions other than malignant neoplasm: Secondary | ICD-10-CM

## 2014-07-09 DIAGNOSIS — N63 Unspecified lump in breast: Secondary | ICD-10-CM | POA: Diagnosis not present

## 2014-07-13 ENCOUNTER — Other Ambulatory Visit: Payer: Self-pay | Admitting: Family Medicine

## 2014-07-16 ENCOUNTER — Ambulatory Visit (HOSPITAL_COMMUNITY): Payer: BLUE CROSS/BLUE SHIELD

## 2014-07-16 ENCOUNTER — Encounter (HOSPITAL_COMMUNITY): Payer: Self-pay

## 2014-09-03 ENCOUNTER — Encounter: Payer: Self-pay | Admitting: Family Medicine

## 2014-09-03 ENCOUNTER — Encounter: Payer: Self-pay | Admitting: *Deleted

## 2014-09-27 ENCOUNTER — Other Ambulatory Visit: Payer: Self-pay | Admitting: Family Medicine

## 2014-09-30 ENCOUNTER — Ambulatory Visit (INDEPENDENT_AMBULATORY_CARE_PROVIDER_SITE_OTHER): Payer: BLUE CROSS/BLUE SHIELD | Admitting: Family Medicine

## 2014-09-30 ENCOUNTER — Encounter: Payer: Self-pay | Admitting: Family Medicine

## 2014-09-30 VITALS — BP 126/80 | HR 72 | Resp 18 | Ht 61.5 in | Wt 172.0 lb

## 2014-09-30 DIAGNOSIS — E038 Other specified hypothyroidism: Secondary | ICD-10-CM

## 2014-09-30 DIAGNOSIS — R7302 Impaired glucose tolerance (oral): Secondary | ICD-10-CM

## 2014-09-30 DIAGNOSIS — I10 Essential (primary) hypertension: Secondary | ICD-10-CM | POA: Diagnosis not present

## 2014-09-30 DIAGNOSIS — M5442 Lumbago with sciatica, left side: Secondary | ICD-10-CM

## 2014-09-30 DIAGNOSIS — M792 Neuralgia and neuritis, unspecified: Secondary | ICD-10-CM

## 2014-09-30 DIAGNOSIS — Z720 Tobacco use: Secondary | ICD-10-CM

## 2014-09-30 DIAGNOSIS — B079 Viral wart, unspecified: Secondary | ICD-10-CM

## 2014-09-30 DIAGNOSIS — F17200 Nicotine dependence, unspecified, uncomplicated: Secondary | ICD-10-CM

## 2014-09-30 DIAGNOSIS — E785 Hyperlipidemia, unspecified: Secondary | ICD-10-CM

## 2014-09-30 DIAGNOSIS — E669 Obesity, unspecified: Secondary | ICD-10-CM

## 2014-09-30 MED ORDER — GABAPENTIN 300 MG PO CAPS: 300.0000 mg | ORAL_CAPSULE | Freq: Every day | ORAL | Status: DC

## 2014-09-30 MED ORDER — PREDNISONE 5 MG PO TABS: 5.0000 mg | ORAL_TABLET | Freq: Two times a day (BID) | ORAL | Status: DC

## 2014-09-30 NOTE — Assessment & Plan Note (Signed)

## 2014-09-30 NOTE — Assessment & Plan Note (Signed)
Enlarging wart on left thigh, refer for removal

## 2014-09-30 NOTE — Assessment & Plan Note (Signed)
Updated lab needed at/ before next visit. Victoria Reyes is reminded of the importance of commitment to daily physical activity for 30 minutes or more, as able and the need to limit carbohydrate intake to 30 to 60 grams per meal to help with blood sugar control.   The need to take medication as prescribed, test blood sugar as directed, and to call between visits if there is a concern that blood sugar is uncontrolled is also discussed.   Victoria Reyes is reminded of the importance of daily foot exam, annual eye examination, and good blood sugar, blood pressure and cholesterol control.  Diabetic Labs Latest Ref Rng 03/11/2014 06/11/2013 01/01/2013 05/04/2012 04/06/2012  HbA1c <5.7 % 6.0(H) 6.0(H) - 5.6 5.9(H)  Chol 0 - 200 mg/dL 172 188 215(H) 176 220(H)  HDL >39 mg/dL 50 61 58 51 62  Calc LDL 0 - 99 mg/dL 97 100(H) 128(H) 110(H) 133(H)  Triglycerides <150 mg/dL 127 133 143 76 126  Creatinine 0.50 - 1.10 mg/dL 0.61 0.62 0.61 0.69 0.70   BP/Weight 09/30/2014 02/07/2014 11/28/2013 10/09/2013 09/24/2013 05/13/3084 08/17/8467  Systolic BP 629 528 413 244 010 272 536  Diastolic BP 80 82 82 72 82 84 80  Wt. (Lbs) 172.04 177 176 181 180.8 182.4 191  BMI 31.98 32.91 32.72 33.65 32.52 32.81 34.36   No flowsheet data found.

## 2014-09-30 NOTE — Assessment & Plan Note (Addendum)
Updated lab needed , lab to be obtained today, past due Overcorrected, adjustment made pt to be contacted

## 2014-09-30 NOTE — Patient Instructions (Addendum)
Keep Novt appt, call if you need me before  Resume gabapentin and 5 day course of prednisone sent for back pain   Please work on quitting smoking, planto quit SOON  Labs today  Vit D is refilled  Please work on good  health habits so that your health will improve. 1. Commitment to daily physical activity for 30 to 60  minutes, if you are able to do this.  2. Commitment to wise food choices. Aim for half of your  food intake to be vegetable and fruit, one quarter starchy foods, and one quarter protein. Try to eat on a regular schedule  3 meals per day, snacking between meals should be limited to vegetables or fruits or small portions of nuts. 64 ounces of water per day is generally recommended, unless you have specific health conditions, like heart failure or kidney failure where you will need to limit fluid intake.  3. Commitment to sufficient and a  good quality of physical and mental rest daily, generally between 6 to 8 hours per day.  WITH PERSISTANCE AND PERSEVERANCE, THE IMPOSSIBLE , BECOMES THE NORM!  Thanks for choosing [facility], we consider it a privelige to serve you.  You are referred to Dr Arnoldo Morale re wart on left thigh and also to Dr Merlene Laughter to evaluate you for nerve damage to left leg

## 2014-09-30 NOTE — Assessment & Plan Note (Signed)
Improved. Patient re-educated about  the importance of commitment to a  minimum of 150 minutes of exercise per week.  The importance of healthy food choices with portion control discussed. Encouraged to start a food diary, count calories and to consider  joining a support group. Sample diet sheets offered. Goals set by the patient for the next several months.   Weight /BMI 09/30/2014 02/07/2014 11/28/2013  WEIGHT 172 lb 0.6 oz 177 lb 176 lb  HEIGHT 5' 1.5" - 5' 1.5"  BMI 31.98 kg/m2 32.91 kg/m2 32.72 kg/m2    Current exercise per week 120 minutes.

## 2014-09-30 NOTE — Progress Notes (Signed)
Victoria Reyes     MRN: [id number]      DOB: Jun 24, 1950   HPI Victoria Reyes is here for follow up and re-evaluation of chronic medical conditions, medication management and review of any available recent lab and radiology data.  Preventive health is updated, specifically  Cancer screening and Immunization.  Refuses recommended vaccines still Questions or concerns regarding consultations or procedures which the PT has had in the interim are  addressed. The PT denies any adverse reactions to current medications since the last visit.  Requests referral for removal of skin lesion, and also wants testing for possible nerve damage to left leg ROS Denies recent fever or chills. Denies sinus pressure, nasal congestion, ear pain or sore throat. Denies chest congestion, productive cough or wheezing. Denies chest pains, palpitations and leg swelling Denies abdominal pain, nausea, vomiting,diarrhea or constipation.   Denies dysuria, frequency, hesitancy or incontinence.  Denies headaches, seizures, numbness, or tingling. Denies depression, anxiety or insomnia.   PE  BP 126/80 mmHg  Pulse 72  Resp 18  Ht 5' 1.5" (1.562 m)  Wt 172 lb 0.6 oz (78.037 kg)  BMI 31.98 kg/m2  SpO2 98%  Patient alert and oriented and in no cardiopulmonary distress.  HEENT: No facial asymmetry, EOMI,   oropharynx pink and moist.  Neck supple no JVD, no mass.  Chest: Clear to auscultation bilaterally.Decreased though adequate air entry  CVS: S1, S2 no murmurs, no S3.Regular rate.  ABD: Soft non tender.   Ext: No edema  MS: Adequate ROM spine, shoulders, hips and knees.  Skin: Intact, hyperpigmented wart dia approx 1.5 cm on lateral left thigh, outer, no bleeding from lesion  Psych: Good eye contact, normal affect. Memory intact not anxious or depressed appearing.  CNS: CN 2-12 intact, power,  normal throughout.no focal deficits noted.   Assessment & Plan   HTN (hypertension) Controlled, no change  in medication DASH diet and commitment to daily physical activity for a minimum of 30 minutes discussed and encouraged, as a part of hypertension management. The importance of attaining a healthy weight is also discussed.  BP/Weight 09/30/2014 02/07/2014 11/28/2013 10/09/2013 09/24/2013 09/11/8313 04/18/6158  Systolic BP 737 106 269 485 462 703 500  Diastolic BP 80 82 82 72 82 84 80  Wt. (Lbs) 172.04 177 176 181 180.8 182.4 191  BMI 31.98 32.91 32.72 33.65 32.52 32.81 34.36        TOBACCO USER Patient counseled for approximately 5 minutes regarding the health risks of ongoing nicotine use, specifically all types of cancer, heart disease, stroke and respiratory failure. The options available for help with cessation ,the behavioral changes to assist the process, and the option to either gradully reduce usage  Or abruptly stop.is also discussed. Pt is also encouraged to set specific goals in number of cigarettes used daily, as well as to set a quit date.  Number of cigarettes/cigars currently smoking daily:8   Hypothyroidism Updated lab needed , lab to be obtained today, past due   BACK PAIN, LUMBAR, CHRONIC Uncontrolled, resume gabapentin and 5 day prednisone course sent in. Requests specific testing for nerve damage to left lower extremity which she relates to her surgery, will refer her to neurologist for evalaution  Obesity Improved. Patient re-educated about  the importance of commitment to a  minimum of 150 minutes of exercise per week.  The importance of healthy food choices with portion control discussed. Encouraged to start a food diary, count calories and to consider  joining a  support group. Sample diet sheets offered. Goals set by the patient for the next several months.   Weight /BMI 09/30/2014 02/07/2014 11/28/2013  WEIGHT 172 lb 0.6 oz 177 lb 176 lb  HEIGHT 5' 1.5" - 5' 1.5"  BMI 31.98 kg/m2 32.91 kg/m2 32.72 kg/m2    Current exercise per week 120 minutes.   IGT  (impaired glucose tolerance) Updated lab needed at/ before next visit. Victoria Reyes is reminded of the importance of commitment to daily physical activity for 30 minutes or more, as able and the need to limit carbohydrate intake to 30 to 60 grams per meal to help with blood sugar control.   The need to take medication as prescribed, test blood sugar as directed, and to call between visits if there is a concern that blood sugar is uncontrolled is also discussed.   Victoria Reyes is reminded of the importance of daily foot exam, annual eye examination, and good blood sugar, blood pressure and cholesterol control.  Diabetic Labs Latest Ref Rng 03/11/2014 06/11/2013 01/01/2013 05/04/2012 04/06/2012  HbA1c <5.7 % 6.0(H) 6.0(H) - 5.6 5.9(H)  Chol 0 - 200 mg/dL 172 188 215(H) 176 220(H)  HDL >39 mg/dL 50 61 58 51 62  Calc LDL 0 - 99 mg/dL 97 100(H) 128(H) 110(H) 133(H)  Triglycerides <150 mg/dL 127 133 143 76 126  Creatinine 0.50 - 1.10 mg/dL 0.61 0.62 0.61 0.69 0.70   BP/Weight 09/30/2014 02/07/2014 11/28/2013 10/09/2013 09/24/2013 12/17/2834 09/12/9474  Systolic BP 546 503 546 568 127 517 001  Diastolic BP 80 82 82 72 82 84 80  Wt. (Lbs) 172.04 177 176 181 180.8 182.4 191  BMI 31.98 32.91 32.72 33.65 32.52 32.81 34.36   No flowsheet data found.       Hyperlipidemia with target LDL less than 100 Hyperlipidemia:Low fat diet discussed and encouraged.   Lipid Panel  Lab Results  Component Value Date   CHOL 172 03/11/2014   HDL 50 03/11/2014   LDLCALC 97 03/11/2014   TRIG 127 03/11/2014   CHOLHDL 3.4 03/11/2014      Updated lab needed today   Cutaneous wart Enlarging wart on left thigh, refer for removal

## 2014-09-30 NOTE — Assessment & Plan Note (Addendum)
Uncontrolled, resume gabapentin and 5 day prednisone course sent in. Requests specific testing for nerve damage to left lower extremity which she relates to her surgery, will refer her to neurologist for evalaution

## 2014-09-30 NOTE — Assessment & Plan Note (Signed)
Controlled, no change in medication DASH diet and commitment to daily physical activity for a minimum of 30 minutes discussed and encouraged, as a part of hypertension management. The importance of attaining a healthy weight is also discussed.  BP/Weight 09/30/2014 02/07/2014 11/28/2013 10/09/2013 09/24/2013 05/17/5807 12/18/3380  Systolic BP 505 397 673 419 379 024 097  Diastolic BP 80 82 82 72 82 84 80  Wt. (Lbs) 172.04 177 176 181 180.8 182.4 191  BMI 31.98 32.91 32.72 33.65 32.52 32.81 34.36

## 2014-09-30 NOTE — Assessment & Plan Note (Signed)
Hyperlipidemia:Low fat diet discussed and encouraged.   Lipid Panel  Lab Results  Component Value Date   CHOL 172 03/11/2014   HDL 50 03/11/2014   LDLCALC 97 03/11/2014   TRIG 127 03/11/2014   CHOLHDL 3.4 03/11/2014      Updated lab needed today

## 2014-10-01 ENCOUNTER — Encounter: Payer: Self-pay | Admitting: Family Medicine

## 2014-10-01 LAB — CBC
HEMATOCRIT: 41.8 % (ref 36.0–46.0)
Hemoglobin: 13.7 g/dL (ref 12.0–15.0)
MCH: 28.1 pg (ref 26.0–34.0)
MCHC: 32.8 g/dL (ref 30.0–36.0)
MCV: 85.7 fL (ref 78.0–100.0)
MPV: 11.6 fL (ref 8.6–12.4)
Platelets: 269 10*3/uL (ref 150–400)
RBC: 4.88 MIL/uL (ref 3.87–5.11)
RDW: 15.2 % (ref 11.5–15.5)
WBC: 11.5 10*3/uL — ABNORMAL HIGH (ref 4.0–10.5)

## 2014-10-01 LAB — COMPLETE METABOLIC PANEL WITH GFR
ALK PHOS: 126 U/L — AB (ref 39–117)
ALT: 9 U/L (ref 0–35)
AST: 14 U/L (ref 0–37)
Albumin: 3.9 g/dL (ref 3.5–5.2)
BILIRUBIN TOTAL: 0.4 mg/dL (ref 0.2–1.2)
BUN: 11 mg/dL (ref 6–23)
CALCIUM: 9.2 mg/dL (ref 8.4–10.5)
CO2: 26 mEq/L (ref 19–32)
Chloride: 106 mEq/L (ref 96–112)
Creat: 0.58 mg/dL (ref 0.50–1.10)
GFR, Est Non African American: >89 mL/min
Glucose, Bld: 81 mg/dL (ref 70–99)
Potassium: 4.4 mEq/L (ref 3.5–5.3)
Sodium: 144 mEq/L (ref 135–145)
Total Protein: 6.4 g/dL (ref 6.0–8.3)

## 2014-10-01 LAB — TSH: TSH: 0.257 u[IU]/mL — AB (ref 0.350–4.500)

## 2014-10-01 LAB — LIPID PANEL
CHOL/HDL RATIO: 3 ratio
Cholesterol: 195 mg/dL (ref 0–200)
HDL: 64 mg/dL (ref >=46)
LDL CALC: 92 mg/dL (ref 0–99)
Triglycerides: 195 mg/dL — ABNORMAL HIGH (ref <150)
VLDL: 39 mg/dL (ref 0–40)

## 2014-10-01 LAB — HEMOGLOBIN A1C
HEMOGLOBIN A1C: 5.8 % — AB (ref <5.7)
Mean Plasma Glucose: 120 mg/dL — ABNORMAL HIGH (ref <117)

## 2014-10-02 ENCOUNTER — Encounter: Payer: Self-pay | Admitting: Family Medicine

## 2014-10-02 NOTE — Addendum Note (Signed)
Addended by: Denman George B on: 10/02/2014 03:58 PM   Modules accepted: Orders

## 2014-10-02 NOTE — Addendum Note (Signed)
Addended by: Fayrene Helper on: 10/02/2014 09:29 AM   Modules accepted: Medications

## 2014-10-26 ENCOUNTER — Other Ambulatory Visit: Payer: Self-pay | Admitting: Family Medicine

## 2014-10-28 ENCOUNTER — Other Ambulatory Visit: Payer: Self-pay

## 2014-10-28 MED ORDER — LEVOTHYROXINE SODIUM 100 MCG PO TABS: 100.0000 ug | ORAL_TABLET | Freq: Every day | ORAL | Status: DC

## 2014-11-14 ENCOUNTER — Telehealth: Payer: Self-pay

## 2014-11-14 NOTE — Telephone Encounter (Signed)
error 

## 2014-12-17 ENCOUNTER — Other Ambulatory Visit: Payer: Self-pay | Admitting: Family Medicine

## 2014-12-17 DIAGNOSIS — N6310 Unspecified lump in the right breast, unspecified quadrant: Secondary | ICD-10-CM

## 2014-12-17 DIAGNOSIS — Z09 Encounter for follow-up examination after completed treatment for conditions other than malignant neoplasm: Secondary | ICD-10-CM

## 2014-12-19 ENCOUNTER — Other Ambulatory Visit: Payer: Self-pay | Admitting: Family Medicine

## 2015-01-14 ENCOUNTER — Ambulatory Visit (HOSPITAL_COMMUNITY)
Admission: RE | Admit: 2015-01-14 | Discharge: 2015-01-14 | Disposition: A | Discharge status: Home / Self Care | Payer: BLUE CROSS/BLUE SHIELD | Source: Ambulatory Visit | Attending: Family Medicine | Admitting: Family Medicine

## 2015-01-14 ENCOUNTER — Encounter (HOSPITAL_COMMUNITY): Payer: BLUE CROSS/BLUE SHIELD

## 2015-01-14 DIAGNOSIS — N6310 Unspecified lump in the right breast, unspecified quadrant: Secondary | ICD-10-CM

## 2015-01-14 DIAGNOSIS — N63 Unspecified lump in breast: Secondary | ICD-10-CM | POA: Diagnosis not present

## 2015-01-14 DIAGNOSIS — Z09 Encounter for follow-up examination after completed treatment for conditions other than malignant neoplasm: Secondary | ICD-10-CM

## 2015-02-08 ENCOUNTER — Other Ambulatory Visit: Payer: Self-pay | Admitting: Family Medicine

## 2015-02-20 ENCOUNTER — Encounter: Payer: BLUE CROSS/BLUE SHIELD | Admitting: Family Medicine

## 2015-04-03 ENCOUNTER — Other Ambulatory Visit: Payer: Self-pay | Admitting: Family Medicine

## 2015-05-13 ENCOUNTER — Other Ambulatory Visit: Payer: Self-pay | Admitting: Family Medicine

## 2015-05-17 ENCOUNTER — Other Ambulatory Visit: Payer: Self-pay | Admitting: Family Medicine

## 2015-07-01 ENCOUNTER — Encounter: Payer: BLUE CROSS/BLUE SHIELD | Admitting: Family Medicine

## 2015-07-02 ENCOUNTER — Ambulatory Visit (INDEPENDENT_AMBULATORY_CARE_PROVIDER_SITE_OTHER): Payer: BLUE CROSS/BLUE SHIELD | Admitting: Family Medicine

## 2015-07-02 ENCOUNTER — Encounter: Payer: Self-pay | Admitting: Family Medicine

## 2015-07-02 VITALS — BP 128/78 | HR 73 | Resp 16 | Ht 61.5 in | Wt 179.0 lb

## 2015-07-02 DIAGNOSIS — F17200 Nicotine dependence, unspecified, uncomplicated: Secondary | ICD-10-CM

## 2015-07-02 DIAGNOSIS — R42 Dizziness and giddiness: Secondary | ICD-10-CM | POA: Diagnosis not present

## 2015-07-02 DIAGNOSIS — R110 Nausea: Secondary | ICD-10-CM

## 2015-07-02 DIAGNOSIS — E785 Hyperlipidemia, unspecified: Secondary | ICD-10-CM

## 2015-07-02 DIAGNOSIS — I10 Essential (primary) hypertension: Secondary | ICD-10-CM

## 2015-07-02 DIAGNOSIS — E038 Other specified hypothyroidism: Secondary | ICD-10-CM | POA: Diagnosis not present

## 2015-07-02 DIAGNOSIS — Z1159 Encounter for screening for other viral diseases: Secondary | ICD-10-CM

## 2015-07-02 DIAGNOSIS — R7302 Impaired glucose tolerance (oral): Secondary | ICD-10-CM

## 2015-07-02 DIAGNOSIS — E669 Obesity, unspecified: Secondary | ICD-10-CM

## 2015-07-02 MED ORDER — ONDANSETRON HCL 4 MG PO TABS: 4.0000 mg | ORAL_TABLET | Freq: Three times a day (TID) | ORAL | Status: DC | PRN

## 2015-07-02 MED ORDER — MECLIZINE HCL 25 MG PO TABS: 25.0000 mg | ORAL_TABLET | Freq: Three times a day (TID) | ORAL | Status: DC | PRN

## 2015-07-02 NOTE — Assessment & Plan Note (Signed)

## 2015-07-02 NOTE — Assessment & Plan Note (Signed)
2 day history, acute onset,. No preceding viral illness

## 2015-07-02 NOTE — Patient Instructions (Signed)
Annual exam as before Call if you need me sooner  You are treated for acute vertigo  Medications are prescribed for management of your symptoms until this goes away  Work excuse from today, return 07/08/2015  Please cut back on cigaretes, you can quit by your [birth year] birthday!  Vertigo Vertigo means that you feel like you are moving when you are not. Vertigo can also make you feel like things around you are moving when they are not. This feeling can come and go at any time. Vertigo often goes away on its own. HOME CARE  Avoid making fast movements.  Avoid driving.  Avoid using heavy machinery.  Avoid doing any task or activity that might cause danger to you or other people if you would have a vertigo attack while you are doing it.  Sit down right away if you feel dizzy or have trouble with your balance.  Take over-the-counter and prescription medicines only as told by your doctor.  Follow instructions from your doctor about which positions or movements you should avoid.  Drink enough fluid to keep your pee (urine) clear or pale yellow.  Keep all follow-up visits as told by your doctor. This is important. GET HELP IF:  Medicine does not help your vertigo.  You have a fever.  Your problems get worse or you have new symptoms.  Your family or friends see changes in your behavior.  You feel sick to your stomach (nauseous) or you throw up (vomit).  You have a "pins and needles" feeling or you are numb in part of your body. GET HELP RIGHT AWAY IF:  You have trouble moving or talking.  You are always dizzy.  You pass out (faint).  You get very bad headaches.  You feel weak or have trouble using your hands, arms, or legs.  You have changes in your hearing.  You have changes in your seeing (vision).  You get a stiff neck.  Bright light starts to bother you.   This information is not intended to replace advice given to you by your health care provider. Make sure you  discuss any questions you have with your health care provider.   Document Released: 01/06/2008 Document Revised: 12/18/2014 Document Reviewed: 07/22/2014 Elsevier Interactive Patient Education [employer].

## 2015-07-04 LAB — CBC WITH DIFFERENTIAL/PLATELET
BASOS ABS: 0.1 10*3/uL (ref 0.0–0.1)
Basophils Relative: 1 % (ref 0–1)
Eosinophils Absolute: 0.4 10*3/uL (ref 0.0–0.7)
Eosinophils Relative: 4 % (ref 0–5)
HEMATOCRIT: 41 % (ref 36.0–46.0)
HEMOGLOBIN: 13.2 g/dL (ref 12.0–15.0)
LYMPHS ABS: 4.3 10*3/uL — AB (ref 0.7–4.0)
LYMPHS PCT: 45 % (ref 12–46)
MCH: 28.1 pg (ref 26.0–34.0)
MCHC: 32.2 g/dL (ref 30.0–36.0)
MCV: 87.4 fL (ref 78.0–100.0)
MPV: 11.6 fL (ref 8.6–12.4)
Monocytes Absolute: 0.7 10*3/uL (ref 0.1–1.0)
Monocytes Relative: 7 % (ref 3–12)
NEUTROS ABS: 4.1 10*3/uL (ref 1.7–7.7)
NEUTROS PCT: 43 % (ref 43–77)
Platelets: 327 10*3/uL (ref 150–400)
RBC: 4.69 MIL/uL (ref 3.87–5.11)
RDW: 14.4 % (ref 11.5–15.5)
WBC: 9.6 10*3/uL (ref 4.0–10.5)

## 2015-07-04 LAB — TSH: TSH: 0.74 m[IU]/L

## 2015-07-04 LAB — HEMOGLOBIN A1C
Hgb A1c MFr Bld: 5.7 % — ABNORMAL HIGH (ref <5.7)
Mean Plasma Glucose: 117 mg/dL — ABNORMAL HIGH (ref <117)

## 2015-07-04 LAB — HEPATITIS C ANTIBODY: HCV Ab: NEGATIVE

## 2015-07-04 LAB — HIV ANTIBODY (ROUTINE TESTING W REFLEX): HIV: NONREACTIVE

## 2015-07-05 ENCOUNTER — Other Ambulatory Visit: Payer: Self-pay | Admitting: Family Medicine

## 2015-07-05 ENCOUNTER — Encounter: Payer: Self-pay | Admitting: Family Medicine

## 2015-07-05 DIAGNOSIS — R110 Nausea: Secondary | ICD-10-CM | POA: Insufficient documentation

## 2015-07-05 LAB — COMPLETE METABOLIC PANEL WITH GFR
ALT: 7 U/L (ref 6–29)
AST: 11 U/L (ref 10–35)
Albumin: 3.6 g/dL (ref 3.6–5.1)
Alkaline Phosphatase: 99 U/L (ref 33–130)
BILIRUBIN TOTAL: 0.5 mg/dL (ref 0.2–1.2)
BUN: 12 mg/dL (ref 7–25)
CALCIUM: 8.7 mg/dL (ref 8.6–10.4)
CHLORIDE: 107 mmol/L (ref 98–110)
CO2: 26 mmol/L (ref 20–31)
CREATININE: 0.72 mg/dL (ref 0.50–0.99)
GFR, Est Non African American: 88 mL/min (ref >=60)
Glucose, Bld: 80 mg/dL (ref 65–99)
Potassium: 4.2 mmol/L (ref 3.5–5.3)
Sodium: 145 mmol/L (ref 135–146)
TOTAL PROTEIN: 6 g/dL — AB (ref 6.1–8.1)

## 2015-07-05 LAB — LIPID PANEL
CHOL/HDL RATIO: 3.3 ratio (ref <=5.0)
Cholesterol: 199 mg/dL (ref 125–200)
HDL: 60 mg/dL (ref >=46)
LDL Cholesterol: 114 mg/dL (ref <130)
TRIGLYCERIDES: 126 mg/dL (ref <150)
VLDL: 25 mg/dL (ref <30)

## 2015-07-05 NOTE — Assessment & Plan Note (Signed)
Hyperlipidemia:Low fat diet discussed and encouraged.   Lipid Panel  Lab Results  Component Value Date   CHOL 199 07/02/2015   HDL 60 07/02/2015   LDLCALC 114 07/02/2015   TRIG 126 07/02/2015   CHOLHDL 3.3 07/02/2015   Not at goal will discuss at next visit

## 2015-07-05 NOTE — Progress Notes (Signed)
Subjective:    Patient ID: Victoria Reyes, female    DOB: 28-Aug-1950, 65 y.o.   MRN: [id number]  HPI   Victoria Reyes     MRN: [id number]      DOB: 04-May-1950   HPI Victoria Reyes is here for follow up and re-evaluation of chronic medical conditions, medication management and review of any available recent lab and radiology data.  Preventive health is updated, specifically  Cancer screening and Immunization.   Questions or concerns regarding consultations or procedures which the PT has had in the interim are  addressed. The PT denies any adverse reactions to current medications since the last visit.  2 day h/o vertigo  Which is disabling, no recent viral illness, also has nausea  ROS Denies recent fever or chills. Denies sinus pressure, nasal congestion, ear pain or sore throat. Denies chest congestion, productive cough or wheezing. Denies chest pains, palpitations and leg swelling Denies abdominal pain, diarrhea or constipation. C/o nausea, no vomiting Denies dysuria, frequency, hesitancy or incontinence. Denies joint pain, swelling and limitation in mobility. Denies headaches, seizures, numbness, or tingling. Denies depression, anxiety or insomnia. Denies skin break down or rash.   PE  BP 128/78 mmHg  Pulse 73  Resp 16  Ht 5' 1.5" (1.562 m)  Wt 179 lb (81.194 kg)  BMI 33.28 kg/m2  SpO2 97%  Patient alert and oriented and in no cardiopulmonary distress.  HEENT: No facial asymmetry, EOMI,   oropharynx pink and moist.  Neck supple no JVD, no mass. No nystagmus Chest: Clear to auscultation bilaterally.  CVS: S1, S2 no murmurs, no S3.Regular rate.  ABD: Soft non tender.   Ext: No edema, normal bowel sounds  MS: Adequate ROM spine, shoulders, hips and knees.  Skin: Intact, no ulcerations or rash noted.  Psych: Good eye contact, normal affect. Memory intact not anxious or depressed appearing.  CNS: CN 2-12 intact, power,  normal throughout.no focal deficits  noted.   Assessment & Plan  Vertigo 2 day history, acute onset,. No preceding viral illness  TOBACCO USER Patient counseled for approximately 5 minutes regarding the health risks of ongoing nicotine use, specifically all types of cancer, heart disease, stroke and respiratory failure. The options available for help with cessation ,the behavioral changes to assist the process, and the option to either gradully reduce usage  Or abruptly stop.is also discussed. Pt is also encouraged to set specific goals in number of cigarettes used daily, as well as to set a quit date.  Number of cigarettes/cigars currently smoking daily: 5  HTN (hypertension) Controlled, no change in medication DASH diet and commitment to daily physical activity for a minimum of 30 minutes discussed and encouraged, as a part of hypertension management. The importance of attaining a healthy weight is also discussed.  BP/Weight 07/02/2015 09/30/2014 02/07/2014 11/28/2013 10/09/2013 0000000 [id number]  Systolic BP 0000000 [id number] [id number]  Diastolic BP 78 80 82 82 72 82 84  Wt. (Lbs) 179 172.04 177 176 181 180.8 182.4  BMI 33.28 31.98 32.91 32.72 33.65 32.52 32.81        Nausea without vomiting Acute vertigo with nausea, zofran prescribed fore as needed use, work excuse also  Hyperlipidemia with target LDL less than 100 Hyperlipidemia:Low fat diet discussed and encouraged.   Lipid Panel  Lab Results  Component Value Date   CHOL 199 07/02/2015   HDL 60 07/02/2015   LDLCALC 114 07/02/2015   TRIG 126 07/02/2015   CHOLHDL 3.3 07/02/2015  Not at goal will discuss at next visit      IGT (impaired glucose tolerance) Patient educated about the importance of limiting  Carbohydrate intake , the need to commit to daily physical activity for a minimum of 30 minutes , and to commit weight loss. The fact that changes in all these areas will reduce or eliminate all together the development of diabetes is stressed.    Improved , which is good  Diabetic Labs Latest Ref Rng 07/02/2015 09/30/2014 03/11/2014 06/11/2013 01/01/2013  HbA1c <5.7 % 5.7(H) 5.8(H) 6.0(H) 6.0(H) -  Chol 125 - 200 mg/dL 199 195 172 188 215(H)  HDL >=46 mg/dL 60 64 50 61 58  Calc LDL <130 mg/dL 114 92 97 100(H) 128(H)  Triglycerides <150 mg/dL 126 195(H) 127 133 143  Creatinine 0.50 - 0.99 mg/dL 0.72 0.58 0.61 0.62 0.61   BP/Weight 07/02/2015 09/30/2014 02/07/2014 11/28/2013 10/09/2013 0000000 [id number]  Systolic BP 0000000 [id number]  Diastolic BP 78 80 82 82 72 82 84  Wt. (Lbs) 179 172.04 177 176 181 180.8 182.4  BMI 33.28 31.98 32.91 32.72 33.65 32.52 32.81   No flowsheet data found.     Obesity Deteriorated. Patient re-educated about  the importance of commitment to a  minimum of 150 minutes of exercise per week.  The importance of healthy food choices with portion control discussed. Encouraged to start a food diary, count calories and to consider  joining a support group. Sample diet sheets offered. Goals set by the patient for the next several months.   Weight /BMI 07/02/2015 09/30/2014 02/07/2014  WEIGHT 179 lb 172 lb 0.6 oz 177 lb  HEIGHT 5' 1.5" 5' 1.5" -  BMI 33.28 kg/m2 31.98 kg/m2 32.91 kg/m2             Review of Systems     Objective:   Physical Exam        Assessment & Plan:

## 2015-07-05 NOTE — Assessment & Plan Note (Signed)
Acute vertigo with nausea, zofran prescribed fore as needed use, work excuse also

## 2015-07-05 NOTE — Assessment & Plan Note (Signed)
Deteriorated. Patient re-educated about  the importance of commitment to a  minimum of 150 minutes of exercise per week.  The importance of healthy food choices with portion control discussed. Encouraged to start a food diary, count calories and to consider  joining a support group. Sample diet sheets offered. Goals set by the patient for the next several months.   Weight /BMI 07/02/2015 09/30/2014 02/07/2014  WEIGHT 179 lb 172 lb 0.6 oz 177 lb  HEIGHT 5' 1.5" 5' 1.5" -  BMI 33.28 kg/m2 31.98 kg/m2 32.91 kg/m2

## 2015-07-05 NOTE — Assessment & Plan Note (Signed)
Patient educated about the importance of limiting  Carbohydrate intake , the need to commit to daily physical activity for a minimum of 30 minutes , and to commit weight loss. The fact that changes in all these areas will reduce or eliminate all together the development of diabetes is stressed.  Improved , which is good  Diabetic Labs Latest Ref Rng 07/02/2015 09/30/2014 03/11/2014 06/11/2013 01/01/2013  HbA1c <5.7 % 5.7(H) 5.8(H) 6.0(H) 6.0(H) -  Chol 125 - 200 mg/dL 199 195 172 188 215(H)  HDL >=46 mg/dL 60 64 50 61 58  Calc LDL <130 mg/dL 114 92 97 100(H) 128(H)  Triglycerides <150 mg/dL 126 195(H) 127 133 143  Creatinine 0.50 - 0.99 mg/dL 0.72 0.58 0.61 0.62 0.61   BP/Weight 07/02/2015 09/30/2014 02/07/2014 11/28/2013 10/09/2013 0000000 [id number]  Systolic BP 0000000 [id number] [id number] 0000000 [id number]  Diastolic BP 78 80 82 82 72 82 84  Wt. (Lbs) 179 172.04 177 176 181 180.8 182.4  BMI 33.28 31.98 32.91 32.72 33.65 32.52 32.81   No flowsheet data found.

## 2015-07-05 NOTE — Assessment & Plan Note (Signed)
Controlled, no change in medication DASH diet and commitment to daily physical activity for a minimum of 30 minutes discussed and encouraged, as a part of hypertension management. The importance of attaining a healthy weight is also discussed.  BP/Weight 07/02/2015 09/30/2014 02/07/2014 11/28/2013 10/09/2013 0000000 [id number]  Systolic BP 0000000 [id number] [id number] 0000000 [id number]  Diastolic BP 78 80 82 82 72 82 84  Wt. (Lbs) 179 172.04 177 176 181 180.8 182.4  BMI 33.28 31.98 32.91 32.72 33.65 32.52 32.81

## 2015-07-09 ENCOUNTER — Ambulatory Visit (INDEPENDENT_AMBULATORY_CARE_PROVIDER_SITE_OTHER): Payer: BLUE CROSS/BLUE SHIELD | Admitting: Family Medicine

## 2015-07-09 ENCOUNTER — Encounter: Payer: Self-pay | Admitting: Family Medicine

## 2015-07-09 ENCOUNTER — Other Ambulatory Visit (HOSPITAL_COMMUNITY)
Admission: RE | Admit: 2015-07-09 | Discharge: 2015-07-09 | Disposition: A | Discharge status: Home / Self Care | Payer: BLUE CROSS/BLUE SHIELD | Source: Ambulatory Visit | Attending: Family Medicine | Admitting: Family Medicine

## 2015-07-09 VITALS — BP 130/82 | HR 87 | Resp 16 | Ht 62.0 in | Wt 183.0 lb

## 2015-07-09 DIAGNOSIS — E038 Other specified hypothyroidism: Secondary | ICD-10-CM

## 2015-07-09 DIAGNOSIS — Z1211 Encounter for screening for malignant neoplasm of colon: Secondary | ICD-10-CM | POA: Diagnosis not present

## 2015-07-09 DIAGNOSIS — Z0000 Encounter for general adult medical examination without abnormal findings: Secondary | ICD-10-CM

## 2015-07-09 DIAGNOSIS — Z01419 Encounter for gynecological examination (general) (routine) without abnormal findings: Secondary | ICD-10-CM | POA: Diagnosis not present

## 2015-07-09 DIAGNOSIS — F17200 Nicotine dependence, unspecified, uncomplicated: Secondary | ICD-10-CM

## 2015-07-09 DIAGNOSIS — E559 Vitamin D deficiency, unspecified: Secondary | ICD-10-CM

## 2015-07-09 DIAGNOSIS — R7302 Impaired glucose tolerance (oral): Secondary | ICD-10-CM

## 2015-07-09 DIAGNOSIS — I10 Essential (primary) hypertension: Secondary | ICD-10-CM | POA: Diagnosis not present

## 2015-07-09 DIAGNOSIS — E785 Hyperlipidemia, unspecified: Secondary | ICD-10-CM | POA: Diagnosis not present

## 2015-07-09 DIAGNOSIS — Z124 Encounter for screening for malignant neoplasm of cervix: Secondary | ICD-10-CM

## 2015-07-09 LAB — POC HEMOCCULT BLD/STL (OFFICE/1-CARD/DIAGNOSTIC): Fecal Occult Blood, POC: NEGATIVE

## 2015-07-09 MED ORDER — GABAPENTIN 300 MG PO CAPS: ORAL_CAPSULE | ORAL | Status: DC

## 2015-07-09 MED ORDER — LEVOTHYROXINE SODIUM 100 MCG PO TABS: ORAL_TABLET | ORAL | Status: DC

## 2015-07-09 MED ORDER — AMLODIPINE BESYLATE 5 MG PO TABS: ORAL_TABLET | ORAL | Status: DC

## 2015-07-09 NOTE — Patient Instructions (Signed)
F/u in 5 .5 month, call if you need me sooner  Fasting lipid, cmp and eGFR, HBA1C, vit D and TSH in 5.5 months  Work on stopping smoking, now at 3 per day  Compression hose, knee high for left leg written  Please work on good  health habits so that your health will improve. 1. Commitment to daily physical activity for 30 to 60  minutes, if you are able to do this.  2. Commitment to wise food choices. Aim for half of your  food intake to be vegetable and fruit, one quarter starchy foods, and one quarter protein. Try to eat on a regular schedule  3 meals per day, snacking between meals should be limited to vegetables or fruits or small portions of nuts. 64 ounces of water per day is generally recommended, unless you have specific health conditions, like heart failure or kidney failure where you will need to limit fluid intake.  3. Commitment to sufficient and a  good quality of physical and mental rest daily, generally between 6 to 8 hours per day.  WITH PERSISTANCE AND PERSEVERANCE, THE IMPOSSIBLE , BECOMES THE NORM! Thanks for choosing [facility], we consider it a privelige to serve you.

## 2015-07-09 NOTE — Progress Notes (Signed)
   Subjective:    Patient ID: Victoria Reyes, female    DOB: 1950/07/14, 65 y.o.   MRN: [id number]  HPI Patient is in for annual physical exam. C/o left leg swelling , and c/o nerve damage which she continues to attribute to back surgery. Recent labs, if available are reviewed. Immunization is reviewed , and  updated if needed.   Review of Systems See HPI      Objective:   Physical Exam BP 130/82 mmHg  Pulse 87  Resp 16  Ht 5\' 2"  (1.575 m)  Wt 183 lb (83.008 kg)  BMI 33.46 kg/m2  SpO2 99%  Pleasant well nourished female, alert and oriented x 3, in no cardio-pulmonary distress. Afebrile. HEENT No facial trauma or asymetry. Sinuses non tender.  Extra occullar muscles intact, pupils equally reactive to light. External ears normal, tympanic membranes clear. Oropharynx moist, no exudate, good dentition. Neck: supple, no adenopathy,JVD or thyromegaly.No bruits.  Chest: Clear to ascultation bilaterally.No crackles or wheezes. Non tender to palpation  Breast: No asymetry,no masses or lumps. No tenderness. No nipple discharge or inversion. No axillary or supraclavicular adenopathy  Cardiovascular system; Heart sounds normal,  S1 and  S2 ,no S3.  No murmur, or thrill. Apical beat not displaced Peripheral pulses normal.  Abdomen: Soft, non tender, no organomegaly or masses. No bruits. Bowel sounds normal. No guarding, tenderness or rebound.  Rectal:  Normal sphincter tone. No mass.No rectal masses.  Guaiac negative stool.  GU: External genitalia normal female genitalia , female distribution of hair. No lesions. Urethral meatus normal in size, no  Prolapse, no lesions visibly  Present. Bladder non tender. Vagina pink and moist , with no visible lesions , discharge present . Adequate pelvic support no  cystocele or rectocele noted Cervix pink and appears healthy, no lesions or ulcerations noted, no discharge noted from os Uterus normal size, no adnexal masses, no  cervical motion or adnexal tenderness.   Musculoskeletal exam: Full ROM of spine, hips , shoulders and knees. No deformity ,swelling or crepitus noted. No muscle wasting or atrophy.   Neurologic: Cranial nerves 2 to 12 intact. Power, tone ,sensation and reflexes normal throughout. No disturbance in gait. No tremor.  Skin: Intact, no ulceration, erythema , scaling or rash noted. Pigmentation normal throughout  Psych; Normal mood and affect. Judgement and concentration normal       Assessment & Plan:  Annual physical exam Annual exam as documented. Counseling done  re healthy lifestyle involving commitment to 150 minutes exercise per week, heart healthy diet, and attaining healthy weight.The importance of adequate sleep also discussed. Regular seat belt use and home safety, is also discussed. Changes in health habits are decided on by the patient with goals and time frames  set for achieving them. Immunization and cancer screening needs are specifically addressed at this visit.   TOBACCO USER Patient counseled for approximately 5 minutes regarding the health risks of ongoing nicotine use, specifically all types of cancer, heart disease, stroke and respiratory failure. The options available for help with cessation ,the behavioral changes to assist the process, and the option to either gradully reduce usage  Or abruptly stop.is also discussed. Pt is also encouraged to set specific goals in number of cigarettes used daily, as well as to set a quit date.  Number of cigarettes/cigars currently smoking daily: 3

## 2015-07-10 ENCOUNTER — Encounter: Payer: Self-pay | Admitting: Family Medicine

## 2015-07-10 DIAGNOSIS — Z0000 Encounter for general adult medical examination without abnormal findings: Secondary | ICD-10-CM | POA: Insufficient documentation

## 2015-07-10 NOTE — Assessment & Plan Note (Signed)

## 2015-07-10 NOTE — Assessment & Plan Note (Signed)

## 2015-07-11 LAB — CYTOLOGY - PAP

## 2015-07-15 ENCOUNTER — Other Ambulatory Visit: Payer: Self-pay | Admitting: Family Medicine

## 2015-08-04 ENCOUNTER — Telehealth: Payer: Self-pay

## 2015-08-04 NOTE — Telephone Encounter (Signed)
Head congestion x 1 week and chest congestion. No fever and advised sudafed and robitussin dm and to call back Monday if still sick and msg would be sent to the dr

## 2015-08-11 ENCOUNTER — Telehealth: Payer: Self-pay

## 2015-08-11 MED ORDER — GABAPENTIN 300 MG PO CAPS: ORAL_CAPSULE | ORAL | Status: DC

## 2015-08-11 NOTE — Telephone Encounter (Signed)
C/o night sweats x 1 month. Wakes up drenched in sweat at night but is fine during the day. Wants to know what she can take for it?  [phone]

## 2015-08-11 NOTE — Telephone Encounter (Signed)
Patient aware and changed on med list

## 2015-08-11 NOTE — Addendum Note (Signed)
Addended by: Eual Fines on: 08/11/2015 04:48 PM   Modules accepted: Orders

## 2015-08-11 NOTE — Telephone Encounter (Signed)
Advise incrase gabapentin to 2 at night , dress cooly and cold fluids,keep  fan on

## 2015-08-28 ENCOUNTER — Other Ambulatory Visit: Payer: Self-pay | Admitting: Family Medicine

## 2015-10-24 ENCOUNTER — Other Ambulatory Visit: Payer: Self-pay

## 2015-10-24 MED ORDER — VITAMIN D (ERGOCALCIFEROL) 1.25 MG (50000 UNIT) PO CAPS: ORAL_CAPSULE | ORAL | Status: DC

## 2015-11-03 ENCOUNTER — Telehealth: Payer: Self-pay

## 2015-11-03 NOTE — Telephone Encounter (Signed)
States she went out of town and was around dogs and thinks she had an allergy to them because she feels bad, scratchy throat. Advised claritin and to call back Wednesday if no better

## 2015-11-24 ENCOUNTER — Telehealth: Payer: Self-pay | Admitting: Family Medicine

## 2015-11-24 ENCOUNTER — Other Ambulatory Visit: Payer: Self-pay

## 2015-11-24 DIAGNOSIS — R42 Dizziness and giddiness: Secondary | ICD-10-CM

## 2015-11-24 MED ORDER — MECLIZINE HCL 25 MG PO TABS: 25.0000 mg | ORAL_TABLET | Freq: Three times a day (TID) | ORAL | 0 refills | Status: DC | PRN

## 2015-11-24 NOTE — Telephone Encounter (Signed)
Patient aware.   Given appt.  Medication sent to pharmacy.

## 2015-11-24 NOTE — Telephone Encounter (Signed)
Rhayne called asking to be seen today, I explained to her that Dr. Moshe Cipro was only here today this morning and the first appointment available would be Wednesday Morning. She is asking if she was supposed to stay out of work that long.Marland KitchenMarland KitchenPlease advise?

## 2015-11-24 NOTE — Telephone Encounter (Signed)
pls send antivert 25 mg 3 times daily as needed for 30 tabs only, can work in this week re shoulder pain, anTues, wed / thursd

## 2015-11-24 NOTE — Telephone Encounter (Signed)
Patient states that she has dizziness that started today.  Denies any sinus problems.   Also c/o left shoulder pain x 3 weeks.  Has been taking NSAIDs for pain.  Please advise.   States that pain increases when she tries to raise arm.

## 2015-11-26 ENCOUNTER — Ambulatory Visit (HOSPITAL_COMMUNITY)
Admission: RE | Admit: 2015-11-26 | Discharge: 2015-11-26 | Disposition: A | Discharge status: Home / Self Care | Payer: BLUE CROSS/BLUE SHIELD | Source: Ambulatory Visit | Attending: Family Medicine | Admitting: Family Medicine

## 2015-11-26 ENCOUNTER — Ambulatory Visit (INDEPENDENT_AMBULATORY_CARE_PROVIDER_SITE_OTHER): Payer: BLUE CROSS/BLUE SHIELD | Admitting: Family Medicine

## 2015-11-26 ENCOUNTER — Encounter: Payer: Self-pay | Admitting: Family Medicine

## 2015-11-26 DIAGNOSIS — M25512 Pain in left shoulder: Secondary | ICD-10-CM

## 2015-11-26 DIAGNOSIS — I10 Essential (primary) hypertension: Secondary | ICD-10-CM

## 2015-11-26 DIAGNOSIS — E038 Other specified hypothyroidism: Secondary | ICD-10-CM | POA: Diagnosis not present

## 2015-11-26 DIAGNOSIS — F17200 Nicotine dependence, unspecified, uncomplicated: Secondary | ICD-10-CM | POA: Diagnosis not present

## 2015-11-26 DIAGNOSIS — E669 Obesity, unspecified: Secondary | ICD-10-CM

## 2015-11-26 MED ORDER — PREDNISONE 5 MG (21) PO TBPK: 5.0000 mg | ORAL_TABLET | ORAL | 0 refills | Status: DC

## 2015-11-26 MED ORDER — METHOCARBAMOL 500 MG PO TABS: ORAL_TABLET | ORAL | 0 refills | Status: DC

## 2015-11-26 MED ORDER — IBUPROFEN 800 MG PO TABS: 800.0000 mg | ORAL_TABLET | Freq: Three times a day (TID) | ORAL | 0 refills | Status: DC

## 2015-11-26 NOTE — Patient Instructions (Addendum)
F/u as before  Three medications sent for left shoulder pain and reduced mobility  You are referred to orthopedics for further manageement, work excuse to last till ortho appt  X ray of shoulder today  STAY off of cigarettes, eat more cabbage!!!1

## 2015-11-26 NOTE — Progress Notes (Signed)
SONIQUE HARLAN     MRN: [id number]      DOB: 29-Sep-1950   HPI Ms. Maulden is here c/o acute vertigo starting 3 days ago. States following k after being in [place] for 1 week she returned on 7/22, and had increased allergy symptoms, has used antivert  With good result Left shoulder pain and reduced mobility x 3 weeks , no recent  trauma ROS Denies recent fever or chills. Denies sinus pressure, nasal congestion, ear pain or sore throat. Denies chest congestion, productive cough or wheezing. Denies chest pains, palpitations and leg swelling Denies abdominal pain, nausea, vomiting,diarrhea or constipation.   Denies dysuria, frequency, hesitancy or incontinence.  Denies headaches, seizures, numbness, or tingling. Denies depression, anxiety or insomnia. Denies skin break down or rash.   PE  BP 124/80   Pulse 69   Resp 16   Ht 5\' 2"  (1.575 m)   Wt 194 lb (88 kg)   SpO2 98%   BMI 35.48 kg/m   Patient alert and oriented and in no cardiopulmonary distress.  HEENT: No facial asymmetry, EOMI,   oropharynx pink and moist.  Neck supple no JVD, no mass.  Chest: Clear to auscultation bilaterally.  CVS: S1, S2 no murmurs, no S3.Regular rate.  ABD: Soft non tender.   Ext: No edema  MS: Adequate ROM spine, hips and knees. Decreased rOM left shoulder due to pain  Skin: Intact, no ulcerations or rash noted.  Psych: Good eye contact, normal affect. Memory intact not anxious or depressed appearing.  CNS: CN 2-12 intact, power,  normal throughout.no focal deficits noted.   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 The options available for help with cessation ,the behavioral changes to assist the process, and the option to either gradully reduce usage  Or abruptly stop.is also discussed. Pt is also encouraged to set specific goals in number of cigarettes used daily, as well as to set a quit date.     Obesity Deteriorated. Patient re-educated about  the importance of commitment to a  minimum of 150 minutes of exercise per week.  The importance of healthy food choices with portion control discussed. Encouraged to start a food diary, count calories and to consider  joining a support group. Sample diet sheets offered. Goals set by the patient for the next several months.   Weight /BMI 11/26/2015 07/09/2015 07/02/2015  WEIGHT 194 lb 183 lb 179 lb  HEIGHT 5\' 2"  5\' 2"  5' 1.5"  BMI 35.48 kg/m2 33.46 kg/m2 33.28 kg/m2      Hypothyroidism /Updated lab needed at/ before next visit.   HTN (hypertension) Controlled, no change in medication DASH diet and commitment to daily physical activity for a minimum of 30 minutes discussed and encouraged, as a part of hypertension management. The importance of attaining a healthy weight is also discussed.  BP/Weight 11/26/2015 07/09/2015 07/02/2015 09/30/2014 02/07/2014 11/28/2013 [id number]  Systolic BP [id number] 0000000 [id number]  Diastolic BP 80 82 78 80 82 82 72  Wt. (Lbs) 194 183 179 172.04 177  176 181  BMI 35.48 33.46 33.28 31.98 32.91 32.72 33.65

## 2015-11-29 ENCOUNTER — Encounter: Payer: Self-pay | Admitting: Family Medicine

## 2015-11-29 NOTE — Assessment & Plan Note (Signed)
Controlled, no change in medication DASH diet and commitment to daily physical activity for a minimum of 30 minutes discussed and encouraged, as a part of hypertension management. The importance of attaining a healthy weight is also discussed.  BP/Weight 11/26/2015 07/09/2015 07/02/2015 09/30/2014 02/07/2014 11/28/2013 [id number]  Systolic BP A999333 [id number] 0000000 [id number]  Diastolic BP 80 82 78 80 82 82 72  Wt. (Lbs) 194 183 179 172.04 177 176 181  BMI 35.48 33.46 33.28 31.98 32.91 32.72 33.65

## 2015-11-29 NOTE — Assessment & Plan Note (Signed)
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

## 2015-11-29 NOTE — Assessment & Plan Note (Signed)

## 2015-11-29 NOTE — Assessment & Plan Note (Signed)
Updated lab needed at/ before next visit.   

## 2015-11-29 NOTE — Assessment & Plan Note (Signed)
Deteriorated. Patient re-educated about  the importance of commitment to a  minimum of 150 minutes of exercise per week.  The importance of healthy food choices with portion control discussed. Encouraged to start a food diary, count calories and to consider  joining a support group. Sample diet sheets offered. Goals set by the patient for the next several months.   Weight /BMI 11/26/2015 07/09/2015 07/02/2015  WEIGHT 194 lb 183 lb 179 lb  HEIGHT 5\' 2"  5\' 2"  5' 1.5"  BMI 35.48 kg/m2 33.46 kg/m2 33.28 kg/m2

## 2015-12-02 ENCOUNTER — Encounter: Payer: Self-pay | Admitting: Orthopaedic Surgery

## 2015-12-02 ENCOUNTER — Ambulatory Visit (INDEPENDENT_AMBULATORY_CARE_PROVIDER_SITE_OTHER): Payer: BLUE CROSS/BLUE SHIELD | Admitting: Orthopaedic Surgery

## 2015-12-02 VITALS — BP 134/82 | HR 72 | Ht 62.0 in | Wt 197.4 lb

## 2015-12-02 DIAGNOSIS — M25512 Pain in left shoulder: Secondary | ICD-10-CM | POA: Diagnosis not present

## 2015-12-02 NOTE — Patient Instructions (Signed)
Begin therapy.

## 2015-12-02 NOTE — Progress Notes (Signed)
Subjective: My left shoulder hurts    Patient ID: Victoria Reyes, female    DOB: 11/02/1950, 65 y.o.   MRN: [id number]  HPI She has several week history of left shoulder pain. She has no trauma.  She has pain with overhead use.  She has no redness or swelling.  She has seen Dr. Moshe Cipro and is on ibuprofen and Robaxin.  They help some. She has also tried ice, heat and rest.  She has no numbness.  She has pain when rolling over it at night.   Review of Systems  HENT: Negative for congestion.   Respiratory: Negative for cough and shortness of breath.   Cardiovascular: Negative for chest pain and leg swelling.  Endocrine: Positive for cold intolerance.  Musculoskeletal: Positive for arthralgias.   Past Medical History:  Diagnosis Date  . Diabetes mellitus    b[place]line  . Diverticulitis 2005   CT   . DJD (degenerative joint disease)   . GERD (gastroesophageal reflux disease)   . Headache(784.0)   . Hiatal hernia   . Hyperlipidemia   . Hypothyroidism   . Knee pain, right   . Low back pain   . Neuropathy (HCC)    peroneal nerve  . Obesity   . S/P colonoscopy 2005   Dr. Tamala Julian: sigmoid diverticulosis  . Thyroid disease   . Tobacco abuse     Past Surgical History:  Procedure Laterality Date  . BACK SURGERY    . COLONOSCOPY  12/18/2010   Procedure: COLONOSCOPY;  Surgeon: Dorothyann Peng, MD;  Location: [facility];  Service: Endoscopy;  Laterality: N/A;  10:15am  . cyst removal right wrist    . TONSILLECTOMY    . TUBAL LIGATION    . umbillical hernia      Current Outpatient Prescriptions on File Prior to Visit  Medication Sig Dispense Refill  . amLODipine (NORVASC) 5 MG tablet TAKE 1 TABLET (5 MG TOTAL) BY MOUTH DAILY. 30 tablet 4  . gabapentin (NEURONTIN) 300 MG capsule TAKE 2 CAPSULES (300 MG TOTAL) BY MOUTH AT BEDTIME. 60 capsule 3  . ibuprofen (ADVIL,MOTRIN) 800 MG tablet Take 1 tablet (800 mg total) by mouth 3 (three) times daily. 30 tablet 0  . levothyroxine  (SYNTHROID, LEVOTHROID) 100 MCG tablet TAKE 1 TABLET BY MOUTH EVERY DAY BEFORE BREAKFAST, EXCEPT SATURDAY AND SUNDAY ONLY TAKE 1/2 TABLET 24 tablet 4  . meclizine (ANTIVERT) 25 MG tablet Take 1 tablet (25 mg total) by mouth 3 (three) times daily as needed for dizziness. 30 tablet 0  . methocarbamol (ROBAXIN) 500 MG tablet One tablet twice daily 20 tablet 0  . omeprazole (PRILOSEC) 40 MG capsule TAKE 1 CAPSULE BY MOUTH DAILY. 30 capsule 3  . ondansetron (ZOFRAN) 4 MG tablet Take 1 tablet (4 mg total) by mouth every 8 (eight) hours as needed for nausea or vomiting. 20 tablet 0  . pravastatin (PRAVACHOL) 40 MG tablet Take 1 tablet (40 mg total) by mouth daily. 30 tablet 5  . predniSONE (STERAPRED UNI-PAK 21 TAB) 5 MG (21) TBPK tablet Take 1 tablet (5 mg total) by mouth as directed. Use as directed 21 tablet 0  . Vitamin D, Ergocalciferol, (DRISDOL) 50000 units CAPS capsule TAKE ONE CAPSULE BY MOUTH EVERY 7 DAYS 12 capsule 1   No current facility-administered medications on file prior to visit.     Social History   Social History  . Marital status: Widowed    Spouse name: N/A  . Number of children:  N/A  . Years of education: N/A   Occupational History  . Not on file.   Social History Main Topics  . Smoking status: Former Smoker    Packs/day: 0.50    Years: 40.00    Types: Cigarettes    Start date: 09/30/1963  . Smokeless tobacco: Never Used     Comment: smokes 8 per day  . Alcohol use No  . Drug use: No  . Sexual activity: Not Currently   Other Topics Concern  . Not on file   Social History Narrative  . No narrative on file    Family History  Problem Relation Age of Onset  . Colon cancer Neg Hx     BP 134/82   Pulse 72   Ht 5\' 2"  (1.575 m)   Wt 197 lb 6.4 oz (89.5 kg)   BMI 36.10 kg/m      Objective:   Physical Exam  Constitutional: She is oriented to person, place, and time. She appears well-developed and well-nourished.  HENT:  Head: Normocephalic and  atraumatic.  Eyes: Conjunctivae and EOM are normal. Pupils are equal, round, and reactive to light.  Neck: Normal range of motion. Neck supple.  Cardiovascular: Normal rate, regular rhythm and intact distal pulses.   Pulmonary/Chest: Effort normal.  Abdominal: Soft.  Musculoskeletal: Tenderness: Left shoulder tender but has full motion.  NV intact.  Pain to resisted abduction.  Right shoulder and neck are negative.  Neurological: She is alert and oriented to person, place, and time. She displays normal reflexes. No cranial nerve deficit. She exhibits normal muscle tone. Coordination normal.  Skin: Skin is warm and dry.  Psychiatric: She has a normal mood and affect. Her behavior is normal. Judgment and thought content normal.  Vitals reviewed.   I have reviewed Dr. Griffin Dakin notes and x-rays and x-ray report for the left shoulder.      Assessment & Plan:   Encounter Diagnosis  Name Primary?  . Left shoulder pain Yes   PROCEDURE NOTE:  The patient request injection, verbal consent was obtained.  The left shoulder was prepped appropriately after time out was performed.   Sterile technique was observed and injection of 1 cc of Depo-Medrol 40 mg with several cc's of plain xylocaine. Anesthesia was provided by ethyl chloride and a 20-gauge needle was used to inject the shoulder area. A posterior approach was used.  The injection was tolerated well.  A band aid dressing was applied.  The patient was advised to apply ice later today and tomorrow to the injection sight as needed.  I will begin OT for her.  She is to continue her present medicine.  Return in two weeks.  Call if any problem.  Precautions discussed.  Electronically Signed Sanjuana Kava, MD 8/22/20173:47 PM

## 2015-12-08 ENCOUNTER — Other Ambulatory Visit: Payer: Self-pay | Admitting: Family Medicine

## 2015-12-09 ENCOUNTER — Ambulatory Visit (HOSPITAL_COMMUNITY): Payer: BLUE CROSS/BLUE SHIELD | Attending: Orthopaedic Surgery | Admitting: Occupational Therapy

## 2015-12-09 ENCOUNTER — Encounter (HOSPITAL_COMMUNITY): Payer: Self-pay | Admitting: Occupational Therapy

## 2015-12-09 DIAGNOSIS — M25512 Pain in left shoulder: Secondary | ICD-10-CM | POA: Insufficient documentation

## 2015-12-09 DIAGNOSIS — M25612 Stiffness of left shoulder, not elsewhere classified: Secondary | ICD-10-CM | POA: Insufficient documentation

## 2015-12-09 DIAGNOSIS — R29898 Other symptoms and signs involving the musculoskeletal system: Secondary | ICD-10-CM

## 2015-12-09 NOTE — Patient Instructions (Signed)
  1) Flexion Wall Stretch    Face wall, place affected handon wall in front of you. Slide hand up the wall  and lean body in towards the wall. Hold for 5 seconds. Repeat 10 times. 1-2 times/day.     2) Towel Stretch with Internal Rotation   Gently pull up your affected arm  behind your back with the assist of a towel. Repeat 10X, 1-2X/day.             3) Corner Stretch    Stand at a corner of a wall, place your arms on the walls with elbows bent. Lean into the corner until a stretch is felt along the front of your chest and/or shoulders. Hold for 5 seconds. Repeat 10X, 1-2 times/day.    4) Posterior Capsule Stretch    Bring the involved arm across chest. Grasp elbow and pull toward chest until you feel a stretch in the back of the upper arm and shoulder. Hold 5 seconds. Repeat 10X. Complete 1-2 times/day.    5) Scapular Retraction    Tuck chin back as you pinch shoulder blades together.  Hold 5 seconds. Repeat 10X. Complete 1-2 times/day.    6) External Rotation Stretch:     Place your affected hand on the wall with the elbow bent and gently turn your body the opposite direction until a stretch is felt.

## 2015-12-09 NOTE — Therapy (Signed)
[place], [place], [address] Phone: [phone]   Fax:  [phone]  Occupational Therapy Evaluation  Patient Details  Name: Victoria Reyes MRN: [id number] Date of Birth: 08/28/1950 Referring Provider: Dr. Sanjuana Kava  Encounter Date: 12/09/2015      OT End of Session - 12/09/15 1611    Visit Number 1   Number of Visits 6   Date for OT Re-Evaluation 02/07/16  mini reassessment 01/07/16   Authorization Type BCBS   Authorization Time Period 60 visit limit. $25 copay   Authorization - Visit Number 1   Authorization - Number of Visits 72   OT Start Time [id number]   OT Stop Time 1558   OT Time Calculation (min) 40 min   Activity Tolerance Patient tolerated treatment well   Behavior During Therapy [facility] for tasks assessed/performed      Past Medical History:  Diagnosis Date  . Diabetes mellitus    bordlerline  . Diverticulitis 2005   CT   . DJD (degenerative joint disease)   . GERD (gastroesophageal reflux disease)   . Headache(784.0)   . Hiatal hernia   . Hyperlipidemia   . Hypothyroidism   . Knee pain, right   . Low back pain   . Neuropathy (HCC)    peroneal nerve  . Obesity   . S/P colonoscopy 2005   Dr. Tamala Julian: sigmoid diverticulosis  . Thyroid disease   . Tobacco abuse     Past Surgical History:  Procedure Laterality Date  . BACK SURGERY    . COLONOSCOPY  12/18/2010   Procedure: COLONOSCOPY;  Surgeon: Dorothyann Peng, MD;  [facility];  Service: Endoscopy;  Laterality: N/A;  10:15am  . cyst removal right wrist    . TONSILLECTOMY    . TUBAL LIGATION    . umbillical hernia      There were no vitals filed for this visit.      Subjective Assessment - 12/09/15 1602    Subjective  S: The doctor says it's arthritis.    Pertinent History Pt is a 65 y/o female presenting with left shoulder pain that began approximately 1 month ago. Pt received a cortisone shot one week ago but reports  it did not help. Dr. Sanjuana Kava has referred pt to occupational therapy for evaluation and treatment.    Special Tests FOTO Score: 51/100 (49% impairment)   Patient Stated Goals To have less pain.    Currently in Pain? Yes   Pain Score 5    Pain Location Shoulder   Pain Orientation Left   Pain Descriptors / Indicators Aching   Pain Type Acute pain   Pain Radiating Towards n/a   Pain Onset 1 to 4 weeks ago   Pain Frequency Constant   Aggravating Factors  movement   Pain Relieving Factors heat   Effect of Pain on Daily Activities limited ability to use LUE during daily tasks   Multiple Pain Sites No           [facility] OT Assessment - 12/09/15 1518      Assessment   Diagnosis Left shoulder pain   Referring Provider Dr. Sanjuana Kava   Onset Date 11/08/15   Prior Therapy none     Precautions   Precautions None     Restrictions   Weight Bearing Restrictions No     Balance Screen   Has the patient fallen in the past 6 months No   Has  the patient had a decrease in activity level because of a fear of falling?  No   Is the patient reluctant to leave their home because of a fear of falling?  No     Home  Environment   Family/patient expects to be discharged to: Private residence     Prior Function   Level of Independence Independent   Vocation Full time employment   Vocation Requirements drives fork lift    Leisure uses computer-plays games, granddaughter     ADL   ADL comments Pt is able to complete ADL tasks however experiences pain with all movements. Reaching behind back into extension and ER is very difficult.      Written Expression   Dominant Hand Right     Cognition   Overall Cognitive Status Within Functional Limits for tasks assessed     ROM / Strength   AROM / PROM / Strength AROM;PROM;Strength     Palpation   Palpation comment Pt has max fascial restrictions in left anterior deltoid, upper arm, and trapezius regions     AROM   Overall AROM Comments  Assessed seated, ER/IR adducted   AROM Assessment Site Shoulder   Right/Left Shoulder Left   Left Shoulder Flexion 129 Degrees   Left Shoulder ABduction 90 Degrees   Left Shoulder Internal Rotation 90 Degrees   Left Shoulder External Rotation 48 Degrees     PROM   Overall PROM Comments Assessed in supine, ER/IR adducted   PROM Assessment Site Shoulder   Right/Left Shoulder Left   Left Shoulder Flexion 169 Degrees   Left Shoulder ABduction 137 Degrees   Left Shoulder Internal Rotation 90 Degrees   Left Shoulder External Rotation 61 Degrees     Strength   Overall Strength Comments Assessed seated, ER/IR adducted   Strength Assessment Site Shoulder   Right/Left Shoulder Left   Left Shoulder Flexion 4/5   Left Shoulder ABduction 4-/5   Left Shoulder Internal Rotation 4/5   Left Shoulder External Rotation 4/5                         OT Education - 12/09/15 1550    Education provided Yes   Education Details shoulder stretches   Person(s) Educated Patient   Methods Explanation;Demonstration;Handout   Comprehension Verbalized understanding;Returned demonstration          OT Short Term Goals - 12/09/15 1616      OT SHORT TERM GOAL #1   Title Pt will be provided with and educated on HEP.    Time 6   Period Weeks   Status New     OT SHORT TERM GOAL #2   Title Pt will decrease LUE pain to 3/10 or less to improve ability to use LUE during work tasks.    Time 6   Period Weeks   Status New     OT SHORT TERM GOAL #3   Title Pt will decrease fascial restrictions in LUE from max to moderate amounts or less to improve mobility in LUE during daily tasks.    Time 6   Period Weeks   Status New     OT SHORT TERM GOAL #4   Title Pt will improve A/ROM of LUE to [facility] to increase ability to complete overhead tasks with minimal pain.    Time 6   Period Weeks   Status New     OT SHORT TERM GOAL #5   Title Pt will increase  LUE strength to 4+/5 to improve ability to  complete lightweight lifting tasks at home and work.    Time 6   Period Weeks   Status New                  Plan - 12/09/15 1612    Clinical Impression Statement A: Pt is a 65 y/o female presenting with increased pain and fascial restrictions, decreased range of motion and strength in LUE limiting ability to complete daily tasks. Pt reports MD suspects arthritis, pt willing to try therapy 1x/week and complete HEP.    Rehab Potential Good   OT Frequency 1x / week   OT Duration 6 weeks   OT Treatment/Interventions Self-care/ADL training;Therapeutic exercise;Patient/family education;Manual Therapy;Cryotherapy;DME and/or AE instruction;Therapeutic activities;Electrical Stimulation;Moist Heat;Passive range of motion   Plan P: Pt will benefit from skilled OT services to improve functional use of LUE during daily and work tasks. Treatment plan: myofascial release, manual therapy, P/ROM, AA/ROM, A/ROM, general UE strengthening, scapular stability and strengthening, modalities prn   OT Home Exercise Plan shoulder stretches   Consulted and Agree with Plan of Care Patient      Patient will benefit from skilled therapeutic intervention in order to improve the following deficits and impairments:  Decreased strength, Decreased range of motion, Impaired UE functional use, Pain, Increased fascial restricitons  Visit Diagnosis: Pain in left shoulder  Stiffness of left shoulder, not elsewhere classified  Other symptoms and signs involving the musculoskeletal system    Problem List Patient Active Problem List   Diagnosis Date Noted  . Shoulder pain, left 11/26/2015  . Cutaneous wart 09/30/2014  . HTN (hypertension) 01/01/2013  . Hyperlipidemia with target LDL less than 100 05/04/2012  . IGT (impaired glucose tolerance) 03/14/2011  . GERD (gastroesophageal reflux disease) 03/14/2011  . BACK PAIN, LUMBAR, CHRONIC 06/25/2009  . TOBACCO USER 12/13/2007  . Obesity 02/17/2007  .  Hypothyroidism 06/14/2006  . DIVERTICULOSIS, COLON 03/01/2006  . HIATAL HERNIA WITH REFLUX, HX OF 03/01/2006   Guadelupe Sabin, OTR/L  [phone] 12/09/2015, 4:19 PM  [facility] [place], [place], [address] Phone: [phone]   Fax:  [phone]  Name: Victoria Reyes MRN: [id number] Date of Birth: 1951/03/14

## 2015-12-11 ENCOUNTER — Telehealth (HOSPITAL_COMMUNITY): Payer: Self-pay

## 2015-12-11 NOTE — Telephone Encounter (Signed)
8/31 left a message to see about scheduling appt next week - there are openings.

## 2015-12-16 ENCOUNTER — Ambulatory Visit (INDEPENDENT_AMBULATORY_CARE_PROVIDER_SITE_OTHER): Payer: BLUE CROSS/BLUE SHIELD | Admitting: Orthopaedic Surgery

## 2015-12-16 ENCOUNTER — Encounter: Payer: Self-pay | Admitting: Orthopaedic Surgery

## 2015-12-16 VITALS — BP 119/71 | HR 79 | Temp 97.3°F | Ht 62.5 in | Wt 192.0 lb

## 2015-12-16 DIAGNOSIS — M25512 Pain in left shoulder: Secondary | ICD-10-CM | POA: Diagnosis not present

## 2015-12-16 NOTE — Progress Notes (Signed)
Patient [id number] Victoria Reyes, female DOB:15-Jun-1950, 65 y.o. [id number]  Chief Complaint  Patient presents with  . Follow-up    LEFT SHOULDER PAIN    HPI  Victoria Reyes is a 65 y.o. female who has left shoulder pain.  She has just started OT so it is not long enough to see if it has helped.  She has no swelling or paresthesias or new trauma.  She is taking her medicine. HPI  Body mass index is 34.56 kg/m.  ROS  Review of Systems  HENT: Negative for congestion.   Respiratory: Negative for cough and shortness of breath.   Cardiovascular: Negative for chest pain and leg swelling.  Endocrine: Positive for cold intolerance.  Musculoskeletal: Positive for arthralgias.    Past Medical History:  Diagnosis Date  . Diabetes mellitus    bordlerline  . Diverticulitis 2005   CT   . DJD (degenerative joint disease)   . GERD (gastroesophageal reflux disease)   . Headache(784.0)   . Hiatal hernia   . Hyperlipidemia   . Hypothyroidism   . Knee pain, right   . Low back pain   . Neuropathy (HCC)    peroneal nerve  . Obesity   . S/P colonoscopy 2005   Dr. Tamala Julian: sigmoid diverticulosis  . Thyroid disease   . Tobacco abuse     Past Surgical History:  Procedure Laterality Date  . BACK SURGERY    . COLONOSCOPY  12/18/2010   Procedure: COLONOSCOPY;  Surgeon: Dorothyann Peng, MD;  [facility];  Service: Endoscopy;  Laterality: N/A;  10:15am  . cyst removal right wrist    . TONSILLECTOMY    . TUBAL LIGATION    . umbillical hernia      Family History  Problem Relation Age of Onset  . Colon cancer Neg Hx     Social History Social History  Substance Use Topics  . Smoking status: Former Smoker    Packs/day: 0.50    Years: 40.00    Types: Cigarettes    Start date: 09/30/1963  . Smokeless tobacco: Never Used     Comment: smokes 8 per day  . Alcohol use No    Allergies  Allergen Reactions  . Hydrocodone     REACTION: Nausea  . Sulfa Antibiotics Itching     Current Outpatient Prescriptions  Medication Sig Dispense Refill  . amLODipine (NORVASC) 5 MG tablet TAKE 1 TABLET (5 MG TOTAL) BY MOUTH DAILY. 30 tablet 4  . gabapentin (NEURONTIN) 300 MG capsule TAKE 2 CAPSULES (300 MG TOTAL) BY MOUTH AT BEDTIME. 60 capsule 3  . ibuprofen (ADVIL,MOTRIN) 800 MG tablet Take 1 tablet (800 mg total) by mouth 3 (three) times daily. 30 tablet 0  . levothyroxine (SYNTHROID, LEVOTHROID) 100 MCG tablet TAKE 1 TABLET BY MOUTH EVERY DAY BEFORE BREAKFAST, EXCEPT SATURDAY AND SUNDAY ONLY TAKE 1/2 TABLET 24 tablet 4  . meclizine (ANTIVERT) 25 MG tablet Take 1 tablet (25 mg total) by mouth 3 (three) times daily as needed for dizziness. 30 tablet 0  . methocarbamol (ROBAXIN) 500 MG tablet One tablet twice daily 20 tablet 0  . omeprazole (PRILOSEC) 40 MG capsule TAKE 1 CAPSULE BY MOUTH DAILY. 30 capsule 3  . ondansetron (ZOFRAN) 4 MG tablet Take 1 tablet (4 mg total) by mouth every 8 (eight) hours as needed for nausea or vomiting. 20 tablet 0  . pravastatin (PRAVACHOL) 40 MG tablet Take 1 tablet (40 mg total) by mouth daily. 30 tablet 5  .  predniSONE (STERAPRED UNI-PAK 21 TAB) 5 MG (21) TBPK tablet Take 1 tablet (5 mg total) by mouth as directed. Use as directed 21 tablet 0  . Vitamin D, Ergocalciferol, (DRISDOL) 50000 units CAPS capsule TAKE ONE CAPSULE BY MOUTH EVERY 7 DAYS 12 capsule 1   No current facility-administered medications for this visit.      Physical Exam  Blood pressure 119/71, pulse 79, temperature 97.3 F (36.3 C), height 5' 2.5" (1.588 m), weight 192 lb (87.1 kg).  Constitutional: overall normal hygiene, normal nutrition, well developed, normal grooming, normal body habitus. Assistive device:none  Musculoskeletal: gait and station Limp none, muscle tone and strength are normal, no tremors or atrophy is present.  .  Neurological: coordination overall normal.  Deep tendon reflex/nerve stretch intact.  Sensation normal.  Cranial nerves II-XII  intact.   Skin:   normal overall no scars, lesions, ulcers or rashes. No psoriasis.  Psychiatric: Alert and oriented x 3.  Recent memory intact, remote memory unclear.  Normal mood and affect. Well groomed.  Good eye contact.  Cardiovascular: overall no swelling, no varicosities, no edema bilaterally, normal temperatures of the legs and arms, no clubbing, cyanosis and good capillary refill.  Lymphatic: palpation is normal.  Examination of left Upper Extremity is done.  Inspection:   Overall:  Elbow non-tender without crepitus or defects, forearm non-tender without crepitus or defects, wrist non-tender without crepitus or defects, hand non-tender.    Shoulder: with glenohumeral joint tenderness, without effusion.   Upper arm: without swelling and tenderness   Range of motion:   Overall:  Full range of motion of the elbow, full range of motion of wrist and full range of motion in fingers.   Shoulder:  left  180 degrees forward flexion; 165 degrees abduction; 35 degrees internal rotation, 35 degrees external rotation, 20 degrees extension, 40 degrees adduction.   Stability:   Overall:  Shoulder, elbow and wrist stable   Strength and Tone:   Overall full shoulder muscles strength, full upper arm strength and normal upper arm bulk and tone.   The patient has been educated about the nature of the problem(s) and counseled on treatment options.  The patient appeared to understand what I have discussed and is in agreement with it.  Encounter Diagnosis  Name Primary?  . Left shoulder pain Yes    PLAN Call if any problems.  Precautions discussed.  Continue current medications.   Return to clinic 3 weeks   Continue OT.  Electronically Signed Sanjuana Kava, MD 9/5/20173:28 PM

## 2015-12-17 ENCOUNTER — Other Ambulatory Visit: Payer: Self-pay | Admitting: Family Medicine

## 2015-12-17 DIAGNOSIS — IMO0002 Reserved for concepts with insufficient information to code with codable children: Secondary | ICD-10-CM

## 2015-12-17 DIAGNOSIS — R229 Localized swelling, mass and lump, unspecified: Principal | ICD-10-CM

## 2015-12-22 ENCOUNTER — Ambulatory Visit (HOSPITAL_COMMUNITY): Payer: BLUE CROSS/BLUE SHIELD | Attending: Orthopaedic Surgery

## 2015-12-22 ENCOUNTER — Telehealth (HOSPITAL_COMMUNITY): Payer: Self-pay

## 2015-12-22 DIAGNOSIS — M25512 Pain in left shoulder: Secondary | ICD-10-CM | POA: Insufficient documentation

## 2015-12-22 DIAGNOSIS — R29898 Other symptoms and signs involving the musculoskeletal system: Secondary | ICD-10-CM | POA: Insufficient documentation

## 2015-12-22 DIAGNOSIS — M25612 Stiffness of left shoulder, not elsewhere classified: Secondary | ICD-10-CM | POA: Insufficient documentation

## 2015-12-22 NOTE — Telephone Encounter (Signed)
Spoke with patient regarding no show. Patient mixed up appointment days. Patient rescheduled for this Wednesday 9/13.  Ailene Ravel, OTR/L,CBIS  [phone]

## 2015-12-24 ENCOUNTER — Encounter (HOSPITAL_COMMUNITY): Payer: Self-pay

## 2015-12-24 ENCOUNTER — Ambulatory Visit (HOSPITAL_COMMUNITY): Payer: BLUE CROSS/BLUE SHIELD

## 2015-12-24 DIAGNOSIS — R29898 Other symptoms and signs involving the musculoskeletal system: Secondary | ICD-10-CM

## 2015-12-24 DIAGNOSIS — M25512 Pain in left shoulder: Secondary | ICD-10-CM

## 2015-12-24 DIAGNOSIS — M25612 Stiffness of left shoulder, not elsewhere classified: Secondary | ICD-10-CM

## 2015-12-24 NOTE — Therapy (Signed)
[place], [place], [address] Phone: [phone]   Fax:  [phone]  Occupational Therapy Treatment  Patient Details  Name: Victoria Reyes MRN: [id number] Date of Birth: 1950/07/08 Referring Provider: Dr. Sanjuana Kava  Encounter Date: 12/24/2015      OT End of Session - 12/24/15 1738    Visit Number 2   Number of Visits 6   Date for OT Re-Evaluation 02/07/16  mini reassessment 01/07/16   Authorization Type [employer]   Authorization Time Period 60 visit limit. $25 copay   Authorization - Visit Number 2   Authorization - Number of Visits 46   OT Start Time [id number]   OT Stop Time 1730   OT Time Calculation (min) 40 min   Activity Tolerance Patient tolerated treatment well   Behavior During Therapy [facility] for tasks assessed/performed      Past Medical History:  Diagnosis Date  . Diabetes mellitus    bordlerline  . Diverticulitis 2005   CT   . DJD (degenerative joint disease)   . GERD (gastroesophageal reflux disease)   . Headache(784.0)   . Hiatal hernia   . Hyperlipidemia   . Hypothyroidism   . Knee pain, right   . Low back pain   . Neuropathy (HCC)    peroneal nerve  . Obesity   . S/P colonoscopy 2005   Dr. Tamala Julian: sigmoid diverticulosis  . Thyroid disease   . Tobacco abuse     Past Surgical History:  Procedure Laterality Date  . BACK SURGERY    . COLONOSCOPY  12/18/2010   Procedure: COLONOSCOPY;  Surgeon: Dorothyann Peng, MD;  [facility];  Service: Endoscopy;  Laterality: N/A;  10:15am  . cyst removal right wrist    . TONSILLECTOMY    . TUBAL LIGATION    . umbillical hernia      There were no vitals filed for this visit.      Subjective Assessment - 12/24/15 1719    Subjective  S: What is therapy going to do for me?   Currently in Pain? Yes   Pain Score 7    Pain Location Shoulder   Pain Orientation Left   Pain Descriptors / Indicators Aching   Pain Type Acute pain   Pain  Radiating Towards Shoulder down to elbow   Pain Onset 1 to 4 weeks ago   Pain Frequency Constant   Aggravating Factors  movement   Pain Relieving Factors heat   Effect of Pain on Daily Activities Pt reports that she continues to do what she needs to do. She push through the pain.   Multiple Pain Sites No            [facility] OT Assessment - 12/24/15 1721      Assessment   Diagnosis Left shoulder pain     Precautions   Precautions None                  OT Treatments/Exercises (OP) - 12/24/15 1721      Exercises   Exercises Shoulder     Shoulder Exercises: Supine   Protraction PROM;AAROM;10 reps   Horizontal ABduction PROM;AAROM;10 reps   External Rotation PROM;AAROM;10 reps   Internal Rotation PROM;AAROM;10 reps   Flexion PROM;AAROM;10 reps   ABduction PROM;AAROM;10 reps     Manual Therapy   Manual Therapy Myofascial release   Manual therapy comments Manual therapy completed prior to exercises.   Myofascial Release Myofascial release  and manual stretching completed to Left upper arm, trapezius, and scapularis region.                OT Education - 12/24/15 1751    Education provided Yes   Education Details Patient was given print out of OT evaluation. Reviewed goals and POC. Educated on benefit of therapy and reasons to try therapy first versus surgery.   Person(s) Educated Patient   Methods Explanation;Demonstration;Handout   Comprehension Returned demonstration;Verbalized understanding          OT Short Term Goals - 12/24/15 1727      OT SHORT TERM GOAL #1   Title Pt will be provided with and educated on HEP.    Time 6   Period Weeks   Status On-going     OT SHORT TERM GOAL #2   Title Pt will decrease LUE pain to 3/10 or less to improve ability to use LUE during work tasks.    Time 6   Period Weeks   Status On-going     OT SHORT TERM GOAL #3   Title Pt will decrease fascial restrictions in LUE from max to moderate amounts or less to  improve mobility in LUE during daily tasks.    Time 6   Period Weeks   Status On-going     OT SHORT TERM GOAL #4   Title Pt will improve A/ROM of LUE to [facility] to increase ability to complete overhead tasks with minimal pain.    Time 6   Period Weeks   Status On-going     OT SHORT TERM GOAL #5   Title Pt will increase LUE strength to 4+/5 to improve ability to complete lightweight lifting tasks at home and work.    Time 6   Period Weeks   Status On-going                  Plan - 12/24/15 1745    Clinical Impression Statement A: Initiated myofascial release, manual therapy, passive stretching and AA/ROM supine. VC for form and technique. patient was educated on benefit of therapy and the reason to try therapy first before having surgery or anything else invasive.    Plan P: Attempt AA/ROM standing. Upgrade HEP.      Patient will benefit from skilled therapeutic intervention in order to improve the following deficits and impairments:  Decreased strength, Decreased range of motion, Impaired UE functional use, Pain, Increased fascial restricitons  Visit Diagnosis: Pain in left shoulder  Stiffness of left shoulder, not elsewhere classified  Other symptoms and signs involving the musculoskeletal system    Problem List Patient Active Problem List   Diagnosis Date Noted  . Shoulder pain, left 11/26/2015  . Cutaneous wart 09/30/2014  . HTN (hypertension) 01/01/2013  . Hyperlipidemia with target LDL less than 100 05/04/2012  . IGT (impaired glucose tolerance) 03/14/2011  . GERD (gastroesophageal reflux disease) 03/14/2011  . BACK PAIN, LUMBAR, CHRONIC 06/25/2009  . TOBACCO USER 12/13/2007  . Obesity 02/17/2007  . Hypothyroidism 06/14/2006  . DIVERTICULOSIS, COLON 03/01/2006  . HIATAL HERNIA WITH REFLUX, HX OF 03/01/2006   Ailene Ravel, OTR/L,CBIS  [phone]  12/24/2015, 5:52 PM  [place], [place], [address] Phone: [phone]   Fax:  [phone]  Name: ZETHA HSIAO MRN: [id number] Date of Birth: 07-19-1950

## 2015-12-25 ENCOUNTER — Telehealth: Payer: Self-pay | Admitting: Family Medicine

## 2015-12-25 NOTE — Telephone Encounter (Signed)
Called to reschedule - Dr. Simpson out of town °

## 2015-12-26 LAB — COMPLETE METABOLIC PANEL WITH GFR
ALT: 9 U/L (ref 6–29)
AST: 14 U/L (ref 10–35)
Albumin: 3.8 g/dL (ref 3.6–5.1)
Alkaline Phosphatase: 97 U/L (ref 33–130)
BUN: 12 mg/dL (ref 7–25)
CHLORIDE: 108 mmol/L (ref 98–110)
CO2: 25 mmol/L (ref 20–31)
Calcium: 8.8 mg/dL (ref 8.6–10.4)
Creat: 0.74 mg/dL (ref 0.50–0.99)
GFR, Est African American: >89 mL/min (ref >=60)
GFR, Est Non African American: 85 mL/min (ref >=60)
GLUCOSE: 85 mg/dL (ref 65–99)
POTASSIUM: 4.2 mmol/L (ref 3.5–5.3)
SODIUM: 140 mmol/L (ref 135–146)
Total Bilirubin: 0.5 mg/dL (ref 0.2–1.2)
Total Protein: 6.3 g/dL (ref 6.1–8.1)

## 2015-12-26 LAB — LIPID PANEL
CHOL/HDL RATIO: 2.2 ratio (ref <=5.0)
Cholesterol: 164 mg/dL (ref 125–200)
HDL: 74 mg/dL (ref >=46)
LDL CALC: 70 mg/dL (ref <130)
Triglycerides: 101 mg/dL (ref <150)
VLDL: 20 mg/dL (ref <30)

## 2015-12-26 LAB — TSH: TSH: 1.53 m[IU]/L

## 2015-12-26 LAB — VITAMIN D 25 HYDROXY (VIT D DEFICIENCY, FRACTURES): VIT D 25 HYDROXY: 37 ng/mL (ref 30–100)

## 2015-12-27 LAB — HEMOGLOBIN A1C
Hgb A1c MFr Bld: 5.6 % (ref <5.7)
Mean Plasma Glucose: 114 mg/dL

## 2015-12-30 ENCOUNTER — Ambulatory Visit (HOSPITAL_COMMUNITY): Payer: BLUE CROSS/BLUE SHIELD

## 2015-12-30 DIAGNOSIS — R29898 Other symptoms and signs involving the musculoskeletal system: Secondary | ICD-10-CM

## 2015-12-30 DIAGNOSIS — M25612 Stiffness of left shoulder, not elsewhere classified: Secondary | ICD-10-CM

## 2015-12-30 DIAGNOSIS — M25512 Pain in left shoulder: Secondary | ICD-10-CM | POA: Diagnosis not present

## 2015-12-30 NOTE — Therapy (Signed)
[place], [place], [address] Phone: [phone]   Fax:  [phone]  Occupational Therapy Treatment  Patient Details  Name: Victoria Reyes MRN: [id number] Date of Birth: Aug 03, 1950 Referring Provider: Dr. Sanjuana Kava  Encounter Date: 12/30/2015      OT End of Session - 12/30/15 1641    Visit Number 3   Number of Visits 6   Date for OT Re-Evaluation 02/07/16  mini reassessment 01/07/16   Authorization Type BCBS   Authorization Time Period 60 visit limit. $25 copay   Authorization - Visit Number 3   Authorization - Number of Visits 60   OT Start Time 1600   OT Stop Time 1645   OT Time Calculation (min) 45 min   Activity Tolerance Patient tolerated treatment well   Behavior During Therapy [facility] for tasks assessed/performed      Past Medical History:  Diagnosis Date  . Diabetes mellitus    bordlerline  . Diverticulitis 2005   CT   . DJD (degenerative joint disease)   . GERD (gastroesophageal reflux disease)   . Headache(784.0)   . Hiatal hernia   . Hyperlipidemia   . Hypothyroidism   . Knee pain, right   . Low back pain   . Neuropathy (HCC)    peroneal nerve  . Obesity   . S/P colonoscopy 2005   Dr. Tamala Julian: sigmoid diverticulosis  . Thyroid disease   . Tobacco abuse     Past Surgical History:  Procedure Laterality Date  . BACK SURGERY    . COLONOSCOPY  12/18/2010   Procedure: COLONOSCOPY;  Surgeon: Dorothyann Peng, MD;  [facility];  Service: Endoscopy;  Laterality: N/A;  10:15am  . cyst removal right wrist    . TONSILLECTOMY    . TUBAL LIGATION    . umbillical hernia      There were no vitals filed for this visit.                    OT Treatments/Exercises (OP) - 12/30/15 0001      Exercises   Exercises Shoulder     Shoulder Exercises: Supine   Protraction PROM;AAROM;10 reps   Horizontal ABduction PROM;AAROM;10 reps   External Rotation PROM;AAROM;10 reps    Internal Rotation PROM;AAROM;10 reps   Flexion PROM;AAROM;10 reps   ABduction PROM;AAROM;10 reps     Shoulder Exercises: Standing   Protraction AAROM;10 reps   Horizontal ABduction AAROM;10 reps   External Rotation AAROM;10 reps   Internal Rotation AAROM;10 reps   Flexion AAROM;10 reps     Shoulder Exercises: Pulleys   Flexion 1 minute   ABduction 1 minute     Shoulder Exercises: ROM/Strengthening   Proximal Shoulder Strengthening, Supine 10X no rest breaks     Manual Therapy   Manual Therapy Myofascial release   Manual therapy comments Manual therapy completed prior to exercises.   Myofascial Release Myofascial release and manual stretching completed to Left upper arm, trapezius, and scapularis region.                OT Education - 12/30/15 1641    Education provided Yes   Education Details AA/ROM shoulder exercises   Person(s) Educated Patient   Methods Explanation;Demonstration;Verbal cues;Handout   Comprehension Returned demonstration;Verbalized understanding          OT Short Term Goals - 12/24/15 1727      OT SHORT TERM GOAL #1   Title Pt will  be provided with and educated on HEP.    Time 6   Period Weeks   Status On-going     OT SHORT TERM GOAL #2   Title Pt will decrease LUE pain to 3/10 or less to improve ability to use LUE during work tasks.    Time 6   Period Weeks   Status On-going     OT SHORT TERM GOAL #3   Title Pt will decrease fascial restrictions in LUE from max to moderate amounts or less to improve mobility in LUE during daily tasks.    Time 6   Period Weeks   Status On-going     OT SHORT TERM GOAL #4   Title Pt will improve A/ROM of LUE to [facility] to increase ability to complete overhead tasks with minimal pain.    Time 6   Period Weeks   Status On-going     OT SHORT TERM GOAL #5   Title Pt will increase LUE strength to 4+/5 to improve ability to complete lightweight lifting tasks at home and work.    Time 6   Period Weeks    Status On-going                  Plan - 12/30/15 1647    Clinical Impression Statement A: Updated HEP and completed AA/ROM standing as well as pulleys this session. Pt required VC for form and technique. Compaints of pain with descent of right arm during movements.   Plan P: Follow up on HEP. Increase to 12 repetitions. Add proximal shoulder strengthening standing.       Patient will benefit from skilled therapeutic intervention in order to improve the following deficits and impairments:  Decreased strength, Decreased range of motion, Impaired UE functional use, Pain, Increased fascial restricitons  Visit Diagnosis: Pain in left shoulder  Stiffness of left shoulder, not elsewhere classified  Other symptoms and signs involving the musculoskeletal system    Problem List Patient Active Problem List   Diagnosis Date Noted  . Shoulder pain, left 11/26/2015  . Cutaneous wart 09/30/2014  . HTN (hypertension) 01/01/2013  . Hyperlipidemia with target LDL less than 100 05/04/2012  . IGT (impaired glucose tolerance) 03/14/2011  . GERD (gastroesophageal reflux disease) 03/14/2011  . BACK PAIN, LUMBAR, CHRONIC 06/25/2009  . TOBACCO USER 12/13/2007  . Obesity 02/17/2007  . Hypothyroidism 06/14/2006  . DIVERTICULOSIS, COLON 03/01/2006  . HIATAL HERNIA WITH REFLUX, HX OF 03/01/2006   Ailene Ravel, OTR/L,CBIS  [phone]  12/30/2015, 4:49 PM  [place] [address] [place], [place], [address] Phone: [phone]   Fax:  [phone]  Name: ABEGAYLE Reyes MRN: [id number] Date of Birth: 1951-03-24

## 2015-12-30 NOTE — Patient Instructions (Signed)
Perform each exercise _____10-12___ reps. 2-3x days.   Protraction - STANDING  Start by holding a wand or cane at chest height.  Next, slowly push the wand outwards in front of your body so that your elbows become fully straightened. Then, return to the original position.     Shoulder FLEXION - STANDING - PALMS DOWN  In the standing position, hold a wand/cane with both arms, palms down on both sides. Raise up the wand/cane allowing your unaffected arm to perform most of the effort. Your affected arm should be partially relaxed.      Internal/External ROTATION - STANDING  In the standing position, hold a wand/cane with both hands keeping your elbows bent. Move your arms and wand/cane to one side.  Your affected arm should be partially relaxed while your unaffected arm performs most of the effort.       Shoulder ABDUCTION - STANDING  While holding a wand/cane palm face up on the injured side and palm face down on the uninjured side, slowly raise up your injured arm to the side.       Horizontal Abduction/Adduction      Straight arms holding cane at shoulder height, bring cane to right, center, left. Repeat starting to left.   Copyright  VHI. All rights reserved.       

## 2015-12-31 ENCOUNTER — Ambulatory Visit: Payer: BLUE CROSS/BLUE SHIELD | Admitting: Family Medicine

## 2016-01-06 ENCOUNTER — Ambulatory Visit (HOSPITAL_COMMUNITY): Payer: BLUE CROSS/BLUE SHIELD

## 2016-01-06 ENCOUNTER — Ambulatory Visit: Payer: BLUE CROSS/BLUE SHIELD | Admitting: Orthopaedic Surgery

## 2016-01-06 ENCOUNTER — Encounter: Payer: Self-pay | Admitting: Orthopaedic Surgery

## 2016-01-06 DIAGNOSIS — M25512 Pain in left shoulder: Secondary | ICD-10-CM

## 2016-01-06 DIAGNOSIS — M25612 Stiffness of left shoulder, not elsewhere classified: Secondary | ICD-10-CM

## 2016-01-06 DIAGNOSIS — R29898 Other symptoms and signs involving the musculoskeletal system: Secondary | ICD-10-CM

## 2016-01-07 ENCOUNTER — Encounter (HOSPITAL_COMMUNITY): Payer: Self-pay

## 2016-01-07 NOTE — Therapy (Signed)
[place], [place], [address] Phone: [phone]   Fax:  [phone]  Occupational Therapy Treatment  Patient Details  Name: Victoria Reyes MRN: [id number] Date of Birth: 16-Jul-1950 Referring Provider: Dr. Sanjuana Kava  Encounter Date: 01/06/2016      OT End of Session - 01/07/16 1505    Visit Number 4   Number of Visits 6   Date for OT Re-Evaluation 02/07/16  mini reassessment 01/07/16   Authorization Type BCBS   Authorization Time Period 60 visit limit. $25 copay   Authorization - Visit Number 4   Authorization - Number of Visits 37   OT Start Time 1600  Pt requested to leave early   OT Stop Time 1630   OT Time Calculation (min) 30 min   Activity Tolerance Patient tolerated treatment well   Behavior During Therapy [facility] for tasks assessed/performed      Past Medical History:  Diagnosis Date  . Diabetes mellitus    bordlerline  . Diverticulitis 2005   CT   . DJD (degenerative joint disease)   . GERD (gastroesophageal reflux disease)   . Headache(784.0)   . Hiatal hernia   . Hyperlipidemia   . Hypothyroidism   . Knee pain, right   . Low back pain   . Neuropathy (HCC)    peroneal nerve  . Obesity   . S/P colonoscopy 2005   Dr. Tamala Julian: sigmoid diverticulosis  . Thyroid disease   . Tobacco abuse     Past Surgical History:  Procedure Laterality Date  . BACK SURGERY    . COLONOSCOPY  12/18/2010   Procedure: COLONOSCOPY;  Surgeon: Dorothyann Peng, MD;  [facility];  Service: Endoscopy;  Laterality: N/A;  10:15am  . cyst removal right wrist    . TONSILLECTOMY    . TUBAL LIGATION    . umbillical hernia      There were no vitals filed for this visit.      Subjective Assessment - 01/07/16 1459    Subjective  S: I do my exercises but my arm still hurts.    Currently in Pain? Yes   Pain Score 6    Pain Location Shoulder   Pain Orientation Left   Pain Descriptors / Indicators  Aching   Pain Type Acute pain   Pain Radiating Towards shoulder down to elbow   Pain Onset 1 to 4 weeks ago   Pain Frequency Constant   Aggravating Factors  movement   Pain Relieving Factors heat   Effect of Pain on Daily Activities Pt reports that she continues to do what she needs to do. She push through the pain.   Multiple Pain Sites No            [facility] OT Assessment - 01/06/16 1504      Assessment   Diagnosis Left shoulder pain     Precautions   Precautions None                  OT Treatments/Exercises (OP) - 01/06/16 1504      Exercises   Exercises Shoulder     Shoulder Exercises: Supine   Protraction PROM;10 reps   Horizontal ABduction PROM;10 reps   External Rotation PROM;10 reps   Internal Rotation PROM;10 reps   Flexion PROM;10 reps   ABduction PROM;10 reps     Manual Therapy   Manual Therapy Myofascial release   Manual therapy comments Manual therapy completed prior  to exercises.   Myofascial Release Myofascial release and manual stretching completed to Left upper arm, trapezius, and scapularis region.                  OT Short Term Goals - 12/24/15 1727      OT SHORT TERM GOAL #1   Title Pt will be provided with and educated on HEP.    Time 6   Period Weeks   Status On-going     OT SHORT TERM GOAL #2   Title Pt will decrease LUE pain to 3/10 or less to improve ability to use LUE during work tasks.    Time 6   Period Weeks   Status On-going     OT SHORT TERM GOAL #3   Title Pt will decrease fascial restrictions in LUE from max to moderate amounts or less to improve mobility in LUE during daily tasks.    Time 6   Period Weeks   Status On-going     OT SHORT TERM GOAL #4   Title Pt will improve A/ROM of LUE to [facility] to increase ability to complete overhead tasks with minimal pain.    Time 6   Period Weeks   Status On-going     OT SHORT TERM GOAL #5   Title Pt will increase LUE strength to 4+/5 to improve ability to  complete lightweight lifting tasks at home and work.    Time 6   Period Weeks   Status On-going                  Plan - 01/07/16 1505    Clinical Impression Statement A: patient requested to leave early due to family issue. Session focused on pain management techniques including myofascial release and manual stretching. patient reports that she continues to have increased pain regardless of completing HEP at home. Patient was given reassurance that it will take time before beginning to feel some relief from arthritic pain.    Plan P: Increase to 12 repetitions. Add proximal shoulder strengthening standing. Complete mini reassessment      Patient will benefit from skilled therapeutic intervention in order to improve the following deficits and impairments:  Decreased strength, Decreased range of motion, Impaired UE functional use, Pain, Increased fascial restricitons  Visit Diagnosis: Pain in left shoulder  Stiffness of left shoulder, not elsewhere classified  Other symptoms and signs involving the musculoskeletal system    Problem List Patient Active Problem List   Diagnosis Date Noted  . Shoulder pain, left 11/26/2015  . Cutaneous wart 09/30/2014  . HTN (hypertension) 01/01/2013  . Hyperlipidemia with target LDL less than 100 05/04/2012  . IGT (impaired glucose tolerance) 03/14/2011  . GERD (gastroesophageal reflux disease) 03/14/2011  . BACK PAIN, LUMBAR, CHRONIC 06/25/2009  . TOBACCO USER 12/13/2007  . Obesity 02/17/2007  . Hypothyroidism 06/14/2006  . DIVERTICULOSIS, COLON 03/01/2006  . HIATAL HERNIA WITH REFLUX, HX OF 03/01/2006   Victoria Reyes, OTR/L,CBIS  [phone]  01/07/2016, 3:08 PM  [place] [address] [place], [place], [address] Phone: [phone]   Fax:  [phone]  Name: Victoria Reyes MRN: [id number] Date of Birth: 12-13-1950

## 2016-01-13 ENCOUNTER — Other Ambulatory Visit: Payer: Self-pay | Admitting: Family Medicine

## 2016-01-13 ENCOUNTER — Ambulatory Visit (HOSPITAL_COMMUNITY): Payer: BLUE CROSS/BLUE SHIELD | Attending: Orthopaedic Surgery | Admitting: Occupational Therapy

## 2016-01-13 ENCOUNTER — Encounter (HOSPITAL_COMMUNITY): Payer: Self-pay | Admitting: Occupational Therapy

## 2016-01-13 DIAGNOSIS — R29898 Other symptoms and signs involving the musculoskeletal system: Secondary | ICD-10-CM | POA: Insufficient documentation

## 2016-01-13 DIAGNOSIS — Z09 Encounter for follow-up examination after completed treatment for conditions other than malignant neoplasm: Secondary | ICD-10-CM

## 2016-01-13 DIAGNOSIS — M25612 Stiffness of left shoulder, not elsewhere classified: Secondary | ICD-10-CM

## 2016-01-13 DIAGNOSIS — M25512 Pain in left shoulder: Secondary | ICD-10-CM | POA: Diagnosis not present

## 2016-01-13 DIAGNOSIS — IMO0002 Reserved for concepts with insufficient information to code with codable children: Secondary | ICD-10-CM

## 2016-01-13 DIAGNOSIS — R229 Localized swelling, mass and lump, unspecified: Principal | ICD-10-CM

## 2016-01-13 NOTE — Therapy (Signed)
[place] [address] [place], [place], [address] Phone: [phone]   Fax:  [phone]  Occupational Therapy Treatment (mini reassessment)  Patient Details  Name: Victoria Reyes MRN: [id number] Date of Birth: 02-17-1951 Referring Provider: Dr. Sanjuana Kava  Encounter Date: 01/13/2016      OT End of Session - 01/13/16 1635    Visit Number 5   Number of Visits 6   Date for OT Re-Evaluation 02/07/16   Authorization Type BCBS   Authorization Time Period 60 visit limit. $25 copay   Authorization - Visit Number 5   Authorization - Number of Visits 68   OT Start Time 1607   OT Stop Time 1635   OT Time Calculation (min) 28 min   Activity Tolerance Patient tolerated treatment well   Behavior During Therapy [facility] for tasks assessed/performed      Past Medical History:  Diagnosis Date  . Diabetes mellitus    bordlerline  . Diverticulitis 2005   CT   . DJD (degenerative joint disease)   . GERD (gastroesophageal reflux disease)   . Headache(784.0)   . Hiatal hernia   . Hyperlipidemia   . Hypothyroidism   . Knee pain, right   . Low back pain   . Neuropathy (HCC)    peroneal nerve  . Obesity   . S/P colonoscopy 2005   Dr. Tamala Julian: sigmoid diverticulosis  . Thyroid disease   . Tobacco abuse     Past Surgical History:  Procedure Laterality Date  . BACK SURGERY    . COLONOSCOPY  12/18/2010   Procedure: COLONOSCOPY;  Surgeon: Dorothyann Peng, MD;  [facility];  Service: Endoscopy;  Laterality: N/A;  10:15am  . cyst removal right wrist    . TONSILLECTOMY    . TUBAL LIGATION    . umbillical hernia      There were no vitals filed for this visit.      Subjective Assessment - 01/13/16 1601    Subjective  S: I go back to the doctor on Tuesday.    Currently in Pain? Yes   Pain Score 6    Pain Location Shoulder   Pain Orientation Left   Pain Descriptors / Indicators Aching   Pain Type Acute pain   Pain  Radiating Towards shoulder down to elbow   Pain Onset 1 to 4 weeks ago   Pain Frequency Constant   Aggravating Factors  movement   Pain Relieving Factors heat   Effect of Pain on Daily Activities pushes through the pain to complete tasks   Multiple Pain Sites No           [facility] OT Assessment - 01/13/16 1600      Assessment   Diagnosis Left shoulder pain     Precautions   Precautions None     Palpation   Palpation comment Pt has max fascial restrictions in left anterior deltoid, upper arm, and trapezius regions     AROM   Overall AROM Comments Assessed seated, ER/IR adducted   AROM Assessment Site Shoulder   Right/Left Shoulder Left   Left Shoulder Flexion 130 Degrees  129 previous   Left Shoulder ABduction 90 Degrees  same as previous   Left Shoulder Internal Rotation 90 Degrees  same as previous   Left Shoulder External Rotation 51 Degrees  48 previous     PROM   Overall PROM Comments Assessed in supine, ER/IR adducted   PROM Assessment Site Shoulder  Right/Left Shoulder Left   Left Shoulder Flexion 173 Degrees  169 previous   Left Shoulder ABduction 145 Degrees  137 previous   Left Shoulder Internal Rotation 90 Degrees  same as previous   Left Shoulder External Rotation 61 Degrees  same as previous     Strength   Overall Strength Comments Assessed seated, ER/IR adducted   Strength Assessment Site Shoulder   Right/Left Shoulder Left   Left Shoulder Flexion 4/5  same as previous   Left Shoulder ABduction 4-/5  same as previous   Left Shoulder Internal Rotation 4/5  same as previous   Left Shoulder External Rotation 4/5  same as previous                  OT Treatments/Exercises (OP) - 01/13/16 1609      Exercises   Exercises Shoulder     Shoulder Exercises: Supine   Protraction PROM;AROM;10 reps   Horizontal ABduction PROM;AROM;10 reps   External Rotation PROM;10 reps   Internal Rotation PROM;10 reps   Flexion PROM;AROM;10 reps    ABduction PROM;10 reps     Manual Therapy   Manual Therapy Myofascial release   Manual therapy comments Manual therapy completed prior to exercises.   Myofascial Release Myofascial release and manual stretching completed to Left upper arm, trapezius, and scapularis region.                 OT Short Term Goals - 12/24/15 1727      OT SHORT TERM GOAL #1   Title Pt will be provided with and educated on HEP.    Time 6   Period Weeks   Status On-going     OT SHORT TERM GOAL #2   Title Pt will decrease LUE pain to 3/10 or less to improve ability to use LUE during work tasks.    Time 6   Period Weeks   Status On-going     OT SHORT TERM GOAL #3   Title Pt will decrease fascial restrictions in LUE from max to moderate amounts or less to improve mobility in LUE during daily tasks.    Time 6   Period Weeks   Status On-going     OT SHORT TERM GOAL #4   Title Pt will improve A/ROM of LUE to [facility] to increase ability to complete overhead tasks with minimal pain.    Time 6   Period Weeks   Status On-going     OT SHORT TERM GOAL #5   Title Pt will increase LUE strength to 4+/5 to improve ability to complete lightweight lifting tasks at home and work.    Time 6   Period Weeks   Status On-going                  Plan - 01/13/16 1636    Clinical Impression Statement A: Pt requested to leave session early due to pain limiting participation. Mini reassessment completed, pt has made slight progress towards goals, however unsure of accuracy of measurements due to pt experiencing increased pain this session.  Pt reports she thinks the weather is increasing her pain, however is going back to MD on Tuesday and will let us know if there is an update. Continued with manual therapy and P/ROM. Pt complete some A/ROM, unable to complete ER/IR due to pain.    Plan P: Follow up on MD appt.       Patient will benefit from skilled therapeutic intervention in order to improve  the  following deficits and impairments:  Decreased strength, Decreased range of motion, Impaired UE functional use, Pain, Increased fascial restricitons  Visit Diagnosis: Acute pain of left shoulder  Stiffness of left shoulder, not elsewhere classified  Other symptoms and signs involving the musculoskeletal system    Problem List Patient Active Problem List   Diagnosis Date Noted  . Shoulder pain, left 11/26/2015  . Cutaneous wart 09/30/2014  . HTN (hypertension) 01/01/2013  . Hyperlipidemia with target LDL less than 100 05/04/2012  . IGT (impaired glucose tolerance) 03/14/2011  . GERD (gastroesophageal reflux disease) 03/14/2011  . BACK PAIN, LUMBAR, CHRONIC 06/25/2009  . TOBACCO USER 12/13/2007  . Obesity 02/17/2007  . Hypothyroidism 06/14/2006  . DIVERTICULOSIS, COLON 03/01/2006  . HIATAL HERNIA WITH REFLUX, HX OF 03/01/2006   Guadelupe Sabin, OTR/L  [phone] 01/13/2016, 4:42 PM  [place] [address] [place], [place], [address] Phone: [phone]   Fax:  [phone]  Name: Victoria Reyes MRN: [id number] Date of Birth: [birth year]

## 2016-01-14 ENCOUNTER — Telehealth: Payer: Self-pay | Admitting: Family Medicine

## 2016-01-14 DIAGNOSIS — R229 Localized swelling, mass and lump, unspecified: Principal | ICD-10-CM

## 2016-01-14 DIAGNOSIS — Z09 Encounter for follow-up examination after completed treatment for conditions other than malignant neoplasm: Secondary | ICD-10-CM

## 2016-01-14 DIAGNOSIS — IMO0002 Reserved for concepts with insufficient information to code with codable children: Secondary | ICD-10-CM

## 2016-01-14 NOTE — Telephone Encounter (Signed)
Orders entered

## 2016-01-14 NOTE — Telephone Encounter (Signed)
Please put in orders for Ka to have a U/S limited Lft Breast U/S and U/S limited Rt Breast U/S and have Dr. Moshe Cipro e-sign the orders per Izora Gala in Radiology. Thanks

## 2016-01-20 ENCOUNTER — Encounter: Payer: Self-pay | Admitting: Family Medicine

## 2016-01-20 ENCOUNTER — Ambulatory Visit (HOSPITAL_COMMUNITY)
Admission: RE | Admit: 2016-01-20 | Discharge: 2016-01-20 | Disposition: A | Discharge status: Home / Self Care | Payer: BLUE CROSS/BLUE SHIELD | Source: Ambulatory Visit | Attending: Family Medicine | Admitting: Family Medicine

## 2016-01-20 ENCOUNTER — Ambulatory Visit (INDEPENDENT_AMBULATORY_CARE_PROVIDER_SITE_OTHER): Payer: BLUE CROSS/BLUE SHIELD | Admitting: Family Medicine

## 2016-01-20 ENCOUNTER — Ambulatory Visit (HOSPITAL_COMMUNITY): Payer: BLUE CROSS/BLUE SHIELD | Admitting: Occupational Therapy

## 2016-01-20 VITALS — BP 120/80 | HR 80 | Ht 63.0 in | Wt 192.0 lb

## 2016-01-20 DIAGNOSIS — E559 Vitamin D deficiency, unspecified: Secondary | ICD-10-CM

## 2016-01-20 DIAGNOSIS — R7302 Impaired glucose tolerance (oral): Secondary | ICD-10-CM

## 2016-01-20 DIAGNOSIS — M542 Cervicalgia: Secondary | ICD-10-CM

## 2016-01-20 DIAGNOSIS — IMO0001 Reserved for inherently not codable concepts without codable children: Secondary | ICD-10-CM

## 2016-01-20 DIAGNOSIS — M50322 Other cervical disc degeneration at C5-C6 level: Secondary | ICD-10-CM | POA: Diagnosis not present

## 2016-01-20 DIAGNOSIS — E038 Other specified hypothyroidism: Secondary | ICD-10-CM | POA: Diagnosis not present

## 2016-01-20 DIAGNOSIS — N6310 Unspecified lump in the right breast, unspecified quadrant: Secondary | ICD-10-CM | POA: Diagnosis not present

## 2016-01-20 DIAGNOSIS — IMO0002 Reserved for concepts with insufficient information to code with codable children: Secondary | ICD-10-CM

## 2016-01-20 DIAGNOSIS — R229 Localized swelling, mass and lump, unspecified: Secondary | ICD-10-CM

## 2016-01-20 DIAGNOSIS — E785 Hyperlipidemia, unspecified: Secondary | ICD-10-CM | POA: Diagnosis not present

## 2016-01-20 DIAGNOSIS — I10 Essential (primary) hypertension: Secondary | ICD-10-CM

## 2016-01-20 DIAGNOSIS — M25612 Stiffness of left shoulder, not elsewhere classified: Secondary | ICD-10-CM

## 2016-01-20 DIAGNOSIS — R29898 Other symptoms and signs involving the musculoskeletal system: Secondary | ICD-10-CM

## 2016-01-20 DIAGNOSIS — M25512 Pain in left shoulder: Secondary | ICD-10-CM

## 2016-01-20 DIAGNOSIS — E6609 Other obesity due to excess calories: Secondary | ICD-10-CM

## 2016-01-20 DIAGNOSIS — G8929 Other chronic pain: Secondary | ICD-10-CM

## 2016-01-20 NOTE — Patient Instructions (Addendum)
Physical exam March 31 or after, call if you need me sooner  X ray of neck today, and mRI ordered of left shoulder , we will call with appt info  Continue to remain nicotine free , you report stopping in May 2017  Fasting cBC, lipid, cmp, tSH, vit D in 5 months  It is important that you exercise regularly at least 30 minutes 5 times a week. If you develop chest pain, have severe difficulty breathing, or feel very tired, stop exercising immediately and seek medical attention     DASH Eating Plan DASH stands for "Dietary Approaches to Stop Hypertension." The DASH eating plan is a healthy eating plan that has been shown to reduce high blood pressure (hypertension). Additional health benefits may include reducing the risk of type 2 diabetes mellitus, heart disease, and stroke. The DASH eating plan may also help with weight loss. WHAT DO I NEED TO KNOW ABOUT THE DASH EATING PLAN? For the DASH eating plan, you will follow these general guidelines:  Choose foods with a percent daily value for sodium of less than 5% (as listed on the food label).  Use salt-free seasonings or herbs instead of table salt or sea salt.  Check with your health care provider or pharmacist before using salt substitutes.  Eat lower-sodium products, often labeled as "lower sodium" or "no salt added."  Eat fresh foods.  Eat more vegetables, fruits, and low-fat dairy products.  Choose whole grains. Look for the word "whole" as the first word in the ingredient list.  Choose fish and skinless chicken or [place] more often than red meat. Limit fish, poultry, and meat to 6 oz (170 g) each day.  Limit sweets, desserts, sugars, and sugary drinks.  Choose heart-healthy fats.  Limit cheese to 1 oz (28 g) per day.  Eat more home-cooked food and less restaurant, buffet, and fast food.  Limit fried foods.  Cook foods using methods other than frying.  Limit canned vegetables. If you do use them, rinse them well to  decrease the sodium.  When eating at a restaurant, ask that your food be prepared with less salt, or no salt if possible. WHAT FOODS CAN I EAT? Seek help from a dietitian for individual calorie needs. Grains Whole grain or whole wheat bread. Brown rice. Whole grain or whole wheat pasta. Quinoa, bulgur, and whole grain cereals. Low-sodium cereals. Corn or whole wheat flour tortillas. Whole grain cornbread. Whole grain crackers. Low-sodium crackers. Vegetables Fresh or frozen vegetables (raw, steamed, roasted, or grilled). Low-sodium or reduced-sodium tomato and vegetable juices. Low-sodium or reduced-sodium tomato sauce and paste. Low-sodium or reduced-sodium canned vegetables.  Fruits All fresh, canned (in natural juice), or frozen fruits. Meat and Other Protein Products Ground beef (85% or leaner), grass-fed beef, or beef trimmed of fat. Skinless chicken or [place]. Ground chicken or [place]. Pork trimmed of fat. All fish and seafood. Eggs. Dried beans, peas, or lentils. Unsalted nuts and seeds. Unsalted canned beans. Dairy Low-fat dairy products, such as skim or 1% milk, 2% or reduced-fat cheeses, low-fat ricotta or cottage cheese, or plain low-fat yogurt. Low-sodium or reduced-sodium cheeses. Fats and Oils Tub margarines without trans fats. Light or reduced-fat mayonnaise and salad dressings (reduced sodium). Avocado. Safflower, olive, or canola oils. Natural peanut or almond butter. Other Unsalted popcorn and pretzels. The items listed above may not be a complete list of recommended foods or beverages. Contact your dietitian for more options. WHAT FOODS ARE NOT RECOMMENDED? Grains White bread. White pasta. White  rice. Refined cornbread. Bagels and croissants. Crackers that contain trans fat. Vegetables Creamed or fried vegetables. Vegetables in a cheese sauce. Regular canned vegetables. Regular canned tomato sauce and paste. Regular tomato and vegetable juices. Fruits Dried fruits. Canned  fruit in light or heavy syrup. Fruit juice. Meat and Other Protein Products Fatty cuts of meat. Ribs, chicken wings, bacon, sausage, bologna, salami, chitterlings, fatback, hot dogs, bratwurst, and packaged luncheon meats. Salted nuts and seeds. Canned beans with salt. Dairy Whole or 2% milk, cream, half-and-half, and cream cheese. Whole-fat or sweetened yogurt. Full-fat cheeses or blue cheese. Nondairy creamers and whipped toppings. Processed cheese, cheese spreads, or cheese curds. Condiments Onion and garlic salt, seasoned salt, table salt, and sea salt. Canned and packaged gravies. Worcestershire sauce. Tartar sauce. Barbecue sauce. Teriyaki sauce. Soy sauce, including reduced sodium. Steak sauce. Fish sauce. Oyster sauce. Cocktail sauce. Horseradish. Ketchup and mustard. Meat flavorings and tenderizers. Bouillon cubes. Hot sauce. Tabasco sauce. Marinades. Taco seasonings. Relishes. Fats and Oils Butter, stick margarine, lard, shortening, ghee, and bacon fat. Coconut, palm kernel, or palm oils. Regular salad dressings. Other Pickles and olives. Salted popcorn and pretzels. The items listed above may not be a complete list of foods and beverages to avoid. Contact your dietitian for more information. WHERE CAN I FIND MORE INFORMATION? National Heart, Lung, and Blood Institute: travelstabloid.com   This information is not intended to replace advice given to you by your health care provider. Make sure you discuss any questions you have with your health care provider.   Document Released: 03/18/2011 Document Revised: 04/19/2014 Document Reviewed: 01/31/2013 Elsevier Interactive Patient Education [employer].

## 2016-01-20 NOTE — Therapy (Signed)
[place], [place], [address] Phone: [phone]   Fax:  [phone]  Occupational Therapy Treatment  Patient Details  Name: Victoria Reyes MRN: [id number] Date of Birth: 11-28-1950 Referring Provider: Dr. Sanjuana Kava  Encounter Date: 01/20/2016      OT End of Session - 01/20/16 1653    Visit Number 6   Number of Visits 8   Date for OT Re-Evaluation 02/07/16   Authorization Type [employer]   Authorization Time Period 60 visit limit. $25 copay   Authorization - Visit Number 6   Authorization - Number of Visits 18   OT Start Time 1601   OT Stop Time 1640   OT Time Calculation (min) 39 min   Activity Tolerance Patient tolerated treatment well   Behavior During Therapy [facility] for tasks assessed/performed      Past Medical History:  Diagnosis Date  . Diabetes mellitus    bordlerline  . Diverticulitis 2005   CT   . DJD (degenerative joint disease)   . GERD (gastroesophageal reflux disease)   . Headache(784.0)   . Hiatal hernia   . Hyperlipidemia   . Hypothyroidism   . Knee pain, right   . Low back pain   . Neuropathy (HCC)    peroneal nerve  . Obesity   . S/P colonoscopy 2005   Dr. Tamala Julian: sigmoid diverticulosis  . Thyroid disease   . Tobacco abuse     Past Surgical History:  Procedure Laterality Date  . BACK SURGERY    . COLONOSCOPY  12/18/2010   Procedure: COLONOSCOPY;  Surgeon: Dorothyann Peng, MD;  Location: [facility];  Service: Endoscopy;  Laterality: N/A;  10:15am  . cyst removal right wrist    . TONSILLECTOMY    . TUBAL LIGATION    . umbillical hernia      There were no vitals filed for this visit.      Subjective Assessment - 01/20/16 1605    Subjective  S: They scheduled an MRI for next week.    Currently in Pain? Yes   Pain Score 6    Pain Location Shoulder   Pain Orientation Left   Pain Descriptors / Indicators Aching   Pain Type Acute pain   Pain Radiating Towards shoulder  down to elbow   Pain Onset 1 to 4 weeks ago   Pain Frequency Constant   Aggravating Factors  movement   Pain Relieving Factors heat   Effect of Pain on Daily Activities pain experienced with all tasks   Multiple Pain Sites No            [facility] OT Assessment - 01/20/16 1603      Assessment   Diagnosis Left shoulder pain     Precautions   Precautions None                  OT Treatments/Exercises (OP) - 01/20/16 1606      Exercises   Exercises Shoulder     Shoulder Exercises: Supine   Protraction PROM;AROM;10 reps   Horizontal ABduction PROM;AROM;10 reps   External Rotation PROM;AROM;10 reps   Internal Rotation PROM;AROM;10 reps   Flexion PROM;AROM;10 reps   ABduction PROM;AROM;10 reps     Shoulder Exercises: Standing   Protraction AROM;10 reps   Horizontal ABduction AROM;10 reps   External Rotation AROM;10 reps   Internal Rotation AROM;10 reps   Flexion AROM;10 reps   ABduction AAROM;10 reps     Shoulder  Exercises: ROM/Strengthening   Wall Wash 1'   Proximal Shoulder Strengthening, Supine 10X no rest breaks     Manual Therapy   Manual Therapy Myofascial release   Manual therapy comments Manual therapy completed prior to exercises.   Myofascial Release Myofascial release and manual stretching completed to Left upper arm, trapezius, and scapularis region.                OT Education - 01/20/16 1635    Education provided Yes   Education Details A/ROM shoulder   Person(s) Educated Patient   Methods Explanation;Demonstration;Handout;Verbal cues   Comprehension Verbalized understanding;Returned demonstration          OT Short Term Goals - 12/24/15 1727      OT SHORT TERM GOAL #1   Title Pt will be provided with and educated on HEP.    Time 6   Period Weeks   Status On-going     OT SHORT TERM GOAL #2   Title Pt will decrease LUE pain to 3/10 or less to improve ability to use LUE during work tasks.    Time 6   Period Weeks   Status  On-going     OT SHORT TERM GOAL #3   Title Pt will decrease fascial restrictions in LUE from max to moderate amounts or less to improve mobility in LUE during daily tasks.    Time 6   Period Weeks   Status On-going     OT SHORT TERM GOAL #4   Title Pt will improve A/ROM of LUE to [facility] to increase ability to complete overhead tasks with minimal pain.    Time 6   Period Weeks   Status On-going     OT SHORT TERM GOAL #5   Title Pt will increase LUE strength to 4+/5 to improve ability to complete lightweight lifting tasks at home and work.    Time 6   Period Weeks   Status On-going                  Plan - 01/20/16 1654    Clinical Impression Statement A: Pt reports MD took an x-ray today and is scheduled for and MRI next weeks. Pt continues to report increased pain, however was able to complete exercises this session whereas she was unable to complete full session at last visit. Added A/ROM in standing and wall wash today, pt achieving A/ROM [facility], verbal cuing required for form and technique.    Plan P: Follow up on MRI. Follow up on updated HEP. continue with A/ROM, add scapular theraband if pt able to tolerate   OT Home Exercise Plan shoulder stretches 10/10: A/ROM exercises      Patient will benefit from skilled therapeutic intervention in order to improve the following deficits and impairments:  Decreased strength, Decreased range of motion, Impaired UE functional use, Pain, Increased fascial restricitons  Visit Diagnosis: Acute pain of left shoulder  Stiffness of left shoulder, not elsewhere classified  Other symptoms and signs involving the musculoskeletal system    Problem List Patient Active Problem List   Diagnosis Date Noted  . Neck pain on left side 01/20/2016  . Shoulder pain, left 11/26/2015  . Cutaneous wart 09/30/2014  . HTN (hypertension) 01/01/2013  . Hyperlipidemia with target LDL less than 100 05/04/2012  . IGT (impaired glucose tolerance)  03/14/2011  . GERD (gastroesophageal reflux disease) 03/14/2011  . BACK PAIN, LUMBAR, CHRONIC 06/25/2009  . Obesity 02/17/2007  . Hypothyroidism 06/14/2006  . DIVERTICULOSIS,  COLON 03/01/2006  . HIATAL HERNIA WITH REFLUX, HX OF 03/01/2006   Guadelupe Sabin, OTR/L  [phone] 01/20/2016, 4:56 PM  [place] [address] [place], [place], [address] Phone: [phone]   Fax:  [phone]  Name: Victoria Reyes MRN: [id number] Date of Birth: 1950/11/29

## 2016-01-20 NOTE — Patient Instructions (Signed)

## 2016-01-20 NOTE — Assessment & Plan Note (Addendum)
Reports  Ongoing pain for 5 months rated at a 10, despite therapy, and medication will order mRI, limitation in motion and point tenderness on exam today

## 2016-01-24 ENCOUNTER — Other Ambulatory Visit: Payer: Self-pay | Admitting: Family Medicine

## 2016-01-25 NOTE — Progress Notes (Signed)
Victoria Reyes     MRN: [id number]      DOB: 17-Jun-1950   HPI Victoria Reyes is here for follow up and re-evaluation of chronic medical conditions, medication management and review of any available recent lab and radiology data.  Preventive health is updated, specifically  Cancer screening and Immunization.   Questions or concerns regarding consultations or procedures which the PT has had in the interim are  addressed. C/o debilitating left shoulder pain x 5 months, no response to anti inflammatories and PT per pt, aggravated by her job, affects sleep negatively, has been followed by ortho , but wants MRI of shoulder due to ongoing debility and pain Also c/o left neck and shoulder pain, j has had lumbar spine surgery, feels however that localized shoulder symptoms are an issue   ROS Denies recent fever or chills. Denies sinus pressure, nasal congestion, ear pain or sore throat. Denies chest congestion, productive cough or wheezing. Denies chest pains, palpitations and leg swelling Denies abdominal pain, nausea, vomiting,diarrhea or constipation.   Denies dysuria, frequency, hesitancy or incontinence. . Denies headaches, seizures, numbness, or tingling. Denies depression, anxiety or insomnia. Denies skin break down or rash.   PE  BP 120/80   Pulse 80   Ht 5\' 3"  (1.6 m)   Wt 192 lb (87.1 kg)   SpO2 96%   BMI 34.01 kg/m   Patient alert and oriented and in no cardiopulmonary distress.  HEENT: No facial asymmetry, EOMI,   oropharynx pink and moist.  Neck decreased ROM with left neck spasm no JVD, no mass.  Chest: Clear to auscultation bilaterally.  CVS: S1, S2 no murmurs, no S3.Regular rate.  ABD: Soft non tender.   Ext: No edema  MS: Adequate ROM spine,  hips and knees.Decreased ROM left shoulder  Skin: Intact, no ulcerations or rash noted.  Psych: Good eye contact, normal affect. Memory intact not anxious or depressed appearing.  CNS: CN 2-12 intact, power,  normal  throughout.no focal deficits noted.   Assessment & Plan  Shoulder pain, left Reports  Ongoing pain for 5 months rated at a 10, despite therapy, and medication will order mRI, limitation in motion and point tenderness on exam today  HTN (hypertension) Controlled, no change in medication DASH diet and commitment to daily physical activity for a minimum of 30 minutes discussed and encouraged, as a part of hypertension management. The importance of attaining a healthy weight is also discussed.  BP/Weight 01/20/2016 12/16/2015 12/02/2015 11/26/2015 07/09/2015 07/02/2015 123456  Systolic BP 123456 123456 Q000111Q [id number] 0000000 [id number]  Diastolic BP 80 71 82 80 82 78 80  Wt. (Lbs) 192 192 197.4 194 183 179 172.04  BMI 34.01 34.56 36.1 35.48 33.46 33.28 31.98       Neck pain on left side Dg c spine top further evaluate, and to assess  How much this may be impacting her left shoulder symptoms  Hyperlipidemia with target LDL less than 100 Hyperlipidemia:Low fat diet discussed and encouraged.   Lipid Panel  Lab Results  Component Value Date   CHOL 164 12/26/2015   HDL 74 12/26/2015   LDLCALC 70 12/26/2015   TRIG 101 12/26/2015   CHOLHDL 2.2 12/26/2015   Controlled, no change in medication    IGT (impaired glucose tolerance) Patient educated about the importance of limiting  Carbohydrate intake , the need to commit to daily physical activity for a minimum of 30 minutes , and to commit weight loss. The fact that changes in  all these areas will reduce or eliminate all together the development of diabetes is stressed.  Improved, pt applauded on this  Diabetic Labs Latest Ref Rng & Units 12/26/2015 07/02/2015 09/30/2014 03/11/2014 06/11/2013  HbA1c <5.7 % 5.6 5.7(H) 5.8(H) 6.0(H) 6.0(H)  Chol 125 - 200 mg/dL 164 199 195 172 188  HDL >=46 mg/dL 74 60 64 50 61  Calc LDL <130 mg/dL 70 114 92 97 100(H)  Triglycerides <150 mg/dL 101 126 195(H) 127 133  Creatinine 0.50 - 0.99 mg/dL 0.74 0.72 0.58 0.61 0.62    BP/Weight 01/20/2016 12/16/2015 12/02/2015 11/26/2015 07/09/2015 07/02/2015 123456  Systolic BP 123456 123456 Q000111Q [id number] 0000000 [id number]  Diastolic BP 80 71 82 80 82 78 80  Wt. (Lbs) 192 192 197.4 194 183 179 172.04  BMI 34.01 34.56 36.1 35.48 33.46 33.28 31.98   No flowsheet data found.    Obesity Unchnaged, patient re-educated about  the importance of commitment to a  minimum of 150 minutes of exercise per week.  The importance of healthy food choices with portion control discussed. Encouraged to start a food diary, count calories and to consider  joining a support group. Sample diet sheets offered. Goals set by the patient for the next several months.   Weight /BMI 01/20/2016 12/16/2015 12/02/2015  WEIGHT 192 lb 192 lb 197 lb 6.4 oz  HEIGHT 5\' 3"  5' 2.5" 5\' 2"   BMI 34.01 kg/m2 34.56 kg/m2 36.1 kg/m2      Hypothyroidism Controlled, no change in medication

## 2016-01-25 NOTE — Assessment & Plan Note (Signed)
Patient educated about the importance of limiting  Carbohydrate intake , the need to commit to daily physical activity for a minimum of 30 minutes , and to commit weight loss. The fact that changes in all these areas will reduce or eliminate all together the development of diabetes is stressed.  Improved, pt applauded on this  Diabetic Labs Latest Ref Rng & Units 12/26/2015 07/02/2015 09/30/2014 03/11/2014 06/11/2013  HbA1c <5.7 % 5.6 5.7(H) 5.8(H) 6.0(H) 6.0(H)  Chol 125 - 200 mg/dL 164 199 195 172 188  HDL >=46 mg/dL 74 60 64 50 61  Calc LDL <130 mg/dL 70 114 92 97 100(H)  Triglycerides <150 mg/dL 101 126 195(H) 127 133  Creatinine 0.50 - 0.99 mg/dL 0.74 0.72 0.58 0.61 0.62   BP/Weight 01/20/2016 12/16/2015 12/02/2015 11/26/2015 07/09/2015 07/02/2015 123456  Systolic BP 123456 123456 Q000111Q [id number] 0000000 [id number]  Diastolic BP 80 71 82 80 82 78 80  Wt. (Lbs) 192 192 197.4 194 183 179 172.04  BMI 34.01 34.56 36.1 35.48 33.46 33.28 31.98   No flowsheet data found.

## 2016-01-25 NOTE — Assessment & Plan Note (Signed)
Dg c spine top further evaluate, and to assess  How much this may be impacting her left shoulder symptoms

## 2016-01-25 NOTE — Assessment & Plan Note (Signed)
Controlled, no change in medication DASH diet and commitment to daily physical activity for a minimum of 30 minutes discussed and encouraged, as a part of hypertension management. The importance of attaining a healthy weight is also discussed.  BP/Weight 01/20/2016 12/16/2015 12/02/2015 11/26/2015 07/09/2015 07/02/2015 123456  Systolic BP 123456 123456 Q000111Q [id number] 0000000 [id number]  Diastolic BP 80 71 82 80 82 78 80  Wt. (Lbs) 192 192 197.4 194 183 179 172.04  BMI 34.01 34.56 36.1 35.48 33.46 33.28 31.98

## 2016-01-25 NOTE — Assessment & Plan Note (Signed)
Hyperlipidemia:Low fat diet discussed and encouraged.   Lipid Panel  Lab Results  Component Value Date   CHOL 164 12/26/2015   HDL 74 12/26/2015   LDLCALC 70 12/26/2015   TRIG 101 12/26/2015   CHOLHDL 2.2 12/26/2015   Controlled, no change in medication

## 2016-01-25 NOTE — Assessment & Plan Note (Signed)
Controlled, no change in medication  

## 2016-01-25 NOTE — Assessment & Plan Note (Signed)
Unchnaged, patient re-educated about  the importance of commitment to a  minimum of 150 minutes of exercise per week.  The importance of healthy food choices with portion control discussed. Encouraged to start a food diary, count calories and to consider  joining a support group. Sample diet sheets offered. Goals set by the patient for the next several months.   Weight /BMI 01/20/2016 12/16/2015 12/02/2015  WEIGHT 192 lb 192 lb 197 lb 6.4 oz  HEIGHT 5\' 3"  5' 2.5" 5\' 2"   BMI 34.01 kg/m2 34.56 kg/m2 36.1 kg/m2

## 2016-01-27 ENCOUNTER — Encounter (HOSPITAL_COMMUNITY): Payer: Self-pay

## 2016-01-27 ENCOUNTER — Ambulatory Visit (HOSPITAL_COMMUNITY): Payer: BLUE CROSS/BLUE SHIELD

## 2016-01-27 ENCOUNTER — Ambulatory Visit (HOSPITAL_COMMUNITY)
Admission: RE | Admit: 2016-01-27 | Discharge: 2016-01-27 | Disposition: A | Discharge status: Home / Self Care | Payer: BLUE CROSS/BLUE SHIELD | Source: Ambulatory Visit | Attending: Family Medicine | Admitting: Family Medicine

## 2016-01-27 DIAGNOSIS — M67814 Other specified disorders of tendon, left shoulder: Secondary | ICD-10-CM | POA: Insufficient documentation

## 2016-01-27 DIAGNOSIS — G8929 Other chronic pain: Secondary | ICD-10-CM

## 2016-01-27 DIAGNOSIS — M75102 Unspecified rotator cuff tear or rupture of left shoulder, not specified as traumatic: Secondary | ICD-10-CM | POA: Insufficient documentation

## 2016-01-27 DIAGNOSIS — M25412 Effusion, left shoulder: Secondary | ICD-10-CM | POA: Diagnosis not present

## 2016-01-27 DIAGNOSIS — M25612 Stiffness of left shoulder, not elsewhere classified: Secondary | ICD-10-CM

## 2016-01-27 DIAGNOSIS — M25512 Pain in left shoulder: Secondary | ICD-10-CM

## 2016-01-27 DIAGNOSIS — M65812 Other synovitis and tenosynovitis, left shoulder: Secondary | ICD-10-CM | POA: Insufficient documentation

## 2016-01-27 NOTE — Therapy (Signed)
[place], [place], [address] Phone: [phone]   Fax:  [phone]  Occupational Therapy Treatment  Patient Details  Name: Victoria Reyes MRN: [id number] Date of Birth: 1951-01-16 Referring Provider: Dr. Sanjuana Kava  Encounter Date: 01/27/2016      OT End of Session - 01/27/16 1657    Visit Number 7   Number of Visits 8   Date for OT Re-Evaluation 02/07/16   Authorization Type BCBS   Authorization Time Period 60 visit limit. $25 copay   Authorization - Visit Number 7   Authorization - Number of Visits 31   OT Start Time [id number]   OT Stop Time 1645   OT Time Calculation (min) 36 min   Activity Tolerance Patient tolerated treatment well   Behavior During Therapy [facility] for tasks assessed/performed      Past Medical History:  Diagnosis Date  . Diabetes mellitus    bordlerline  . Diverticulitis 2005   CT   . DJD (degenerative joint disease)   . GERD (gastroesophageal reflux disease)   . Headache(784.0)   . Hiatal hernia   . Hyperlipidemia   . Hypothyroidism   . Knee pain, right   . Low back pain   . Neuropathy (HCC)    peroneal nerve  . Obesity   . S/P colonoscopy 2005   Dr. Tamala Julian: sigmoid diverticulosis  . Thyroid disease   . Tobacco abuse     Past Surgical History:  Procedure Laterality Date  . BACK SURGERY    . COLONOSCOPY  12/18/2010   Procedure: COLONOSCOPY;  Surgeon: Dorothyann Peng, MD;  [facility];  Service: Endoscopy;  Laterality: N/A;  10:15am  . cyst removal right wrist    . TONSILLECTOMY    . TUBAL LIGATION    . umbillical hernia      There were no vitals filed for this visit.      Subjective Assessment - 01/27/16 1614    Subjective  S: It just hurts when i move it.    Currently in Pain? Yes   Pain Score 1    Pain Location Shoulder   Pain Orientation Left   Pain Descriptors / Indicators Aching   Pain Type Acute pain   Pain Radiating Towards shoulder down to  elbow   Pain Onset 1 to 4 weeks ago   Pain Frequency Constant   Aggravating Factors  movement   Pain Relieving Factors heat   Effect of Pain on Daily Activities pain experienced with all tasks.    Multiple Pain Sites No                      OT Treatments/Exercises (OP) - 01/27/16 1630      Exercises   Exercises Shoulder     Shoulder Exercises: Supine   Protraction PROM;AROM;10 reps   Horizontal ABduction PROM;AROM;10 reps   External Rotation PROM;AROM;10 reps   Internal Rotation PROM;AROM;10 reps   Flexion PROM;AROM;10 reps   ABduction PROM;AROM;10 reps     Shoulder Exercises: Standing   Protraction AROM;10 reps   Horizontal ABduction AROM;10 reps   External Rotation AROM;10 reps   Internal Rotation AROM;10 reps   Flexion AROM;10 reps   ABduction AAROM;10 reps     Shoulder Exercises: ROM/Strengthening   UBE (Upper Arm Bike) Level 1 reverse 1' forward 2'   X to V Arms 10X     Manual Therapy   Manual Therapy Myofascial release  Manual therapy comments Manual therapy completed prior to exercises.   Myofascial Release Myofascial release and manual stretching completed to Left upper arm, trapezius, and scapularis region.                  OT Short Term Goals - 12/24/15 1727      OT SHORT TERM GOAL #1   Title Pt will be provided with and educated on HEP.    Time 6   Period Weeks   Status On-going     OT SHORT TERM GOAL #2   Title Pt will decrease LUE pain to 3/10 or less to improve ability to use LUE during work tasks.    Time 6   Period Weeks   Status On-going     OT SHORT TERM GOAL #3   Title Pt will decrease fascial restrictions in LUE from max to moderate amounts or less to improve mobility in LUE during daily tasks.    Time 6   Period Weeks   Status On-going     OT SHORT TERM GOAL #4   Title Pt will improve A/ROM of LUE to [facility] to increase ability to complete overhead tasks with minimal pain.    Time 6   Period Weeks   Status  On-going     OT SHORT TERM GOAL #5   Title Pt will increase LUE strength to 4+/5 to improve ability to complete lightweight lifting tasks at home and work.    Time 6   Period Weeks   Status On-going                  Plan - 01/27/16 1658    Clinical Impression Statement A: Pt reports increased pain during passive stretching. MRI completed this afternoon and patient will know results later. Pt required max VC for participation as she complained of her arm being tired.    Plan P: Reassessment. Determine if more therapy is warrented or if patient is ready for discharge.      Patient will benefit from skilled therapeutic intervention in order to improve the following deficits and impairments:  Decreased strength, Decreased range of motion, Impaired UE functional use, Pain, Increased fascial restricitons  Visit Diagnosis: Acute pain of left shoulder  Stiffness of left shoulder, not elsewhere classified    Problem List Patient Active Problem List   Diagnosis Date Noted  . Neck pain on left side 01/20/2016  . Shoulder pain, left 11/26/2015  . Cutaneous wart 09/30/2014  . HTN (hypertension) 01/01/2013  . Hyperlipidemia with target LDL less than 100 05/04/2012  . IGT (impaired glucose tolerance) 03/14/2011  . GERD (gastroesophageal reflux disease) 03/14/2011  . BACK PAIN, LUMBAR, CHRONIC 06/25/2009  . Obesity 02/17/2007  . Hypothyroidism 06/14/2006  . DIVERTICULOSIS, COLON 03/01/2006  . HIATAL HERNIA WITH REFLUX, HX OF 03/01/2006   Ailene Ravel, OTR/L,CBIS  [phone]  01/27/2016, 5:01 PM  [place] [address] [place], [place], [address] Phone: [phone]   Fax:  [phone]  Name: Victoria Reyes MRN: [id number] Date of Birth: 1950/05/15

## 2016-02-02 ENCOUNTER — Telehealth: Payer: Self-pay

## 2016-02-02 ENCOUNTER — Telehealth: Payer: Self-pay | Admitting: Family Medicine

## 2016-02-02 DIAGNOSIS — M542 Cervicalgia: Secondary | ICD-10-CM

## 2016-02-02 DIAGNOSIS — M25512 Pain in left shoulder: Secondary | ICD-10-CM

## 2016-02-02 NOTE — Telephone Encounter (Signed)
Ok to refer her to ortho of her choice, if she has none, ortho at [facility] ([facility]) if they take her insurance

## 2016-02-02 NOTE — Telephone Encounter (Signed)
Called and left message for patient notifying that she should be receiving result letters.  Also that results are available on mychart.

## 2016-02-02 NOTE — Telephone Encounter (Signed)
Victoria Reyes is calling asking for x-ray results, please advise/

## 2016-02-02 NOTE — Telephone Encounter (Signed)
Patient is aware 

## 2016-02-03 ENCOUNTER — Ambulatory Visit (HOSPITAL_COMMUNITY): Payer: BLUE CROSS/BLUE SHIELD

## 2016-02-03 DIAGNOSIS — M25512 Pain in left shoulder: Secondary | ICD-10-CM

## 2016-02-03 DIAGNOSIS — M25612 Stiffness of left shoulder, not elsewhere classified: Secondary | ICD-10-CM

## 2016-02-03 DIAGNOSIS — R29898 Other symptoms and signs involving the musculoskeletal system: Secondary | ICD-10-CM

## 2016-02-04 ENCOUNTER — Encounter (HOSPITAL_COMMUNITY): Payer: Self-pay

## 2016-02-04 NOTE — Therapy (Signed)
[place], [place], [address] Phone: [phone]   Fax:  [phone]  Occupational Therapy Treatment And reassessment Patient Details  Name: Victoria Reyes MRN: [id number] Date of Birth: 1951/03/20 Referring Provider: Dr. Sanjuana Kava  Encounter Date: 02/03/2016      OT End of Session - 02/03/16 0922    Visit Number 8   Number of Visits 8   Authorization Type BCBS   Authorization Time Period 60 visit limit. $25 copay   Authorization - Visit Number 8   Authorization - Number of Visits 47   OT Start Time 1600  d/c today   OT Stop Time 1635   OT Time Calculation (min) 35 min   Activity Tolerance Patient tolerated treatment well   Behavior During Therapy [facility] for tasks assessed/performed      Past Medical History:  Diagnosis Date  . Diabetes mellitus    bordlerline  . Diverticulitis 2005   CT   . DJD (degenerative joint disease)   . GERD (gastroesophageal reflux disease)   . Headache(784.0)   . Hiatal hernia   . Hyperlipidemia   . Hypothyroidism   . Knee pain, right   . Low back pain   . Neuropathy (HCC)    peroneal nerve  . Obesity   . S/P colonoscopy 2005   Dr. Tamala Julian: sigmoid diverticulosis  . Thyroid disease   . Tobacco abuse     Past Surgical History:  Procedure Laterality Date  . BACK SURGERY    . COLONOSCOPY  12/18/2010   Procedure: COLONOSCOPY;  Surgeon: Dorothyann Peng, MD;  [facility];  Service: Endoscopy;  Laterality: N/A;  10:15am  . cyst removal right wrist    . TONSILLECTOMY    . TUBAL LIGATION    . umbillical hernia      There were no vitals filed for this visit.      Subjective Assessment - 02/03/16 1602    Subjective  Nothing has gotten better. Now I'm starting to feel pain in my chest and up my neck.   Special Tests FOTO score: 57/100   Currently in Pain? Yes   Pain Score 4    Pain Location Shoulder  sternum, neck   Pain Descriptors / Indicators  Aching;Sore   Pain Type Acute pain   Pain Radiating Towards up neck and to sternum   Pain Onset 1 to 4 weeks ago   Pain Frequency Constant   Aggravating Factors  movement   Pain Relieving Factors heat   Effect of Pain on Daily Activities pain experienced with all tasks   Multiple Pain Sites No            [facility] OT Assessment - 02/03/16 1609      Assessment   Diagnosis Left shoulder pain   Onset Date 11/08/15     Precautions   Precautions None     Prior Function   Level of Independence Independent     AROM   Overall AROM Comments Assessed seated, er/IR adducted   AROM Assessment Site Shoulder   Right/Left Shoulder Left   Left Shoulder Flexion 136 Degrees  previous: 130   Left Shoulder ABduction 90 Degrees  previous: same   Left Shoulder Internal Rotation 90 Degrees  previous: same   Left Shoulder External Rotation 68 Degrees  previous: 51     PROM   Overall PROM Comments Assessed in supine, er/IR adducted   Left Shoulder Flexion 143 Degrees  previous: 173   Left Shoulder ABduction 150 Degrees  previous: 150   Left Shoulder Internal Rotation 90 Degrees  previous: same   Left Shoulder External Rotation 90 Degrees  previous: 61     Strength   Overall Strength Comments Assessed seated, ER/IR adducted   Strength Assessment Site Shoulder   Right/Left Shoulder Left   Left Shoulder Flexion 3+/5  previous: 4/5   Left Shoulder ABduction 3/5  previous: 4-/5   Left Shoulder Internal Rotation 3+/5  previous: 4/5   Left Shoulder External Rotation 3+/5  previous: 4/5                  OT Treatments/Exercises (OP) - 02/03/16 1626      Exercises   Exercises Shoulder     Shoulder Exercises: Supine   Protraction PROM;10 reps   Horizontal ABduction PROM;10 reps   External Rotation PROM;10 reps   Internal Rotation PROM;10 reps   Flexion PROM;10 reps   ABduction PROM;10 reps     Manual Therapy   Manual Therapy Myofascial release   Manual therapy  comments Manual therapy completed prior to exercises.   Myofascial Release Myofascial release and manual stretching completed to Left upper arm, trapezius, and scapularis region.                OT Education - 02/03/16 [phone]    Education provided Yes   Education Details Recommended that patient continue completing HEP independently until she sees the othropedic doctor.    Person(s) Educated Patient   Methods Explanation   Comprehension Verbalized understanding          OT Short Term Goals - 02/03/16 1629      OT SHORT TERM GOAL #1   Title Pt will be provided with and educated on HEP.    Time 6   Period Weeks   Status Achieved     OT SHORT TERM GOAL #2   Title Pt will decrease LUE pain to 3/10 or less to improve ability to use LUE during work tasks.    Time 6   Period Weeks   Status Not Met     OT SHORT TERM GOAL #3   Title Pt will decrease fascial restrictions in LUE from max to moderate amounts or less to improve mobility in LUE during daily tasks.    Time 6   Period Weeks   Status Achieved     OT SHORT TERM GOAL #4   Title Pt will improve A/ROM of LUE to [facility] to increase ability to complete overhead tasks with minimal pain.    Time 6   Period Weeks   Status Not Met     OT SHORT TERM GOAL #5   Title Pt will increase LUE strength to 4+/5 to improve ability to complete lightweight lifting tasks at home and work.    Time 6   Period Weeks   Status Not Met                  Plan - 02/04/16 6283    Clinical Impression Statement A: Reassessment completed this date. patient has met 2/5 STGs. pt reports no decrease in pain. Fascial restrictions have decreased and some A/ROM and P/ROM have improved in the shoulder. Overall, patient has had limited progress and is going to [place] to meet with an orthopedic doctor for further recommendation.   Plan P: D/C and follow up with orthopedic MD.      Patient will benefit from skilled  therapeutic  intervention in order to improve the following deficits and impairments:  Decreased strength, Decreased range of motion, Impaired UE functional use, Pain, Increased fascial restricitons  Visit Diagnosis: Acute pain of left shoulder  Stiffness of left shoulder, not elsewhere classified  Other symptoms and signs involving the musculoskeletal system    Problem List Patient Active Problem List   Diagnosis Date Noted  . Neck pain on left side 01/20/2016  . Shoulder pain, left 11/26/2015  . Cutaneous wart 09/30/2014  . HTN (hypertension) 01/01/2013  . Hyperlipidemia with target LDL less than 100 05/04/2012  . IGT (impaired glucose tolerance) 03/14/2011  . GERD (gastroesophageal reflux disease) 03/14/2011  . BACK PAIN, LUMBAR, CHRONIC 06/25/2009  . Obesity 02/17/2007  . Hypothyroidism 06/14/2006  . DIVERTICULOSIS, COLON 03/01/2006  . HIATAL HERNIA WITH REFLUX, HX OF 03/01/2006     OCCUPATIONAL THERAPY DISCHARGE SUMMARY  Visits from Start of Care: 8  Current functional level related to goals / functional outcomes: See above   Remaining deficits: See above   Education / Equipment: AA/ROM shoulder exercises Plan: Patient agrees to discharge.  Patient goals were not met. Patient is being discharged due to meeting the stated rehab goals.  ?????          Ailene Ravel, OTR/L,CBIS  [phone]  02/04/2016, 9:27 AM  [place] [address] [place], [place], [address] Phone: [phone]   Fax:  [phone]  Name: Victoria Reyes MRN: [id number] Date of Birth: 1950-10-20

## 2016-02-05 NOTE — Telephone Encounter (Signed)
Referral entered  

## 2016-02-05 NOTE — Addendum Note (Signed)
Addended by: Denman George B on: 02/05/2016 10:42 AM   Modules accepted: Orders

## 2016-02-18 ENCOUNTER — Ambulatory Visit (INDEPENDENT_AMBULATORY_CARE_PROVIDER_SITE_OTHER): Payer: BLUE CROSS/BLUE SHIELD | Admitting: Orthopaedic Surgery

## 2016-02-18 ENCOUNTER — Encounter: Payer: Self-pay | Admitting: Orthopaedic Surgery

## 2016-02-18 VITALS — BP 136/70 | HR 89 | Temp 97.5°F | Ht 63.0 in | Wt 193.0 lb

## 2016-02-18 DIAGNOSIS — G8929 Other chronic pain: Secondary | ICD-10-CM

## 2016-02-18 DIAGNOSIS — M25512 Pain in left shoulder: Secondary | ICD-10-CM

## 2016-02-18 NOTE — Progress Notes (Signed)
Patient IA:[id number] Victoria Reyes, female DOB:July 16, 1950, 65 y.o. [id number]  Chief Complaint  Patient presents with  . Follow-up    REVIEW MRI LEFT SHOULDER    HPI  Victoria Reyes is a 65 y.o. female who has left shoulder pain.  She had a MRI of the left shoulder showing: IMPRESSION: 1. Moderate tendinosis of the supraspinatus tendon with a small partial thickness bursal surface tear. 2. Mild tendinosis of the infraspinatus tendon. 3. Mild tendinosis of the subscapularis tendon. 4. High-grade partial-thickness cartilage loss with areas of full-thickness cartilage loss of the glenohumeral joint. Small joint effusion with mild synovitis.  I have explained the findings to her.  She has more of an arthritic problem.  I have suggested she begin Aleve one bid pc.  I have given samples.  She may be candidate for total shoulder in the future.  HPI  Body mass index is 34.19 kg/m.  ROS  Review of Systems  HENT: Negative for congestion.   Respiratory: Negative for cough and shortness of breath.   Cardiovascular: Negative for chest pain and leg swelling.  Endocrine: Positive for cold intolerance.  Musculoskeletal: Positive for arthralgias.    Past Medical History:  Diagnosis Date  . Diabetes mellitus    bordlerline  . Diverticulitis 2005   CT   . DJD (degenerative joint disease)   . GERD (gastroesophageal reflux disease)   . Headache(784.0)   . Hiatal hernia   . Hyperlipidemia   . Hypothyroidism   . Knee pain, right   . Low back pain   . Neuropathy (HCC)    peroneal nerve  . Obesity   . S/P colonoscopy 2005   Dr. Tamala Julian: sigmoid diverticulosis  . Thyroid disease   . Tobacco abuse     Past Surgical History:  Procedure Laterality Date  . BACK SURGERY    . COLONOSCOPY  12/18/2010   Procedure: COLONOSCOPY;  Surgeon: Dorothyann Peng, MD;  [facility];  Service: Endoscopy;  Laterality: N/A;  10:15am  . cyst removal right wrist    . TONSILLECTOMY    . TUBAL  LIGATION    . umbillical hernia      Family History  Problem Relation Age of Onset  . Colon cancer Neg Hx     Social History Social History  Substance Use Topics  . Smoking status: Former Smoker    Packs/day: 0.50    Years: 40.00    Types: Cigarettes    Start date: 09/30/1963  . Smokeless tobacco: Never Used     Comment: smokes 8 per day  . Alcohol use No    Allergies  Allergen Reactions  . Hydrocodone     REACTION: Nausea  . Sulfa Antibiotics Itching    Current Outpatient Prescriptions  Medication Sig Dispense Refill  . amLODipine (NORVASC) 5 MG tablet TAKE 1 TABLET (5 MG TOTAL) BY MOUTH DAILY. 30 tablet 4  . gabapentin (NEURONTIN) 300 MG capsule TAKE 2 CAPSULES (300 MG TOTAL) BY MOUTH AT BEDTIME. 60 capsule 3  . levothyroxine (SYNTHROID, LEVOTHROID) 100 MCG tablet TAKE 1 TABLET BY MOUTH EVERY DAY BEFORE BREAKFAST, EXCEPT SATURDAY AND SUNDAY ONLY TAKE 1/2 TABLET 24 tablet 4  . meclizine (ANTIVERT) 25 MG tablet Take 1 tablet (25 mg total) by mouth 3 (three) times daily as needed for dizziness. 30 tablet 0  . omeprazole (PRILOSEC) 40 MG capsule TAKE 1 CAPSULE BY MOUTH DAILY. 30 capsule 3  . Vitamin D, Ergocalciferol, (DRISDOL) 50000 units CAPS capsule TAKE ONE CAPSULE  BY MOUTH EVERY 7 DAYS 12 capsule 1   No current facility-administered medications for this visit.      Physical Exam  Blood pressure 136/70, pulse 89, temperature 97.5 F (36.4 C), height 5\' 3"  (1.6 m), weight 193 lb (87.5 kg).  Constitutional: overall normal hygiene, normal nutrition, well developed, normal grooming, normal body habitus. Assistive device:none  Musculoskeletal: gait and station Limp none, muscle tone and strength are normal, no tremors or atrophy is present.  .  Neurological: coordination overall normal.  Deep tendon reflex/nerve stretch intact.  Sensation normal.  Cranial nerves II-XII intact.   Skin:   Normal overall no scars, lesions, ulcers or rashes. No psoriasis.  Psychiatric:  Alert and oriented x 3.  Recent memory intact, remote memory unclear.  Normal mood and affect. Well groomed.  Good eye contact.  Cardiovascular: overall no swelling, no varicosities, no edema bilaterally, normal temperatures of the legs and arms, no clubbing, cyanosis and good capillary refill.  Lymphatic: palpation is normal.  Examination of left Upper Extremity is done.  Inspection:   Overall:  Elbow non-tender without crepitus or defects, forearm non-tender without crepitus or defects, wrist non-tender without crepitus or defects, hand non-tender.    Shoulder: with glenohumeral joint tenderness, without effusion.   Upper arm: without swelling and tenderness   Range of motion:   Overall:  Full range of motion of the elbow, full range of motion of wrist and full range of motion in fingers.   Shoulder:  left  165 degrees forward flexion; 145 degrees abduction; 35 degrees internal rotation, 35 degrees external rotation, 15 degrees extension, 40 degrees adduction.   Stability:   Overall:  Shoulder, elbow and wrist stable   Strength and Tone:   Overall full shoulder muscles strength, full upper arm strength and normal upper arm bulk and tone.   The patient has been educated about the nature of the problem(s) and counseled on treatment options.  The patient appeared to understand what I have discussed and is in agreement with it.  Encounter Diagnosis  Name Primary?  . Chronic left shoulder pain Yes    PLAN Call if any problems.  Precautions discussed.  Continue current medications.   Return to clinic 1 month   Electronically Signed Sanjuana Kava, MD 11/8/20173:32 PM

## 2016-03-01 ENCOUNTER — Telehealth: Payer: Self-pay | Admitting: Family Medicine

## 2016-03-01 NOTE — Telephone Encounter (Signed)
Patient is asking if a 2nd opinion referral can be made to an Orthopedic Dr. For Shoulder Pain, she is not satisfied with Dr. Briscoe Burns findings, please advise?

## 2016-03-06 ENCOUNTER — Other Ambulatory Visit: Payer: Self-pay | Admitting: Family Medicine

## 2016-03-07 ENCOUNTER — Other Ambulatory Visit: Payer: Self-pay | Admitting: Family Medicine

## 2016-03-07 DIAGNOSIS — M25519 Pain in unspecified shoulder: Secondary | ICD-10-CM

## 2016-03-07 NOTE — Telephone Encounter (Signed)
Ok 2nd opinion, ppls refer to ortho at [facility] or [facility] , [place] office , refer to either place  That  Takes her ins , I am re sending generic referral

## 2016-03-08 NOTE — Telephone Encounter (Signed)
Referral has been sent to [facility] in [place]

## 2016-03-17 ENCOUNTER — Encounter: Payer: Self-pay | Admitting: Orthopaedic Surgery

## 2016-03-17 ENCOUNTER — Ambulatory Visit (INDEPENDENT_AMBULATORY_CARE_PROVIDER_SITE_OTHER): Payer: BLUE CROSS/BLUE SHIELD | Admitting: Orthopaedic Surgery

## 2016-03-17 VITALS — BP 175/83 | HR 83 | Temp 97.3°F | Ht 63.0 in | Wt 198.0 lb

## 2016-03-17 DIAGNOSIS — G8929 Other chronic pain: Secondary | ICD-10-CM | POA: Diagnosis not present

## 2016-03-17 DIAGNOSIS — M25512 Pain in left shoulder: Secondary | ICD-10-CM

## 2016-03-17 NOTE — Progress Notes (Signed)
Patient [id number] Victoria Reyes, female DOB:Mar 07, 1951, 65 y.o. [id number]  Chief Complaint  Patient presents with  . Shoulder Pain    left chronic    HPI  Victoria Reyes is a 65 y.o. female who has chronic left shoulder pain.  She has intolerance to pain medicine. She says the injection did not help.  She has some prednisone at home and she will try that.  I told her the findings of her MRI again.  She has full-thickness cartilage loss of glenohumeral joint which is causing the pain.  HPI  Body mass index is 35.07 kg/m.  ROS  Review of Systems  HENT: Negative for congestion.   Respiratory: Negative for cough and shortness of breath.   Cardiovascular: Negative for chest pain and leg swelling.  Endocrine: Positive for cold intolerance.  Musculoskeletal: Positive for arthralgias.    Past Medical History:  Diagnosis Date  . Diabetes mellitus    bordlerline  . Diverticulitis 2005   CT   . DJD (degenerative joint disease)   . GERD (gastroesophageal reflux disease)   . Headache(784.0)   . Hiatal hernia   . Hyperlipidemia   . Hypothyroidism   . Knee pain, right   . Low back pain   . Neuropathy (HCC)    peroneal nerve  . Obesity   . S/P colonoscopy 2005   Dr. Tamala Julian: sigmoid diverticulosis  . Thyroid disease   . Tobacco abuse     Past Surgical History:  Procedure Laterality Date  . BACK SURGERY    . COLONOSCOPY  12/18/2010   Procedure: COLONOSCOPY;  Surgeon: Dorothyann Peng, MD;  Location: [facility];  Service: Endoscopy;  Laterality: N/A;  10:15am  . cyst removal right wrist    . TONSILLECTOMY    . TUBAL LIGATION    . umbillical hernia      Family History  Problem Relation Age of Onset  . Colon cancer Neg Hx     Social History Social History  Substance Use Topics  . Smoking status: Former Smoker    Packs/day: 0.50    Years: 40.00    Types: Cigarettes    Start date: 09/30/1963  . Smokeless tobacco: Never Used     Comment: smokes 8 per day  . Alcohol  use No    Allergies  Allergen Reactions  . Hydrocodone     REACTION: Nausea  . Sulfa Antibiotics Itching    Current Outpatient Prescriptions  Medication Sig Dispense Refill  . amLODipine (NORVASC) 5 MG tablet TAKE 1 TABLET (5 MG TOTAL) BY MOUTH DAILY. 30 tablet 4  . gabapentin (NEURONTIN) 300 MG capsule TAKE 2 CAPSULES (300 MG TOTAL) BY MOUTH AT BEDTIME. 60 capsule 3  . levothyroxine (SYNTHROID, LEVOTHROID) 100 MCG tablet TAKE 1 TABLET BY MOUTH EVERY DAY BEFORE BREAKFAST, EXCEPT SATURDAY AND SUNDAY ONLY TAKE 1/2 TABLET 24 tablet 4  . meclizine (ANTIVERT) 25 MG tablet Take 1 tablet (25 mg total) by mouth 3 (three) times daily as needed for dizziness. 30 tablet 0  . omeprazole (PRILOSEC) 40 MG capsule TAKE 1 CAPSULE BY MOUTH DAILY. 30 capsule 3  . Vitamin D, Ergocalciferol, (DRISDOL) 50000 units CAPS capsule TAKE ONE CAPSULE BY MOUTH EVERY 7 DAYS 12 capsule 1   No current facility-administered medications for this visit.      Physical Exam  Blood pressure (!) 175/83, pulse 83, temperature 97.3 F (36.3 C), height 5\' 3"  (1.6 m), weight 198 lb (89.8 kg).  Constitutional: overall normal hygiene, normal nutrition,  well developed, normal grooming, normal body habitus. Assistive device:none  Musculoskeletal: gait and station Limp none, muscle tone and strength are normal, no tremors or atrophy is present.  .  Neurological: coordination overall normal.  Deep tendon reflex/nerve stretch intact.  Sensation normal.  Cranial nerves II-XII intact.   Skin:   Normal overall no scars, lesions, ulcers or rashes. No psoriasis.  Psychiatric: Alert and oriented x 3.  Recent memory intact, remote memory unclear.  Normal mood and affect. Well groomed.  Good eye contact.  Cardiovascular: overall no swelling, no varicosities, no edema bilaterally, normal temperatures of the legs and arms, no clubbing, cyanosis and good capillary refill.  Lymphatic: palpation is normal.  Examination of left Upper  Extremity is done.  Inspection:   Overall:  Elbow non-tender without crepitus or defects, forearm non-tender without crepitus or defects, wrist non-tender without crepitus or defects, hand non-tender.    Shoulder: with glenohumeral joint tenderness, without effusion.   Upper arm: without swelling and tenderness   Range of motion:   Overall:  Full range of motion of the elbow, full range of motion of wrist and full range of motion in fingers.   Shoulder:  left  145 degrees forward flexion; 120 degrees abduction; 30 degrees internal rotation, 30 degrees external rotation, 15 degrees extension, 35 degrees adduction.   Stability:   Overall:  Shoulder, elbow and wrist stable   Strength and Tone:   Overall full shoulder muscles strength, full upper arm strength and normal upper arm bulk and tone.   The patient has been educated about the nature of the problem(s) and counseled on treatment options.  The patient appeared to understand what I have discussed and is in agreement with it.  Encounter Diagnosis  Name Primary?  . Chronic left shoulder pain Yes    PLAN Call if any problems.  Precautions discussed.  Continue current medications.   Return to clinic 1 month   Electronically Signed Sanjuana Kava, MD 12/6/20173:18 PM

## 2016-04-02 ENCOUNTER — Ambulatory Visit (INDEPENDENT_AMBULATORY_CARE_PROVIDER_SITE_OTHER): Payer: Self-pay | Admitting: Orthopaedic Surgery

## 2016-04-09 ENCOUNTER — Ambulatory Visit (INDEPENDENT_AMBULATORY_CARE_PROVIDER_SITE_OTHER): Payer: Self-pay | Admitting: Orthopaedic Surgery

## 2016-04-20 ENCOUNTER — Ambulatory Visit (INDEPENDENT_AMBULATORY_CARE_PROVIDER_SITE_OTHER): Payer: BLUE CROSS/BLUE SHIELD | Admitting: Orthopaedic Surgery

## 2016-04-20 ENCOUNTER — Encounter: Payer: Self-pay | Admitting: Orthopaedic Surgery

## 2016-04-20 VITALS — BP 128/68 | HR 65 | Temp 97.5°F | Ht 63.0 in | Wt 201.0 lb

## 2016-04-20 DIAGNOSIS — G8929 Other chronic pain: Secondary | ICD-10-CM

## 2016-04-20 DIAGNOSIS — M25512 Pain in left shoulder: Secondary | ICD-10-CM | POA: Diagnosis not present

## 2016-04-20 NOTE — Progress Notes (Signed)
Daily aspirin to see Victoria Reyes regarding her left shoulder  She complains of anterior left shoulder pain. She says she complained much do all things that she wants to do. She just recently retired she is right-hand dominant she has no problems with activities of daily living just pain  She has no weakness or restrictions of motion at this time  On exam she has grade 5 strength in the supraspinatus tendon she has 45 of external rotation.  MRI shows intact rotator cuff with glenohumeral arthritis  Recommend total shoulder at her convenience. We recommended she see a replacement specialist for that. She will call us if she changes her mind about surgery

## 2016-04-20 NOTE — Progress Notes (Signed)
Patient Victoria Reyes [name], female DOB:1950-04-30, 66 y.o. [id number]  Chief Complaint  Patient presents with  . Shoulder Pain    left    HPI  Victoria Reyes is a 66 y.o. female who has chronic pain of the left shoulder.  She has had MRI showing loss of articular surface with intact rotator cuff.  She does not want to be on pain medicine.  She has no response with NSAIDs or injections.  She is tired of hurting.  I will have Dr. Aline Brochure see her about possible surgery.  She is agreeable to this. HPI  Body mass index is 35.61 kg/m.  ROS  Review of Systems  HENT: Negative for congestion.   Respiratory: Negative for cough and shortness of breath.   Cardiovascular: Negative for chest pain and leg swelling.  Endocrine: Positive for cold intolerance.  Musculoskeletal: Positive for arthralgias.    Past Medical History:  Diagnosis Date  . Diabetes mellitus    bordlerline  . Diverticulitis 2005   CT   . DJD (degenerative joint disease)   . GERD (gastroesophageal reflux disease)   . Headache(784.0)   . Hiatal hernia   . Hyperlipidemia   . Hypothyroidism   . Knee pain, right   . Low back pain   . Neuropathy (HCC)    peroneal nerve  . Obesity   . S/P colonoscopy 2005   Dr. Tamala Julian: sigmoid diverticulosis  . Thyroid disease   . Tobacco abuse     Past Surgical History:  Procedure Laterality Date  . BACK SURGERY    . COLONOSCOPY  12/18/2010   Procedure: COLONOSCOPY;  Surgeon: Dorothyann Peng, MD;  Location: [facility];  Service: Endoscopy;  Laterality: N/A;  10:15am  . cyst removal right wrist    . TONSILLECTOMY    . TUBAL LIGATION    . umbillical hernia      Family History  Problem Relation Age of Onset  . Colon cancer Neg Hx     Social History Social History  Substance Use Topics  . Smoking status: Former Smoker    Packs/day: 0.50    Years: 40.00    Types: Cigarettes    Start date: 09/30/1963  . Smokeless tobacco: Never Used     Comment: smokes 8 per day   . Alcohol use No    Allergies  Allergen Reactions  . Hydrocodone     REACTION: Nausea  . Sulfa Antibiotics Itching    Current Outpatient Prescriptions  Medication Sig Dispense Refill  . amLODipine (NORVASC) 5 MG tablet TAKE 1 TABLET (5 MG TOTAL) BY MOUTH DAILY. 30 tablet 4  . gabapentin (NEURONTIN) 300 MG capsule TAKE 2 CAPSULES (300 MG TOTAL) BY MOUTH AT BEDTIME. 60 capsule 3  . levothyroxine (SYNTHROID, LEVOTHROID) 100 MCG tablet TAKE 1 TABLET BY MOUTH EVERY DAY BEFORE BREAKFAST, EXCEPT SATURDAY AND SUNDAY ONLY TAKE 1/2 TABLET 24 tablet 4  . meclizine (ANTIVERT) 25 MG tablet Take 1 tablet (25 mg total) by mouth 3 (three) times daily as needed for dizziness. 30 tablet 0  . omeprazole (PRILOSEC) 40 MG capsule TAKE 1 CAPSULE BY MOUTH DAILY. 30 capsule 3  . Vitamin D, Ergocalciferol, (DRISDOL) 50000 units CAPS capsule TAKE ONE CAPSULE BY MOUTH EVERY 7 DAYS 12 capsule 1   No current facility-administered medications for this visit.      Physical Exam  Blood pressure 128/68, pulse 65, temperature 97.5 F (36.4 C), height 5\' 3"  (1.6 m), weight 201 lb (91.2 kg).  Constitutional:  overall normal hygiene, normal nutrition, well developed, normal grooming, normal body habitus. Assistive device:none  Musculoskeletal: gait and station Limp none, muscle tone and strength are normal, no tremors or atrophy is present.  .  Neurological: coordination overall normal.  Deep tendon reflex/nerve stretch intact.  Sensation normal.  Cranial nerves II-XII intact.   Skin:   Normal overall no scars, lesions, ulcers or rashes. No psoriasis.  Psychiatric: Alert and oriented x 3.  Recent memory intact, remote memory unclear.  Normal mood and affect. Well groomed.  Good eye contact.  Cardiovascular: overall no swelling, no varicosities, no edema bilaterally, normal temperatures of the legs and arms, no clubbing, cyanosis and good capillary refill.  Lymphatic: palpation is normal.  Examination of left  Upper Extremity is done.  Inspection:   Overall:  Elbow non-tender without crepitus or defects, forearm non-tender without crepitus or defects, wrist non-tender without crepitus or defects, hand non-tender.    Shoulder: with glenohumeral joint tenderness, without effusion.   Upper arm: without swelling and tenderness   Range of motion:   Overall:  Full range of motion of the elbow, full range of motion of wrist and full range of motion in fingers.   Shoulder:  left  165 degrees forward flexion; 145 degrees abduction; 35 degrees internal rotation, 35 degrees external rotation, 15 degrees extension, 40 degrees adduction.   Stability:   Overall:  Shoulder, elbow and wrist stable   Strength and Tone:   Overall full shoulder muscles strength, full upper arm strength and normal upper arm bulk and tone.   The patient has been educated about the nature of the problem(s) and counseled on treatment options.  The patient appeared to understand what I have discussed and is in agreement with it.  Encounter Diagnosis  Name Primary?  . Chronic left shoulder pain Yes    PLAN Call if any problems.  Precautions discussed.  Continue current medications.   Return to clinic to see Dr. Aline Brochure.   Electronically Signed Sanjuana Kava, MD 1/9/20184:06 PM

## 2016-05-03 ENCOUNTER — Ambulatory Visit (INDEPENDENT_AMBULATORY_CARE_PROVIDER_SITE_OTHER): Payer: BLUE CROSS/BLUE SHIELD | Admitting: Family Medicine

## 2016-05-03 ENCOUNTER — Encounter: Payer: Self-pay | Admitting: Family Medicine

## 2016-05-03 VITALS — BP 134/58 | HR 84 | Temp 98.3°F | Resp 20 | Ht 63.0 in | Wt 199.1 lb

## 2016-05-03 DIAGNOSIS — J208 Acute bronchitis due to other specified organisms: Secondary | ICD-10-CM | POA: Diagnosis not present

## 2016-05-03 DIAGNOSIS — R059 Cough, unspecified: Secondary | ICD-10-CM

## 2016-05-03 DIAGNOSIS — R05 Cough: Secondary | ICD-10-CM | POA: Diagnosis not present

## 2016-05-03 MED ORDER — BENZONATATE 100 MG PO CAPS: 100.0000 mg | ORAL_CAPSULE | Freq: Two times a day (BID) | ORAL | 0 refills | Status: DC | PRN

## 2016-05-03 MED ORDER — DM-GUAIFENESIN ER 30-600 MG PO TB12: 1.0000 | ORAL_TABLET | Freq: Two times a day (BID) | ORAL | 0 refills | Status: DC

## 2016-05-03 NOTE — Progress Notes (Signed)
Chief Complaint  Patient presents with  . URI    since friday   Cough for 4 days, white sputum.  No fever or chills. No cold symptoms or sinus drainage.  No sore throat.  No chest pain.  No wheeze. Denies smoking No underlying asthma or COPD or lung disease  Patient Active Problem List   Diagnosis Date Noted  . Neck pain on left side 01/20/2016  . Shoulder pain, left 11/26/2015  . Cutaneous wart 09/30/2014  . HTN (hypertension) 01/01/2013  . Hyperlipidemia with target LDL less than 100 05/04/2012  . IGT (impaired glucose tolerance) 03/14/2011  . GERD (gastroesophageal reflux disease) 03/14/2011  . BACK PAIN, LUMBAR, CHRONIC 06/25/2009  . Obesity 02/17/2007  . Hypothyroidism 06/14/2006  . DIVERTICULOSIS, COLON 03/01/2006  . HIATAL HERNIA WITH REFLUX, HX OF 03/01/2006    Outpatient Encounter Prescriptions as of 05/03/2016  Medication Sig  . amLODipine (NORVASC) 5 MG tablet TAKE 1 TABLET (5 MG TOTAL) BY MOUTH DAILY.  Marland Kitchen gabapentin (NEURONTIN) 300 MG capsule TAKE 2 CAPSULES (300 MG TOTAL) BY MOUTH AT BEDTIME.  Marland Kitchen levothyroxine (SYNTHROID, LEVOTHROID) 100 MCG tablet TAKE 1 TABLET BY MOUTH EVERY DAY BEFORE BREAKFAST, EXCEPT SATURDAY AND SUNDAY ONLY TAKE 1/2 TABLET  . meclizine (ANTIVERT) 25 MG tablet Take 1 tablet (25 mg total) by mouth 3 (three) times daily as needed for dizziness.  Marland Kitchen omeprazole (PRILOSEC) 40 MG capsule TAKE 1 CAPSULE BY MOUTH DAILY.  Marland Kitchen Vitamin D, Ergocalciferol, (DRISDOL) 50000 units CAPS capsule TAKE ONE CAPSULE BY MOUTH EVERY 7 DAYS  . benzonatate (TESSALON) 100 MG capsule Take 1 capsule (100 mg total) by mouth 2 (two) times daily as needed for cough.  . dextromethorphan-guaiFENesin (MUCINEX DM) 30-600 MG 12hr tablet Take 1 tablet by mouth 2 (two) times daily.   No facility-administered encounter medications on file as of 05/03/2016.     Allergies  Allergen Reactions  . Hydrocodone     REACTION: Nausea  . Sulfa Antibiotics Itching    Review of Systems    Constitutional: Negative for chills, fatigue and fever.  HENT: Negative for congestion, postnasal drip, rhinorrhea, sinus pain, sinus pressure and sore throat.   Eyes: Negative for redness and visual disturbance.  Respiratory: Positive for cough. Negative for shortness of breath and wheezing.   Cardiovascular: Negative for chest pain and palpitations.  Gastrointestinal: Negative for diarrhea, nausea and vomiting.  Genitourinary: Negative for difficulty urinating and frequency.  Musculoskeletal: Negative for arthralgias and myalgias.  Neurological: Negative for dizziness and headaches.  Psychiatric/Behavioral: Negative for sleep disturbance.    BP (!) 134/58 (BP Location: Right Arm, Patient Position: Sitting, Cuff Size: Normal)   Pulse 84   Temp 98.3 F (36.8 C) (Oral)   Resp 20   Ht 5\' 3"  (1.6 m)   Wt 199 lb 1.9 oz (90.3 kg)   SpO2 99%   BMI 35.27 kg/m   Physical Exam  Constitutional: She is oriented to person, place, and time. She appears well-developed and well-nourished. No distress.  HENT:  Head: Normocephalic and atraumatic.  Right Ear: External ear normal.  Left Ear: External ear normal.  Mouth/Throat: Oropharynx is clear and moist.  Eyes: Conjunctivae are normal. Pupils are equal, round, and reactive to light.  Neck: Normal range of motion. Neck supple. No thyromegaly present.  Cardiovascular: Normal rate, regular rhythm and normal heart sounds.   Pulmonary/Chest: Effort normal and breath sounds normal. No respiratory distress.  Lungs are clear.  Barking cough.  Abdominal: Soft. Bowel sounds  are normal.  Musculoskeletal: Normal range of motion. She exhibits no edema.  Lymphadenopathy:    She has no cervical adenopathy.  Neurological: She is alert and oriented to person, place, and time.  Gait normal  Skin: Skin is warm and dry.  Psychiatric: She has a normal mood and affect. Her behavior is normal. Thought content normal.  Nursing note and vitals  reviewed.  ASSESSMENT/PLAN:  1. Acute bronchitis, viral  Discussed NOT using antibiotics for viral illness 2. Cough    Patient Instructions  Run a humidifier in the bedroom Drink lots of fluids Take the tessalon for cough Take the mucinex for congestion and to loosen mucous Call if the cough is not gone in 7-10 days    Raylene Everts, MD

## 2016-05-03 NOTE — Patient Instructions (Signed)
Run a humidifier in the bedroom Drink lots of fluids Take the tessalon for cough Take the mucinex for congestion and to loosen mucous Call if the cough is not gone in 7-10 days

## 2016-05-05 ENCOUNTER — Other Ambulatory Visit: Payer: Self-pay | Admitting: Family Medicine

## 2016-06-20 ENCOUNTER — Other Ambulatory Visit: Payer: Self-pay | Admitting: Family Medicine

## 2016-06-29 ENCOUNTER — Other Ambulatory Visit: Payer: Self-pay | Admitting: Family Medicine

## 2016-07-12 ENCOUNTER — Ambulatory Visit (INDEPENDENT_AMBULATORY_CARE_PROVIDER_SITE_OTHER): Payer: 59 | Admitting: Family Medicine

## 2016-07-12 ENCOUNTER — Encounter: Payer: Self-pay | Admitting: Family Medicine

## 2016-07-12 VITALS — BP 118/74 | HR 78 | Resp 16 | Ht 63.0 in | Wt 208.0 lb

## 2016-07-12 DIAGNOSIS — Z780 Asymptomatic menopausal state: Secondary | ICD-10-CM

## 2016-07-12 DIAGNOSIS — Z0000 Encounter for general adult medical examination without abnormal findings: Secondary | ICD-10-CM

## 2016-07-12 DIAGNOSIS — E785 Hyperlipidemia, unspecified: Secondary | ICD-10-CM

## 2016-07-12 DIAGNOSIS — Z1211 Encounter for screening for malignant neoplasm of colon: Secondary | ICD-10-CM

## 2016-07-12 DIAGNOSIS — E038 Other specified hypothyroidism: Secondary | ICD-10-CM

## 2016-07-12 DIAGNOSIS — E559 Vitamin D deficiency, unspecified: Secondary | ICD-10-CM

## 2016-07-12 DIAGNOSIS — I10 Essential (primary) hypertension: Secondary | ICD-10-CM

## 2016-07-12 DIAGNOSIS — R7302 Impaired glucose tolerance (oral): Secondary | ICD-10-CM

## 2016-07-12 NOTE — Patient Instructions (Signed)
f/u in 5 month, call if you need me before  Labs this week non fasting please  You are being referred for bone density testing  You need to start regular exercise and change diet so that you lose weight  Thank you  for choosing [facility]. We consider it a privelige to serve you.  Delivering excellent health care in a caring and  compassionate way is our goal.  Partnering with you,  so that together we can achieve this goal is our strategy.

## 2016-07-13 LAB — CBC
HCT: 38.3 % (ref 35.0–45.0)
HEMOGLOBIN: 12.3 g/dL (ref 11.7–15.5)
MCH: 27.5 pg (ref 27.0–33.0)
MCHC: 32.1 g/dL (ref 32.0–36.0)
MCV: 85.5 fL (ref 80.0–100.0)
MPV: 11.2 fL (ref 7.5–12.5)
Platelets: 326 10*3/uL (ref 140–400)
RBC: 4.48 MIL/uL (ref 3.80–5.10)
RDW: 15.2 % — ABNORMAL HIGH (ref 11.0–15.0)
WBC: 8.3 10*3/uL (ref 3.8–10.8)

## 2016-07-13 LAB — BASIC METABOLIC PANEL
BUN: 17 mg/dL (ref 7–25)
CALCIUM: 8.9 mg/dL (ref 8.6–10.4)
CO2: 23 mmol/L (ref 20–31)
Chloride: 109 mmol/L (ref 98–110)
Creat: 0.68 mg/dL (ref 0.50–0.99)
Glucose, Bld: 87 mg/dL (ref 65–99)
Potassium: 4.3 mmol/L (ref 3.5–5.3)
SODIUM: 142 mmol/L (ref 135–146)

## 2016-07-13 LAB — COMPLETE METABOLIC PANEL WITH GFR
ALT: 9 U/L (ref 6–29)
AST: 15 U/L (ref 10–35)
Albumin: 3.7 g/dL (ref 3.6–5.1)
Alkaline Phosphatase: 108 U/L (ref 33–130)
BILIRUBIN TOTAL: 0.5 mg/dL (ref 0.2–1.2)
BUN: 17 mg/dL (ref 7–25)
CO2: 23 mmol/L (ref 20–31)
CREATININE: 0.68 mg/dL (ref 0.50–0.99)
Calcium: 8.9 mg/dL (ref 8.6–10.4)
Chloride: 109 mmol/L (ref 98–110)
GFR, Est African American: >89 mL/min (ref >=60)
GLUCOSE: 87 mg/dL (ref 65–99)
Potassium: 4.3 mmol/L (ref 3.5–5.3)
SODIUM: 142 mmol/L (ref 135–146)
TOTAL PROTEIN: 6.3 g/dL (ref 6.1–8.1)

## 2016-07-13 LAB — LIPID PANEL
Cholesterol: 201 mg/dL — ABNORMAL HIGH (ref <200)
HDL: 73 mg/dL (ref >50)
LDL Cholesterol: 109 mg/dL — ABNORMAL HIGH (ref <100)
Total CHOL/HDL Ratio: 2.8 Ratio (ref <5.0)
Triglycerides: 93 mg/dL (ref <150)
VLDL: 19 mg/dL (ref <30)

## 2016-07-14 ENCOUNTER — Encounter: Payer: Self-pay | Admitting: Family Medicine

## 2016-07-14 LAB — HEMOGLOBIN A1C
HEMOGLOBIN A1C: 5.5 % (ref <5.7)
MEAN PLASMA GLUCOSE: 111 mg/dL

## 2016-07-14 LAB — VITAMIN D 25 HYDROXY (VIT D DEFICIENCY, FRACTURES): VIT D 25 HYDROXY: 32 ng/mL (ref 30–100)

## 2016-07-14 NOTE — Progress Notes (Signed)
L 

## 2016-07-17 ENCOUNTER — Encounter: Payer: Self-pay | Admitting: Family Medicine

## 2016-07-18 NOTE — Assessment & Plan Note (Signed)

## 2016-07-18 NOTE — Progress Notes (Signed)
    Victoria Reyes     MRN: [id number]      DOB: 19-Jul-1950  HPI: Patient is in for annual physical exam. No other health concerns are expressed or addressed at the visit. Recent labs, if available are reviewed. Immunization is reviewed , and  updated if needed.   PE: Pleasant  female, alert and oriented x 3, in no cardio-pulmonary distress. Afebrile. HEENT No facial trauma or asymetry. Sinuses non tender.  Extra occullar muscles intact, pupils equally reactive to light. External ears normal, tympanic membranes clear. Oropharynx moist, no exudate. Neck: supple, no adenopathy,JVD or thyromegaly.No bruits.  Chest: Clear to ascultation bilaterally.No crackles or wheezes. Non tender to palpation  Breast: No asymetry,no masses or lumps. No tenderness. No nipple discharge or inversion. No axillary or supraclavicular adenopathy  Cardiovascular system; Heart sounds normal,  S1 and  S2 ,no S3.  No murmur, or thrill. Apical beat not displaced Peripheral pulses normal.  Abdomen: Soft, non tender, no organomegaly or masses. No bruits. Bowel sounds normal. No guarding, tenderness or rebound.  Rectal:  Normal sphincter tone. No rectal mass. Guaiac negative stool.  GU: Not examines, asymptomatic  Musculoskeletal exam: Decreased though adequate  ROM of spine, normal in  hips ,  and knees.Decreased in left shoulder No deformity ,swelling or crepitus noted. No muscle wasting or atrophy.   Neurologic: Cranial nerves 2 to 12 intact. Power, tone ,sensation and reflexes normal throughout. No disturbance in gait. No tremor.  Skin: Intact, no ulceration, erythema , scaling or rash noted. Pigmentation normal throughout  Psych; Normal mood and affect. Judgement and concentration normal   Assessment & Plan:  Annual physical exam Annual exam as documented. Counseling done  re healthy lifestyle involving commitment to 150 minutes exercise per week, heart healthy diet, and  attaining healthy weight.The importance of adequate sleep also discussed. Regular seat belt use and home safety, is also discussed. Changes in health habits are decided on by the patient with goals and time frames  set for achieving them. Immunization and cancer screening needs are specifically addressed at this visit.

## 2016-07-19 LAB — POC HEMOCCULT BLD/STL (OFFICE/1-CARD/DIAGNOSTIC): Fecal Occult Blood, POC: NEGATIVE

## 2016-07-19 NOTE — Addendum Note (Signed)
Addended by: Eual Fines on: 07/19/2016 09:18 AM   Modules accepted: Orders

## 2016-08-16 ENCOUNTER — Ambulatory Visit: Payer: 59

## 2016-10-18 ENCOUNTER — Ambulatory Visit (INDEPENDENT_AMBULATORY_CARE_PROVIDER_SITE_OTHER): Payer: Medicare Other

## 2016-10-18 VITALS — BP 122/84 | HR 72 | Temp 98.4°F | Ht 63.0 in | Wt 210.0 lb

## 2016-10-18 DIAGNOSIS — Z0000 Encounter for general adult medical examination without abnormal findings: Secondary | ICD-10-CM | POA: Diagnosis not present

## 2016-10-18 NOTE — Progress Notes (Signed)
Subjective:   Victoria Reyes is a 66 y.o. female who presents for an Initial Medicare Annual Wellness Visit.  Review of Systems:  Cardiac Risk Factors include: advanced age (>59men, >38 women);dyslipidemia;hypertension;obesity (BMI >30kg/m2)     Objective:    Today's Vitals   10/18/16 1550  BP: 122/84  Pulse: 72  Temp: 98.4 F (36.9 C)  TempSrc: Oral  Weight: 210 lb 0.6 oz (95.3 kg)  Height: 5\' 3"  (1.6 m)   Body mass index is 37.21 kg/m.   Current Medications (verified) Outpatient Encounter Prescriptions as of 10/18/2016  Medication Sig  . amLODipine (NORVASC) 5 MG tablet TAKE 1 TABLET (5 MG TOTAL) BY MOUTH DAILY.  Marland Kitchen gabapentin (NEURONTIN) 300 MG capsule TAKE 2 CAPSULES (300 MG TOTAL) BY MOUTH AT BEDTIME. (Patient taking differently: Take 300 mg by mouth at bedtime as needed. TAKE 2 CAPSULES (300 MG TOTAL) BY MOUTH AT BEDTIME.)  . levothyroxine (SYNTHROID, LEVOTHROID) 100 MCG tablet TAKE 1 TABLET BY MOUTH EVERY DAY BEFORE BREAKFAST, EXCEPT SATURDAY AND SUNDAY ONLY TAKE 1/2 TABLET  . omeprazole (PRILOSEC) 40 MG capsule TAKE 1 CAPSULE BY MOUTH DAILY.  Marland Kitchen Vitamin D, Ergocalciferol, 2000 units CAPS Take 1 capsule by mouth daily.  . Vitamin D, Ergocalciferol, (DRISDOL) 50000 units CAPS capsule TAKE ONE CAPSULE BY MOUTH EVERY 7 DAYS (Patient not taking: Reported on 10/18/2016)   No facility-administered encounter medications on file as of 10/18/2016.     Allergies (verified) Hydrocodone and Sulfa antibiotics   History: Past Medical History:  Diagnosis Date  . Diabetes mellitus    bordlerline  . Diverticulitis 2005   CT   . DJD (degenerative joint disease)   . GERD (gastroesophageal reflux disease)   . Headache(784.0)   . Hiatal hernia   . Hyperlipidemia   . Hypothyroidism   . Knee pain, right   . Low back pain   . Neuropathy    peroneal nerve  . Obesity   . S/P colonoscopy 2005   Dr. Tamala Julian: sigmoid diverticulosis  . Thyroid disease   . Tobacco abuse    Past  Surgical History:  Procedure Laterality Date  . BACK SURGERY    . COLONOSCOPY  12/18/2010   Procedure: COLONOSCOPY;  Surgeon: Dorothyann Peng, MD;  Location: [facility];  Service: Endoscopy;  Laterality: N/A;  10:15am  . cyst removal right wrist    . TONSILLECTOMY    . TUBAL LIGATION    . umbillical hernia     Family History  Problem Relation Age of Onset  . Heart attack Sister        pacemaker  . Kidney failure Sister        on dialysis  . Colon cancer Neg Hx    Social History   Occupational History  . Not on file.   Social History Main Topics  . Smoking status: Former Smoker    Packs/day: 0.25    Years: 45.00    Types: Cigarettes    Start date: 09/30/1963    Quit date: 08/15/2015  . Smokeless tobacco: Never Used  . Alcohol use No  . Drug use: No  . Sexual activity: Not Currently    Birth control/ protection: Post-menopausal    Tobacco Counseling Counseling given: Not Answered   Activities of Daily Living In your present state of health, do you have any difficulty performing the following activities: 10/18/2016 05/03/2016  Hearing? N N  Vision? Y N  Difficulty concentrating or making decisions? N N  Walking or  climbing stairs? N N  Dressing or bathing? N N  Doing errands, shopping? N N  Preparing Food and eating ? N -  Using the Toilet? N -  In the past six months, have you accidently leaked urine? N -  Do you have problems with loss of bowel control? N -  Managing your Medications? N -  Managing your Finances? N -  Housekeeping or managing your Housekeeping? N -  Some recent data might be hidden    Immunizations and Health Maintenance There is no immunization history for the selected administration types on file for this patient. There are no preventive care reminders to display for this patient.  Patient Care Team: Fayrene Helper, MD as PCP - General (Family Medicine) Gala Romney, Cristopher Estimable, MD (Gastroenterology)  Indicate any recent Medical Services you  may have received from other than [facility] providers in the past year (date may be approximate).     Assessment:   This is a routine wellness examination for Victoria Reyes.  Hearing/Vision screen  Visual Acuity Screening   Right eye Left eye Both eyes  Without correction: 20/25 20/25 20/25   With correction:       Dietary issues and exercise activities discussed: Current Exercise Habits: Home exercise routine, Type of exercise: walking, Time (Minutes): 60, Frequency (Times/Week): 2, Weekly Exercise (Minutes/Week): 120, Intensity: Mild  Goals    . Increase water intake    . Reduce fat intake          Recommend decreasing the amount of fried/high fatty foods.       Depression Screen PHQ 2/9 Scores 10/18/2016 05/03/2016 07/09/2015 08/15/2013  PHQ - 2 Score 0 0 0 0  PHQ- 9 Score - - 1 0    Fall Risk Fall Risk  10/18/2016 05/03/2016 07/09/2015  Falls in the past year? Yes No Yes  Number falls in past yr: 2 or more - 1  Injury with Fall? No - No  Follow up Falls evaluation completed;Education provided;Falls prevention discussed - -    Cognitive Function: Normal  6CIT Screen 10/18/2016  What Year? 0 points  What month? 0 points  What time? 0 points  Count back from 20 0 points  Months in reverse 0 points  Repeat phrase 0 points  Total Score 0    Screening Tests Health Maintenance  Topic Date Due  . INFLUENZA VACCINE  02/24/2017 (Originally 11/10/2016)  . PNA vac Low Risk Adult (1 of 2 - PCV13) 02/25/2017 (Originally 10/06/2014)  . TETANUS/TDAP  03/04/2017 (Originally 10/05/1969)  . MAMMOGRAM  01/19/2018  . COLONOSCOPY  12/30/2020  . DEXA SCAN  Completed  . Hepatitis C Screening  Completed      Plan:   I have personally reviewed and noted the following in the patient's chart:   . Medical and social history . Use of alcohol, tobacco or illicit drugs  . Current medications and supplements . Functional ability and status . Nutritional status . Physical activity . Advanced  directives . List of other physicians . Hospitalizations, surgeries, and ER visits in previous 12 months . Vitals . Screenings to include cognitive, depression, and falls . Referrals and appointments  In addition, I have reviewed and discussed with patient certain preventive protocols, quality metrics, and best practice recommendations. A written personalized care plan for preventive services as well as general preventive health recommendations were provided to patient.     Stormy Fabian, LPN   07/19/6757  Lead Nurse Health Advisor

## 2016-10-18 NOTE — Patient Instructions (Addendum)
[name] , Thank you for taking time to come for your Medicare Wellness Visit. I appreciate your ongoing commitment to your health goals. Please review the following plan we discussed and let me know if I can assist you in the future.   Screening recommendations/referrals: Colonoscopy: Up to date, next due 12/2020 Mammogram: Up to date, next due 01/2017 Bone Density: Up to date Recommended yearly ophthalmology/optometry visit for glaucoma screening and checkup Recommended yearly dental visit for hygiene and checkup  Vaccinations: Influenza vaccine: Due 12/2016 Pneumococcal vaccine: Due, declined Tdap vaccine: Due, declined Shingles vaccine: Due, declined    Advanced directives: Advance directive discussed with you today. I have provided a copy for you to complete at home and have notarized. Once this is complete please bring a copy in to our office so we can scan it into your chart.  Conditions/risks identified: Obese, recommend starting a routine exercise program at least 3 days a week for 30-45 minutes at a time as tolerated. Also recommend limiting the amount of foods that are fried or high in fat.  Next appointment: Follow up with Dr. Moshe Cipro on 11/11/2016 at 8:20 am. Follow up in 1 year for your annual wellness visit.   Preventive Care 85 Years and Older, Female Preventive care refers to lifestyle choices and visits with your health care provider that can promote health and wellness. What does preventive care include?  A yearly physical exam. This is also called an annual well check.  Dental exams once or twice a year.  Routine eye exams. Ask your health care provider how often you should have your eyes checked.  Personal lifestyle choices, including:  Daily care of your teeth and gums.  Regular physical activity.  Eating a healthy diet.  Avoiding tobacco and drug use.  Limiting alcohol use.  Practicing safe sex.  Taking low-dose aspirin every day.  Taking  vitamin and mineral supplements as recommended by your health care provider. What happens during an annual well check? The services and screenings done by your health care provider during your annual well check will depend on your age, overall health, lifestyle risk factors, and family history of disease. Counseling  Your health care provider may ask you questions about your:  Alcohol use.  Tobacco use.  Drug use.  Emotional well-being.  Home and relationship well-being.  Sexual activity.  Eating habits.  History of falls.  Memory and ability to understand (cognition).  Work and work Statistician.  Reproductive health. Screening  You may have the following tests or measurements:  Height, weight, and BMI.  Blood pressure.  Lipid and cholesterol levels. These may be checked every 5 years, or more frequently if you are over 3 years old.  Skin check.  Lung cancer screening. You may have this screening every year starting at age 12 if you have a 30-pack-year history of smoking and currently smoke or have quit within the past 15 years.  Fecal occult blood test (FOBT) of the stool. You may have this test every year starting at age 38.  Flexible sigmoidoscopy or colonoscopy. You may have a sigmoidoscopy every 5 years or a colonoscopy every 10 years starting at age 28.  Hepatitis C blood test.  Hepatitis B blood test.  Sexually transmitted disease (STD) testing.  Diabetes screening. This is done by checking your blood sugar (glucose) after you have not eaten for a while (fasting). You may have this done every 1-3 years.  Bone density scan. This is done to screen for  osteoporosis. You may have this done starting at age 35.  Mammogram. This may be done every 1-2 years. Talk to your health care provider about how often you should have regular mammograms. Talk with your health care provider about your test results, treatment options, and if necessary, the need for more  tests. Vaccines  Your health care provider may recommend certain vaccines, such as:  Influenza vaccine. This is recommended every year.  Tetanus, diphtheria, and acellular pertussis (Tdap, Td) vaccine. You may need a Td booster every 10 years.  Zoster vaccine. You may need this after age 85.  Pneumococcal 13-valent conjugate (PCV13) vaccine. One dose is recommended after age 47.  Pneumococcal polysaccharide (PPSV23) vaccine. One dose is recommended after age 27. Talk to your health care provider about which screenings and vaccines you need and how often you need them. This information is not intended to replace advice given to you by your health care provider. Make sure you discuss any questions you have with your health care provider. Document Released: 04/25/2015 Document Revised: 12/17/2015 Document Reviewed: 01/28/2015 Elsevier Interactive Patient Education  2017 [place] Prevention in the Home Falls can cause injuries. They can happen to people of all ages. There are many things you can do to make your home safe and to help prevent falls. What can I do on the outside of my home?  Regularly fix the edges of walkways and driveways and fix any cracks.  Remove anything that might make you trip as you walk through a door, such as a raised step or threshold.  Trim any bushes or trees on the path to your home.  Use bright outdoor lighting.  Clear any walking paths of anything that might make someone trip, such as rocks or tools.  Regularly check to see if handrails are loose or broken. Make sure that both sides of any steps have handrails.  Any raised decks and porches should have guardrails on the edges.  Have any leaves, snow, or ice cleared regularly.  Use sand or salt on walking paths during winter.  Clean up any spills in your garage right away. This includes oil or grease spills. What can I do in the bathroom?  Use night lights.  Install grab bars by the  toilet and in the tub and shower. Do not use towel bars as grab bars.  Use non-skid mats or decals in the tub or shower.  If you need to sit down in the shower, use a plastic, non-slip stool.  Keep the floor dry. Clean up any water that spills on the floor as soon as it happens.  Remove soap buildup in the tub or shower regularly.  Attach bath mats securely with double-sided non-slip rug tape.  Do not have throw rugs and other things on the floor that can make you trip. What can I do in the bedroom?  Use night lights.  Make sure that you have a light by your bed that is easy to reach.  Do not use any sheets or blankets that are too big for your bed. They should not hang down onto the floor.  Have a firm chair that has side arms. You can use this for support while you get dressed.  Do not have throw rugs and other things on the floor that can make you trip. What can I do in the kitchen?  Clean up any spills right away.  Avoid walking on wet floors.  Keep items that you use  a lot in easy-to-reach places.  If you need to reach something above you, use a strong step stool that has a grab bar.  Keep electrical cords out of the way.  Do not use floor polish or wax that makes floors slippery. If you must use wax, use non-skid floor wax.  Do not have throw rugs and other things on the floor that can make you trip. What can I do with my stairs?  Do not leave any items on the stairs.  Make sure that there are handrails on both sides of the stairs and use them. Fix handrails that are broken or loose. Make sure that handrails are as long as the stairways.  Check any carpeting to make sure that it is firmly attached to the stairs. Fix any carpet that is loose or worn.  Avoid having throw rugs at the top or bottom of the stairs. If you do have throw rugs, attach them to the floor with carpet tape.  Make sure that you have a light switch at the top of the stairs and the bottom of  the stairs. If you do not have them, ask someone to add them for you. What else can I do to help prevent falls?  Wear shoes that:  Do not have high heels.  Have rubber bottoms.  Are comfortable and fit you well.  Are closed at the toe. Do not wear sandals.  If you use a stepladder:  Make sure that it is fully opened. Do not climb a closed stepladder.  Make sure that both sides of the stepladder are locked into place.  Ask someone to hold it for you, if possible.  Clearly mark and make sure that you can see:  Any grab bars or handrails.  First and last steps.  Where the edge of each step is.  Use tools that help you move around (mobility aids) if they are needed. These include:  Canes.  Walkers.  Scooters.  Crutches.  Turn on the lights when you go into a dark area. Replace any light bulbs as soon as they burn out.  Set up your furniture so you have a clear path. Avoid moving your furniture around.  If any of your floors are uneven, fix them.  If there are any pets around you, be aware of where they are.  Review your medicines with your doctor. Some medicines can make you feel dizzy. This can increase your chance of falling. Ask your doctor what other things that you can do to help prevent falls. This information is not intended to replace advice given to you by your health care provider. Make sure you discuss any questions you have with your health care provider. Document Released: 01/23/2009 Document Revised: 09/04/2015 Document Reviewed: 05/03/2014 Elsevier Interactive Patient Education  2017 [employer]  Fat and Cholesterol Restricted Diet Getting too much fat and cholesterol in your diet may cause health problems. Following this diet helps keep your fat and cholesterol at normal levels. This can keep you from getting sick. What types of fat should I choose?  Choose monosaturated and polyunsaturated fats. These are found in foods such as olive oil,  canola oil, flaxseeds, walnuts, almonds, and seeds.  Eat more omega-3 fats. Good choices include salmon, mackerel, sardines, tuna, flaxseed oil, and ground flaxseeds.  Limit saturated fats. These are in animal products such as meats, butter, and cream. They can also be in plant products such as palm oil, palm kernel oil, and coconut oil.  Avoid foods with partially hydrogenated oils in them. These contain trans fats. Examples of foods that have trans fats are stick margarine, some tub margarines, cookies, crackers, and other baked goods. What general guidelines do I need to follow?  Check food labels. Look for the words "trans fat" and "saturated fat."  When preparing a meal: ? Fill half of your plate with vegetables and green salads. ? Fill one fourth of your plate with whole grains. Look for the word "whole" as the first word in the ingredient list. ? Fill one fourth of your plate with lean protein foods.  Eat more foods that have fiber, like apples, carrots, beans, peas, and barley.  Eat more home-cooked foods. Eat less at restaurants and buffets.  Limit or avoid alcohol.  Limit foods high in starch and sugar.  Limit fried foods.  Cook foods without frying them. Baking, boiling, grilling, and broiling are all great options.  Lose weight if you are overweight. Losing even a small amount of weight can help your overall health. It can also help prevent diseases such as diabetes and heart disease. What foods can I eat? Grains Whole grains, such as whole wheat or whole grain breads, crackers, cereals, and pasta. Unsweetened oatmeal, bulgur, barley, quinoa, or brown rice. Corn or whole wheat flour tortillas. Vegetables Fresh or frozen vegetables (raw, steamed, roasted, or grilled). Green salads. Fruits All fresh, canned (in natural juice), or frozen fruits. Meat and Other Protein Products Ground beef (85% or leaner), grass-fed beef, or beef trimmed of fat. Skinless chicken or  [place]. Ground chicken or [place]. Pork trimmed of fat. All fish and seafood. Eggs. Dried beans, peas, or lentils. Unsalted nuts or seeds. Unsalted canned or dry beans. Dairy Low-fat dairy products, such as skim or 1% milk, 2% or reduced-fat cheeses, low-fat ricotta or cottage cheese, or plain low-fat yogurt. Fats and Oils Tub margarines without trans fats. Light or reduced-fat mayonnaise and salad dressings. Avocado. Olive, canola, sesame, or safflower oils. Natural peanut or almond butter (choose ones without added sugar and oil). The items listed above may not be a complete list of recommended foods or beverages. Contact your dietitian for more options. What foods are not recommended? Grains White bread. White pasta. White rice. Cornbread. Bagels, pastries, and croissants. Crackers that contain trans fat. Vegetables White potatoes. Corn. Creamed or fried vegetables. Vegetables in a cheese sauce. Fruits Dried fruits. Canned fruit in light or heavy syrup. Fruit juice. Meat and Other Protein Products Fatty cuts of meat. Ribs, chicken wings, bacon, sausage, bologna, salami, chitterlings, fatback, hot dogs, bratwurst, and packaged luncheon meats. Liver and organ meats. Dairy Whole or 2% milk, cream, half-and-half, and cream cheese. Whole milk cheeses. Whole-fat or sweetened yogurt. Full-fat cheeses. Nondairy creamers and whipped toppings. Processed cheese, cheese spreads, or cheese curds. Sweets and Desserts Corn syrup, sugars, honey, and molasses. Candy. Jam and jelly. Syrup. Sweetened cereals. Cookies, pies, cakes, donuts, muffins, and ice cream. Fats and Oils Butter, stick margarine, lard, shortening, ghee, or bacon fat. Coconut, palm kernel, or palm oils. Beverages Alcohol. Sweetened drinks (such as sodas, lemonade, and fruit drinks or punches). The items listed above may not be a complete list of foods and beverages to avoid. Contact your dietitian for more information. This information is  not intended to replace advice given to you by your health care provider. Make sure you discuss any questions you have with your health care provider. Document Released: 09/28/2011 Document Revised: 12/04/2015 Document Reviewed: 06/28/2013 Elsevier Interactive Patient Education  2018 [place].

## 2016-10-21 ENCOUNTER — Other Ambulatory Visit: Payer: Self-pay | Admitting: Orthopaedic Surgery

## 2016-10-21 ENCOUNTER — Telehealth: Payer: Self-pay | Admitting: Orthopaedic Surgery

## 2016-10-21 ENCOUNTER — Other Ambulatory Visit: Payer: Self-pay | Admitting: Radiology

## 2016-10-21 DIAGNOSIS — M25512 Pain in left shoulder: Principal | ICD-10-CM

## 2016-10-21 DIAGNOSIS — G8929 Other chronic pain: Secondary | ICD-10-CM

## 2016-10-21 NOTE — Telephone Encounter (Signed)
Patient called regarding the matter of surgery, which was discussed at last office visit in January of 2018.  At that time, patient was not ready to move forward with surgery; therefore, did not wish to schedule at that time with Dr Aline Brochure.  States  "total shoulder replacement" was recommended, and is aware that Dr Aline Brochure does not perform this procedure.  Requests now to be referred to shoulder specialist in [facility] who does shoulder replacement surgery.  Please advise. Patient [phone]

## 2016-10-21 NOTE — Telephone Encounter (Signed)
Per Dr. Luna Glasgow, I will send the order to [facility].

## 2016-10-29 DIAGNOSIS — M5432 Sciatica, left side: Secondary | ICD-10-CM | POA: Diagnosis not present

## 2016-10-29 DIAGNOSIS — M961 Postlaminectomy syndrome, not elsewhere classified: Secondary | ICD-10-CM | POA: Diagnosis not present

## 2016-10-31 ENCOUNTER — Other Ambulatory Visit: Payer: Self-pay | Admitting: Family Medicine

## 2016-11-01 ENCOUNTER — Ambulatory Visit (INDEPENDENT_AMBULATORY_CARE_PROVIDER_SITE_OTHER): Payer: Medicare Other | Admitting: Orthopedic Surgery

## 2016-11-01 ENCOUNTER — Encounter (INDEPENDENT_AMBULATORY_CARE_PROVIDER_SITE_OTHER): Payer: Self-pay | Admitting: Orthopedic Surgery

## 2016-11-01 DIAGNOSIS — M25512 Pain in left shoulder: Secondary | ICD-10-CM

## 2016-11-01 DIAGNOSIS — G8929 Other chronic pain: Secondary | ICD-10-CM

## 2016-11-01 NOTE — Progress Notes (Signed)
Office Visit Note   Patient: Victoria Reyes           Date of Birth: 11/27/1950           MRN: [id number] Visit Date: 11/01/2016 Requested by: Fayrene Helper, [place], [place], [place] [address] PCP: Fayrene Helper, MD  Subjective: Chief Complaint  Patient presents with  . Left Shoulder - Pain    HPI: Victoria Reyes is a 66 year old patient with left shoulder pain.  She's had an MRI scan which is reviewed.  Shows partial-thickness bursal sided supraspinatus tear which is small along with early arthritis in the glenohumeral joint.  Patient describes pain for over a year.  She has had an injection which did not give her sustained relief.  She takes Aleve for her symptoms.  She also takes gabapentin for her symptoms.  In discussing with her her daily activities it seems as if she is a fairly low demand patient in terms of putting stress on her shoulders.  She does have a hard time dressing.  It does hurt for her to lay on that left side.  She has had physical therapy which has not helped.  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  Her daughter is with her today.              ROS: All systems reviewed are negative as they relate to the chief complaint within the history of present illness.  Patient denies  fevers or chills.   Assessment & Plan: Visit Diagnoses:  1. Chronic left shoulder pain     Plan: Impression is arthritic left shoulder pain and the patient has reasonably well maintained strength and range of motion.  She has partial-thickness rotator cuff tear on the left.  She is here to discuss shoulder replacement.  Discussed shoulder replacement at length with Lira and her daughter.  The risks and benefits could not limited to infection or vessel damage the actual procedure itself is described along with the use of either CPM more specialized abduction splint postoperatively in order to achieve range of motion.  Patient understands the risks and  benefits of surgery.  Wishes to consider her options.  I gave her a copy of my surgery scheduler's card in case she wants to proceed.  All questions answered.  I  Follow-Up Instructions: Return if symptoms worsen or fail to improve.   Orders:  No orders of the defined types were placed in this encounter.  No orders of the defined types were placed in this encounter.     Procedures: No procedures performed   Clinical Data: No additional findings.  Objective: Vital Signs: There were no vitals taken for this visit.  Physical Exam:   Constitutional: Patient appears well-developed HEENT:  Head: Normocephalic Eyes:EOM are normal Neck: Normal range of motion Cardiovascular: Normal rate Pulmonary/chest: Effort normal Neurologic: Patient is alert Skin: Skin is warm Psychiatric: Patient has normal mood and affect     Ortho Exam: Orthopedic exam demonstrates good cervical spine range of motion 5 out of 5 grip EPL FPL interosseous wrist flexion-extension biceps triceps and upper strength.  Does have a little bit of coarseness with passive range of motion of the left shoulder compared to the right rotator cuff strength is intact to infraspinatus super space and subscap muscle testing.  No real restriction of passive range of motion left versus right.  At the extremes of motion patient does  have some pain on the left-hand side.  No acromioclavicular joint tenderness.  Specialty Comments:  No specialty comments available.  Imaging: No results found.   PMFS History: Patient Active Problem List   Diagnosis Date Noted  . Neck pain on left side 01/20/2016  . Shoulder pain, left 11/26/2015  . Annual physical exam 07/10/2015  . Cutaneous wart 09/30/2014  . HTN (hypertension) 01/01/2013  . Hyperlipidemia with target LDL less than 100 05/04/2012  . IGT (impaired glucose tolerance) 03/14/2011  . GERD (gastroesophageal reflux disease) 03/14/2011  . BACK PAIN, LUMBAR, CHRONIC  06/25/2009  . Obesity 02/17/2007  . Hypothyroidism 06/14/2006  . DIVERTICULOSIS, COLON 03/01/2006  . HIATAL HERNIA WITH REFLUX, HX OF 03/01/2006   Past Medical History:  Diagnosis Date  . Diabetes mellitus    bordlerline  . Diverticulitis 2005   CT   . DJD (degenerative joint disease)   . GERD (gastroesophageal reflux disease)   . Headache(784.0)   . Hiatal hernia   . Hyperlipidemia   . Hypothyroidism   . Knee pain, right   . Low back pain   . Neuropathy    peroneal nerve  . Obesity   . S/P colonoscopy 2005   Dr. Tamala Julian: sigmoid diverticulosis  . Thyroid disease   . Tobacco abuse     Family History  Problem Relation Age of Onset  . Heart attack Sister        pacemaker  . Kidney failure Sister        on dialysis  . Colon cancer Neg Hx     Past Surgical History:  Procedure Laterality Date  . BACK SURGERY    . COLONOSCOPY  12/18/2010   Procedure: COLONOSCOPY;  Surgeon: Dorothyann Peng, MD;  Location: [facility];  Service: Endoscopy;  Laterality: N/A;  10:15am  . cyst removal right wrist    . TONSILLECTOMY    . TUBAL LIGATION    . umbillical hernia     Social History   Occupational History  . Not on file.   Social History Main Topics  . Smoking status: Former Smoker    Packs/day: 0.25    Years: 45.00    Types: Cigarettes    Start date: 09/30/1963    Quit date: 08/15/2015  . Smokeless tobacco: Never Used  . Alcohol use No  . Drug use: No  . Sexual activity: Not Currently    Birth control/ protection: Post-menopausal

## 2016-11-03 ENCOUNTER — Telehealth (INDEPENDENT_AMBULATORY_CARE_PROVIDER_SITE_OTHER): Payer: Self-pay | Admitting: Orthopedic Surgery

## 2016-11-03 DIAGNOSIS — M19012 Primary osteoarthritis, left shoulder: Secondary | ICD-10-CM

## 2016-11-03 NOTE — Telephone Encounter (Signed)
Patient called and LVM stating she would like to schedule surgery. Please write blue sheet. Thank you so much!

## 2016-11-04 ENCOUNTER — Other Ambulatory Visit (HOSPITAL_COMMUNITY): Payer: Self-pay | Admitting: Orthopedic Surgery

## 2016-11-04 DIAGNOSIS — M961 Postlaminectomy syndrome, not elsewhere classified: Secondary | ICD-10-CM

## 2016-11-04 NOTE — Telephone Encounter (Signed)
Blue sheet done.  Please call Josh.  See if the MRI scan that she has is sufficient for patient's specific instrumentation.  We will need to template for both reverse and total shoulder.

## 2016-11-04 NOTE — Telephone Encounter (Signed)
Spoke with Merrily Pew and he stated this pt needs a new scan with the Biomet scan. Please put in order for scan. Thanks!

## 2016-11-04 NOTE — Telephone Encounter (Signed)
Can you ask if new ct or mr is required

## 2016-11-05 NOTE — Telephone Encounter (Signed)
The pt needs the Biomet protocol CT scan

## 2016-11-05 NOTE — Telephone Encounter (Signed)
Can you please ask if will need CT or MRI so we can order? Thank you.

## 2016-11-05 NOTE — Telephone Encounter (Signed)
Emailed Josh, waiting on reply.

## 2016-11-08 NOTE — Telephone Encounter (Signed)
Order submitted

## 2016-11-10 DIAGNOSIS — H2513 Age-related nuclear cataract, bilateral: Secondary | ICD-10-CM | POA: Diagnosis not present

## 2016-11-11 ENCOUNTER — Ambulatory Visit: Payer: 59 | Admitting: Family Medicine

## 2016-11-11 ENCOUNTER — Telehealth: Payer: Self-pay | Admitting: Family Medicine

## 2016-11-11 DIAGNOSIS — H25811 Combined forms of age-related cataract, right eye: Secondary | ICD-10-CM | POA: Diagnosis not present

## 2016-11-11 DIAGNOSIS — H25812 Combined forms of age-related cataract, left eye: Secondary | ICD-10-CM | POA: Diagnosis not present

## 2016-11-11 NOTE — Telephone Encounter (Signed)
noted 

## 2016-11-11 NOTE — Telephone Encounter (Signed)
Patient had 8:20 appt with Dr Moshe Cipro this morning and cancelled her appt.  She left message on voice mail at 4:51 yesterday stating she was having a problem with her eyes and needed to go to [place] and she would call later to RS her appt.

## 2016-11-12 ENCOUNTER — Ambulatory Visit (HOSPITAL_COMMUNITY)
Admission: RE | Admit: 2016-11-12 | Discharge: 2016-11-12 | Disposition: A | Discharge status: Home / Self Care | Payer: Medicare Other | Source: Ambulatory Visit | Attending: Orthopedic Surgery | Admitting: Orthopedic Surgery

## 2016-11-12 DIAGNOSIS — M961 Postlaminectomy syndrome, not elsewhere classified: Secondary | ICD-10-CM | POA: Diagnosis not present

## 2016-11-12 DIAGNOSIS — M5136 Other intervertebral disc degeneration, lumbar region: Secondary | ICD-10-CM | POA: Insufficient documentation

## 2016-11-12 DIAGNOSIS — M545 Low back pain: Secondary | ICD-10-CM | POA: Diagnosis not present

## 2016-11-15 ENCOUNTER — Telehealth (INDEPENDENT_AMBULATORY_CARE_PROVIDER_SITE_OTHER): Payer: Self-pay | Admitting: Orthopedic Surgery

## 2016-11-15 NOTE — Telephone Encounter (Signed)
8/6: [place] called and  s/w pt & dtr, they weren't aware this exam was being ordered.  Pt daughter doesn't know why CT is being done. Please call

## 2016-11-15 NOTE — Telephone Encounter (Signed)
Pt stated someone from our office called to schedule her CT scan this morning. She thought with her insurance she couldn't have it completed, but is now ready to schedule an appt for her shoulder CT. Please call pt.

## 2016-11-16 NOTE — Telephone Encounter (Signed)
I called s/w patient and explained to her reasoning for scan. She was ok with this and wished to proceed. But wanted to make sure that her insurance would cover the scan.

## 2016-11-17 NOTE — Telephone Encounter (Signed)
Pt has Medicare and [employer], no precertification is required.

## 2016-11-17 NOTE — Telephone Encounter (Signed)
Called and sw Arena and she will contact pt to get scheduled, is aware needs slices and to send CD with pt or by courier

## 2016-11-22 DIAGNOSIS — H25811 Combined forms of age-related cataract, right eye: Secondary | ICD-10-CM | POA: Diagnosis not present

## 2016-11-22 DIAGNOSIS — H2511 Age-related nuclear cataract, right eye: Secondary | ICD-10-CM | POA: Diagnosis not present

## 2016-11-29 ENCOUNTER — Ambulatory Visit
Admission: RE | Admit: 2016-11-29 | Discharge: 2016-11-29 | Disposition: A | Discharge status: Home / Self Care | Payer: Medicare Other | Source: Ambulatory Visit | Attending: Orthopedic Surgery | Admitting: Orthopedic Surgery

## 2016-11-29 DIAGNOSIS — M19012 Primary osteoarthritis, left shoulder: Secondary | ICD-10-CM

## 2016-12-01 DIAGNOSIS — M961 Postlaminectomy syndrome, not elsewhere classified: Secondary | ICD-10-CM | POA: Diagnosis not present

## 2016-12-01 DIAGNOSIS — M5432 Sciatica, left side: Secondary | ICD-10-CM | POA: Diagnosis not present

## 2016-12-07 ENCOUNTER — Telehealth (INDEPENDENT_AMBULATORY_CARE_PROVIDER_SITE_OTHER): Payer: Self-pay | Admitting: Orthopedic Surgery

## 2016-12-07 NOTE — Telephone Encounter (Signed)
LVM with pt to please call to schedule surgery. Will try pt again at a later time. 

## 2016-12-08 ENCOUNTER — Encounter (INDEPENDENT_AMBULATORY_CARE_PROVIDER_SITE_OTHER): Payer: Self-pay | Admitting: Orthopedic Surgery

## 2016-12-08 ENCOUNTER — Other Ambulatory Visit (INDEPENDENT_AMBULATORY_CARE_PROVIDER_SITE_OTHER): Payer: Self-pay | Admitting: Orthopedic Surgery

## 2016-12-08 DIAGNOSIS — M19012 Primary osteoarthritis, left shoulder: Secondary | ICD-10-CM

## 2016-12-09 DIAGNOSIS — H2512 Age-related nuclear cataract, left eye: Secondary | ICD-10-CM | POA: Diagnosis not present

## 2016-12-09 DIAGNOSIS — H25812 Combined forms of age-related cataract, left eye: Secondary | ICD-10-CM | POA: Diagnosis not present

## 2016-12-13 DIAGNOSIS — Z8261 Family history of arthritis: Secondary | ICD-10-CM

## 2016-12-15 ENCOUNTER — Ambulatory Visit (HOSPITAL_COMMUNITY)
Admission: RE | Admit: 2016-12-15 | Discharge: 2016-12-15 | Disposition: A | Discharge status: Home / Self Care | Payer: Medicare Other | Source: Ambulatory Visit | Attending: Orthopedic Surgery | Admitting: Orthopedic Surgery

## 2016-12-15 ENCOUNTER — Encounter (HOSPITAL_COMMUNITY)
Admission: RE | Admit: 2016-12-15 | Discharge: 2016-12-15 | Disposition: A | Discharge status: Home / Self Care | Payer: Medicare Other | Source: Ambulatory Visit | Attending: Orthopedic Surgery | Admitting: Orthopedic Surgery

## 2016-12-15 ENCOUNTER — Other Ambulatory Visit (HOSPITAL_COMMUNITY): Payer: Self-pay | Admitting: *Deleted

## 2016-12-15 ENCOUNTER — Encounter (HOSPITAL_COMMUNITY): Payer: Self-pay

## 2016-12-15 DIAGNOSIS — Z01810 Encounter for preprocedural cardiovascular examination: Secondary | ICD-10-CM | POA: Diagnosis not present

## 2016-12-15 DIAGNOSIS — Z01818 Encounter for other preprocedural examination: Secondary | ICD-10-CM | POA: Insufficient documentation

## 2016-12-15 DIAGNOSIS — Z419 Encounter for procedure for purposes other than remedying health state, unspecified: Secondary | ICD-10-CM | POA: Insufficient documentation

## 2016-12-15 DIAGNOSIS — R9431 Abnormal electrocardiogram [ECG] [EKG]: Secondary | ICD-10-CM | POA: Diagnosis not present

## 2016-12-15 DIAGNOSIS — Z01812 Encounter for preprocedural laboratory examination: Secondary | ICD-10-CM | POA: Insufficient documentation

## 2016-12-15 DIAGNOSIS — J40 Bronchitis, not specified as acute or chronic: Secondary | ICD-10-CM | POA: Diagnosis not present

## 2016-12-15 HISTORY — DX: Pneumonia, unspecified organism: J18.9

## 2016-12-15 HISTORY — DX: Anemia, unspecified: D64.9

## 2016-12-15 HISTORY — DX: Prediabetes: R73.03

## 2016-12-15 HISTORY — DX: Essential (primary) hypertension: I10

## 2016-12-15 LAB — URINALYSIS, COMPLETE (UACMP) WITH MICROSCOPIC
BILIRUBIN URINE: NEGATIVE
Glucose, UA: NEGATIVE mg/dL
Hgb urine dipstick: NEGATIVE
KETONES UR: NEGATIVE mg/dL
Leukocytes, UA: NEGATIVE
NITRITE: NEGATIVE
PH: 5 (ref 5.0–8.0)
PROTEIN: NEGATIVE mg/dL
Specific Gravity, Urine: 1.018 (ref 1.005–1.030)

## 2016-12-15 LAB — CBC
HCT: 39.5 % (ref 36.0–46.0)
HEMOGLOBIN: 12.8 g/dL (ref 12.0–15.0)
MCH: 27.5 pg (ref 26.0–34.0)
MCHC: 32.4 g/dL (ref 30.0–36.0)
MCV: 84.9 fL (ref 78.0–100.0)
Platelets: 258 10*3/uL (ref 150–400)
RBC: 4.65 MIL/uL (ref 3.87–5.11)
RDW: 14.7 % (ref 11.5–15.5)
WBC: 10.4 10*3/uL (ref 4.0–10.5)

## 2016-12-15 LAB — BASIC METABOLIC PANEL
ANION GAP: 10 (ref 5–15)
BUN: 13 mg/dL (ref 6–20)
CALCIUM: 8.8 mg/dL — AB (ref 8.9–10.3)
CO2: 21 mmol/L — ABNORMAL LOW (ref 22–32)
Chloride: 109 mmol/L (ref 101–111)
Creatinine, Ser: 0.79 mg/dL (ref 0.44–1.00)
GFR calc non Af Amer: >60 mL/min (ref >60)
GLUCOSE: 84 mg/dL (ref 65–99)
Potassium: 4.5 mmol/L (ref 3.5–5.1)
SODIUM: 140 mmol/L (ref 135–145)

## 2016-12-15 LAB — HEMOGLOBIN A1C
Hgb A1c MFr Bld: 5.6 % (ref 4.8–5.6)
MEAN PLASMA GLUCOSE: 114.02 mg/dL

## 2016-12-15 LAB — SURGICAL PCR SCREEN
MRSA, PCR: NEGATIVE
STAPHYLOCOCCUS AUREUS: NEGATIVE

## 2016-12-15 NOTE — Progress Notes (Addendum)
Pt denies cardiac history, chest pain or sob. EKG was abnormal, will send to [place], [place] to review. Pt states she is pre-diabetic. Does not check her blood sugar at home. Last A1C 5.5 in April, 2018.

## 2016-12-15 NOTE — Pre-Procedure Instructions (Signed)
Victoria Reyes  12/15/2016    Your procedure is scheduled on Tuesday, December 21, 2016 at 10:55 AM.   Report to [facility] Entrance "A" Admitting Office at 8:55 AM.   Call this number if you have problems the morning of surgery: [phone]   Questions prior to day of surgery, please call [phone] between 8 & 4 PM.   Remember:  Do not eat food or drink liquids after midnight Monday, 12/20/16.  Take these medicines the morning of surgery with A SIP OF WATER: Amlodipine (Norvasc), Levothyroxine (Synthroid), Omeprazole (Prilosec)  Do not use Aspirin products, NSAIDS (Ibuprofen, Aleve, etc) or Herbal medications prior to surgery.   Do not wear jewelry, make-up or nail polish.  Do not wear lotions, powders, perfumes or deodorant.  Do not shave 48 hours prior to surgery.    Do not bring valuables to the hospital.  [facility] is not responsible for any belongings or valuables.  Contacts, dentures or bridgework may not be worn into surgery.  Leave your suitcase in the car.  After surgery it may be brought to your room.  For patients admitted to the hospital, discharge time will be determined by your treatment team.  [facility] - Preparing for Surgery  Before surgery, you can play an important role.  Because skin is not sterile, your skin needs to be as free of germs as possible.  You can reduce the number of germs on you skin by washing with CHG (chlorahexidine gluconate) soap before surgery.  CHG is an antiseptic cleaner which kills germs and bonds with the skin to continue killing germs even after washing.  Please DO NOT use if you have an allergy to CHG or antibacterial soaps.  If your skin becomes reddened/irritated stop using the CHG and inform your nurse when you arrive at Short Stay.  Do not shave (including legs and underarms) for at least 48 hours prior to the first CHG shower.  You may shave your face.  Please follow these instructions carefully:   1.   Shower with CHG Soap the night before surgery and the                    morning of Surgery.  2.  If you choose to wash your hair, wash your hair first as usual with your       normal shampoo.  3.  After you shampoo, rinse your hair and body thoroughly to remove the shampoo.  4.  Use CHG as you would any other liquid soap.  You can apply chg directly       to the skin and wash gently with scrungie or a clean washcloth.  5.  Apply the CHG Soap to your body ONLY FROM THE NECK DOWN.        Do not use on open wounds or open sores.  Avoid contact with your eyes, ears, mouth and genitals (private parts).  Wash genitals (private parts) with your normal soap.  6.  Wash thoroughly, paying special attention to the area where your surgery        will be performed.  7.  Thoroughly rinse your body with warm water from the neck down.  8.  DO NOT shower/wash with your normal soap after using and rinsing off       the CHG Soap.  9.  Pat yourself dry with a clean towel.            10.  Wear clean pajamas.            11.  Place clean sheets on your bed the night of your first shower and do not        sleep with pets.  Day of Surgery  Do not apply any lotions/deodorants the morning of surgery.  Please wear clean clothes to the hospital.   Please read over the fact sheets that you were given.

## 2016-12-16 ENCOUNTER — Other Ambulatory Visit (INDEPENDENT_AMBULATORY_CARE_PROVIDER_SITE_OTHER): Payer: Self-pay

## 2016-12-16 MED ORDER — CIPROFLOXACIN HCL 500 MG PO TABS: ORAL_TABLET | ORAL | 0 refills | Status: DC

## 2016-12-16 NOTE — Progress Notes (Signed)
Please call patient with cipro 500 po q d for 3 days

## 2016-12-16 NOTE — Progress Notes (Signed)
Anesthesia Chart Review:  Pt is a 66 year old female scheduled for L total shoulder arthroplasty on 12/21/2016 with Meredith Pel, MD  - PCP is Tula Nakayama, MD. Last office visit 07/12/16 for annual well visit.   PMH includes:  HTN, pre-diabetes, hyperlipidemia, hypothyroidism, anemia.  Former smoker. BMI 39  Medications include: Amlodipine, levothyroxine, Prilosec  Preoperative labs reviewed.   - HgA1c 5.6, glucose 84 - Urine culture with significant bacterial growth  CXR 12/15/16: Mild bronchitic-smoking related changes, stable. There is no acute cardiopulmonary abnormality.  EKG 12/15/16: Sinus rhythm with marked sinus arrhythmia. ST & T wave abnormality, consider anterior ischemia. These findings not present on 09/11/07 EKG but are found on EKG 05/25/05.    - Pt admitted for eval chest pain 2004.  Note by cardiologist Jenell Milliner, MD dated 01/07/03 documents "Her EKG showed T-wave inversion anterior and lateral leads. There was no evidence of PR depression or ST segment changes otherwise." Echo was done 01/07/03 that was normal.   Reviewed with Dr. Therisa Doyne. No CV sx documented at PAT.   If no changes, I anticipate pt can proceed with surgery as scheduled.   Willeen Cass, FNP-BC [facility]/Anesthesiology Phone: [phone] 12/16/2016 1:15 PM

## 2016-12-17 ENCOUNTER — Other Ambulatory Visit (INDEPENDENT_AMBULATORY_CARE_PROVIDER_SITE_OTHER): Payer: Self-pay

## 2016-12-17 LAB — URINE CULTURE

## 2016-12-17 MED ORDER — NITROFURANTOIN MONOHYD MACRO 100 MG PO CAPS: 100.0000 mg | ORAL_CAPSULE | Freq: Two times a day (BID) | ORAL | 0 refills | Status: DC

## 2016-12-17 NOTE — Progress Notes (Signed)
Needs macrobid 100 mg po bid until surgery thx

## 2016-12-19 NOTE — H&P (Signed)
Victoria Reyes is an 66 y.o. female.   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  She describes over a year of significant left shoulder pain.  Denies any weakness but just reports pain at night and pain with any type of range of motion.  She has had an MRI scan which demonstrates osteoarthritis of the glenohumeral joint.  Rotator cuff generally appears to be intact.  There is mild partial thickness tearing of the supraspinatus.  She has not had an injury to the left shoulder.  The patient describes pain that wakes her up from sleep at night.  It also interferes with her activities of daily living.  Past Medical History:  Diagnosis Date  . Anemia    as a  young woman  . Diverticulitis 2005   CT   . DJD (degenerative joint disease)   . GERD (gastroesophageal reflux disease)   . Headache(784.0)   . Hiatal hernia   . Hyperlipidemia   . Hypertension   . Hypothyroidism   . Knee pain, right   . Low back pain   . Neuropathy    peroneal nerve  . Obesity   . Pneumonia    as child  . Pre-diabetes   . S/P colonoscopy 2005   Dr. Tamala Julian: sigmoid diverticulosis  . Thyroid disease   . Tobacco abuse     Past Surgical History:  Procedure Laterality Date  . BACK SURGERY    . COLONOSCOPY  12/18/2010   Procedure: COLONOSCOPY;  Surgeon: Dorothyann Peng, MD;  Location: [facility];  Service: Endoscopy;  Laterality: N/A;  10:15am  . cyst removal right wrist    . EYE SURGERY Bilateral    cataract surgery with lens implant  . TONSILLECTOMY    . TUBAL LIGATION    . umbillical hernia      Family History  Problem Relation Age of Onset  . Heart attack Sister        pacemaker  . Kidney failure Sister        on dialysis  . Colon cancer Neg Hx    Social History:  reports that she quit smoking about 16 months ago. Her smoking use included Cigarettes. She started smoking about 53 years ago. She has a 11.25 pack-year smoking history.  She has never used smokeless tobacco. She reports that she does not drink alcohol or use drugs.  Allergies:  Allergies  Allergen Reactions  . Hydrocodone     REACTION: Nausea  . Sulfa Antibiotics Itching    No prescriptions prior to admission.    No results found for this or any previous visit (from the past 48 hour(s)). No results found.  Review of Systems  Musculoskeletal: Positive for joint pain.  All other systems reviewed and are negative.   There were no vitals taken for this visit. Physical Exam  Constitutional: She appears well-developed.  HENT:  Head: Normocephalic.  Eyes: Pupils are equal, round, and reactive to light.  Neck: Normal range of motion.  Cardiovascular: Normal rate.   Respiratory: Effort normal.  Neurological: She is alert.  Skin: Skin is warm.  Psychiatric: She has a normal mood and affect.   left shoulder demonstrates good rotator cuff strength isolated infraspinatus supraspinatus and subscap muscle testing.  There is some coarse grinding and crepitus with internal/external rotation of the arm.  External rotation is to 45 on the left.  The patient has forward flexion and abduction  passively to over 90.  No acromioclavicular joint tenderness is noted.  No other masses lymph adenopathy or skin changes noted in the left shoulder region  Assessment/Plan Impression is left shoulder symptomatic arthritis.  The arthritis looks mild to moderate on MRI scan.  There is an inferior humeral head osteophyte.  Rotator cuff looks to be intact both clinically and on MRI exam.  Plan at this time is anatomic shoulder replacement.  Risk and benefits are discussed including not limited to infection or vessel damage shoulder instability.  Her rotator cuff is functional enough that I don't think reverse shoulder replacement is indicated at this time.  All questions answered.  Anderson Malta, MD 12/19/2016, 8:49 AM

## 2016-12-21 ENCOUNTER — Encounter (HOSPITAL_COMMUNITY)
Admission: RE | Disposition: A | Discharge status: Home / Self Care | Payer: Self-pay | Source: Ambulatory Visit | Attending: Orthopedic Surgery

## 2016-12-21 ENCOUNTER — Inpatient Hospital Stay (HOSPITAL_COMMUNITY): Payer: Medicare Other | Admitting: Anesthesiology

## 2016-12-21 ENCOUNTER — Inpatient Hospital Stay (HOSPITAL_COMMUNITY): Payer: Medicare Other | Admitting: Vascular Surgery

## 2016-12-21 ENCOUNTER — Inpatient Hospital Stay (HOSPITAL_COMMUNITY): Payer: Medicare Other

## 2016-12-21 ENCOUNTER — Other Ambulatory Visit: Payer: Self-pay | Admitting: Family Medicine

## 2016-12-21 ENCOUNTER — Encounter (HOSPITAL_COMMUNITY): Payer: Self-pay | Admitting: Anesthesiology

## 2016-12-21 ENCOUNTER — Inpatient Hospital Stay (HOSPITAL_COMMUNITY)
Admission: RE | Admit: 2016-12-21 | Discharge: 2016-12-23 | DRG: 483 | Disposition: A | Discharge status: Home / Self Care | Payer: Medicare Other | Source: Ambulatory Visit | Attending: Orthopedic Surgery | Admitting: Orthopedic Surgery

## 2016-12-21 ENCOUNTER — Ambulatory Visit: Payer: Medicare Other | Admitting: Family Medicine

## 2016-12-21 DIAGNOSIS — R7303 Prediabetes: Secondary | ICD-10-CM | POA: Diagnosis present

## 2016-12-21 DIAGNOSIS — Z471 Aftercare following joint replacement surgery: Secondary | ICD-10-CM | POA: Diagnosis not present

## 2016-12-21 DIAGNOSIS — Z885 Allergy status to narcotic agent status: Secondary | ICD-10-CM

## 2016-12-21 DIAGNOSIS — K5730 Diverticulosis of large intestine without perforation or abscess without bleeding: Secondary | ICD-10-CM | POA: Diagnosis present

## 2016-12-21 DIAGNOSIS — Z87891 Personal history of nicotine dependence: Secondary | ICD-10-CM | POA: Diagnosis not present

## 2016-12-21 DIAGNOSIS — Z882 Allergy status to sulfonamides status: Secondary | ICD-10-CM | POA: Diagnosis not present

## 2016-12-21 DIAGNOSIS — K449 Diaphragmatic hernia without obstruction or gangrene: Secondary | ICD-10-CM | POA: Diagnosis present

## 2016-12-21 DIAGNOSIS — Z96612 Presence of left artificial shoulder joint: Secondary | ICD-10-CM | POA: Diagnosis not present

## 2016-12-21 DIAGNOSIS — E785 Hyperlipidemia, unspecified: Secondary | ICD-10-CM | POA: Diagnosis not present

## 2016-12-21 DIAGNOSIS — I10 Essential (primary) hypertension: Secondary | ICD-10-CM | POA: Diagnosis present

## 2016-12-21 DIAGNOSIS — K219 Gastro-esophageal reflux disease without esophagitis: Secondary | ICD-10-CM | POA: Diagnosis present

## 2016-12-21 DIAGNOSIS — M19012 Primary osteoarthritis, left shoulder: Secondary | ICD-10-CM | POA: Diagnosis not present

## 2016-12-21 DIAGNOSIS — E039 Hypothyroidism, unspecified: Secondary | ICD-10-CM | POA: Diagnosis present

## 2016-12-21 DIAGNOSIS — M19019 Primary osteoarthritis, unspecified shoulder: Secondary | ICD-10-CM | POA: Diagnosis present

## 2016-12-21 DIAGNOSIS — Z6838 Body mass index (BMI) 38.0-38.9, adult: Secondary | ICD-10-CM

## 2016-12-21 HISTORY — PX: TOTAL SHOULDER ARTHROPLASTY: SHX126

## 2016-12-21 LAB — GLUCOSE, CAPILLARY: GLUCOSE-CAPILLARY: 86 mg/dL (ref 65–99)

## 2016-12-21 SURGERY — ARTHROPLASTY, SHOULDER, TOTAL
Anesthesia: General | Site: Shoulder | Laterality: Left

## 2016-12-21 MED ORDER — FENTANYL CITRATE (PF) 100 MCG/2ML IJ SOLN
INTRAMUSCULAR | Status: DC | PRN
  Administered 2016-12-21 (×3): 50 ug via INTRAVENOUS

## 2016-12-21 MED ORDER — METOCLOPRAMIDE HCL 5 MG/ML IJ SOLN: 5.0000 mg | Freq: Three times a day (TID) | INTRAMUSCULAR | Status: DC | PRN

## 2016-12-21 MED ORDER — CEFAZOLIN SODIUM-DEXTROSE 2-4 GM/100ML-% IV SOLN
2.0000 g | Freq: Four times a day (QID) | INTRAVENOUS | Status: AC
  Administered 2016-12-21 – 2016-12-22 (×2): 2 g via INTRAVENOUS
  Filled 2016-12-21 (×2): qty 100

## 2016-12-21 MED ORDER — DIFLUPREDNATE 0.05 % OP EMUL: 1.0000 [drp] | Freq: Two times a day (BID) | OPHTHALMIC | Status: DC

## 2016-12-21 MED ORDER — MIDAZOLAM HCL 2 MG/2ML IJ SOLN
INTRAMUSCULAR | Status: AC
  Administered 2016-12-21: 2 mg via INTRAVENOUS
  Filled 2016-12-21: qty 2

## 2016-12-21 MED ORDER — ROCURONIUM BROMIDE 100 MG/10ML IV SOLN
INTRAVENOUS | Status: DC | PRN
  Administered 2016-12-21: 50 mg via INTRAVENOUS

## 2016-12-21 MED ORDER — EPHEDRINE SULFATE 50 MG/ML IJ SOLN
INTRAMUSCULAR | Status: DC | PRN
  Administered 2016-12-21: 5 mg via INTRAVENOUS

## 2016-12-21 MED ORDER — SUGAMMADEX SODIUM 200 MG/2ML IV SOLN
INTRAVENOUS | Status: AC
  Filled 2016-12-21: qty 2

## 2016-12-21 MED ORDER — ACETAMINOPHEN 650 MG RE SUPP: 650.0000 mg | Freq: Four times a day (QID) | RECTAL | Status: DC | PRN

## 2016-12-21 MED ORDER — METOCLOPRAMIDE HCL 10 MG PO TABS: 5.0000 mg | ORAL_TABLET | Freq: Three times a day (TID) | ORAL | Status: DC | PRN

## 2016-12-21 MED ORDER — PHENYLEPHRINE HCL 10 MG/ML IJ SOLN
INTRAMUSCULAR | Status: DC | PRN
  Administered 2016-12-21 (×5): 80 ug via INTRAVENOUS

## 2016-12-21 MED ORDER — PHENYLEPHRINE HCL 10 MG/ML IJ SOLN
INTRAMUSCULAR | Status: DC | PRN
  Administered 2016-12-21: 25 ug/min via INTRAVENOUS
  Administered 2016-12-21: 10 ug/min via INTRAVENOUS

## 2016-12-21 MED ORDER — PROPOFOL 10 MG/ML IV BOLUS
INTRAVENOUS | Status: DC | PRN
  Administered 2016-12-21: 40 mg via INTRAVENOUS
  Administered 2016-12-21: 160 mg via INTRAVENOUS

## 2016-12-21 MED ORDER — METHOCARBAMOL 500 MG PO TABS
500.0000 mg | ORAL_TABLET | Freq: Four times a day (QID) | ORAL | Status: DC | PRN
  Administered 2016-12-22 (×2): 500 mg via ORAL
  Filled 2016-12-21 (×2): qty 1

## 2016-12-21 MED ORDER — ACETAMINOPHEN 325 MG PO TABS: 650.0000 mg | ORAL_TABLET | Freq: Four times a day (QID) | ORAL | Status: DC | PRN

## 2016-12-21 MED ORDER — VITAMIN D (ERGOCALCIFEROL) 50 MCG (2000 UT) PO CAPS: 1.0000 | ORAL_CAPSULE | Freq: Every day | ORAL | Status: DC

## 2016-12-21 MED ORDER — LIDOCAINE HCL (CARDIAC) 20 MG/ML IV SOLN
INTRAVENOUS | Status: DC | PRN
  Administered 2016-12-21: 80 mg via INTRAVENOUS

## 2016-12-21 MED ORDER — LIDOCAINE 2% (20 MG/ML) 5 ML SYRINGE
INTRAMUSCULAR | Status: AC
  Filled 2016-12-21: qty 5

## 2016-12-21 MED ORDER — VITAMIN D 1000 UNITS PO TABS
2000.0000 [IU] | ORAL_TABLET | Freq: Every day | ORAL | Status: DC
  Administered 2016-12-22 – 2016-12-23 (×2): 2000 [IU] via ORAL
  Filled 2016-12-21 (×2): qty 2

## 2016-12-21 MED ORDER — VANCOMYCIN HCL 500 MG IV SOLR
INTRAVENOUS | Status: AC
  Filled 2016-12-21: qty 500

## 2016-12-21 MED ORDER — MIDAZOLAM HCL 2 MG/2ML IJ SOLN
INTRAMUSCULAR | Status: AC
  Filled 2016-12-21: qty 2

## 2016-12-21 MED ORDER — CHLORHEXIDINE GLUCONATE 4 % EX LIQD: 60.0000 mL | Freq: Once | CUTANEOUS | Status: DC

## 2016-12-21 MED ORDER — MORPHINE SULFATE (PF) 4 MG/ML IV SOLN: 4.0000 mg | INTRAVENOUS | Status: DC | PRN

## 2016-12-21 MED ORDER — AMLODIPINE BESYLATE 5 MG PO TABS
5.0000 mg | ORAL_TABLET | Freq: Every day | ORAL | Status: DC
  Administered 2016-12-22 – 2016-12-23 (×2): 5 mg via ORAL
  Filled 2016-12-21 (×2): qty 1

## 2016-12-21 MED ORDER — HYDROMORPHONE HCL 2 MG PO TABS
2.0000 mg | ORAL_TABLET | ORAL | Status: DC | PRN
  Administered 2016-12-22 – 2016-12-23 (×5): 2 mg via ORAL
  Filled 2016-12-21 (×5): qty 1

## 2016-12-21 MED ORDER — ONDANSETRON HCL 4 MG/2ML IJ SOLN
4.0000 mg | Freq: Four times a day (QID) | INTRAMUSCULAR | Status: DC | PRN
  Filled 2016-12-21: qty 2

## 2016-12-21 MED ORDER — PANTOPRAZOLE SODIUM 40 MG PO TBEC
40.0000 mg | DELAYED_RELEASE_TABLET | Freq: Every day | ORAL | Status: DC
  Administered 2016-12-21 – 2016-12-23 (×3): 40 mg via ORAL
  Filled 2016-12-21 (×3): qty 1

## 2016-12-21 MED ORDER — DEXTROSE 5 % IV SOLN
500.0000 mg | Freq: Four times a day (QID) | INTRAVENOUS | Status: DC | PRN
  Filled 2016-12-21: qty 5

## 2016-12-21 MED ORDER — ROCURONIUM BROMIDE 10 MG/ML (PF) SYRINGE
PREFILLED_SYRINGE | INTRAVENOUS | Status: AC
  Filled 2016-12-21: qty 5

## 2016-12-21 MED ORDER — ONDANSETRON HCL 4 MG/2ML IJ SOLN
INTRAMUSCULAR | Status: AC
  Filled 2016-12-21: qty 2

## 2016-12-21 MED ORDER — MIDAZOLAM HCL 5 MG/5ML IJ SOLN
INTRAMUSCULAR | Status: DC | PRN
  Administered 2016-12-21: 2 mg via INTRAVENOUS

## 2016-12-21 MED ORDER — GLYCOPYRROLATE 0.2 MG/ML IJ SOLN
INTRAMUSCULAR | Status: DC | PRN
  Administered 2016-12-21: 0.2 mg via INTRAVENOUS

## 2016-12-21 MED ORDER — LEVOTHYROXINE SODIUM 100 MCG PO TABS
100.0000 ug | ORAL_TABLET | Freq: Every day | ORAL | Status: DC
  Administered 2016-12-22 – 2016-12-23 (×2): 100 ug via ORAL
  Filled 2016-12-21 (×2): qty 1

## 2016-12-21 MED ORDER — LACTATED RINGERS IV SOLN
INTRAVENOUS | Status: DC
  Administered 2016-12-21 (×4): via INTRAVENOUS

## 2016-12-21 MED ORDER — ASPIRIN EC 325 MG PO TBEC
325.0000 mg | DELAYED_RELEASE_TABLET | Freq: Every day | ORAL | Status: DC
  Administered 2016-12-21: 325 mg via ORAL
  Filled 2016-12-21 (×3): qty 1

## 2016-12-21 MED ORDER — FENTANYL CITRATE (PF) 100 MCG/2ML IJ SOLN
INTRAMUSCULAR | Status: AC
  Administered 2016-12-21: 50 ug via INTRAVENOUS
  Filled 2016-12-21: qty 2

## 2016-12-21 MED ORDER — CHLORHEXIDINE GLUCONATE 4 % EX LIQD
60.0000 mL | Freq: Once | CUTANEOUS | Status: DC
Start: 2016-12-21 — End: 2016-12-21

## 2016-12-21 MED ORDER — PROPOFOL 10 MG/ML IV BOLUS
INTRAVENOUS | Status: AC
  Filled 2016-12-21: qty 20

## 2016-12-21 MED ORDER — FENTANYL CITRATE (PF) 100 MCG/2ML IJ SOLN
50.0000 ug | Freq: Once | INTRAMUSCULAR | Status: AC
  Administered 2016-12-21: 50 ug via INTRAVENOUS

## 2016-12-21 MED ORDER — FENTANYL CITRATE (PF) 250 MCG/5ML IJ SOLN
INTRAMUSCULAR | Status: AC
  Filled 2016-12-21: qty 5

## 2016-12-21 MED ORDER — SUCCINYLCHOLINE CHLORIDE 200 MG/10ML IV SOSY
PREFILLED_SYRINGE | INTRAVENOUS | Status: AC
  Filled 2016-12-21: qty 10

## 2016-12-21 MED ORDER — ALBUMIN HUMAN 5 % IV SOLN
INTRAVENOUS | Status: DC | PRN
  Administered 2016-12-21 (×2): via INTRAVENOUS

## 2016-12-21 MED ORDER — SUGAMMADEX SODIUM 200 MG/2ML IV SOLN
INTRAVENOUS | Status: DC | PRN
  Administered 2016-12-21: 200 mg via INTRAVENOUS

## 2016-12-21 MED ORDER — PHENOL 1.4 % MT LIQD: 1.0000 | OROMUCOSAL | Status: DC | PRN

## 2016-12-21 MED ORDER — DOCUSATE SODIUM 100 MG PO CAPS
100.0000 mg | ORAL_CAPSULE | Freq: Two times a day (BID) | ORAL | Status: DC
  Administered 2016-12-21 – 2016-12-23 (×4): 100 mg via ORAL
  Filled 2016-12-21 (×4): qty 1

## 2016-12-21 MED ORDER — ONDANSETRON HCL 4 MG PO TABS: 4.0000 mg | ORAL_TABLET | Freq: Four times a day (QID) | ORAL | Status: DC | PRN

## 2016-12-21 MED ORDER — CEFAZOLIN SODIUM-DEXTROSE 2-4 GM/100ML-% IV SOLN
2.0000 g | INTRAVENOUS | Status: AC
  Administered 2016-12-21 (×2): 2 g via INTRAVENOUS

## 2016-12-21 MED ORDER — VANCOMYCIN HCL 500 MG IV SOLR
INTRAVENOUS | Status: DC | PRN
  Administered 2016-12-21: 500 mg via TOPICAL

## 2016-12-21 MED ORDER — CEFAZOLIN SODIUM-DEXTROSE 2-4 GM/100ML-% IV SOLN
INTRAVENOUS | Status: AC
  Filled 2016-12-21: qty 100

## 2016-12-21 MED ORDER — ONDANSETRON HCL 4 MG/2ML IJ SOLN
INTRAMUSCULAR | Status: DC | PRN
  Administered 2016-12-21: 4 mg via INTRAVENOUS

## 2016-12-21 MED ORDER — PHENYLEPHRINE 40 MCG/ML (10ML) SYRINGE FOR IV PUSH (FOR BLOOD PRESSURE SUPPORT)
PREFILLED_SYRINGE | INTRAVENOUS | Status: AC
  Filled 2016-12-21: qty 10

## 2016-12-21 MED ORDER — POTASSIUM CHLORIDE IN NACL 20-0.9 MEQ/L-% IV SOLN
INTRAVENOUS | Status: AC
  Administered 2016-12-21 – 2016-12-22 (×2): via INTRAVENOUS
  Filled 2016-12-21 (×2): qty 1000

## 2016-12-21 MED ORDER — SUCCINYLCHOLINE CHLORIDE 20 MG/ML IJ SOLN
INTRAMUSCULAR | Status: DC | PRN
  Administered 2016-12-21: 100 mg via INTRAVENOUS

## 2016-12-21 MED ORDER — EPHEDRINE 5 MG/ML INJ
INTRAVENOUS | Status: AC
  Filled 2016-12-21: qty 10

## 2016-12-21 MED ORDER — SODIUM CHLORIDE 0.9 % IR SOLN
Status: DC | PRN
  Administered 2016-12-21: 4000 mL

## 2016-12-21 MED ORDER — MENTHOL 3 MG MT LOZG: 1.0000 | LOZENGE | OROMUCOSAL | Status: DC | PRN

## 2016-12-21 MED ORDER — MIDAZOLAM HCL 2 MG/2ML IJ SOLN
2.0000 mg | Freq: Once | INTRAMUSCULAR | Status: AC
  Administered 2016-12-21: 2 mg via INTRAVENOUS

## 2016-12-21 SURGICAL SUPPLY — 63 items
AID PSTN UNV HD RSTRNT DISP (MISCELLANEOUS) ×1
BLADE LONG MED 31X9 (MISCELLANEOUS) ×2 IMPLANT
BLADE SAW SGTL 13X75X1.27 (BLADE) ×2 IMPLANT
CAPT SHLDR TOTAL 2 ×2 IMPLANT
CEMENT BONE R 1X40 (Cement) ×2 IMPLANT
COVER SURGICAL LIGHT HANDLE (MISCELLANEOUS) ×2 IMPLANT
DRAPE INCISE IOBAN 66X45 STRL (DRAPES) ×4 IMPLANT
DRAPE U-SHAPE 47X51 STRL (DRAPES) ×4 IMPLANT
DRSG AQUACEL AG ADV 3.5X 4 (GAUZE/BANDAGES/DRESSINGS) ×2 IMPLANT
DRSG AQUACEL AG ADV 3.5X10 (GAUZE/BANDAGES/DRESSINGS) ×2 IMPLANT
DURAPREP 26ML APPLICATOR (WOUND CARE) ×4 IMPLANT
ELECT BLADE 4.0 EZ CLEAN MEGAD (MISCELLANEOUS) ×2
ELECT REM PT RETURN 9FT ADLT (ELECTROSURGICAL) ×2
ELECTRODE BLDE 4.0 EZ CLN MEGD (MISCELLANEOUS) ×1 IMPLANT
ELECTRODE REM PT RTRN 9FT ADLT (ELECTROSURGICAL) ×1 IMPLANT
FIBERTAPE TENDON COMP KIT (KITS) ×2 IMPLANT
GLOVE BIOGEL PI IND STRL 7.5 (GLOVE) ×1 IMPLANT
GLOVE BIOGEL PI IND STRL 8 (GLOVE) ×1 IMPLANT
GLOVE BIOGEL PI INDICATOR 7.5 (GLOVE) ×1
GLOVE BIOGEL PI INDICATOR 8 (GLOVE) ×1
GLOVE ECLIPSE 7.0 STRL STRAW (GLOVE) ×2 IMPLANT
GLOVE SURG ORTHO 8.0 STRL STRW (GLOVE) ×2 IMPLANT
GOWN STRL REUS W/ TWL LRG LVL3 (GOWN DISPOSABLE) ×2 IMPLANT
GOWN STRL REUS W/ TWL XL LVL3 (GOWN DISPOSABLE) ×1 IMPLANT
GOWN STRL REUS W/TWL LRG LVL3 (GOWN DISPOSABLE) ×2
GOWN STRL REUS W/TWL XL LVL3 (GOWN DISPOSABLE) ×2
KIT BASIN OR (CUSTOM PROCEDURE TRAY) ×2 IMPLANT
KIT BEACH CHAIR TRIMANO (MISCELLANEOUS) ×2 IMPLANT
KIT ROOM TURNOVER OR (KITS) ×2 IMPLANT
MANIFOLD NEPTUNE II (INSTRUMENTS) ×2 IMPLANT
NDL SUT 6 .5 CRC .975X.05 MAYO (NEEDLE) ×1 IMPLANT
NEEDLE HYPO 25GX1X1/2 BEV (NEEDLE) IMPLANT
NEEDLE MAYO TAPER (NEEDLE) ×1
NS IRRIG 1000ML POUR BTL (IV SOLUTION) ×8 IMPLANT
PACK SHOULDER (CUSTOM PROCEDURE TRAY) ×2 IMPLANT
PAD ARMBOARD 7.5X6 YLW CONV (MISCELLANEOUS) ×4 IMPLANT
PASSER SUT SWANSON 36MM LOOP (INSTRUMENTS) ×2 IMPLANT
PIN HUMERAL STMN 3.2MMX9IN (INSTRUMENTS) ×2 IMPLANT
PIN STEINMANN THREADED TIP (PIN) ×4 IMPLANT
RESTRAINT HEAD UNIVERSAL NS (MISCELLANEOUS) ×2 IMPLANT
SLING ARM IMMOBILIZER LRG (SOFTGOODS) ×2 IMPLANT
SPONGE LAP 18X18 X RAY DECT (DISPOSABLE) ×2 IMPLANT
STRIP CLOSURE SKIN 1/2X4 (GAUZE/BANDAGES/DRESSINGS) ×2 IMPLANT
SUCTION FRAZIER HANDLE 10FR (MISCELLANEOUS) ×1
SUCTION TUBE FRAZIER 10FR DISP (MISCELLANEOUS) ×1 IMPLANT
SUT FIBERWIRE #2 38 T-5 BLUE (SUTURE) ×2
SUT MAXBRAID (SUTURE) ×4 IMPLANT
SUT MNCRL AB 3-0 PS2 18 (SUTURE) ×2 IMPLANT
SUT SILK 2 0 TIES 10X30 (SUTURE) ×2 IMPLANT
SUT VIC AB 0 CT1 27 (SUTURE) ×8
SUT VIC AB 0 CT1 27XBRD ANBCTR (SUTURE) ×4 IMPLANT
SUT VIC AB 1 CT1 27 (SUTURE) ×2
SUT VIC AB 1 CT1 27XBRD ANBCTR (SUTURE) ×1 IMPLANT
SUT VIC AB 2-0 CT1 27 (SUTURE) ×4
SUT VIC AB 2-0 CT1 TAPERPNT 27 (SUTURE) ×2 IMPLANT
SUT VICRYL 0 UR6 27IN ABS (SUTURE) ×4 IMPLANT
SUTURE FIBERWR #2 38 T-5 BLUE (SUTURE) ×1 IMPLANT
SYR CONTROL 10ML LL (SYRINGE) IMPLANT
TOWEL OR 17X24 6PK STRL BLUE (TOWEL DISPOSABLE) ×2 IMPLANT
TOWEL OR 17X26 10 PK STRL BLUE (TOWEL DISPOSABLE) ×2 IMPLANT
TRAY FOLEY BAG SILVER LF 16FR (CATHETERS) ×2 IMPLANT
WATER STERILE IRR 1000ML POUR (IV SOLUTION) ×2 IMPLANT
fiber tape tendon compression bridge ×2 IMPLANT

## 2016-12-21 NOTE — Anesthesia Procedure Notes (Addendum)
Anesthesia Regional Block: Interscalene brachial plexus block   Pre-Anesthetic Checklist: ,, timeout performed, Correct Patient, Correct Site, Correct Laterality, Correct Procedure, Correct Position, site marked, Risks and benefits discussed,  Surgical consent,  Pre-op evaluation,  At surgeon's request and post-op pain management  Laterality: Upper and Left  Prep: chloraprep       Needles:  Injection technique: Single-shot  Needle Type: Stimulator Needle - 40     Needle Length: 4cm  Needle Gauge: 21   Needle insertion depth: 4 cm   Additional Needles:   Procedures: ultrasound guided,,,,,,,,   Nerve Stimulator or Paresthesia:  Response: Twitch elicited, 0.5 mA, 0.3 ms,   Additional Responses:   Narrative:  Start time: 12/21/2016 9:55 AM End time: 12/21/2016 10:14 AM Injection made incrementally with aspirations every 5 mL.  Performed by: Personally  Anesthesiologist: Silvestre Mines  Additional Notes: Block assessed prior to start of surgery

## 2016-12-21 NOTE — Progress Notes (Signed)
Report received from PACU, pt arrived at the unit via bed at 2130.

## 2016-12-21 NOTE — Progress Notes (Signed)
Victoria Reyes is a 66 y.o. female patient admitted from PACU awake, alert - oriented  X 4 - no acute distress noted.  VSS - Blood pressure 107/61, pulse 100, temperature 97.6 F (36.4 C), temperature source Oral, resp. rate 20, height 5' 1.5" (1.562 m), weight 94.3 kg (208 lb), SpO2 96 %.    IV in place, occlusive dsg intact without redness.  Orientation to room, and floor completed with information packet given to patient/family.  Patient declined safety video at this time.  Admission INP armband ID verified with patient/family, and in place.   SR up x 2, fall assessment complete, with patient and family able to verbalize understanding of risk associated with falls, and verbalized understanding to call nsg before up out of bed.  Call light within reach, patient able to voice, and demonstrate understanding.  Skin, clean-dry- intact without evidence of bruising, or skin tears.   No evidence of skin break down noted on exam.     Will cont to eval and treat per MD orders.  Dorris Carnes, RN 12/21/2016 11:01 PM

## 2016-12-21 NOTE — Anesthesia Procedure Notes (Signed)
Procedure Name: Intubation Date/Time: 12/21/2016 11:37 AM Performed by: Scheryl Darter Pre-anesthesia Checklist: Patient identified, Emergency Drugs available, Suction available and Patient being monitored Patient Re-evaluated:Patient Re-evaluated prior to induction Oxygen Delivery Method: Circle System Utilized Preoxygenation: Pre-oxygenation with 100% oxygen Induction Type: IV induction Ventilation: Mask ventilation without difficulty Laryngoscope Size: Miller and 2 Grade View: Grade I Tube type: Oral Tube size: 7.5 mm Number of attempts: 1 Airway Equipment and Method: Stylet and Oral airway Placement Confirmation: ETT inserted through vocal cords under direct vision,  positive ETCO2 and breath sounds checked- equal and bilateral Secured at: 21 cm Tube secured with: Tape Dental Injury: Teeth and Oropharynx as per pre-operative assessment

## 2016-12-21 NOTE — Transfer of Care (Signed)
Immediate Anesthesia Transfer of Care Note  Patient: Victoria Reyes  Procedure(s) Performed: Procedure(s): TOTAL SHOULDER ARTHROPLASTY (Left)  Patient Location: PACU  Anesthesia Type:GA combined with regional for post-op pain  Level of Consciousness: drowsy and responds to stimulation  Airway & Oxygen Therapy: Patient Spontanous Breathing and Patient connected to nasal cannula oxygen  Post-op Assessment: Report given to RN and Post -op Vital signs reviewed and stable  Post vital signs: Reviewed and stable  Last Vitals:  Vitals:   12/21/16 1025 12/21/16 1030  BP: (!) 147/54 (!) 145/63  Pulse: 98 91  Resp: (!) 23 (!) 21  Temp:    SpO2: 100% 100%    Last Pain:  Vitals:   12/21/16 0903  TempSrc: Oral      Patients Stated Pain Goal: 2 (17/79/39 0300)  Complications: No apparent anesthesia complications

## 2016-12-21 NOTE — Interval H&P Note (Signed)
History and Physical Interval Note:  12/21/2016 10:24 AM  Victoria Reyes  has presented today for surgery, with the diagnosis of LEFT SHOULDER OSTEOARTHRITIS  The various methods of treatment have been discussed with the patient and family. After consideration of risks, benefits and other options for treatment, the patient has consented to  Procedure(s): TOTAL SHOULDER ARTHROPLASTY (Left) as a surgical intervention .  The patient's history has been reviewed, patient examined, no change in status, stable for surgery.  I have reviewed the patient's chart and labs.  Questions were answered to the patient's satisfaction.     Anderson Malta

## 2016-12-21 NOTE — Brief Op Note (Signed)
12/21/2016  4:30 PM  PATIENT:  Victoria Reyes  66 y.o. female  PRE-OPERATIVE DIAGNOSIS:  LEFT SHOULDER OSTEOARTHRITIS  POST-OPERATIVE DIAGNOSIS:  LEFT SHOULDER OSTEOARTHRITIS  PROCEDURE:  Procedure(s): TOTAL SHOULDER ARTHROPLASTY  SURGEON:  Surgeon(s): Marlou Sa, Tonna Corner, MD  ASSISTANT: Laure Kidney rnfa  ANESTHESIA:   regional and general  EBL: 150 ml    Total I/O In: 2500 [I.V.:2000; IV Piggyback:500] Out: 750 [Urine:400; Blood:350]  BLOOD ADMINISTERED: none  DRAINS: none   LOCAL MEDICATIONS USED:  none  SPECIMEN:  No Specimen  COUNTS:  YES  TOURNIQUET:  * No tourniquets in log *  DICTATION: .Other Dictation: Dictation Number [id number]  PLAN OF CARE: Admit to inpatient   PATIENT DISPOSITION:  PACU - hemodynamically stable

## 2016-12-21 NOTE — Anesthesia Postprocedure Evaluation (Signed)
Anesthesia Post Note  Patient: Victoria Reyes  Procedure(s) Performed: Procedure(s) (LRB): TOTAL SHOULDER ARTHROPLASTY (Left)     Patient location during evaluation: PACU Anesthesia Type: General and Regional Level of consciousness: awake and alert Pain management: pain level controlled Vital Signs Assessment: post-procedure vital signs reviewed and stable Respiratory status: spontaneous breathing, nonlabored ventilation, respiratory function stable and patient connected to nasal cannula oxygen Cardiovascular status: blood pressure returned to baseline and stable Postop Assessment: no signs of nausea or vomiting Anesthetic complications: no    Last Vitals:  Vitals:   12/21/16 1657 12/21/16 1712  BP: 116/65 118/68  Pulse: 96 95  Resp: 16 17  Temp:    SpO2: 99% 99%    Last Pain:  Vitals:   12/21/16 1710  TempSrc:   PainSc: 0-No pain                 Cherril Hett,JAMES TERRILL

## 2016-12-21 NOTE — Anesthesia Preprocedure Evaluation (Addendum)
Anesthesia Evaluation  Patient identified by MRN, date of birth, ID band Patient awake    Reviewed: Allergy & Precautions, NPO status , Patient's Chart, lab work & pertinent test results  Airway Mallampati: II   Neck ROM: Full    Dental no notable dental hx.    Pulmonary former smoker,    breath sounds clear to auscultation       Cardiovascular hypertension,  Rhythm:Regular Rate:Normal     Neuro/Psych  Neuromuscular disease    GI/Hepatic hiatal hernia, GERD  ,  Endo/Other  Hypothyroidism Morbid obesity  Renal/GU      Musculoskeletal  (+) Arthritis ,   Abdominal (+) + obese,   Peds  Hematology  (+) anemia ,   Anesthesia Other Findings   Reproductive/Obstetrics                            Anesthesia Physical Anesthesia Plan  ASA: III  Anesthesia Plan: General   Post-op Pain Management:  Regional for Post-op pain   Induction: Intravenous  PONV Risk Score and Plan: 4 or greater and Ondansetron, Dexamethasone, Midazolam, Scopolamine patch - Pre-op and Treatment may vary due to age or medical condition  Airway Management Planned: Oral ETT  Additional Equipment:   Intra-op Plan:   Post-operative Plan: Extubation in OR  Informed Consent: I have reviewed the patients History and Physical, chart, labs and discussed the procedure including the risks, benefits and alternatives for the proposed anesthesia with the patient or authorized representative who has indicated his/her understanding and acceptance.   Dental advisory given  Plan Discussed with:   Anesthesia Plan Comments:         Anesthesia Quick Evaluation

## 2016-12-22 ENCOUNTER — Encounter (HOSPITAL_COMMUNITY): Payer: Self-pay | Admitting: Orthopedic Surgery

## 2016-12-22 NOTE — Care Management Note (Addendum)
Case Management Note  Patient Details  Name: Victoria Reyes MRN: [id number] Date of Birth: Dec 22, 1950  Subjective/Objective:       Admitted with shoulder arthritis.  - S/P  Left shoulder replacement  9/11            Darin Redmann (Son)     [phone]      PCP: Tula Nakayama  Action/Plan: PT, OT are evaluations pending...Marland KitchenCM to f/u with disposition needs.  Expected Discharge Date:              Expected Discharge Plan:  [place]  In-House Referral:     Discharge planning Services  CM Consult  Post Acute Care Choice:    Choice offered to:  Patient  DME Arranged:    [facility] DME Agency:   [facility] [phone], CM called and left voice message regarding C[facility].  HH Arranged:    [place] Agency:  [facility]),   Status of Service:  In process, will continue to follow  If discussed at Long Length of Stay Meetings, dates discussed:    Additional Comments:  Sharin Mons, RN 12/22/2016, 10:50 AM

## 2016-12-22 NOTE — Evaluation (Addendum)
Physical Therapy Evaluation Patient Details Name: Victoria Reyes MRN: [id number] DOB: 06-May-1950 Today's Date: 12/22/2016   History of Present Illness  Pt admitted for elective L TSA. PMH: back pain, HTN, hypothyroidism.  Clinical Impression  Pt admitted with/for elective L TSA.  Mobility is a struggle, but expect pt to be slow, but independent with mobility in the home.  Expect that after short HHPT she should eventually transition to OPPT.  Pt currently limited functionally due to the problems listed below.  (see problems list.)  Pt will benefit from PT to maximize function and safety to be able to get home safely with available assist.     Follow Up Recommendations Home health PT;Other (comment) (only as it applies to shoulder, pt will not need PT assist with mobility, expect transfer to OPPT)    Equipment Recommendations       Recommendations for Other Services       Precautions / Restrictions Precautions Precautions: Shoulder Type of Shoulder Precautions: no shoulder movement, AROM elbow to hand, shoulder immobilizer Shoulder Interventions: Shoulder sling/immobilizer Precaution Booklet Issued: No Precaution Comments: educated on NWB Restrictions Weight Bearing Restrictions: Yes Other Position/Activity Restrictions: NWB R UE      Mobility  Bed Mobility Overal bed mobility: Needs Assistance Bed Mobility: Rolling;Sidelying to Sit Rolling: Supervision Sidelying to sit: Supervision       General bed mobility comments: increased time, simulated use of her won headboard, no use of rail or need for assist.  Transfers Overall transfer level: Needs assistance Equipment used: None Transfers: Sit to/from [facility] Sit to Stand: Supervision            Ambulation/Gait Ambulation/Gait assistance: Supervision Ambulation Distance (Feet): 200 Feet Assistive device: None Gait Pattern/deviations: Step-through pattern   Gait velocity interpretation: at  or above normal speed for age/gender General Gait Details: generally safe and steady, able to ambulate at age appropriate speed.  Stairs Stairs: Yes Stairs assistance: Supervision Stair Management: One rail Right;Alternating pattern;Forwards Number of Stairs: 3 General stair comments: safe with rail or support  Wheelchair Mobility    Modified Rankin (Stroke Patients Only)       Balance Overall balance assessment: No apparent balance deficits (not formally assessed)                                           Pertinent Vitals/Pain Pain Assessment: 0-10 Pain Score: 9  Pain Location: L shoulder Pain Descriptors / Indicators: Aching;Constant Pain Intervention(s): Monitored during session    Home Living Family/patient expects to be discharged to:: Private residence Living Arrangements: Children Available Help at Discharge: Family;Available 24 hours/day Type of Home: House Home Access: Stairs to enter Entrance Stairs-Rails: Psychiatric nurse of Steps: several  Home Layout: One level Home Equipment: None      Prior Function Level of Independence: Independent         Comments: pt had been compensating for inability to use shoulder prior to admission     Hand Dominance   Dominant Hand: Right    Extremity/Trunk Assessment   Upper Extremity Assessment Upper Extremity Assessment: Defer to OT evaluation    Lower Extremity Assessment Lower Extremity Assessment: Overall WFL for tasks assessed       Communication   Communication: No difficulties  Cognition Arousal/Alertness: Awake/alert Behavior During Therapy: WFL for tasks assessed/performed Overall Cognitive Status: Within Functional Limits for tasks assessed  General Comments      Exercises     Assessment/Plan    PT Assessment All further PT needs can be met in the next venue of care  PT Problem List          PT Treatment Interventions      PT Goals (Current goals can be found in the Care Plan section)  Acute Rehab PT Goals Patient Stated Goal: to be able to pick up my grandkids    Frequency     Barriers to discharge        Co-evaluation               AM-PAC PT "6 Clicks" Daily Activity  Outcome Measure Difficulty turning over in bed (including adjusting bedclothes, sheets and blankets)?: A Lot Difficulty moving from lying on back to sitting on the side of the bed? : A Lot Difficulty sitting down on and standing up from a chair with arms (e.g., wheelchair, bedside commode, etc,.)?: A Little Help needed moving to and from a bed to chair (including a wheelchair)?: A Little Help needed walking in hospital room?: A Little Help needed climbing 3-5 steps with a railing? : A Little 6 Click Score: 16    End of Session   Activity Tolerance: Patient tolerated treatment well Patient left: in bed;with call bell/phone within reach Nurse Communication: Mobility status PT Visit Diagnosis: Pain Pain - Right/Left: Left Pain - part of body: Shoulder    Time: 5035-4656 PT Time Calculation (min) (ACUTE ONLY): 21 min   Charges:   PT Evaluation $PT Eval Low Complexity: 1 Low     PT G Codes:        Jan 01, 2017  Donnella Sham, PT [phone]  (pager)  Victoria Reyes 01/01/2017, 3:35 PM

## 2016-12-22 NOTE — Op Note (Signed)
Victoria Reyes, Victoria Reyes NO.:  [id number]  MEDICAL RECORD NO.:  [id number]  LOCATION:  [facility]                         FACILITY:  [place]  PHYSICIAN:  Anderson Malta, M.D.    DATE OF BIRTH:  September 23, 1950  DATE OF PROCEDURE: DATE OF DISCHARGE:                              OPERATIVE REPORT   PREOPERATIVE DIAGNOSIS:  Left shoulder arthritis.  POSTOPERATIVE DIAGNOSIS:  Left shoulder arthritis.  PROCEDURE:  Left shoulder replacement, Biomet small glenoid 10 press-fit mini humeral stem, 38-mm x 18-mm humeral head.  SURGEON:  Anderson Malta, M.D.  ASSISTANT:  Laure Kidney, RNFA.  INDICATIONS:  Victoria Reyes is a 66 year old patient with end-stage shoulder arthritis who presents for operative management after explanation of risks and benefits.  PROCEDURE IN DETAIL:  The patient was brought to the operating room where general endotracheal anesthesia was induced.  Preoperative antibiotics were administered.  Time-out was called.  The patient was placed in the beach-chair position with the head in neutral position. Left arm, shoulder and hand were prescrubbed with alcohol and Betadine, allowed to air dry, prepped with DuraPrep solution and draped in sterile manner.  Victoria Reyes was used to cover the entire operative field.  Time-out was called.  Deltopectoral approach was made.  Cephalic vein was mobilized medially.  The deltopectoral interval was developed.  The biceps tendon was identified and tenodesed to the upper portion of the pectoralis major tendon using FiberWire suture.  The rotator interval was then opened.  At this time, the circumflex vessels were tied.  The axillary nerve was then palpated and visualized, and vessel loop was placed around it.  It was protected at all times during the case.  At this time, the lesser tuberosity osteotomy was performed with an oscillating saw.  This was tagged with tagging sutures and reflected back.  The head was dislocated.  Capsular dissection  was performed to the 7 o'clock position on the humeral head.  It was also performed to about the 5 o'clock position on the glenoid face.  This allowed for the humeral head to be shifted posteriorly.  At this time, the humeral head cut was made using intramedullary alignment with the arm in 30 degrees of retroversion.  The head was removed and sized on the back table. Broaching was performed up to size 10.  Then, the glenoid was prepared. The cap was placed onto the cut humeral surface.  The central portion of the glenoid was visualized.  In accordance with preoperative templating using CT scan, the glenoid vault was entered with guidepin.  Central peg reaming was performed along with peripheral peg drilling.  Cut bony surfaces were prepared.  At this time, thorough irrigation was performed and the glenoid was then press-fit and cemented into position with good fixation achieved.  Attention was then directed towards the humeral head.  The humeral head was then placed on the trial and then it was reduced, found to have excellent internal rotation so that the arm was parallel to the torso.  Full abduction, external rotation as well as the patient was able to put her hand on her head.  Stability was about half the humeral head, amount of inferior translation.  Following this, the trial components were removed on the humeral side.  True components placed.  Lesser tuberosity osteotomy was then repaired using suture placed around the prosthesis and through the lesser tuberosity.  This gave secure fixation.  At this time, the rotator interval was closed with the arm in about 30 degrees of external rotation.  Axillary nerve again palpated, found to be intact.  Vessel loop removed.  Thorough irrigation performed.  Vancomycin powder placed both within the joint as well as outside of the joint that the pectoral interval closed using #1 Vicryl suture followed by interrupted inverted 0 Vicryl suture,  2-0 Vicryl suture and 3-0 Monocryl.  The patient tolerated the procedure well without immediate complications.  Aquacel dressing placed. Shoulder immobilizer placed.     Anderson Malta, M.D.     GSD/MEDQ  D:  12/21/2016  T:  12/22/2016  Job:  [id number]

## 2016-12-22 NOTE — Evaluation (Signed)
Occupational Therapy Evaluation Patient Details Name: Victoria Reyes MRN: [id number] DOB: 1950-04-25 Today's Date: 12/22/2016    History of Present Illness Pt admitted for elective L TSA. PMH: back pain, HTN, hypothyroidism.   Clinical Impression   Pt was independent prior to admission. Reports 10/10 pain in L shoulder. Educated in positioning L UE in bed and chair, donning and doffing shoulder immobilizer and compensatory strategies for ADL. Pt performed AROM L elbow to hand. Transferred to chair with supervision and assist for foley and IV pole. Will continue to follow.    Follow Up Recommendations  DC plan and follow up therapy as arranged by surgeon    Equipment Recommendations  None recommended by OT    Recommendations for Other Services       Precautions / Restrictions Precautions Precautions: Shoulder Type of Shoulder Precautions: no shoulder movement, AROM elbow to hand, shoulder immobilizer Shoulder Interventions: Shoulder sling/immobilizer Precaution Booklet Issued: Yes (comment) Precaution Comments: educated in NWB, donning and doffing immobilizer, compensatory strategies for bathing and dressing Restrictions Weight Bearing Restrictions: Yes Other Position/Activity Restrictions: NWB R UE      Mobility Bed Mobility Overal bed mobility: Needs Assistance Bed Mobility: Rolling;Sidelying to Sit Rolling: Supervision Sidelying to sit: Supervision       General bed mobility comments: increased time, instructed to get up to R side of bed to avoid weightbearing on L UE, pt seeking assistance, but did not need  Transfers Overall transfer level: Needs assistance Equipment used: None Transfers: Sit to/from [facility] Sit to Stand: Supervision Stand pivot transfers: Supervision       General transfer comment: for safety, no physical assist, assist for IV pole and foley bag    Balance                                            ADL either performed or assessed with clinical judgement   ADL Overall ADL's : Needs assistance/impaired Eating/Feeding: Set up;Sitting Eating/Feeding Details (indicate cue type and reason): assisted to open packages Grooming: Wash/dry hands;Sitting;Set up   Upper Body Bathing: Minimal assistance;Sitting   Lower Body Bathing: Minimal assistance;Sit to/from stand   Upper Body Dressing : Minimal assistance;Sitting   Lower Body Dressing: Minimal assistance;Sit to/from stand   Toilet Transfer: Supervision/safety;Ambulation;BSC;RW   Toileting- Clothing Manipulation and Hygiene: Supervision/safety;Sit to/from stand       Functional mobility during ADLs: Supervision/safety       Vision Patient Visual Report: No change from baseline       Perception     Praxis      Pertinent Vitals/Pain Pain Assessment: 0-10 Pain Score: 10-Worst pain ever Pain Location: L shoulder Pain Descriptors / Indicators: Aching;Constant Pain Intervention(s): Monitored during session;Repositioned;Ice applied;RN gave pain meds during session     Hand Dominance Right   Extremity/Trunk Assessment Upper Extremity Assessment Upper Extremity Assessment: LUE deficits/detail LUE Deficits / Details: performed AROM L elbow to hand x 5 in supine LUE: Unable to fully assess due to immobilization LUE Coordination: decreased gross motor   Lower Extremity Assessment Lower Extremity Assessment: Overall WFL for tasks assessed       Communication Communication Communication: No difficulties   Cognition Arousal/Alertness: Awake/alert Behavior During Therapy: Flat affect Overall Cognitive Status: Within Functional Limits for tasks assessed  General Comments       Exercises     Shoulder Instructions      Home Living Family/patient expects to be discharged to:: Private residence Living Arrangements: Children Available Help at Discharge:  Family;Available 24 hours/day Type of Home: House                       Home Equipment: None          Prior Functioning/Environment Level of Independence: Independent        Comments: pt had been compensating for inability to use shoulder prior to admission        OT Problem List: Decreased coordination;Pain;Decreased knowledge of precautions;Decreased range of motion      OT Treatment/Interventions: Self-care/ADL training;Patient/family education;Therapeutic exercise    OT Goals(Current goals can be found in the care plan section) Acute Rehab OT Goals Patient Stated Goal: to be able to pick up my grandkids OT Goal Formulation: With patient Time For Goal Achievement: 12/29/16 Potential to Achieve Goals: Good ADL Goals Pt Will Perform Grooming: with modified independence;standing Pt Will Perform Upper Body Bathing: with modified independence;sitting Pt Will Perform Upper Body Dressing: with modified independence;sitting Pt Will Transfer to Toilet: with modified independence;ambulating;regular height toilet Pt Will Perform Toileting - Clothing Manipulation and hygiene: with modified independence;sit to/from stand Pt/caregiver will Perform Home Exercise Program: Left upper extremity;Independently (elbow>hand AROM only) Additional ADL Goal #1: Pt and caregiver will be independent in donning and doffing shoulder immobilizer.  OT Frequency: Min 2X/week   Barriers to D/C:            Co-evaluation              AM-PAC PT "6 Clicks" Daily Activity     Outcome Measure Help from another person eating meals?: A Little Help from another person taking care of personal grooming?: A Little Help from another person toileting, which includes using toliet, bedpan, or urinal?: A Little Help from another person bathing (including washing, rinsing, drying)?: A Little Help from another person to put on and taking off regular upper body clothing?: A Little Help from another  person to put on and taking off regular lower body clothing?: A Little 6 Click Score: 18   End of Session Nurse Communication: Mobility status (??why pt has foley when she moves well)  Activity Tolerance: Patient limited by pain Patient left: in chair;with call bell/phone within reach;with nursing/sitter in room  OT Visit Diagnosis: Pain Pain - Right/Left: Left Pain - part of body: Shoulder                Time: 3664-4034 OT Time Calculation (min): 25 min Charges:  OT General Charges $OT Visit: 1 Visit OT Evaluation $OT Eval Moderate Complexity: 1 Mod OT Treatments $Therapeutic Exercise: 8-22 mins G-Codes:     2017/01/04 Nestor Lewandowsky, OTR/L Pager: [phone] Werner Lean, Haze Boyden Jan 04, 2017, 11:30 AM

## 2016-12-23 ENCOUNTER — Telehealth (INDEPENDENT_AMBULATORY_CARE_PROVIDER_SITE_OTHER): Payer: Self-pay

## 2016-12-23 ENCOUNTER — Other Ambulatory Visit (INDEPENDENT_AMBULATORY_CARE_PROVIDER_SITE_OTHER): Payer: Self-pay

## 2016-12-23 MED ORDER — HYDROMORPHONE HCL 2 MG PO TABS: 2.0000 mg | ORAL_TABLET | Freq: Four times a day (QID) | ORAL | 0 refills | Status: DC | PRN

## 2016-12-23 MED ORDER — BACLOFEN 10 MG PO TABS: ORAL_TABLET | ORAL | 0 refills | Status: DC

## 2016-12-23 MED ORDER — DOCUSATE SODIUM 100 MG PO CAPS: 100.0000 mg | ORAL_CAPSULE | Freq: Two times a day (BID) | ORAL | 0 refills | Status: DC

## 2016-12-23 MED ORDER — METHOCARBAMOL 500 MG PO TABS: 500.0000 mg | ORAL_TABLET | Freq: Four times a day (QID) | ORAL | 0 refills | Status: DC | PRN

## 2016-12-23 NOTE — Progress Notes (Signed)
Occupational Therapy Treatment Patient Details Name: Victoria Reyes MRN: [id number] DOB: Aug 27, 1950 Today's Date: 12/23/2016    History of present illness Pt admitted for elective L TSA. PMH: back pain, HTN, hypothyroidism.   OT comments  Pt is mobilizing well. Performed ADL standing at sink with set to min assist and AROM L elbow to hand x 10. Reinforced donning and doffing shoulder immobilizer. Pt is ready to go home.  Follow Up Recommendations  DC plan and follow up therapy as arranged by surgeon    Equipment Recommendations  None recommended by OT    Recommendations for Other Services      Precautions / Restrictions Precautions Precautions: Shoulder Type of Shoulder Precautions: no shoulder movement, AROM elbow to hand, shoulder immobilizer Shoulder Interventions: Shoulder sling/immobilizer Precaution Booklet Issued: Yes (comment) Precaution Comments: reinforced NWB, sling use, positioning in bed and chair and compensatory strategies for ADL Required Braces or Orthoses: Sling Restrictions Other Position/Activity Restrictions: NWB R UE       Mobility Bed Mobility Overal bed mobility: Modified Independent             General bed mobility comments: increased time, no assist  Transfers Overall transfer level: Needs assistance Equipment used: None Transfers: Sit to/from Stand Sit to Stand: Modified independent (Device/Increase time)              Balance                                           ADL either performed or assessed with clinical judgement   ADL Overall ADL's : Needs assistance/impaired     Grooming: Wash/dry face;Standing;Modified independent;Oral care           Upper Body Dressing : Minimal assistance;Sitting   Lower Body Dressing: Minimal assistance;Sit to/from stand   Toilet Transfer: Modified Dentist and Hygiene: Modified independent       Functional mobility during  ADLs: Modified independent General ADL Comments: educated in donning and doffing shoulder immobilizer     Vision       Perception     Praxis      Cognition Arousal/Alertness: Awake/alert Behavior During Therapy: WFL for tasks assessed/performed Overall Cognitive Status: Within Functional Limits for tasks assessed                                          Exercises Exercises: Other exercises Other Exercises Other Exercises: performed AROM L elbow to hand x 10   Shoulder Instructions       General Comments      Pertinent Vitals/ Pain       Pain Assessment: Faces Faces Pain Scale: Hurts little more Pain Location: L shoulder Pain Descriptors / Indicators: Sore Pain Intervention(s): Monitored during session;Repositioned;Ice applied  Home Living                                          Prior Functioning/Environment              Frequency  Min 2X/week        Progress Toward Goals  OT Goals(current goals can now be found in the care plan section)  Progress towards OT goals: Progressing toward goals  Acute Rehab OT Goals Patient Stated Goal: to be able to pick up my grandkids OT Goal Formulation: With patient Time For Goal Achievement: 12/29/16 Potential to Achieve Goals: Good  Plan Discharge plan remains appropriate    Co-evaluation                 AM-PAC PT "6 Clicks" Daily Activity     Outcome Measure   Help from another person eating meals?: None Help from another person taking care of personal grooming?: None Help from another person toileting, which includes using toliet, bedpan, or urinal?: None Help from another person bathing (including washing, rinsing, drying)?: A Little Help from another person to put on and taking off regular upper body clothing?: A Little Help from another person to put on and taking off regular lower body clothing?: A Little 6 Click Score: 21    End of Session    OT Visit  Diagnosis: Pain Pain - Right/Left: Left Pain - part of body: Shoulder   Activity Tolerance Patient tolerated treatment well   Patient Left in bed;with call bell/phone within reach   Nurse Communication          Time: 8546-2703 OT Time Calculation (min): 30 min  Charges: OT General Charges $OT Visit: 1 Visit OT Treatments $Self Care/Home Management : 23-37 mins  12/23/2016 Nestor Lewandowsky, OTR/L Pager: [place], Haze Boyden 12/23/2016, 10:40 AM

## 2016-12-23 NOTE — Progress Notes (Signed)
Patient discharged instructions given.  Patient verbalized understanding of discharge instructions.  PIV removed and area CDI.  Patient with Sling in place.    Discharge medications  An After Visit Summary was printed and given to the patient.  Discharge vitals  Vitals:   12/23/16 0536 12/23/16 0915  BP: (!) 93/51 (!) 120/54  Pulse: (!) 107 (!) 113  Resp: 18   Temp: 98.6 F (37 C)   SpO2: 91%     Patient discharged via wheelchair with volunteer services.

## 2016-12-23 NOTE — Telephone Encounter (Signed)
Received fax from [facility] stating robaxin still on back order requested something in place of. I submitted baclofen per Dr Forbes Cellar request

## 2016-12-23 NOTE — Telephone Encounter (Signed)
I have submitted info to Otis with [facility] about getting patient arranged for HHPT since she is not wanting to get C[facility] post op.

## 2016-12-23 NOTE — Progress Notes (Signed)
Subjective: Patient stable.   Objective: Vital signs in last 24 hours: Temp:  [98.6 F (37 C)-99 F (37.2 C)] 98.6 F (37 C) (09/13 0536) Pulse Rate:  [101-113] 113 (09/13 0915) Resp:  [18] 18 (09/13 0536) BP: (93-132)/(49-68) 120/54 (09/13 0915) SpO2:  [91 %-95 %] 91 % (09/13 0536)  Intake/Output from previous day: 09/12 0701 - 09/13 0700 In: 8828 [P.O.:600; I.V.:1050] Out: 1000 [Urine:1000] Intake/Output this shift: No intake/output data recorded.  Exam:  Compartment soft  Labs: No results for input(s): HGB in the last 72 hours. No results for input(s): WBC, RBC, HCT, PLT in the last 72 hours. No results for input(s): NA, K, CL, CO2, BUN, CREATININE, GLUCOSE, CALCIUM in the last 72 hours. No results for input(s): LABPT, INR in the last 72 hours.  Assessment/Plan: Plan discharge today   Anderson Malta 12/23/2016, 12:22 PM

## 2016-12-25 DIAGNOSIS — Z471 Aftercare following joint replacement surgery: Secondary | ICD-10-CM | POA: Diagnosis not present

## 2016-12-25 DIAGNOSIS — G5790 Unspecified mononeuropathy of unspecified lower limb: Secondary | ICD-10-CM | POA: Diagnosis not present

## 2016-12-25 DIAGNOSIS — E669 Obesity, unspecified: Secondary | ICD-10-CM | POA: Diagnosis not present

## 2016-12-25 DIAGNOSIS — I10 Essential (primary) hypertension: Secondary | ICD-10-CM | POA: Diagnosis not present

## 2016-12-25 DIAGNOSIS — Z6839 Body mass index (BMI) 39.0-39.9, adult: Secondary | ICD-10-CM | POA: Diagnosis not present

## 2016-12-25 DIAGNOSIS — Z96612 Presence of left artificial shoulder joint: Secondary | ICD-10-CM | POA: Diagnosis not present

## 2016-12-27 ENCOUNTER — Ambulatory Visit (INDEPENDENT_AMBULATORY_CARE_PROVIDER_SITE_OTHER): Payer: Medicare Other | Admitting: Family Medicine

## 2016-12-27 ENCOUNTER — Other Ambulatory Visit (INDEPENDENT_AMBULATORY_CARE_PROVIDER_SITE_OTHER): Payer: Self-pay

## 2016-12-27 ENCOUNTER — Telehealth (INDEPENDENT_AMBULATORY_CARE_PROVIDER_SITE_OTHER): Payer: Self-pay | Admitting: Orthopedic Surgery

## 2016-12-27 VITALS — BP 138/68 | HR 87 | Temp 97.4°F | Resp 16 | Ht 62.0 in | Wt 212.2 lb

## 2016-12-27 DIAGNOSIS — M25512 Pain in left shoulder: Secondary | ICD-10-CM | POA: Diagnosis not present

## 2016-12-27 DIAGNOSIS — I10 Essential (primary) hypertension: Secondary | ICD-10-CM | POA: Diagnosis not present

## 2016-12-27 DIAGNOSIS — R7302 Impaired glucose tolerance (oral): Secondary | ICD-10-CM

## 2016-12-27 DIAGNOSIS — E038 Other specified hypothyroidism: Secondary | ICD-10-CM | POA: Diagnosis not present

## 2016-12-27 DIAGNOSIS — E785 Hyperlipidemia, unspecified: Secondary | ICD-10-CM

## 2016-12-27 DIAGNOSIS — G8929 Other chronic pain: Secondary | ICD-10-CM

## 2016-12-27 NOTE — Telephone Encounter (Signed)
IC verbal given.  

## 2016-12-27 NOTE — Telephone Encounter (Signed)
Victoria Reyes from [facility] called this morning requesting verbal orders for the following:  1x for 1 week 2x for 2 weeks  CB#[phone].  Thank you.

## 2016-12-27 NOTE — Patient Instructions (Addendum)
F/u in 4.5 months, call if you need me before  TSH as soon as possible  Thankful surgery went well  Please work on good  health habits so that your health will improve. 1. Commitment to daily physical activity for 30 to 60  minutes, if you are able to do this.  2. Commitment to wise food choices. Aim for half of your  food intake to be vegetable and fruit, one quarter starchy foods, and one quarter protein. Try to eat on a regular schedule  3 meals per day, snacking between meals should be limited to vegetables or fruits or small portions of nuts. 64 ounces of water per day is generally recommended, unless you have specific health conditions, like heart failure or kidney failure where you will need to limit fluid intake.  3. Commitment to sufficient and a  good quality of physical and mental rest daily, generally between 6 to 8 hours per day.  WITH PERSISTANCE AND PERSEVERANCE, THE IMPOSSIBLE , BECOMES THE NORM!   Reconsider flu vaccine  It is important that you exercise regularly at least 30 minutes 5 times a week. If you develop chest pain, have severe difficulty breathing, or feel very tired, stop exercising immediately and seek medical attention

## 2016-12-28 ENCOUNTER — Telehealth (INDEPENDENT_AMBULATORY_CARE_PROVIDER_SITE_OTHER): Payer: Self-pay

## 2016-12-28 DIAGNOSIS — Z96612 Presence of left artificial shoulder joint: Secondary | ICD-10-CM | POA: Diagnosis not present

## 2016-12-28 DIAGNOSIS — I10 Essential (primary) hypertension: Secondary | ICD-10-CM | POA: Diagnosis not present

## 2016-12-28 DIAGNOSIS — Z6839 Body mass index (BMI) 39.0-39.9, adult: Secondary | ICD-10-CM | POA: Diagnosis not present

## 2016-12-28 DIAGNOSIS — E669 Obesity, unspecified: Secondary | ICD-10-CM | POA: Diagnosis not present

## 2016-12-28 DIAGNOSIS — Z471 Aftercare following joint replacement surgery: Secondary | ICD-10-CM | POA: Diagnosis not present

## 2016-12-28 DIAGNOSIS — G5790 Unspecified mononeuropathy of unspecified lower limb: Secondary | ICD-10-CM | POA: Diagnosis not present

## 2016-12-28 NOTE — Telephone Encounter (Signed)
Victoria Reyes with [facility] called requesting verbal orders for occupation therapy for 2x/wk for 2 wk and 1xwk for 1 wk. Verbal given.

## 2016-12-29 ENCOUNTER — Encounter: Payer: Self-pay | Admitting: Family Medicine

## 2016-12-29 NOTE — Assessment & Plan Note (Signed)
Deteriorated. Patient re-educated about  the importance of commitment to a  minimum of 150 minutes of exercise per week.  The importance of healthy food choices with portion control discussed. Encouraged to start a food diary, count calories and to consider  joining a support group. Sample diet sheets offered. Goals set by the patient for the next several months.   Weight /BMI 12/27/2016 12/21/2016 12/15/2016  WEIGHT 212 lb 4 oz 208 lb 208 lb 9.6 oz  HEIGHT 5\' 2"  5' 1.5" 5' 1.5"  BMI 38.82 kg/m2 38.66 kg/m2 38.78 kg/m2

## 2016-12-29 NOTE — Assessment & Plan Note (Signed)
Hyperlipidemia:Low fat diet discussed and encouraged.   Lipid Panel  Lab Results  Component Value Date   CHOL 201 (H) 07/12/2016   HDL 73 07/12/2016   LDLCALC 109 (H) 07/12/2016   TRIG 93 07/12/2016   CHOLHDL 2.8 07/12/2016   Needs to reduce fried and fatty foods

## 2016-12-29 NOTE — Progress Notes (Signed)
Victoria Reyes     MRN: [id number]      DOB: [birth year]   HPI Victoria Reyes is here for follow up and re-evaluation of chronic medical conditions, medication management and review of any available recent lab and radiology data.  Preventive health is updated, specifically  Cancer screening and Immunization.  Elects to defer flu vaccine Had left shoulder surgery 1 week ago and so far her recovery is as expected, original dressing is still in place The PT denies any adverse reactions to current medications since the last visit.  There are no new concerns.  There are no specific complaints   ROS Denies recent fever or chills. Denies sinus pressure, nasal congestion, ear pain or sore throat. Denies chest congestion, productive cough or wheezing. Denies chest pains, palpitations and leg swelling Denies abdominal pain, nausea, vomiting,diarrhea or constipation.   Denies dysuria, frequency, hesitancy or incontinence.  Denies headaches, seizures, numbness, or tingling. Denies depression, anxiety or insomnia. Denies skin break down or rash.   PE  BP 138/68 (BP Location: Left Arm, Patient Position: Sitting, Cuff Size: Normal)   Pulse 87   Temp (!) 97.4 F (36.3 C) (Other (Comment))   Resp 16   Ht 5\' 2"  (1.575 m)   Wt 212 lb 4 oz (96.3 kg)   SpO2 95%   BMI 38.82 kg/m   Patient alert and oriented and in no cardiopulmonary distress.  HEENT: No facial asymmetry, EOMI,   oropharynx pink and moist.  Neck supple no JVD, no mass.  Chest: Clear to auscultation bilaterally.  CVS: S1, S2 no murmurs, no S3.Regular rate.  ABD: Soft non tender.   Ext: No edema  MS: Adequate ROM spine,  hips and knees.Decreased ROM left shoulder which is in a sling Skin: Intact, no ulcerations or rash noted.  Psych: Good eye contact, normal affect. Memory intact not anxious or depressed appearing.  CNS: CN 2-12 intact, power,  normal throughout.no focal deficits noted.   Assessment & Plan  HTN  (hypertension) Controlled, no change in medication DASH diet and commitment to daily physical activity for a minimum of 30 minutes discussed and encouraged, as a part of hypertension management. The importance of attaining a healthy weight is also discussed.  BP/Weight 12/27/2016 12/23/2016 12/21/2016 12/15/2016 10/18/2016 07/12/2016 04/13/5850  Systolic BP 778 242 - 353 614 431 540  Diastolic BP 68 54 - 76 84 74 58  Wt. (Lbs) 212.25 - 208 208.6 210.04 208 199.12  BMI 38.82 - 38.66 38.78 37.21 36.85 35.27       Obesity Deteriorated. Patient re-educated about  the importance of commitment to a  minimum of 150 minutes of exercise per week.  The importance of healthy food choices with portion control discussed. Encouraged to start a food diary, count calories and to consider  joining a support group. Sample diet sheets offered. Goals set by the patient for the next several months.   Weight /BMI 12/27/2016 12/21/2016 12/15/2016  WEIGHT 212 lb 4 oz 208 lb 208 lb 9.6 oz  HEIGHT 5\' 2"  5' 1.5" 5' 1.5"  BMI 38.82 kg/m2 38.66 kg/m2 38.78 kg/m2      Hypothyroidism Updated lab needed at/ before next visit.   Hyperlipidemia with target LDL less than 100 Hyperlipidemia:Low fat diet discussed and encouraged.   Lipid Panel  Lab Results  Component Value Date   CHOL 201 (H) 07/12/2016   HDL 73 07/12/2016   LDLCALC 109 (H) 07/12/2016   TRIG 93 07/12/2016   CHOLHDL 2.8  07/12/2016   Needs to reduce fried and fatty foods    Shoulder pain, left Improved s/p total replacement  IGT (impaired glucose tolerance) Patient educated about the importance of limiting  Carbohydrate intake , the need to commit to daily physical activity for a minimum of 30 minutes , and to commit weight loss. The fact that changes in all these areas will reduce or eliminate all together the development of diabetes is stressed.  Improved, currently corrected, which is great  Diabetic Labs Latest Ref Rng & Units 12/15/2016  07/12/2016 07/12/2016 12/26/2015 07/02/2015  HbA1c 4.8 - 5.6 % 5.6 - 5.5 5.6 5.7(H)  Chol <200 mg/dL - - 201(H) 164 199  HDL >50 mg/dL - - 73 74 60  Calc LDL <100 mg/dL - - 109(H) 70 114  Triglycerides <150 mg/dL - - 93 101 126  Creatinine 0.44 - 1.00 mg/dL 0.79 0.68 0.68 0.74 0.72   BP/Weight 12/27/2016 12/23/2016 12/21/2016 12/15/2016 10/18/2016 07/12/2016 1/38/8719  Systolic BP 597 471 - 855 015 868 257  Diastolic BP 68 54 - 76 84 74 58  Wt. (Lbs) 212.25 - 208 208.6 210.04 208 199.12  BMI 38.82 - 38.66 38.78 37.21 36.85 35.27   No flowsheet data found.

## 2016-12-29 NOTE — Assessment & Plan Note (Signed)
Patient educated about the importance of limiting  Carbohydrate intake , the need to commit to daily physical activity for a minimum of 30 minutes , and to commit weight loss. The fact that changes in all these areas will reduce or eliminate all together the development of diabetes is stressed.  Improved, currently corrected, which is great  Diabetic Labs Latest Ref Rng & Units 12/15/2016 07/12/2016 07/12/2016 12/26/2015 07/02/2015  HbA1c 4.8 - 5.6 % 5.6 - 5.5 5.6 5.7(H)  Chol <200 mg/dL - - 201(H) 164 199  HDL >50 mg/dL - - 73 74 60  Calc LDL <100 mg/dL - - 109(H) 70 114  Triglycerides <150 mg/dL - - 93 101 126  Creatinine 0.44 - 1.00 mg/dL 0.79 0.68 0.68 0.74 0.72   BP/Weight 12/27/2016 12/23/2016 12/21/2016 12/15/2016 10/18/2016 07/12/2016 3/79/0240  Systolic BP 973 532 - 992 426 834 196  Diastolic BP 68 54 - 76 84 74 58  Wt. (Lbs) 212.25 - 208 208.6 210.04 208 199.12  BMI 38.82 - 38.66 38.78 37.21 36.85 35.27   No flowsheet data found.

## 2016-12-29 NOTE — Assessment & Plan Note (Signed)
Updated lab needed at/ before next visit.   

## 2016-12-29 NOTE — Assessment & Plan Note (Signed)
Controlled, no change in medication DASH diet and commitment to daily physical activity for a minimum of 30 minutes discussed and encouraged, as a part of hypertension management. The importance of attaining a healthy weight is also discussed.  BP/Weight 12/27/2016 12/23/2016 12/21/2016 12/15/2016 10/18/2016 07/12/2016 4/51/4604  Systolic BP 799 872 - 158 727 618 485  Diastolic BP 68 54 - 76 84 74 58  Wt. (Lbs) 212.25 - 208 208.6 210.04 208 199.12  BMI 38.82 - 38.66 38.78 37.21 36.85 35.27

## 2016-12-29 NOTE — Assessment & Plan Note (Signed)
Improved s/p total replacement

## 2016-12-30 DIAGNOSIS — E669 Obesity, unspecified: Secondary | ICD-10-CM | POA: Diagnosis not present

## 2016-12-30 DIAGNOSIS — Z96612 Presence of left artificial shoulder joint: Secondary | ICD-10-CM | POA: Diagnosis not present

## 2016-12-30 DIAGNOSIS — Z471 Aftercare following joint replacement surgery: Secondary | ICD-10-CM | POA: Diagnosis not present

## 2016-12-30 DIAGNOSIS — G5790 Unspecified mononeuropathy of unspecified lower limb: Secondary | ICD-10-CM | POA: Diagnosis not present

## 2016-12-30 DIAGNOSIS — I10 Essential (primary) hypertension: Secondary | ICD-10-CM | POA: Diagnosis not present

## 2016-12-30 DIAGNOSIS — Z6839 Body mass index (BMI) 39.0-39.9, adult: Secondary | ICD-10-CM | POA: Diagnosis not present

## 2016-12-31 DIAGNOSIS — G5790 Unspecified mononeuropathy of unspecified lower limb: Secondary | ICD-10-CM | POA: Diagnosis not present

## 2016-12-31 DIAGNOSIS — Z6839 Body mass index (BMI) 39.0-39.9, adult: Secondary | ICD-10-CM | POA: Diagnosis not present

## 2016-12-31 DIAGNOSIS — Z471 Aftercare following joint replacement surgery: Secondary | ICD-10-CM | POA: Diagnosis not present

## 2016-12-31 DIAGNOSIS — Z96612 Presence of left artificial shoulder joint: Secondary | ICD-10-CM | POA: Diagnosis not present

## 2016-12-31 DIAGNOSIS — I10 Essential (primary) hypertension: Secondary | ICD-10-CM | POA: Diagnosis not present

## 2016-12-31 DIAGNOSIS — E669 Obesity, unspecified: Secondary | ICD-10-CM | POA: Diagnosis not present

## 2017-01-02 NOTE — Discharge Summary (Signed)
Physician Discharge Summary  Patient ID: Victoria Reyes MRN: [id number] DOB/AGE: 1950-11-09 66 y.o.  Admit date: 12/21/2016 Discharge date: 12/23/2016  Admission Diagnoses:  Active Problems:   * No active hospital problems. *   Discharge Diagnoses:  Same  Surgeries: Procedure(s): TOTAL SHOULDER ARTHROPLASTY on 12/21/2016   Consultants:   Discharged Condition: Stable  Hospital Course: Victoria Reyes is an 66 y.o. female who was admitted 12/21/2016 with a chief complaint of shoulder pain, and found to have a diagnosis of shoulder arthritis.  They were brought to the operating room on 12/21/2016 and underwent the above named procedures.  Patient tolerated the procedure well.  She was started with occupational therapy for range of motion on postoperative day #1.  She is discharged to home in good condition on postoperative day #2.  She will continue with home health physical therapy and follow up with me in a week.  Appropriate pain medicine prescribed.  Antibiotics given:  Anti-infectives    Start     Dose/Rate Route Frequency Ordered Stop   12/21/16 2100  ceFAZolin (ANCEF) IVPB 2g/100 mL premix     2 g 200 mL/hr over 30 Minutes Intravenous Every 6 hours 12/21/16 2017 12/22/16 0235   12/21/16 1545  vancomycin (VANCOCIN) powder  Status:  Discontinued       As needed 12/21/16 1545 12/21/16 1621   12/21/16 0853  ceFAZolin (ANCEF) 2-4 GM/100ML-% IVPB    Comments:  Scronce, Trina   : cabinet override      12/21/16 0853 12/21/16 1146   12/21/16 0843  ceFAZolin (ANCEF) IVPB 2g/100 mL premix     2 g 200 mL/hr over 30 Minutes Intravenous On call to O.R. 12/21/16 8841 12/21/16 1548    .  Recent vital signs:  Vitals:   12/23/16 0536 12/23/16 0915  BP: (!) 93/51 (!) 120/54  Pulse: (!) 107 (!) 113  Resp: 18   Temp: 98.6 F (37 C)   SpO2: 91%     Recent laboratory studies:  Results for orders placed or performed during the hospital encounter of 12/21/16  Glucose, capillary   Result Value Ref Range   Glucose-Capillary 86 65 - 99 mg/dL   Comment 1 Notify RN    Comment 2 Document in Chart     Discharge Medications:   Allergies as of 12/23/2016      Reactions   Hydrocodone    REACTION: Nausea   Sulfa Antibiotics Itching      Medication List    STOP taking these medications   ciprofloxacin 500 MG tablet Commonly known as:  CIPRO   nitrofurantoin (macrocrystal-monohydrate) 100 MG capsule Commonly known as:  MACROBID   NONFORMULARY OR COMPOUNDED ITEM     TAKE these medications   amLODipine 5 MG tablet Commonly known as:  NORVASC TAKE 1 TABLET (5 MG TOTAL) BY MOUTH DAILY.   docusate sodium 100 MG capsule Commonly known as:  COLACE Take 1 capsule (100 mg total) by mouth 2 (two) times daily.   DUREZOL 0.05 % Emul Generic drug:  Difluprednate Place 1 drop into the left eye 2 (two) times daily.   HYDROmorphone 2 MG tablet Commonly known as:  DILAUDID Take 1 tablet (2 mg total) by mouth every 6 (six) hours as needed for severe pain.   levothyroxine 100 MCG tablet Commonly known as:  SYNTHROID, LEVOTHROID TAKE 1 TABLET BY MOUTH EVERY DAY BEFORE BREAKFAST, EXCEPT SATURDAY AND SUNDAY ONLY TAKE 1/2 TABLET   methocarbamol 500 MG tablet Commonly known as:  ROBAXIN Take 1 tablet (500 mg total) by mouth every 6 (six) hours as needed for muscle spasms.   omeprazole 40 MG capsule Commonly known as:  PRILOSEC TAKE 1 CAPSULE BY MOUTH DAILY.   Vitamin D (Ergocalciferol) 2000 units Caps Take 1 capsule by mouth daily.            Discharge Care Instructions        Start     Ordered   12/23/16 0000  docusate sodium (COLACE) 100 MG capsule  2 times daily     12/23/16 1225   12/23/16 0000  methocarbamol (ROBAXIN) 500 MG tablet  Every 6 hours PRN     12/23/16 1225   12/23/16 0000  HYDROmorphone (DILAUDID) 2 MG tablet  Every 6 hours PRN     12/23/16 1225   12/23/16 0000  Call MD / Call 911    Comments:  If you experience chest pain or shortness  of breath, CALL 911 and be transported to the hospital emergency room.  If you develope a fever above 101 F, pus (white drainage) or increased drainage or redness at the wound, or calf pain, call your surgeon's office.   12/23/16 1225   12/23/16 0000  Diet - low sodium heart healthy     12/23/16 1225   12/23/16 0000  Constipation Prevention    Comments:  Drink plenty of fluids.  Prune juice may be helpful.  You may use a stool softener, such as Colace (over the counter) 100 mg twice a day.  Use MiraLax (over the counter) for constipation as needed.   12/23/16 1225   12/23/16 0000  Increase activity slowly as tolerated     12/23/16 1225   12/23/16 0000  Discharge instructions    Comments:  no lifting with left arm Okay for range of motion left arm in the machines Stay in sling for comfort Okay to shower dressings waterproof   12/23/16 1225      Diagnostic Studies: Dg Chest 2 View  Result Date: 12/15/2016 CLINICAL DATA:  Preoperative examination prior to shoulder replacement. History of hypertension, former smoker. EXAM: CHEST  2 VIEW COMPARISON:  Chest x-ray of July 20, 2011 FINDINGS: The lungs are adequately inflated. There is no focal infiltrate. There is no pleural effusion. The interstitial markings are coarse but stable. The heart and pulmonary vascularity are normal. The mediastinum is normal in width. The trachea is midline. The bony thorax exhibits no acute abnormality. IMPRESSION: Mild bronchitic-smoking related changes, stable. There is no acute cardiopulmonary abnormality. Electronically Signed   By: David  Martinique M.D.   On: 12/15/2016 15:16   Dg Shoulder 1v Left  Result Date: 12/21/2016 CLINICAL DATA:  Follow-up examination status post left shoulder arthroplasty. EXAM: LEFT SHOULDER - 1 VIEW COMPARISON:  Prior CT from 11/29/2016. FINDINGS: Postoperative changes from recent placement of a left total shoulder arthroplasty. Prosthetic components appear well seated and positioned. No  periprosthetic fracture or other complication. Postoperative soft tissue swelling and emphysema present about the left shoulder. Atelectatic changes noted within the visualized lung. IMPRESSION: Sequelae of recent left shoulder arthroplasty without complication. Electronically Signed   By: Jeannine Boga M.D.   On: 12/21/2016 19:36    Disposition: 01-Home or Self Care  Discharge Instructions    Call MD / Call 911    Complete by:  As directed    If you experience chest pain or shortness of breath, CALL 911 and be transported to the hospital emergency room.  If you develope  a fever above 101 F, pus (white drainage) or increased drainage or redness at the wound, or calf pain, call your surgeon's office.   Constipation Prevention    Complete by:  As directed    Drink plenty of fluids.  Prune juice may be helpful.  You may use a stool softener, such as Colace (over the counter) 100 mg twice a day.  Use MiraLax (over the counter) for constipation as needed.   Diet - low sodium heart healthy    Complete by:  As directed    Discharge instructions    Complete by:  As directed    no lifting with left arm Okay for range of motion left arm in the machines Stay in sling for comfort Okay to shower dressings waterproof   Increase activity slowly as tolerated    Complete by:  As directed       Follow-up Information    [facility] up.   Specialty:  [place] Why:  Home health services arranged, office will call and set up home visits Contact information: [address] [place] [address] [phone]            Signed: Anderson Malta 01/02/2017, 8:49 AM

## 2017-01-03 ENCOUNTER — Ambulatory Visit (INDEPENDENT_AMBULATORY_CARE_PROVIDER_SITE_OTHER): Payer: Medicare Other | Admitting: Orthopedic Surgery

## 2017-01-03 ENCOUNTER — Encounter (INDEPENDENT_AMBULATORY_CARE_PROVIDER_SITE_OTHER): Payer: Self-pay | Admitting: Orthopedic Surgery

## 2017-01-03 ENCOUNTER — Ambulatory Visit (INDEPENDENT_AMBULATORY_CARE_PROVIDER_SITE_OTHER): Payer: Medicare Other

## 2017-01-03 DIAGNOSIS — Z96612 Presence of left artificial shoulder joint: Secondary | ICD-10-CM

## 2017-01-03 MED ORDER — HYDROMORPHONE HCL 2 MG PO TABS: 2.0000 mg | ORAL_TABLET | Freq: Four times a day (QID) | ORAL | 0 refills | Status: DC | PRN

## 2017-01-03 MED ORDER — BACLOFEN 10 MG PO TABS: ORAL_TABLET | ORAL | 0 refills | Status: DC

## 2017-01-04 DIAGNOSIS — I10 Essential (primary) hypertension: Secondary | ICD-10-CM | POA: Diagnosis not present

## 2017-01-04 DIAGNOSIS — Z471 Aftercare following joint replacement surgery: Secondary | ICD-10-CM | POA: Diagnosis not present

## 2017-01-04 DIAGNOSIS — E669 Obesity, unspecified: Secondary | ICD-10-CM | POA: Diagnosis not present

## 2017-01-04 DIAGNOSIS — Z6839 Body mass index (BMI) 39.0-39.9, adult: Secondary | ICD-10-CM | POA: Diagnosis not present

## 2017-01-04 DIAGNOSIS — G5790 Unspecified mononeuropathy of unspecified lower limb: Secondary | ICD-10-CM | POA: Diagnosis not present

## 2017-01-04 DIAGNOSIS — Z96612 Presence of left artificial shoulder joint: Secondary | ICD-10-CM | POA: Diagnosis not present

## 2017-01-04 NOTE — Progress Notes (Signed)
Post-Op Visit Note   Patient: Victoria Reyes           Date of Birth: [birth year]           MRN: [id number] Visit Date: 01/03/2017 PCP: Fayrene Helper, MD   Assessment & Plan:  Chief Complaint:  Chief Complaint  Patient presents with  . Left Shoulder - Routine Post Op   Visit Diagnoses:  1. Status post replacement of left shoulder joint     Plan: Bellamy is a 66 year old patient with left shoulder replacement performed 12/21/2016.  She's been wearing the sling.  On examination her deltoid fires.  Shoulder is predictably stiff.  I'm going to refill her Dilaudid start her in physical therapy refill baclofen follow-up in 3 weeks for clinical recheck  Follow-Up Instructions: Return in about 3 weeks (around 01/24/2017).   Orders:  Orders Placed This Encounter  Procedures  . XR Shoulder Left  . Ambulatory referral to Physical Therapy   Meds ordered this encounter  Medications  . baclofen (LIORESAL) 10 MG tablet    Sig: 1 po bid prn    Dispense:  35 each    Refill:  0  . HYDROmorphone (DILAUDID) 2 MG tablet    Sig: Take 1 tablet (2 mg total) by mouth every 6 (six) hours as needed for severe pain.    Dispense:  60 tablet    Refill:  0    Imaging: Xr Shoulder Left  Result Date: 01/04/2017 APand outlet view left shoulder reviewed.  Total shoulder prosthesis in good position and alignment with no complicating features.  No fracture or dislocation present.   PMFS History: Patient Active Problem List   Diagnosis Date Noted  . Neck pain on left side 01/20/2016  . Shoulder pain, left 11/26/2015  . Cutaneous wart 09/30/2014  . HTN (hypertension) 01/01/2013  . Hyperlipidemia with target LDL less than 100 05/04/2012  . IGT (impaired glucose tolerance) 03/14/2011  . GERD (gastroesophageal reflux disease) 03/14/2011  . BACK PAIN, LUMBAR, CHRONIC 06/25/2009  . Obesity 02/17/2007  . Hypothyroidism 06/14/2006  . DIVERTICULOSIS, COLON 03/01/2006  . HIATAL HERNIA WITH  REFLUX, HX OF 03/01/2006   Past Medical History:  Diagnosis Date  . Anemia    as a  young woman  . Diverticulitis 2005   CT   . DJD (degenerative joint disease)   . GERD (gastroesophageal reflux disease)   . Headache(784.0)   . Hiatal hernia   . Hyperlipidemia   . Hypertension   . Hypothyroidism   . Knee pain, right   . Low back pain   . Neuropathy    peroneal nerve  . Obesity   . Pneumonia    as child  . Pre-diabetes   . S/P colonoscopy 2005   Dr. Tamala Julian: sigmoid diverticulosis  . Thyroid disease   . Tobacco abuse     Family History  Problem Relation Age of Onset  . Heart attack Sister        pacemaker  . Kidney failure Sister        on dialysis  . Colon cancer Neg Hx     Past Surgical History:  Procedure Laterality Date  . BACK SURGERY    . COLONOSCOPY  12/18/2010   Procedure: COLONOSCOPY;  Surgeon: Dorothyann Peng, MD;  [facility];  Service: Endoscopy;  Laterality: N/A;  10:15am  . cyst removal right wrist    . EYE SURGERY Bilateral    cataract surgery with lens implant  .  TONSILLECTOMY    . TOTAL SHOULDER ARTHROPLASTY Left 12/21/2016   Procedure: TOTAL SHOULDER ARTHROPLASTY;  Surgeon: Meredith Pel, MD;  Location: [facility];  Service: Orthopedics;  Laterality: Left;  . TUBAL LIGATION    . umbillical hernia     Social History   Occupational History  . Not on file.   Social History Main Topics  . Smoking status: Former Smoker    Packs/day: 0.25    Years: 45.00    Types: Cigarettes    Start date: 09/30/1963    Quit date: 08/15/2015  . Smokeless tobacco: Never Used  . Alcohol use No  . Drug use: No  . Sexual activity: Not Currently    Birth control/ protection: Post-menopausal

## 2017-01-04 NOTE — Addendum Note (Signed)
Addended byLaurann Montana on: 01/04/2017 12:44 PM   Modules accepted: Orders

## 2017-01-12 ENCOUNTER — Encounter (HOSPITAL_COMMUNITY): Payer: Self-pay | Admitting: Occupational Therapy

## 2017-01-12 ENCOUNTER — Ambulatory Visit (HOSPITAL_COMMUNITY): Payer: Medicare Other | Attending: Orthopedic Surgery | Admitting: Occupational Therapy

## 2017-01-12 DIAGNOSIS — M25512 Pain in left shoulder: Secondary | ICD-10-CM | POA: Diagnosis not present

## 2017-01-12 DIAGNOSIS — R29898 Other symptoms and signs involving the musculoskeletal system: Secondary | ICD-10-CM

## 2017-01-12 DIAGNOSIS — M25612 Stiffness of left shoulder, not elsewhere classified: Secondary | ICD-10-CM | POA: Insufficient documentation

## 2017-01-12 NOTE — Patient Instructions (Signed)
SHOULDER: Flexion On Table   Place hands on table, elbows straight. Move hips away from body. Press hands down into table.  _10-15__ reps per set, __3_ sets per day  Abduction (Passive)   With arm out to side, resting on table, lower head toward arm, keeping trunk away from table. Repeat __10-15__ times. Do __3__ sessions per day.  Copyright  VHI. All rights reserved.     Internal Rotation (Assistive)   Seated with elbow bent at right angle and held against side, slide arm on table surface in an inward arc. Repeat __10-15__ times. Do __3__ sessions per day. Activity: Use this motion to brush crumbs off the table.  Copyright  VHI. All rights reserved.

## 2017-01-12 NOTE — Therapy (Signed)
[place], [place], [address] Phone: [phone]   Fax:  [phone]  Occupational Therapy Evaluation  Patient Details  Name: Victoria Reyes MRN: [id number] Date of Birth: [birth year] Referring Provider: Dr. Meredith Pel  Encounter Date: 01/12/2017      OT End of Session - 01/12/17 1722    Visit Number 1   Number of Visits 24   Date for OT Re-Evaluation 03/13/17  mini-reassessment on 02/10/17   Authorization Type Medicare A & B   Authorization Time Period Before 10th visit   Authorization - Visit Number 1   Authorization - Number of Visits 10   OT Start Time 0932   OT Stop Time 1717   OT Time Calculation (min) 30 min   Activity Tolerance Patient tolerated treatment well   Behavior During Therapy [facility] for tasks assessed/performed      Past Medical History:  Diagnosis Date  . Anemia    as a  young woman  . Diverticulitis 2005   CT   . DJD (degenerative joint disease)   . GERD (gastroesophageal reflux disease)   . Headache(784.0)   . Hiatal hernia   . Hyperlipidemia   . Hypertension   . Hypothyroidism   . Knee pain, right   . Low back pain   . Neuropathy    peroneal nerve  . Obesity   . Pneumonia    as child  . Pre-diabetes   . S/P colonoscopy 2005   Dr. Tamala Julian: sigmoid diverticulosis  . Thyroid disease   . Tobacco abuse     Past Surgical History:  Procedure Laterality Date  . BACK SURGERY    . COLONOSCOPY  12/18/2010   Procedure: COLONOSCOPY;  Surgeon: Dorothyann Peng, MD;  [facility];  Service: Endoscopy;  Laterality: N/A;  10:15am  . cyst removal right wrist    . EYE SURGERY Bilateral    cataract surgery with lens implant  . TONSILLECTOMY    . TOTAL SHOULDER ARTHROPLASTY Left 12/21/2016   Procedure: TOTAL SHOULDER ARTHROPLASTY;  Surgeon: Meredith Pel, MD;  Location: [facility];  Service: Orthopedics;  Laterality: Left;  . TUBAL LIGATION    . umbillical hernia       There were no vitals filed for this visit.      Subjective Assessment - 01/12/17 1707    Subjective  S: I'm ready to get back to driving.    Pertinent History Pt is a 66 y/o female with hx of left shoulder pain, presenting to therapy s/p left TSA on 12/21/16. Pt is using pain medication and ice for pain management, pt reports she does not have to wear a sling. Dr. Meredith Pel referred pt to occupational therapy for evaluation and treatment.    Special Tests FOTO Score: 24/100 (76% impairment)   Patient Stated Goals To be able to use my left arm.    Currently in Pain? No/denies           [facility] OT Assessment - 01/12/17 1645      Assessment   Diagnosis Left TSA   Referring Provider Dr. Meredith Pel   Onset Date 12/21/16   Prior Therapy Acute Care; [facility]     Precautions   Precautions Shoulder   Type of Shoulder Precautions ROM and strengthening; avoid subscapular strengthening for 3 weeks (9/21-10/12)   Precaution Comments No lifting with weight     Balance Screen   Has the patient fallen  in the past 6 months No   Has the patient had a decrease in activity level because of a fear of falling?  No   Is the patient reluctant to leave their home because of a fear of falling?  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      ADL   ADL comments Pt is having difficulty all ADL completion: dressing, grooming-washing/fixing hair, reaching up and overhead for objects, lifting and holding weighted objects     Written Expression   Dominant Hand Right     Cognition   Overall Cognitive Status Within Functional Limits for tasks assessed     ROM / Strength   AROM / PROM / Strength AROM;PROM;Strength     Palpation   Palpation comment Max fascial  restrictions in left dorsal and volar forearm, upper arm, trapezius, and scapularis regions     AROM   Overall AROM  Deficits;Unable to assess;Due to precautions   AROM Assessment Site Shoulder   Right/Left Shoulder Left     PROM   Overall PROM Comments Assessed supine, er/IR adducted   PROM Assessment Site Shoulder   Right/Left Shoulder Left   Left Shoulder Flexion 80 Degrees   Left Shoulder ABduction 58 Degrees   Left Shoulder Internal Rotation 90 Degrees   Left Shoulder External Rotation 28 Degrees     Strength   Overall Strength Unable to assess;Due to precautions   Strength Assessment Site Shoulder   Right/Left Shoulder Left                         OT Education - 01/12/17 1706    Education provided Yes   Education Details table slides   Person(s) Educated Patient   Methods Explanation;Demonstration;Handout   Comprehension Verbalized understanding;Returned demonstration          OT Short Term Goals - 01/12/17 1727      OT SHORT TERM GOAL #1   Title Pt will be provided with and educated on HEP.    Time 4   Period Weeks   Status New   Target Date 02/09/17     OT SHORT TERM GOAL #2   Title Pt will decrease LUE pain to 5/10 or less to improve ability to use LUE as assist during dressing tasks.    Time 4   Period Weeks   Status New     OT SHORT TERM GOAL #3   Title Pt will decrease fascial restrictions in LUE from max to moderate amounts to improve mobility in LUE during daily tasks.    Time 4   Period Weeks   Status New     OT SHORT TERM GOAL #4   Title Pt will improve P/ROM of LUE to [facility] to increase ability to complete ADL tasks at waist level.    Time 4   Period Weeks   Status New           OT Long Term Goals - 01/12/17 1729      OT LONG TERM GOAL #1   Title Pt will return to highest level of functioning and independence in B/ADL completion using LUE as non-dominant.    Time 8   Period Weeks  Status New   Target Date  03/11/17     OT LONG TERM GOAL #2   Title Pt will decrease LUE fascial restrictions from mod to minimal amounts or less to improve mobility required for functional reaching tasks.    Time 8   Period Weeks   Status New     OT LONG TERM GOAL #3   Title Pt will decrease LUE pain to 3/10 or less to improve use of LUE as an assist during B/ADL completion.    Time 8   Period Weeks   Status New     OT LONG TERM GOAL #4   Title Pt will increase LUE A/ROM to [facility] to improve ability to reach up and perform grooming tasks.    Time 8   Period Weeks   Status New     OT LONG TERM GOAL #5   Title Pt will improve LUE strength to 4/5 to improve ability to perform work tasks using LUE as non-dominant.   Time 8   Period Weeks   Status New               Plan - 02/11/2017 1723    Clinical Impression Statement A: Pt is a 66 y/o female s/p left TSA on 12/21/16 presenting with limited ability to use LUE for functional task completion. Pt reports no pain however during passive ROM measurements and table slides, grimacing and guarding noted.    Occupational Profile and client history currently impacting functional performance Pt was independent prior to surgery and is highly motivated to return to prior level of independence; is also planning to come out of retirement and return to work   Occupational performance deficits (Please refer to evaluation for details): ADL's;IADL's;Rest and Sleep;Work;Leisure   Rehab Potential Good   OT Frequency 3x / week   OT Duration 8 weeks   OT Treatment/Interventions Self-care/ADL training;Therapeutic exercise;Patient/family education;Manual Therapy;Ultrasound;Therapeutic activities;Cryotherapy;Electrical Stimulation;Moist Heat;Passive range of motion   Plan P: Pt will benefit from skilled OT services to decrease pain and fascial restrictions, increase ROM, strength, and functional use of LUE as non-dominant. Treatment plan: myofascial release, manual therapy, P/ROM,  AA/ROM, A/ROM, scapular stability and strengthening, general LUE strengthening, modalities prn   Clinical Decision Making Limited treatment options, no task modification necessary   OT Home Exercise Plan Feb 12, 2023: table slides   Consulted and Agree with Plan of Care Patient      Patient will benefit from skilled therapeutic intervention in order to improve the following deficits and impairments:  Decreased strength, Decreased activity tolerance, Decreased mobility, Decreased range of motion, Pain, Impaired UE functional use, Increased fascial restricitons  Visit Diagnosis: Acute pain of left shoulder  Stiffness of left shoulder, not elsewhere classified  Other symptoms and signs involving the musculoskeletal system      G-Codes - 2017-02-11 1731    Functional Assessment Tool Used (Outpatient only) FOTO Score: 24/100 (76% impairment)   Functional Limitation Carrying, moving and handling objects   Carrying, Moving and Handling Objects Current Status (N2778) At least 60 percent but less than 80 percent impaired, limited or restricted   Carrying, Moving and Handling Objects Goal Status (E4235) At least 20 percent but less than 40 percent impaired, limited or restricted      Problem List Patient Active Problem List   Diagnosis Date Noted  . Neck pain on left side 01/20/2016  . Shoulder pain, left 11/26/2015  . Cutaneous wart 09/30/2014  . HTN (hypertension) 01/01/2013  . Hyperlipidemia with target  LDL less than 100 05/04/2012  . IGT (impaired glucose tolerance) 03/14/2011  . GERD (gastroesophageal reflux disease) 03/14/2011  . BACK PAIN, LUMBAR, CHRONIC 06/25/2009  . Obesity 02/17/2007  . Hypothyroidism 06/14/2006  . DIVERTICULOSIS, COLON 03/01/2006  . HIATAL HERNIA WITH REFLUX, HX OF 03/01/2006   Guadelupe Sabin, OTR/L  [phone] 01/12/2017, 5:37 PM  [place] [address] [place], [place], [address] Phone: [phone]   Fax:   [phone]  Name: SMITH MCNICHOLAS MRN: [id number] Date of Birth: 1951-03-21

## 2017-01-14 ENCOUNTER — Encounter (HOSPITAL_COMMUNITY): Payer: Self-pay | Admitting: Occupational Therapy

## 2017-01-14 ENCOUNTER — Ambulatory Visit (HOSPITAL_COMMUNITY): Payer: Medicare Other | Admitting: Occupational Therapy

## 2017-01-14 DIAGNOSIS — M25612 Stiffness of left shoulder, not elsewhere classified: Secondary | ICD-10-CM | POA: Diagnosis not present

## 2017-01-14 DIAGNOSIS — M25512 Pain in left shoulder: Secondary | ICD-10-CM

## 2017-01-14 DIAGNOSIS — R29898 Other symptoms and signs involving the musculoskeletal system: Secondary | ICD-10-CM

## 2017-01-14 NOTE — Therapy (Signed)
[place], [place], [address] Phone: [phone]   Fax:  [phone]  Occupational Therapy Treatment  Patient Details  Name: Victoria Reyes MRN: [id number] Date of Birth: [birth year] Referring Provider: Dr. Meredith Pel  Encounter Date: 01/14/2017      OT End of Session - 01/14/17 1155    Visit Number 2   Number of Visits 24   Date for OT Re-Evaluation 03/13/17  mini-reassessment on 02/10/17   Authorization Type Medicare A & B   Authorization Time Period Before 10th visit   Authorization - Visit Number 2   Authorization - Number of Visits 10   OT Start Time 1115   OT Stop Time 1155   OT Time Calculation (min) 40 min   Activity Tolerance Patient tolerated treatment well   Behavior During Therapy [facility] for tasks assessed/performed      Past Medical History:  Diagnosis Date  . Anemia    as a  young woman  . Diverticulitis 2005   CT   . DJD (degenerative joint disease)   . GERD (gastroesophageal reflux disease)   . Headache(784.0)   . Hiatal hernia   . Hyperlipidemia   . Hypertension   . Hypothyroidism   . Knee pain, right   . Low back pain   . Neuropathy    peroneal nerve  . Obesity   . Pneumonia    as child  . Pre-diabetes   . S/P colonoscopy 2005   Dr. Tamala Julian: sigmoid diverticulosis  . Thyroid disease   . Tobacco abuse     Past Surgical History:  Procedure Laterality Date  . BACK SURGERY    . COLONOSCOPY  12/18/2010   Procedure: COLONOSCOPY;  Surgeon: Dorothyann Peng, MD;  [facility];  Service: Endoscopy;  Laterality: N/A;  10:15am  . cyst removal right wrist    . EYE SURGERY Bilateral    cataract surgery with lens implant  . TONSILLECTOMY    . TOTAL SHOULDER ARTHROPLASTY Left 12/21/2016   Procedure: TOTAL SHOULDER ARTHROPLASTY;  Surgeon: Meredith Pel, MD;  Location: [facility];  Service: Orthopedics;  Laterality: Left;  . TUBAL LIGATION    . umbillical hernia       There were no vitals filed for this visit.      Subjective Assessment - 01/14/17 1115    Subjective  S: It's only been hurting if something touches it.    Currently in Pain? No/denies            [facility] OT Assessment - 01/14/17 1114      Assessment   Diagnosis Left TSA     Precautions   Precautions Shoulder   Type of Shoulder Precautions ROM and strengthening; avoid subscapular strengthening for 3 weeks (9/21-10/12)   Precaution Comments No lifting with weight                  OT Treatments/Exercises (OP) - 01/14/17 1117      Exercises   Exercises Shoulder     Shoulder Exercises: Supine   Protraction PROM;10 reps   Horizontal ABduction PROM;10 reps   External Rotation PROM;10 reps   Internal Rotation PROM;10 reps   Flexion PROM;10 reps   ABduction PROM;10 reps     Shoulder Exercises: Seated   Elevation AROM;10 reps   Extension AROM;10 reps   Row AROM;10 reps   Other Seated Exercises Elbow flexion/extension, A/ROM, 10X   Other Seated Exercises Wrist flexion/extension,  A/ROM, 10X     Shoulder Exercises: Therapy Ball   Flexion 10 reps   ABduction 10 reps     Shoulder Exercises: Isometric Strengthening   Flexion Supine;3X3"   Extension Supine;3X3"   External Rotation Supine;3X3"   Internal Rotation Supine;3X3"   ABduction Supine;3X3"   ADduction Supine;3X3"     Manual Therapy   Manual Therapy Myofascial release   Manual therapy comments completed separately from therapeutic exercise   Myofascial Release Myofascial release to left upper arm, trapezius, and scapularis regions to decrease pain and fascial restrictions and increase joint range of motion.                 OT Education - 01/14/17 1147    Education provided Yes   Education Details scapular A/ROM   Person(s) Educated Patient   Methods Explanation;Demonstration;Handout   Comprehension Verbalized understanding;Returned demonstration          OT Short Term Goals -  01/14/17 1147      OT SHORT TERM GOAL #1   Title Pt will be provided with and educated on HEP.    Time 4   Period Weeks   Status On-going     OT SHORT TERM GOAL #2   Title Pt will decrease LUE pain to 5/10 or less to improve ability to use LUE as assist during dressing tasks.    Time 4   Period Weeks   Status On-going     OT SHORT TERM GOAL #3   Title Pt will decrease fascial restrictions in LUE from max to moderate amounts to improve mobility in LUE during daily tasks.    Time 4   Period Weeks   Status On-going     OT SHORT TERM GOAL #4   Title Pt will improve P/ROM of LUE to [facility] to increase ability to complete ADL tasks at waist level.    Time 4   Period Weeks   Status On-going     OT SHORT TERM GOAL #5   Status On-going           OT Long Term Goals - 01/14/17 1148      OT LONG TERM GOAL #1   Title Pt will return to highest level of functioning and independence in B/ADL completion using LUE as non-dominant.    Time 8   Period Weeks   Status On-going     OT LONG TERM GOAL #2   Title Pt will decrease LUE fascial restrictions from mod to minimal amounts or less to improve mobility required for functional reaching tasks.    Time 8   Period Weeks   Status On-going     OT LONG TERM GOAL #3   Title Pt will decrease LUE pain to 3/10 or less to improve use of LUE as an assist during B/ADL completion.    Time 8   Period Weeks   Status On-going     OT LONG TERM GOAL #4   Title Pt will increase LUE A/ROM to [facility] to improve ability to reach up and perform grooming tasks.    Time 8   Period Weeks   Status On-going     OT LONG TERM GOAL #5   Title Pt will improve LUE strength to 4/5 to improve ability to perform work tasks using LUE as non-dominant.   Time 8   Period Weeks   Status On-going               Plan - 01/14/17  1155    Clinical Impression Statement A: Initiated myofascial release, P/ROM, isometrics, scapular A/ROM, and therapy ball stretches this  session. Pt reports increased pain with passive stretching however was able to relax arm to allow ROM. Provided pt with scapular A/ROM HEP and evaluation, reviewed goals.    Plan P: follow up on HEP. Add myofascial release to left wrist and forearm. Continue working to improve P/ROM within pain tolerance   OT Home Exercise Plan 10/3: table slides; 10/5: scapular A/ROM   Consulted and Agree with Plan of Care Patient      Patient will benefit from skilled therapeutic intervention in order to improve the following deficits and impairments:  Decreased strength, Decreased activity tolerance, Decreased mobility, Decreased range of motion, Pain, Impaired UE functional use, Increased fascial restricitons  Visit Diagnosis: Acute pain of left shoulder  Stiffness of left shoulder, not elsewhere classified  Other symptoms and signs involving the musculoskeletal system    Problem List Patient Active Problem List   Diagnosis Date Noted  . Neck pain on left side 01/20/2016  . Shoulder pain, left 11/26/2015  . Cutaneous wart 09/30/2014  . HTN (hypertension) 01/01/2013  . Hyperlipidemia with target LDL less than 100 05/04/2012  . IGT (impaired glucose tolerance) 03/14/2011  . GERD (gastroesophageal reflux disease) 03/14/2011  . BACK PAIN, LUMBAR, CHRONIC 06/25/2009  . Obesity 02/17/2007  . Hypothyroidism 06/14/2006  . DIVERTICULOSIS, COLON 03/01/2006  . HIATAL HERNIA WITH REFLUX, HX OF 03/01/2006   Guadelupe Sabin, OTR/L  [phone] 01/14/2017, 12:03 PM  [place], [place], [address] Phone: [phone]   Fax:  [phone]  Name: JAILANI HOGANS MRN: [id number] Date of Birth: 07-05-1950

## 2017-01-14 NOTE — Patient Instructions (Signed)
1) Seated Row   Sit up straight with elbows by your sides. Pull back with shoulders/elbows, keeping forearms straight, as if pulling back on the reins of a horse. Squeeze shoulder blades together. Repeat _10__times, _3___sets/day    2) Shoulder Elevation    Sit up straight with arms by your sides. Slowly bring your shoulders up towards your ears. Repeat_10__times, __3__ sets/day    3) Shoulder Extension    Sit up straight with both arms by your side, draw your arms back behind your waist. Keep your elbows straight. Repeat _10___times, __3__sets/day.       

## 2017-01-17 ENCOUNTER — Encounter (HOSPITAL_COMMUNITY): Payer: Self-pay

## 2017-01-17 ENCOUNTER — Ambulatory Visit (HOSPITAL_COMMUNITY): Payer: Medicare Other

## 2017-01-17 DIAGNOSIS — M25512 Pain in left shoulder: Secondary | ICD-10-CM

## 2017-01-17 DIAGNOSIS — R29898 Other symptoms and signs involving the musculoskeletal system: Secondary | ICD-10-CM

## 2017-01-17 DIAGNOSIS — M25612 Stiffness of left shoulder, not elsewhere classified: Secondary | ICD-10-CM

## 2017-01-18 NOTE — Therapy (Signed)
[place], [place], [address] Phone: [phone]   Fax:  [phone]  Occupational Therapy Treatment  Patient Details  Name: Victoria Reyes MRN: [id number] Date of Birth: 09/04/1950 Referring Provider: Dr. Meredith Pel  Encounter Date: 01/17/2017      OT End of Session - 01/17/17 1728    Visit Number 3   Number of Visits 24   Date for OT Re-Evaluation 03/13/17  mini-reassessment on 02/10/17   Authorization Type Medicare A & B   Authorization Time Period Before 10th visit   Authorization - Visit Number 3   Authorization - Number of Visits 10   OT Start Time 6962   OT Stop Time 1730   OT Time Calculation (min) 40 min   Activity Tolerance Patient tolerated treatment well   Behavior During Therapy [facility] for tasks assessed/performed      Past Medical History:  Diagnosis Date  . Anemia    as a  young woman  . Diverticulitis 2005   CT   . DJD (degenerative joint disease)   . GERD (gastroesophageal reflux disease)   . Headache(784.0)   . Hiatal hernia   . Hyperlipidemia   . Hypertension   . Hypothyroidism   . Knee pain, right   . Low back pain   . Neuropathy    peroneal nerve  . Obesity   . Pneumonia    as child  . Pre-diabetes   . S/P colonoscopy 2005   Dr. Tamala Julian: sigmoid diverticulosis  . Thyroid disease   . Tobacco abuse     Past Surgical History:  Procedure Laterality Date  . BACK SURGERY    . COLONOSCOPY  12/18/2010   Procedure: COLONOSCOPY;  Surgeon: Dorothyann Peng, MD;  [facility];  Service: Endoscopy;  Laterality: N/A;  10:15am  . cyst removal right wrist    . EYE SURGERY Bilateral    cataract surgery with lens implant  . TONSILLECTOMY    . TOTAL SHOULDER ARTHROPLASTY Left 12/21/2016   Procedure: TOTAL SHOULDER ARTHROPLASTY;  Surgeon: Meredith Pel, MD;  Location: [place];  Service: Orthopedics;  Laterality: Left;  . TUBAL LIGATION    . umbillical hernia       There were no vitals filed for this visit.      Subjective Assessment - 01/17/17 1727    Subjective  S" It's much better than it was before.   Currently in Pain? Yes   Pain Score 2    Pain Location Shoulder   Pain Orientation Left   Pain Descriptors / Indicators Sore   Pain Type Surgical pain   Pain Radiating Towards N/A   Pain Onset In the past 7 days   Pain Frequency Occasional   Aggravating Factors  movement   Pain Relieving Factors rest   Effect of Pain on Daily Activities pt unable to use left arm for any daily tasks at this time.    Multiple Pain Sites No            [facility] OT Assessment - 01/17/17 1726      Assessment   Diagnosis Left TSA     Precautions   Precautions Shoulder   Type of Shoulder Precautions ROM and strengthening; avoid subscapular strengthening for 3 weeks (9/21-10/12)   Precaution Comments No lifting with weight                  OT Treatments/Exercises (OP) - 01/17/17 1726  Exercises   Exercises Shoulder     Shoulder Exercises: Supine   Protraction PROM;10 reps   Horizontal ABduction PROM;10 reps   External Rotation PROM;10 reps   Internal Rotation PROM;10 reps   Flexion PROM;10 reps   ABduction PROM;10 reps     Shoulder Exercises: Seated   Elevation AROM;10 reps   Extension AROM;10 reps   Row AROM;10 reps     Shoulder Exercises: Therapy Ball   Flexion 10 reps   ABduction 10 reps     Shoulder Exercises: Isometric Strengthening   Flexion Supine;3X3"   Extension Supine;3X3"   External Rotation Supine;3X3"   Internal Rotation Supine;3X3"   ABduction Supine;3X3"   ADduction Supine;3X3"     Manual Therapy   Manual Therapy Myofascial release   Manual therapy comments completed separately from therapeutic exercise   Myofascial Release Myofascial release to left upper arm, trapezius, and scapularis regions to decrease pain and fascial restrictions and increase joint range of motion.   Myofascial release added to  wrist and forearm this session.                   OT Short Term Goals - 01/14/17 1147      OT SHORT TERM GOAL #1   Title Pt will be provided with and educated on HEP.    Time 4   Period Weeks   Status On-going     OT SHORT TERM GOAL #2   Title Pt will decrease LUE pain to 5/10 or less to improve ability to use LUE as assist during dressing tasks.    Time 4   Period Weeks   Status On-going     OT SHORT TERM GOAL #3   Title Pt will decrease fascial restrictions in LUE from max to moderate amounts to improve mobility in LUE during daily tasks.    Time 4   Period Weeks   Status On-going     OT SHORT TERM GOAL #4   Title Pt will improve P/ROM of LUE to [facility] to increase ability to complete ADL tasks at waist level.    Time 4   Period Weeks   Status On-going     OT SHORT TERM GOAL #5   Status On-going           OT Long Term Goals - 01/14/17 1148      OT LONG TERM GOAL #1   Title Pt will return to highest level of functioning and independence in B/ADL completion using LUE as non-dominant.    Time 8   Period Weeks   Status On-going     OT LONG TERM GOAL #2   Title Pt will decrease LUE fascial restrictions from mod to minimal amounts or less to improve mobility required for functional reaching tasks.    Time 8   Period Weeks   Status On-going     OT LONG TERM GOAL #3   Title Pt will decrease LUE pain to 3/10 or less to improve use of LUE as an assist during B/ADL completion.    Time 8   Period Weeks   Status On-going     OT LONG TERM GOAL #4   Title Pt will increase LUE A/ROM to [facility] to improve ability to reach up and perform grooming tasks.    Time 8   Period Weeks   Status On-going     OT LONG TERM GOAL #5   Title Pt will improve LUE strength to 4/5 to improve ability  to perform work tasks using LUE as non-dominant.   Time 8   Period Weeks   Status On-going               Plan - 01/18/17 0849    Clinical Impression Statement A: Pt with  decreased joint mobility in all shoulder regions. Unable to tolerate passive stretching past half way range. Added myofascial release to wrist and elbow although edema does appear to be minimal. Patient reports she is doing really well compared to where she was prior.    Plan P: add thumb tacks and pro/ret/elev/dep. Add muscle energy technique when able to tolerate.       Patient will benefit from skilled therapeutic intervention in order to improve the following deficits and impairments:  Decreased strength, Decreased activity tolerance, Decreased mobility, Decreased range of motion, Pain, Impaired UE functional use, Increased fascial restricitons  Visit Diagnosis: Acute pain of left shoulder  Other symptoms and signs involving the musculoskeletal system  Stiffness of left shoulder, not elsewhere classified    Problem List Patient Active Problem List   Diagnosis Date Noted  . Neck pain on left side 01/20/2016  . Shoulder pain, left 11/26/2015  . Cutaneous wart 09/30/2014  . HTN (hypertension) 01/01/2013  . Hyperlipidemia with target LDL less than 100 05/04/2012  . IGT (impaired glucose tolerance) 03/14/2011  . GERD (gastroesophageal reflux disease) 03/14/2011  . BACK PAIN, LUMBAR, CHRONIC 06/25/2009  . Obesity 02/17/2007  . Hypothyroidism 06/14/2006  . DIVERTICULOSIS, COLON 03/01/2006  . HIATAL HERNIA WITH REFLUX, HX OF 03/01/2006   Ailene Ravel, OTR/L,CBIS  [phone]  01/18/2017, 8:52 AM  [facility] [address] [place], [place], [address] Phone: [phone]   Fax:  [phone]  Name: Victoria Reyes MRN: [id number] Date of Birth: [birth year]

## 2017-01-19 ENCOUNTER — Telehealth (HOSPITAL_COMMUNITY): Payer: Self-pay | Admitting: Occupational Therapy

## 2017-01-19 NOTE — Telephone Encounter (Signed)
01-19-17 Returned call to patient and left a message.

## 2017-01-20 ENCOUNTER — Ambulatory Visit (HOSPITAL_COMMUNITY): Payer: Medicare Other

## 2017-01-21 ENCOUNTER — Encounter (HOSPITAL_COMMUNITY): Payer: Medicare Other | Admitting: Occupational Therapy

## 2017-01-24 ENCOUNTER — Ambulatory Visit (INDEPENDENT_AMBULATORY_CARE_PROVIDER_SITE_OTHER): Payer: Medicare Other | Admitting: Orthopedic Surgery

## 2017-01-24 ENCOUNTER — Encounter (INDEPENDENT_AMBULATORY_CARE_PROVIDER_SITE_OTHER): Payer: Self-pay | Admitting: Orthopedic Surgery

## 2017-01-24 DIAGNOSIS — M19012 Primary osteoarthritis, left shoulder: Secondary | ICD-10-CM

## 2017-01-26 ENCOUNTER — Ambulatory Visit (HOSPITAL_COMMUNITY): Payer: Medicare Other | Admitting: Occupational Therapy

## 2017-01-26 ENCOUNTER — Encounter (HOSPITAL_COMMUNITY): Payer: Self-pay | Admitting: Occupational Therapy

## 2017-01-26 DIAGNOSIS — M25612 Stiffness of left shoulder, not elsewhere classified: Secondary | ICD-10-CM | POA: Diagnosis not present

## 2017-01-26 DIAGNOSIS — M25512 Pain in left shoulder: Secondary | ICD-10-CM | POA: Diagnosis not present

## 2017-01-26 DIAGNOSIS — R29898 Other symptoms and signs involving the musculoskeletal system: Secondary | ICD-10-CM | POA: Diagnosis not present

## 2017-01-26 NOTE — Progress Notes (Signed)
Post-Op Visit Note   Patient: Victoria Reyes           Date of Birth: 07/03/1950           MRN: [id number] Visit Date: 01/24/2017 PCP: Victoria Helper, MD   Assessment & Plan:  Chief Complaint:  Chief Complaint  Patient presents with  . Left Shoulder - Follow-up   Visit Diagnoses:  1. Arthritis of left shoulder region     Plan: Victoria Reyes is a patient who is now a month out left shoulder replacement.  States her motion is getting better.  On examination she still is a little on the stiff side.  Abduction and 80 forward flexion approaching 90.  Deltoid fires.  Subscap repair intact.  Patient is to continue therapy and 4 week return.  I want her to be a little bit more aggressive about getting her motion back.  Follow-Up Instructions: Return in about 4 weeks (around 02/21/2017).   Orders:  No orders of the defined types were placed in this encounter.  No orders of the defined types were placed in this encounter.   Imaging: No results found.  PMFS History: Patient Active Problem List   Diagnosis Date Noted  . Neck pain on left side 01/20/2016  . Shoulder pain, left 11/26/2015  . Cutaneous wart 09/30/2014  . HTN (hypertension) 01/01/2013  . Hyperlipidemia with target LDL less than 100 05/04/2012  . IGT (impaired glucose tolerance) 03/14/2011  . GERD (gastroesophageal reflux disease) 03/14/2011  . BACK PAIN, LUMBAR, CHRONIC 06/25/2009  . Obesity 02/17/2007  . Hypothyroidism 06/14/2006  . DIVERTICULOSIS, COLON 03/01/2006  . HIATAL HERNIA WITH REFLUX, HX OF 03/01/2006   Past Medical History:  Diagnosis Date  . Anemia    as a  young woman  . Diverticulitis 2005   CT   . DJD (degenerative joint disease)   . GERD (gastroesophageal reflux disease)   . Headache(784.0)   . Hiatal hernia   . Hyperlipidemia   . Hypertension   . Hypothyroidism   . Knee pain, right   . Low back pain   . Neuropathy    peroneal nerve  . Obesity   . Pneumonia    as child  .  Pre-diabetes   . S/P colonoscopy 2005   Dr. Tamala Reyes: sigmoid diverticulosis  . Thyroid disease   . Tobacco abuse     Family History  Problem Relation Age of Onset  . Heart attack Sister        pacemaker  . Kidney failure Sister        on dialysis  . Colon cancer Neg Hx     Past Surgical History:  Procedure Laterality Date  . BACK SURGERY    . COLONOSCOPY  12/18/2010   Procedure: COLONOSCOPY;  Surgeon: Victoria Peng, MD;  [facility];  Service: Endoscopy;  Laterality: N/A;  10:15am  . cyst removal right wrist    . EYE SURGERY Bilateral    cataract surgery with lens implant  . TONSILLECTOMY    . TOTAL SHOULDER ARTHROPLASTY Left 12/21/2016   Procedure: TOTAL SHOULDER ARTHROPLASTY;  Surgeon: Victoria Pel, MD;  Location: [place];  Service: Orthopedics;  Laterality: Left;  . TUBAL LIGATION    . umbillical hernia     Social History   Occupational History  . Not on file.   Social History Main Topics  . Smoking status: Former Smoker    Packs/day: 0.25    Years: 45.00  Types: Cigarettes    Start date: 09/30/1963    Quit date: 08/15/2015  . Smokeless tobacco: Never Used  . Alcohol use No  . Drug use: No  . Sexual activity: Not Currently    Birth control/ protection: Post-menopausal

## 2017-01-26 NOTE — Therapy (Signed)
[place], [place], [address] Phone: [phone]   Fax:  [phone]  Occupational Therapy Treatment  Patient Details  Name: Victoria Reyes MRN: [id number] Date of Birth: [birth year] Referring Provider: Dr. Meredith Pel  Encounter Date: 01/26/2017      OT End of Session - 01/26/17 1732    Visit Number 4   Number of Visits 24   Date for OT Re-Evaluation 03/13/17  mini-reassessment on 02/10/17   Authorization Type Medicare A & B   Authorization Time Period Before 10th visit   Authorization - Visit Number 4   Authorization - Number of Visits 10   OT Start Time 8416   OT Stop Time 1730   OT Time Calculation (min) 39 min   Activity Tolerance Patient tolerated treatment well   Behavior During Therapy [facility] for tasks assessed/performed      Past Medical History:  Diagnosis Date  . Anemia    as a  young woman  . Diverticulitis 2005   CT   . DJD (degenerative joint disease)   . GERD (gastroesophageal reflux disease)   . Headache(784.0)   . Hiatal hernia   . Hyperlipidemia   . Hypertension   . Hypothyroidism   . Knee pain, right   . Low back pain   . Neuropathy    peroneal nerve  . Obesity   . Pneumonia    as child  . Pre-diabetes   . S/P colonoscopy 2005   Dr. Tamala Julian: sigmoid diverticulosis  . Thyroid disease   . Tobacco abuse     Past Surgical History:  Procedure Laterality Date  . BACK SURGERY    . COLONOSCOPY  12/18/2010   Procedure: COLONOSCOPY;  Surgeon: Dorothyann Peng, MD;  [facility];  Service: Endoscopy;  Laterality: N/A;  10:15am  . cyst removal right wrist    . EYE SURGERY Bilateral    cataract surgery with lens implant  . TONSILLECTOMY    . TOTAL SHOULDER ARTHROPLASTY Left 12/21/2016   Procedure: TOTAL SHOULDER ARTHROPLASTY;  Surgeon: Meredith Pel, MD;  Location: [place];  Service: Orthopedics;  Laterality: Left;  . TUBAL LIGATION    . umbillical hernia       There were no vitals filed for this visit.      Subjective Assessment - 01/26/17 1653    Subjective  S: I went to the doctor and he said I need to be less stiff.    Currently in Pain? No/denies            [facility] OT Assessment - 01/26/17 1653      Assessment   Diagnosis Left TSA     Precautions   Precautions Shoulder   Type of Shoulder Precautions ROM and strengthening as tolerated: er to 45 when stretching   Precaution Comments No lifting with weight                  OT Treatments/Exercises (OP) - 01/26/17 1655      Exercises   Exercises Shoulder     Shoulder Exercises: Supine   Protraction PROM;AAROM;10 reps   Horizontal ABduction PROM;AAROM;10 reps   External Rotation PROM;AAROM;10 reps   Internal Rotation PROM;AAROM;10 reps   Flexion PROM;AAROM;10 reps   ABduction PROM;AAROM;10 reps     Shoulder Exercises: Seated   Elevation AROM;12 reps   Extension AROM;12 reps   Row AROM;12 reps     Shoulder Exercises: Therapy Diona Foley  Flexion 15 reps   ABduction 15 reps     Shoulder Exercises: ROM/Strengthening   Thumb Tacks 1'   Prot/Ret//Elev/Dep 1'     Manual Therapy   Manual Therapy Myofascial release   Manual therapy comments completed separately from therapeutic exercise   Myofascial Release Myofascial release to left upper arm, trapezius, and scapularis regions to decrease pain and fascial restrictions and increase joint range of motion.   wrist and forearm as well                  OT Short Term Goals - 01/14/17 1147      OT SHORT TERM GOAL #1   Title Pt will be provided with and educated on HEP.    Time 4   Period Weeks   Status On-going     OT SHORT TERM GOAL #2   Title Pt will decrease LUE pain to 5/10 or less to improve ability to use LUE as assist during dressing tasks.    Time 4   Period Weeks   Status On-going     OT SHORT TERM GOAL #3   Title Pt will decrease fascial restrictions in LUE from max to moderate amounts  to improve mobility in LUE during daily tasks.    Time 4   Period Weeks   Status On-going     OT SHORT TERM GOAL #4   Title Pt will improve P/ROM of LUE to [facility] to increase ability to complete ADL tasks at waist level.    Time 4   Period Weeks   Status On-going     OT SHORT TERM GOAL #5   Status On-going           OT Long Term Goals - 01/14/17 1148      OT LONG TERM GOAL #1   Title Pt will return to highest level of functioning and independence in B/ADL completion using LUE as non-dominant.    Time 8   Period Weeks   Status On-going     OT LONG TERM GOAL #2   Title Pt will decrease LUE fascial restrictions from mod to minimal amounts or less to improve mobility required for functional reaching tasks.    Time 8   Period Weeks   Status On-going     OT LONG TERM GOAL #3   Title Pt will decrease LUE pain to 3/10 or less to improve use of LUE as an assist during B/ADL completion.    Time 8   Period Weeks   Status On-going     OT LONG TERM GOAL #4   Title Pt will increase LUE A/ROM to [facility] to improve ability to reach up and perform grooming tasks.    Time 8   Period Weeks   Status On-going     OT LONG TERM GOAL #5   Title Pt will improve LUE strength to 4/5 to improve ability to perform work tasks using LUE as non-dominant.   Time 8   Period Weeks   Status On-going               Plan - 01/26/17 1714    Clinical Impression Statement A: Pt reports she went to MD and provided new order for therapy. Per MD note pt needs agressive stretching and ROM to improve motion. Initiated AA/ROM this session, pt able to achieve 50% range this session, verbal cuing for form and technique. Pt continues to have stiffness and is guarded during passive stretching.  Plan P: continue with AA/ROM and add to HEP. Add muscle energy technique if able to tolerate   OT Home Exercise Plan 10/3: table slides; 10/5: scapular A/ROM   Consulted and Agree with Plan of Care Patient       Patient will benefit from skilled therapeutic intervention in order to improve the following deficits and impairments:  Decreased strength, Decreased activity tolerance, Decreased mobility, Decreased range of motion, Pain, Impaired UE functional use, Increased fascial restricitons  Visit Diagnosis: Acute pain of left shoulder  Other symptoms and signs involving the musculoskeletal system  Stiffness of left shoulder, not elsewhere classified    Problem List Patient Active Problem List   Diagnosis Date Noted  . Neck pain on left side 01/20/2016  . Shoulder pain, left 11/26/2015  . Cutaneous wart 09/30/2014  . HTN (hypertension) 01/01/2013  . Hyperlipidemia with target LDL less than 100 05/04/2012  . IGT (impaired glucose tolerance) 03/14/2011  . GERD (gastroesophageal reflux disease) 03/14/2011  . BACK PAIN, LUMBAR, CHRONIC 06/25/2009  . Obesity 02/17/2007  . Hypothyroidism 06/14/2006  . DIVERTICULOSIS, COLON 03/01/2006  . HIATAL HERNIA WITH REFLUX, HX OF 03/01/2006   Guadelupe Sabin, OTR/L  [phone] 01/26/2017, 5:33 PM  [facility] [address] [place], [place], [address] Phone: [phone]   Fax:  [phone]  Name: Victoria Reyes MRN: [id number] Date of Birth: 04/07/1951

## 2017-01-28 ENCOUNTER — Encounter (HOSPITAL_COMMUNITY): Payer: Self-pay | Admitting: Occupational Therapy

## 2017-01-28 ENCOUNTER — Ambulatory Visit (HOSPITAL_COMMUNITY): Payer: Medicare Other | Admitting: Occupational Therapy

## 2017-01-28 DIAGNOSIS — R29898 Other symptoms and signs involving the musculoskeletal system: Secondary | ICD-10-CM | POA: Diagnosis not present

## 2017-01-28 DIAGNOSIS — M25612 Stiffness of left shoulder, not elsewhere classified: Secondary | ICD-10-CM | POA: Diagnosis not present

## 2017-01-28 DIAGNOSIS — M25512 Pain in left shoulder: Secondary | ICD-10-CM

## 2017-01-28 NOTE — Therapy (Signed)
[place], [place], [address] Phone: [phone]   Fax:  [phone]  Occupational Therapy Treatment  Patient Details  Name: Victoria Reyes MRN: [id number] Date of Birth: [birth year] Referring Provider: Dr. Meredith Pel  Encounter Date: 01/28/2017      OT End of Session - 01/28/17 1708    Visit Number 5   Number of Visits 24   Date for OT Re-Evaluation 03/13/17  mini-reassessment on 02/10/17   Authorization Type Medicare A & B   Authorization Time Period Before 10th visit   Authorization - Visit Number 5   Authorization - Number of Visits 10   OT Start Time 1740   OT Stop Time 1644   OT Time Calculation (min) 46 min   Activity Tolerance Patient tolerated treatment well   Behavior During Therapy [facility] for tasks assessed/performed      Past Medical History:  Diagnosis Date  . Anemia    as a  young woman  . Diverticulitis 2005   CT   . DJD (degenerative joint disease)   . GERD (gastroesophageal reflux disease)   . Headache(784.0)   . Hiatal hernia   . Hyperlipidemia   . Hypertension   . Hypothyroidism   . Knee pain, right   . Low back pain   . Neuropathy    peroneal nerve  . Obesity   . Pneumonia    as child  . Pre-diabetes   . S/P colonoscopy 2005   Dr. Tamala Julian: sigmoid diverticulosis  . Thyroid disease   . Tobacco abuse     Past Surgical History:  Procedure Laterality Date  . BACK SURGERY    . COLONOSCOPY  12/18/2010   Procedure: COLONOSCOPY;  Surgeon: Dorothyann Peng, MD;  [facility];  Service: Endoscopy;  Laterality: N/A;  10:15am  . cyst removal right wrist    . EYE SURGERY Bilateral    cataract surgery with lens implant  . TONSILLECTOMY    . TOTAL SHOULDER ARTHROPLASTY Left 12/21/2016   Procedure: TOTAL SHOULDER ARTHROPLASTY;  Surgeon: Meredith Pel, MD;  Location: [place];  Service: Orthopedics;  Laterality: Left;  . TUBAL LIGATION    . umbillical hernia       There were no vitals filed for this visit.      Subjective Assessment - 01/28/17 1601    Subjective  S: I don't have to take the pills now.    Currently in Pain? Yes   Pain Score 3    Pain Location Shoulder   Pain Orientation Left   Pain Descriptors / Indicators Sore   Pain Type Acute pain   Pain Radiating Towards n/a   Pain Onset 1 to 4 weeks ago   Pain Frequency Intermittent   Aggravating Factors  movement   Pain Relieving Factors rest, heat   Effect of Pain on Daily Activities max effect on ADL completion   Multiple Pain Sites No            [facility] OT Assessment - 01/28/17 1600      Assessment   Diagnosis Left TSA     Precautions   Precautions Shoulder   Type of Shoulder Precautions ROM and strengthening as tolerated: er to 45 when stretching   Precaution Comments No lifting with weight                  OT Treatments/Exercises (OP) - 01/28/17 1602      Exercises  Exercises Shoulder     Shoulder Exercises: Supine   Protraction PROM;AAROM;10 reps   Horizontal ABduction PROM;AAROM;10 reps   External Rotation PROM;AAROM;10 reps   Internal Rotation PROM;AAROM;10 reps   Flexion PROM;AAROM;10 reps   ABduction PROM;AAROM;10 reps     Shoulder Exercises: Seated   Elevation AROM;12 reps   Extension AROM;12 reps   Row AROM;12 reps     Shoulder Exercises: Pulleys   Flexion 1 minute     Shoulder Exercises: Therapy Ball   Flexion 15 reps   ABduction 15 reps     Shoulder Exercises: ROM/Strengthening   Thumb Tacks 1'   Prot/Ret//Elev/Dep 1'     Manual Therapy   Manual Therapy Myofascial release   Manual therapy comments completed separately from therapeutic exercise   Myofascial Release Myofascial release to left upper arm, trapezius, and scapularis regions to decrease pain and fascial restrictions and increase joint range of motion.                   OT Short Term Goals - 01/14/17 1147      OT SHORT TERM GOAL #1   Title Pt will  be provided with and educated on HEP.    Time 4   Period Weeks   Status On-going     OT SHORT TERM GOAL #2   Title Pt will decrease LUE pain to 5/10 or less to improve ability to use LUE as assist during dressing tasks.    Time 4   Period Weeks   Status On-going     OT SHORT TERM GOAL #3   Title Pt will decrease fascial restrictions in LUE from max to moderate amounts to improve mobility in LUE during daily tasks.    Time 4   Period Weeks   Status On-going     OT SHORT TERM GOAL #4   Title Pt will improve P/ROM of LUE to [facility] to increase ability to complete ADL tasks at waist level.    Time 4   Period Weeks   Status On-going     OT SHORT TERM GOAL #5   Status On-going           OT Long Term Goals - 01/14/17 1148      OT LONG TERM GOAL #1   Title Pt will return to highest level of functioning and independence in B/ADL completion using LUE as non-dominant.    Time 8   Period Weeks   Status On-going     OT LONG TERM GOAL #2   Title Pt will decrease LUE fascial restrictions from mod to minimal amounts or less to improve mobility required for functional reaching tasks.    Time 8   Period Weeks   Status On-going     OT LONG TERM GOAL #3   Title Pt will decrease LUE pain to 3/10 or less to improve use of LUE as an assist during B/ADL completion.    Time 8   Period Weeks   Status On-going     OT LONG TERM GOAL #4   Title Pt will increase LUE A/ROM to [facility] to improve ability to reach up and perform grooming tasks.    Time 8   Period Weeks   Status On-going     OT LONG TERM GOAL #5   Title Pt will improve LUE strength to 4/5 to improve ability to perform work tasks using LUE as non-dominant.   Time 8   Period Weeks   Status On-going  Plan - 01/28/17 1709    Clinical Impression Statement A: Pt reports she has been exercising daily and feels she is improving. Pt continues to be guarded during passive stretching, ROM limited due to pain and  resistance. Continued with AA/ROM, verbal cuing for form and technique, added pulleys. Did not add AA/ROM to HEP due to form.    Plan P: Add AA/ROM to HEP, add therapy ball circles.    OT Home Exercise Plan 10/3: table slides; 10/5: scapular A/ROM   Consulted and Agree with Plan of Care Patient      Patient will benefit from skilled therapeutic intervention in order to improve the following deficits and impairments:  Decreased strength, Decreased activity tolerance, Decreased mobility, Decreased range of motion, Pain, Impaired UE functional use, Increased fascial restricitons  Visit Diagnosis: Acute pain of left shoulder  Other symptoms and signs involving the musculoskeletal system  Stiffness of left shoulder, not elsewhere classified    Problem List Patient Active Problem List   Diagnosis Date Noted  . Neck pain on left side 01/20/2016  . Shoulder pain, left 11/26/2015  . Cutaneous wart 09/30/2014  . HTN (hypertension) 01/01/2013  . Hyperlipidemia with target LDL less than 100 05/04/2012  . IGT (impaired glucose tolerance) 03/14/2011  . GERD (gastroesophageal reflux disease) 03/14/2011  . BACK PAIN, LUMBAR, CHRONIC 06/25/2009  . Obesity 02/17/2007  . Hypothyroidism 06/14/2006  . DIVERTICULOSIS, COLON 03/01/2006  . HIATAL HERNIA WITH REFLUX, HX OF 03/01/2006   Guadelupe Sabin, OTR/L  [phone] 01/28/2017, 5:12 PM  [place] [address] [place], [place], [address] Phone: [phone]   Fax:  [phone]  Name: Victoria Reyes MRN: [id number] Date of Birth: Nov 17, 1950

## 2017-01-31 ENCOUNTER — Encounter (HOSPITAL_COMMUNITY): Payer: Self-pay

## 2017-01-31 ENCOUNTER — Ambulatory Visit (HOSPITAL_COMMUNITY): Payer: Medicare Other

## 2017-01-31 DIAGNOSIS — R29898 Other symptoms and signs involving the musculoskeletal system: Secondary | ICD-10-CM | POA: Diagnosis not present

## 2017-01-31 DIAGNOSIS — M25512 Pain in left shoulder: Secondary | ICD-10-CM | POA: Diagnosis not present

## 2017-01-31 DIAGNOSIS — M25612 Stiffness of left shoulder, not elsewhere classified: Secondary | ICD-10-CM | POA: Diagnosis not present

## 2017-01-31 NOTE — Therapy (Signed)
[place], [place], [address] Phone: [phone]   Fax:  [phone]  Occupational Therapy Treatment  Patient Details  Name: Victoria Reyes MRN: [id number] Date of Birth: 09-01-1950 Referring Provider: Dr. Meredith Pel  Encounter Date: 01/31/2017      OT End of Session - 01/31/17 1725    Visit Number 6   Number of Visits 24   Date for OT Re-Evaluation 03/13/17  mini-reassessment on 02/10/17   Authorization Type Medicare A & B   Authorization Time Period Before 10th visit   Authorization - Visit Number 6   Authorization - Number of Visits 10   OT Start Time 5465   OT Stop Time 1735   OT Time Calculation (min) 42 min   Activity Tolerance Patient tolerated treatment well   Behavior During Therapy [facility] for tasks assessed/performed      Past Medical History:  Diagnosis Date  . Anemia    as a  young woman  . Diverticulitis 2005   CT   . DJD (degenerative joint disease)   . GERD (gastroesophageal reflux disease)   . Headache(784.0)   . Hiatal hernia   . Hyperlipidemia   . Hypertension   . Hypothyroidism   . Knee pain, right   . Low back pain   . Neuropathy    peroneal nerve  . Obesity   . Pneumonia    as child  . Pre-diabetes   . S/P colonoscopy 2005   Dr. Tamala Julian: sigmoid diverticulosis  . Thyroid disease   . Tobacco abuse     Past Surgical History:  Procedure Laterality Date  . BACK SURGERY    . COLONOSCOPY  12/18/2010   Procedure: COLONOSCOPY;  Surgeon: Dorothyann Peng, MD;  [facility];  Service: Endoscopy;  Laterality: N/A;  10:15am  . cyst removal right wrist    . EYE SURGERY Bilateral    cataract surgery with lens implant  . TONSILLECTOMY    . TOTAL SHOULDER ARTHROPLASTY Left 12/21/2016   Procedure: TOTAL SHOULDER ARTHROPLASTY;  Surgeon: Meredith Pel, MD;  Location: [place];  Service: Orthopedics;  Laterality: Left;  . TUBAL LIGATION    . umbillical hernia       There were no vitals filed for this visit.      Subjective Assessment - 01/31/17 1719    Subjective  S: I've been working on it.   Currently in Pain? No/denies            [facility] OT Assessment - 01/31/17 1719      Assessment   Diagnosis Left TSA     Precautions   Precautions Shoulder   Type of Shoulder Precautions ROM and strengthening as tolerated: er to 45 when stretching   Precaution Comments No lifting with weight                  OT Treatments/Exercises (OP) - 01/31/17 1720      Exercises   Exercises Shoulder     Shoulder Exercises: Supine   Protraction PROM;AAROM;10 reps   Horizontal ABduction PROM;AAROM;10 reps   External Rotation PROM;AAROM;10 reps   Internal Rotation PROM;AAROM;10 reps   Flexion PROM;AAROM;10 reps   ABduction PROM;AAROM;10 reps     Shoulder Exercises: Seated   Protraction AAROM;10 reps   Horizontal ABduction AAROM;10 reps   External Rotation AAROM;10 reps   Internal Rotation AAROM;10 reps   Flexion AAROM;10 reps   Abduction AAROM;10 reps  Manual Therapy   Manual Therapy Myofascial release;Muscle Energy Technique   Manual therapy comments completed separately from therapeutic exercise   Myofascial Release Myofascial release to left upper arm, trapezius, and scapularis regions to decrease pain and fascial restrictions and increase joint range of motion.    Muscle Energy Technique Muscle energy technique completed to left shoulder to relax tone and muscle spasm and improve range of motion.                 OT Education - 01/31/17 1725    Education provided Yes   Education Details AA/ROM shoulder exercises   Person(s) Educated Patient   Methods Explanation;Demonstration;Handout   Comprehension Returned demonstration;Verbalized understanding          OT Short Term Goals - 01/14/17 1147      OT SHORT TERM GOAL #1   Title Pt will be provided with and educated on HEP.    Time 4   Period Weeks   Status  On-going     OT SHORT TERM GOAL #2   Title Pt will decrease LUE pain to 5/10 or less to improve ability to use LUE as assist during dressing tasks.    Time 4   Period Weeks   Status On-going     OT SHORT TERM GOAL #3   Title Pt will decrease fascial restrictions in LUE from max to moderate amounts to improve mobility in LUE during daily tasks.    Time 4   Period Weeks   Status On-going     OT SHORT TERM GOAL #4   Title Pt will improve P/ROM of LUE to [facility] to increase ability to complete ADL tasks at waist level.    Time 4   Period Weeks   Status On-going     OT SHORT TERM GOAL #5   Status On-going           OT Long Term Goals - 01/14/17 1148      OT LONG TERM GOAL #1   Title Pt will return to highest level of functioning and independence in B/ADL completion using LUE as non-dominant.    Time 8   Period Weeks   Status On-going     OT LONG TERM GOAL #2   Title Pt will decrease LUE fascial restrictions from mod to minimal amounts or less to improve mobility required for functional reaching tasks.    Time 8   Period Weeks   Status On-going     OT LONG TERM GOAL #3   Title Pt will decrease LUE pain to 3/10 or less to improve use of LUE as an assist during B/ADL completion.    Time 8   Period Weeks   Status On-going     OT LONG TERM GOAL #4   Title Pt will increase LUE A/ROM to [facility] to improve ability to reach up and perform grooming tasks.    Time 8   Period Weeks   Status On-going     OT LONG TERM GOAL #5   Title Pt will improve LUE strength to 4/5 to improve ability to perform work tasks using LUE as non-dominant.   Time 8   Period Weeks   Status On-going               Plan - 01/31/17 1733    Clinical Impression Statement A: Pt given AA/ROM as HEP and reviewed each exercise with VC for form and technique. Added muscle energy technique which allowed patient to  increase passive ROM although it was painful.    Plan P: Add therapy ball circles. Continue  to work on increasing ROM to increase functional use and mobility. Complet wall wash or/and finger ladder.   Consulted and Agree with Plan of Care Patient      Patient will benefit from skilled therapeutic intervention in order to improve the following deficits and impairments:  Decreased strength, Decreased activity tolerance, Decreased mobility, Decreased range of motion, Pain, Impaired UE functional use, Increased fascial restricitons  Visit Diagnosis: Acute pain of left shoulder  Other symptoms and signs involving the musculoskeletal system  Stiffness of left shoulder, not elsewhere classified    Problem List Patient Active Problem List   Diagnosis Date Noted  . Neck pain on left side 01/20/2016  . Shoulder pain, left 11/26/2015  . Cutaneous wart 09/30/2014  . HTN (hypertension) 01/01/2013  . Hyperlipidemia with target LDL less than 100 05/04/2012  . IGT (impaired glucose tolerance) 03/14/2011  . GERD (gastroesophageal reflux disease) 03/14/2011  . BACK PAIN, LUMBAR, CHRONIC 06/25/2009  . Obesity 02/17/2007  . Hypothyroidism 06/14/2006  . DIVERTICULOSIS, COLON 03/01/2006  . HIATAL HERNIA WITH REFLUX, HX OF 03/01/2006   Ailene Ravel, OTR/L,CBIS  [phone]  01/31/2017, 5:38 PM  [place] [address] [place], [place], [address] Phone: [phone]   Fax:  [phone]  Name: Victoria Reyes MRN: [id number] Date of Birth: April 29, 1950

## 2017-01-31 NOTE — Patient Instructions (Signed)

## 2017-02-02 ENCOUNTER — Ambulatory Visit (HOSPITAL_COMMUNITY): Payer: Medicare Other | Admitting: Occupational Therapy

## 2017-02-02 ENCOUNTER — Encounter (HOSPITAL_COMMUNITY): Payer: Self-pay | Admitting: Occupational Therapy

## 2017-02-02 DIAGNOSIS — M25512 Pain in left shoulder: Secondary | ICD-10-CM | POA: Diagnosis not present

## 2017-02-02 DIAGNOSIS — M25612 Stiffness of left shoulder, not elsewhere classified: Secondary | ICD-10-CM | POA: Diagnosis not present

## 2017-02-02 DIAGNOSIS — R29898 Other symptoms and signs involving the musculoskeletal system: Secondary | ICD-10-CM | POA: Diagnosis not present

## 2017-02-02 NOTE — Therapy (Signed)
[place], [place], [address] Phone: [phone]   Fax:  [phone]  Occupational Therapy Treatment  Patient Details  Name: Victoria Reyes MRN: [id number] Date of Birth: 05-18-1950 Referring Provider: Dr. Meredith Pel  Encounter Date: 02/02/2017      OT End of Session - 02/02/17 1731    Visit Number 7   Number of Visits 24   Date for OT Re-Evaluation 03/13/17  mini-reassessment on 02/10/17   Authorization Type Medicare A & B   Authorization Time Period Before 10th visit   Authorization - Visit Number 7   Authorization - Number of Visits 10   OT Start Time 4081   OT Stop Time 1730   OT Time Calculation (min) 40 min   Activity Tolerance Patient tolerated treatment well   Behavior During Therapy [facility] for tasks assessed/performed      Past Medical History:  Diagnosis Date  . Anemia    as a  young woman  . Diverticulitis 2005   CT   . DJD (degenerative joint disease)   . GERD (gastroesophageal reflux disease)   . Headache(784.0)   . Hiatal hernia   . Hyperlipidemia   . Hypertension   . Hypothyroidism   . Knee pain, right   . Low back pain   . Neuropathy    peroneal nerve  . Obesity   . Pneumonia    as child  . Pre-diabetes   . S/P colonoscopy 2005   Dr. Tamala Julian: sigmoid diverticulosis  . Thyroid disease   . Tobacco abuse     Past Surgical History:  Procedure Laterality Date  . BACK SURGERY    . COLONOSCOPY  12/18/2010   Procedure: COLONOSCOPY;  Surgeon: Dorothyann Peng, MD;  [facility];  Service: Endoscopy;  Laterality: N/A;  10:15am  . cyst removal right wrist    . EYE SURGERY Bilateral    cataract surgery with lens implant  . TONSILLECTOMY    . TOTAL SHOULDER ARTHROPLASTY Left 12/21/2016   Procedure: TOTAL SHOULDER ARTHROPLASTY;  Surgeon: Meredith Pel, MD;  Location: [place];  Service: Orthopedics;  Laterality: Left;  . TUBAL LIGATION    . umbillical hernia       There were no vitals filed for this visit.      Subjective Assessment - 02/02/17 1653    Subjective  S: I was really sore after last session.    Currently in Pain? No/denies            [facility] OT Assessment - 02/02/17 1652      Assessment   Diagnosis Left TSA     Precautions   Precautions Shoulder   Type of Shoulder Precautions ROM and strengthening as tolerated: er to 45 when stretching   Precaution Comments No lifting with weight                  OT Treatments/Exercises (OP) - 02/02/17 1653      Exercises   Exercises Shoulder     Shoulder Exercises: Supine   Protraction PROM;10 reps;AAROM;12 reps   Horizontal ABduction PROM;10 reps;AAROM;12 reps   External Rotation PROM;10 reps;AAROM;12 reps   Internal Rotation PROM;10 reps;AAROM;12 reps   Flexion PROM;10 reps;AAROM;12 reps   ABduction PROM;10 reps;AAROM;12 reps     Shoulder Exercises: Seated   Protraction AAROM;10 reps   Horizontal ABduction AAROM;10 reps   External Rotation AAROM;10 reps   Internal Rotation AAROM;10 reps   Flexion  AAROM;10 reps   Abduction AAROM;10 reps     Shoulder Exercises: Pulleys   Flexion 1 minute     Shoulder Exercises: ROM/Strengthening   Wall Wash 1'     Manual Therapy   Manual Therapy Myofascial release;Muscle Energy Technique   Manual therapy comments completed separately from therapeutic exercise   Myofascial Release Myofascial release to left upper arm, trapezius, and scapularis regions to decrease pain and fascial restrictions and increase joint range of motion.    Muscle Energy Technique Muscle energy technique completed to left shoulder to relax tone and muscle spasm and improve range of motion.                   OT Short Term Goals - 01/14/17 1147      OT SHORT TERM GOAL #1   Title Pt will be provided with and educated on HEP.    Time 4   Period Weeks   Status On-going     OT SHORT TERM GOAL #2   Title Pt will decrease LUE pain to 5/10  or less to improve ability to use LUE as assist during dressing tasks.    Time 4   Period Weeks   Status On-going     OT SHORT TERM GOAL #3   Title Pt will decrease fascial restrictions in LUE from max to moderate amounts to improve mobility in LUE during daily tasks.    Time 4   Period Weeks   Status On-going     OT SHORT TERM GOAL #4   Title Pt will improve P/ROM of LUE to [facility] to increase ability to complete ADL tasks at waist level.    Time 4   Period Weeks   Status On-going     OT SHORT TERM GOAL #5   Status On-going           OT Long Term Goals - 01/14/17 1148      OT LONG TERM GOAL #1   Title Pt will return to highest level of functioning and independence in B/ADL completion using LUE as non-dominant.    Time 8   Period Weeks   Status On-going     OT LONG TERM GOAL #2   Title Pt will decrease LUE fascial restrictions from mod to minimal amounts or less to improve mobility required for functional reaching tasks.    Time 8   Period Weeks   Status On-going     OT LONG TERM GOAL #3   Title Pt will decrease LUE pain to 3/10 or less to improve use of LUE as an assist during B/ADL completion.    Time 8   Period Weeks   Status On-going     OT LONG TERM GOAL #4   Title Pt will increase LUE A/ROM to [facility] to improve ability to reach up and perform grooming tasks.    Time 8   Period Weeks   Status On-going     OT LONG TERM GOAL #5   Title Pt will improve LUE strength to 4/5 to improve ability to perform work tasks using LUE as non-dominant.   Time 8   Period Weeks   Status On-going               Plan - 02/02/17 1710    Clinical Impression Statement A: Continued with passive stretching and manual therapy, AA/ROM with verbal cuing for form and technique. Pt continues to be guarded during passive stretching limiting ROM achieved. Added wall  wash pt with mod difficulty. Pt reports HEP is going well.    Plan P: Continue to work on increasing ROM, complete  finger ladder and therapy ball circles   OT Home Exercise Plan 10/3: table slides; 10/5: scapular A/ROM   Consulted and Agree with Plan of Care Patient      Patient will benefit from skilled therapeutic intervention in order to improve the following deficits and impairments:  Decreased strength, Decreased activity tolerance, Decreased mobility, Decreased range of motion, Pain, Impaired UE functional use, Increased fascial restricitons  Visit Diagnosis: Acute pain of left shoulder  Other symptoms and signs involving the musculoskeletal system  Stiffness of left shoulder, not elsewhere classified    Problem List Patient Active Problem List   Diagnosis Date Noted  . Neck pain on left side 01/20/2016  . Shoulder pain, left 11/26/2015  . Cutaneous wart 09/30/2014  . HTN (hypertension) 01/01/2013  . Hyperlipidemia with target LDL less than 100 05/04/2012  . IGT (impaired glucose tolerance) 03/14/2011  . GERD (gastroesophageal reflux disease) 03/14/2011  . BACK PAIN, LUMBAR, CHRONIC 06/25/2009  . Obesity 02/17/2007  . Hypothyroidism 06/14/2006  . DIVERTICULOSIS, COLON 03/01/2006  . HIATAL HERNIA WITH REFLUX, HX OF 03/01/2006   Guadelupe Sabin, OTR/L  [phone] 02/02/2017, 5:31 PM  [place] [address] [place], [place], [address] Phone: [phone]   Fax:  [phone]  Name: Victoria Reyes MRN: [id number] Date of Birth: [birth year]

## 2017-02-04 ENCOUNTER — Encounter (HOSPITAL_COMMUNITY): Payer: Self-pay | Admitting: Occupational Therapy

## 2017-02-04 ENCOUNTER — Telehealth (INDEPENDENT_AMBULATORY_CARE_PROVIDER_SITE_OTHER): Payer: Self-pay | Admitting: Orthopedic Surgery

## 2017-02-04 ENCOUNTER — Ambulatory Visit (HOSPITAL_COMMUNITY): Payer: Medicare Other | Admitting: Occupational Therapy

## 2017-02-04 DIAGNOSIS — R29898 Other symptoms and signs involving the musculoskeletal system: Secondary | ICD-10-CM

## 2017-02-04 DIAGNOSIS — M25512 Pain in left shoulder: Secondary | ICD-10-CM | POA: Diagnosis not present

## 2017-02-04 DIAGNOSIS — M25612 Stiffness of left shoulder, not elsewhere classified: Secondary | ICD-10-CM | POA: Diagnosis not present

## 2017-02-04 NOTE — Telephone Encounter (Signed)
Please advise. Thanks.  

## 2017-02-04 NOTE — Telephone Encounter (Signed)
Patient called asking for either a different type of pain medication or uping the dosage of her Hydromorphone. She said therapy is unbearable. CB # [phone]

## 2017-02-04 NOTE — Telephone Encounter (Signed)
Nothing stronger than what she is on

## 2017-02-04 NOTE — Telephone Encounter (Signed)
Please advise on pain medication. 

## 2017-02-04 NOTE — Telephone Encounter (Signed)
IC s/w patient and advised  

## 2017-02-04 NOTE — Therapy (Signed)
[place], [place], [address] Phone: [phone]   Fax:  [phone]  Occupational Therapy Treatment  Patient Details  Name: HENNA DERDERIAN MRN: [id number] Date of Birth: July 17, 1950 Referring Provider: Dr. Meredith Pel  Encounter Date: 02/04/2017      OT End of Session - 02/04/17 1732    Visit Number 8   Number of Visits 24   Date for OT Re-Evaluation 03/13/17  mini-reassessment on 02/10/17   Authorization Type Medicare A & B   Authorization Time Period Before 10th visit   Authorization - Visit Number 8   Authorization - Number of Visits 10   OT Start Time 0867   OT Stop Time 1731   OT Time Calculation (min) 42 min   Activity Tolerance Patient tolerated treatment well   Behavior During Therapy [facility] for tasks assessed/performed      Past Medical History:  Diagnosis Date  . Anemia    as a  young woman  . Diverticulitis 2005   CT   . DJD (degenerative joint disease)   . GERD (gastroesophageal reflux disease)   . Headache(784.0)   . Hiatal hernia   . Hyperlipidemia   . Hypertension   . Hypothyroidism   . Knee pain, right   . Low back pain   . Neuropathy    peroneal nerve  . Obesity   . Pneumonia    as child  . Pre-diabetes   . S/P colonoscopy 2005   Dr. Tamala Julian: sigmoid diverticulosis  . Thyroid disease   . Tobacco abuse     Past Surgical History:  Procedure Laterality Date  . BACK SURGERY    . COLONOSCOPY  12/18/2010   Procedure: COLONOSCOPY;  Surgeon: Dorothyann Peng, MD;  [facility];  Service: Endoscopy;  Laterality: N/A;  10:15am  . cyst removal right wrist    . EYE SURGERY Bilateral    cataract surgery with lens implant  . TONSILLECTOMY    . TOTAL SHOULDER ARTHROPLASTY Left 12/21/2016   Procedure: TOTAL SHOULDER ARTHROPLASTY;  Surgeon: Meredith Pel, MD;  Location: [place];  Service: Orthopedics;  Laterality: Left;  . TUBAL LIGATION    . umbillical hernia       There were no vitals filed for this visit.      Subjective Assessment - 02/04/17 1650    Subjective  S: I called the doctor but they couldn't increase the pain medication.    Currently in Pain? Yes   Pain Score 2    Pain Location Shoulder   Pain Orientation Left   Pain Descriptors / Indicators Aching;Sore   Pain Type Acute pain   Pain Radiating Towards n/a   Pain Onset 1 to 4 weeks ago   Pain Frequency Intermittent   Aggravating Factors  movement, weather   Pain Relieving Factors rest, heat   Effect of Pain on Daily Activities max effecton ADL completion   Multiple Pain Sites No            [facility] OT Assessment - 02/04/17 1649      Assessment   Diagnosis Left TSA     Precautions   Precautions Shoulder   Type of Shoulder Precautions ROM and strengthening as tolerated: er to 45 when stretching   Precaution Comments No lifting with weight                  OT Treatments/Exercises (OP) - 02/04/17 1652  Exercises   Exercises Shoulder     Shoulder Exercises: Supine   Protraction PROM;10 reps;AAROM;12 reps   Horizontal ABduction PROM;10 reps;AAROM;12 reps   External Rotation PROM;10 reps;AAROM;12 reps   Internal Rotation PROM;10 reps;AAROM;12 reps   Flexion PROM;10 reps;AAROM;12 reps   ABduction PROM;10 reps;AAROM;12 reps     Shoulder Exercises: Seated   Protraction AAROM;10 reps   Horizontal ABduction AAROM;10 reps   External Rotation AAROM;10 reps   Internal Rotation AAROM;10 reps   Flexion AAROM;10 reps   Abduction AAROM;10 reps     Shoulder Exercises: Therapy Ball   Right/Left --  3 reps each direction     Shoulder Exercises: ROM/Strengthening   Wall Wash 1'   Other ROM/Strengthening Exercises Finger ladder 2 reps to 10th peg     Manual Therapy   Manual Therapy Myofascial release;Muscle Energy Technique   Manual therapy comments completed separately from therapeutic exercise   Myofascial Release Myofascial release to left upper arm,  trapezius, and scapularis regions to decrease pain and fascial restrictions and increase joint range of motion.    Muscle Energy Technique Muscle energy technique completed to left shoulder to relax tone and muscle spasm and improve range of motion.                   OT Short Term Goals - 01/14/17 1147      OT SHORT TERM GOAL #1   Title Pt will be provided with and educated on HEP.    Time 4   Period Weeks   Status On-going     OT SHORT TERM GOAL #2   Title Pt will decrease LUE pain to 5/10 or less to improve ability to use LUE as assist during dressing tasks.    Time 4   Period Weeks   Status On-going     OT SHORT TERM GOAL #3   Title Pt will decrease fascial restrictions in LUE from max to moderate amounts to improve mobility in LUE during daily tasks.    Time 4   Period Weeks   Status On-going     OT SHORT TERM GOAL #4   Title Pt will improve P/ROM of LUE to [facility] to increase ability to complete ADL tasks at waist level.    Time 4   Period Weeks   Status On-going     OT SHORT TERM GOAL #5   Status On-going           OT Long Term Goals - 01/14/17 1148      OT LONG TERM GOAL #1   Title Pt will return to highest level of functioning and independence in B/ADL completion using LUE as non-dominant.    Time 8   Period Weeks   Status On-going     OT LONG TERM GOAL #2   Title Pt will decrease LUE fascial restrictions from mod to minimal amounts or less to improve mobility required for functional reaching tasks.    Time 8   Period Weeks   Status On-going     OT LONG TERM GOAL #3   Title Pt will decrease LUE pain to 3/10 or less to improve use of LUE as an assist during B/ADL completion.    Time 8   Period Weeks   Status On-going     OT LONG TERM GOAL #4   Title Pt will increase LUE A/ROM to [facility] to improve ability to reach up and perform grooming tasks.    Time 8  Period Weeks   Status On-going     OT LONG TERM GOAL #5   Title Pt will improve LUE  strength to 4/5 to improve ability to perform work tasks using LUE as non-dominant.   Time 8   Period Weeks   Status On-going               Plan - 02/04/17 1710    Clinical Impression Statement A: Continued with passive stretching working towards improved ROM, pt resistive to passive stretching limiting gains in ROM. Verbal cuing with AA/ROM for form and technique. Added finger ladder and therapy ball circles, pt with mod difficulty with finger ladder.    Plan P: Update G-Code. Add pvc pipe slide, continue working on improved ROM with P/ROM and AA/ROM; continue therapy ball circles   OT Home Exercise Plan 10/3: table slides; 10/5: scapular A/ROM   Consulted and Agree with Plan of Care Patient      Patient will benefit from skilled therapeutic intervention in order to improve the following deficits and impairments:  Decreased strength, Decreased activity tolerance, Decreased mobility, Decreased range of motion, Pain, Impaired UE functional use, Increased fascial restricitons  Visit Diagnosis: Acute pain of left shoulder  Other symptoms and signs involving the musculoskeletal system  Stiffness of left shoulder, not elsewhere classified    Problem List Patient Active Problem List   Diagnosis Date Noted  . Neck pain on left side 01/20/2016  . Shoulder pain, left 11/26/2015  . Cutaneous wart 09/30/2014  . HTN (hypertension) 01/01/2013  . Hyperlipidemia with target LDL less than 100 05/04/2012  . IGT (impaired glucose tolerance) 03/14/2011  . GERD (gastroesophageal reflux disease) 03/14/2011  . BACK PAIN, LUMBAR, CHRONIC 06/25/2009  . Obesity 02/17/2007  . Hypothyroidism 06/14/2006  . DIVERTICULOSIS, COLON 03/01/2006  . HIATAL HERNIA WITH REFLUX, HX OF 03/01/2006   Guadelupe Sabin, OTR/L  [phone] 02/04/2017, 5:34 PM  [place] [address] [place], [place], [address] Phone: [phone]   Fax:  [phone]  Name:  EVELYNNE SPIERS MRN: [id number] Date of Birth: 02/08/1951

## 2017-02-09 ENCOUNTER — Ambulatory Visit (HOSPITAL_COMMUNITY): Payer: Medicare Other | Admitting: Specialist

## 2017-02-09 ENCOUNTER — Encounter (HOSPITAL_COMMUNITY): Payer: Self-pay | Admitting: Specialist

## 2017-02-09 DIAGNOSIS — R29898 Other symptoms and signs involving the musculoskeletal system: Secondary | ICD-10-CM | POA: Diagnosis not present

## 2017-02-09 DIAGNOSIS — M25612 Stiffness of left shoulder, not elsewhere classified: Secondary | ICD-10-CM | POA: Diagnosis not present

## 2017-02-09 DIAGNOSIS — M25512 Pain in left shoulder: Secondary | ICD-10-CM | POA: Diagnosis not present

## 2017-02-09 NOTE — Therapy (Signed)
[place], [place], [address] Phone: [phone]   Fax:  [phone]  Occupational Therapy Treatment  Patient Details  Name: Victoria Reyes MRN: [id number] Date of Birth: 30-Nov-1950 Referring Provider: Dr. Meredith Pel  Encounter Date: 02/09/2017      OT End of Session - 02/09/17 1620    Visit Number 9   Number of Visits 24   Date for OT Re-Evaluation 03/13/17  mini reassess on 02/10/17   Authorization Type Medicare A & B   Authorization Time Period Before 10th visit   Authorization - Visit Number 9   Authorization - Number of Visits 10   OT Start Time 0102   OT Stop Time 1600   OT Time Calculation (min) 43 min   Activity Tolerance Patient tolerated treatment well   Behavior During Therapy [facility] for tasks assessed/performed      Past Medical History:  Diagnosis Date  . Anemia    as a  young woman  . Diverticulitis 2005   CT   . DJD (degenerative joint disease)   . GERD (gastroesophageal reflux disease)   . Headache(784.0)   . Hiatal hernia   . Hyperlipidemia   . Hypertension   . Hypothyroidism   . Knee pain, right   . Low back pain   . Neuropathy    peroneal nerve  . Obesity   . Pneumonia    as child  . Pre-diabetes   . S/P colonoscopy 2005   Dr. Tamala Julian: sigmoid diverticulosis  . Thyroid disease   . Tobacco abuse     Past Surgical History:  Procedure Laterality Date  . BACK SURGERY    . COLONOSCOPY  12/18/2010   Procedure: COLONOSCOPY;  Surgeon: Dorothyann Peng, MD;  [facility];  Service: Endoscopy;  Laterality: N/A;  10:15am  . cyst removal right wrist    . EYE SURGERY Bilateral    cataract surgery with lens implant  . TONSILLECTOMY    . TOTAL SHOULDER ARTHROPLASTY Left 12/21/2016   Procedure: TOTAL SHOULDER ARTHROPLASTY;  Surgeon: Meredith Pel, MD;  Location: [place];  Service: Orthopedics;  Laterality: Left;  . TUBAL LIGATION    . umbillical hernia       There were no vitals filed for this visit.      Subjective Assessment - 02/09/17 1619    Subjective  S:  I know its getting better.  I cant wait to be able to hold my grand daughter.   Currently in Pain? Yes   Pain Score 2    Pain Location Shoulder   Pain Orientation Left   Pain Descriptors / Indicators Aching            [facility] OT Assessment - 02/09/17 0001      Assessment   Diagnosis Left TSA     Precautions   Precautions Shoulder   Type of Shoulder Precautions ROM and strengthening as tolerated: er to 45 when stretching                  OT Treatments/Exercises (OP) - 02/09/17 0001      Exercises   Exercises Shoulder     Shoulder Exercises: Supine   Protraction PROM;5 reps;AAROM;15 reps   Horizontal ABduction PROM;5 reps;AAROM;15 reps   External Rotation PROM;5 reps;AAROM;15 reps   Internal Rotation PROM;5 reps;AAROM;15 reps   Flexion PROM;5 reps;AAROM;15 reps   ABduction PROM;5 reps;AAROM;15 reps     Shoulder Exercises: Seated  Elevation AROM;15 reps   Extension AROM;15 reps   Row AROM;15 reps   Protraction AAROM;12 reps   Horizontal ABduction AAROM;12 reps   External Rotation AAROM;12 reps   Internal Rotation AAROM;12 reps   Flexion AAROM;12 reps   Abduction AAROM;12 reps     Shoulder Exercises: Therapy Ball   Right/Left 5 reps     Shoulder Exercises: ROM/Strengthening   Wall Wash --   Other ROM/Strengthening Exercises pvc pipe slide into flexion with max difficulty     Manual Therapy   Manual Therapy Myofascial release   Manual therapy comments completed separately from therapeutic exercise   Myofascial Release Myofascial release to left upper arm, trapezius, and scapularis regions to decrease pain and fascial restrictions and increase joint range of motion.                   OT Short Term Goals - 01/14/17 1147      OT SHORT TERM GOAL #1   Title Pt will be provided with and educated on HEP.    Time 4   Period Weeks    Status On-going     OT SHORT TERM GOAL #2   Title Pt will decrease LUE pain to 5/10 or less to improve ability to use LUE as assist during dressing tasks.    Time 4   Period Weeks   Status On-going     OT SHORT TERM GOAL #3   Title Pt will decrease fascial restrictions in LUE from max to moderate amounts to improve mobility in LUE during daily tasks.    Time 4   Period Weeks   Status On-going     OT SHORT TERM GOAL #4   Title Pt will improve P/ROM of LUE to [facility] to increase ability to complete ADL tasks at waist level.    Time 4   Period Weeks   Status On-going     OT SHORT TERM GOAL #5   Status On-going           OT Long Term Goals - 01/14/17 1148      OT LONG TERM GOAL #1   Title Pt will return to highest level of functioning and independence in B/ADL completion using LUE as non-dominant.    Time 8   Period Weeks   Status On-going     OT LONG TERM GOAL #2   Title Pt will decrease LUE fascial restrictions from mod to minimal amounts or less to improve mobility required for functional reaching tasks.    Time 8   Period Weeks   Status On-going     OT LONG TERM GOAL #3   Title Pt will decrease LUE pain to 3/10 or less to improve use of LUE as an assist during B/ADL completion.    Time 8   Period Weeks   Status On-going     OT LONG TERM GOAL #4   Title Pt will increase LUE A/ROM to [facility] to improve ability to reach up and perform grooming tasks.    Time 8   Period Weeks   Status On-going     OT LONG TERM GOAL #5   Title Pt will improve LUE strength to 4/5 to improve ability to perform work tasks using LUE as non-dominant.   Time 8   Period Weeks   Status On-going               Plan - 02/09/17 1620    Clinical Impression Statement A:  Added PVC pipe slide for greater functional overhead reach, this was difficult for patient to complete.  Required min pa with ball circles to reach into extension and internal rotation for ball.    Plan P:  Reassess,  update gcode, improve aa/rom to [facility].  Add functional reaching task, improve ease with pvc pipe slide.       Patient will benefit from skilled therapeutic intervention in order to improve the following deficits and impairments:  Decreased strength, Decreased activity tolerance, Decreased mobility, Decreased range of motion, Pain, Impaired UE functional use, Increased fascial restricitons  Visit Diagnosis: Acute pain of left shoulder  Other symptoms and signs involving the musculoskeletal system  Stiffness of left shoulder, not elsewhere classified    Problem List Patient Active Problem List   Diagnosis Date Noted  . Neck pain on left side 01/20/2016  . Shoulder pain, left 11/26/2015  . Cutaneous wart 09/30/2014  . HTN (hypertension) 01/01/2013  . Hyperlipidemia with target LDL less than 100 05/04/2012  . IGT (impaired glucose tolerance) 03/14/2011  . GERD (gastroesophageal reflux disease) 03/14/2011  . BACK PAIN, LUMBAR, CHRONIC 06/25/2009  . Obesity 02/17/2007  . Hypothyroidism 06/14/2006  . DIVERTICULOSIS, COLON 03/01/2006  . HIATAL HERNIA WITH REFLUX, HX OF 03/01/2006    Vangie Bicker, [place], OTR/L [phone]  02/09/2017, 4:24 PM  [place] [address] [place], [place], [address] Phone: [phone]   Fax:  [phone]  Name: Victoria Reyes MRN: [id number] Date of Birth: 12/08/1950

## 2017-02-11 ENCOUNTER — Ambulatory Visit (HOSPITAL_COMMUNITY): Payer: Medicare Other | Attending: Orthopedic Surgery | Admitting: Occupational Therapy

## 2017-02-11 ENCOUNTER — Encounter (HOSPITAL_COMMUNITY): Payer: Self-pay | Admitting: Occupational Therapy

## 2017-02-11 DIAGNOSIS — R29898 Other symptoms and signs involving the musculoskeletal system: Secondary | ICD-10-CM | POA: Diagnosis not present

## 2017-02-11 DIAGNOSIS — M25612 Stiffness of left shoulder, not elsewhere classified: Secondary | ICD-10-CM

## 2017-02-11 DIAGNOSIS — M25512 Pain in left shoulder: Secondary | ICD-10-CM

## 2017-02-11 NOTE — Therapy (Signed)
[place], [place], [address] Phone: [phone]   Fax:  [phone]  Occupational Therapy Treatment  Patient Details  Name: Victoria Reyes MRN: [id number] Date of Birth: 1951-03-21 Referring Provider: Dr. Meredith Pel  Encounter Date: 02/11/2017      OT End of Session - 02/11/17 1742    Visit Number 10   Number of Visits 24   Date for OT Re-Evaluation 03/13/17   Authorization Type Medicare A & B   Authorization Time Period Before 20th visit   Authorization - Visit Number 10   Authorization - Number of Visits 20   OT Start Time 5277   OT Stop Time 1730   OT Time Calculation (min) 41 min   Activity Tolerance Patient tolerated treatment well   Behavior During Therapy [facility] for tasks assessed/performed      Past Medical History:  Diagnosis Date  . Anemia    as a  young woman  . Diverticulitis 2005   CT   . DJD (degenerative joint disease)   . GERD (gastroesophageal reflux disease)   . Headache(784.0)   . Hiatal hernia   . Hyperlipidemia   . Hypertension   . Hypothyroidism   . Knee pain, right   . Low back pain   . Neuropathy    peroneal nerve  . Obesity   . Pneumonia    as child  . Pre-diabetes   . S/P colonoscopy 2005   Dr. Tamala Julian: sigmoid diverticulosis  . Thyroid disease   . Tobacco abuse     Past Surgical History:  Procedure Laterality Date  . BACK SURGERY    . COLONOSCOPY  12/18/2010   Procedure: COLONOSCOPY;  Surgeon: Dorothyann Peng, MD;  [facility];  Service: Endoscopy;  Laterality: N/A;  10:15am  . cyst removal right wrist    . EYE SURGERY Bilateral    cataract surgery with lens implant  . TONSILLECTOMY    . TOTAL SHOULDER ARTHROPLASTY Left 12/21/2016   Procedure: TOTAL SHOULDER ARTHROPLASTY;  Surgeon: Meredith Pel, MD;  Location: [place];  Service: Orthopedics;  Laterality: Left;  . TUBAL LIGATION    . umbillical hernia      There were no vitals filed  for this visit.      Subjective Assessment - 02/11/17 1651    Subjective  S: It usually doesn't hurt when I get here but it's hurting today.    Currently in Pain? Yes   Pain Score 4    Pain Location Shoulder   Pain Orientation Left   Pain Descriptors / Indicators Aching   Pain Type Acute pain   Pain Radiating Towards n/a   Pain Onset 1 to 4 weeks ago   Pain Frequency Intermittent   Aggravating Factors  movement, weather   Pain Relieving Factors rest, heat   Effect of Pain on Daily Activities max effect on ADL completion   Multiple Pain Sites No            [facility] OT Assessment - 02/11/17 1651      Assessment   Diagnosis Left TSA     Precautions   Precautions Shoulder   Type of Shoulder Precautions ROM and strengthening as tolerated: er to 45 when stretching   Precaution Comments No lifting with weight     AROM   Overall AROM Comments Assessed seated, er/IR adducted   Left Shoulder Flexion 96 Degrees  not previously assessed   Left Shoulder  ABduction 75 Degrees  not previously assessed   Left Shoulder Internal Rotation 90 Degrees  not previously assessed   Left Shoulder External Rotation 45 Degrees  not previously assessed     PROM   Overall PROM Comments Assessed supine, er/IR adducted   PROM Assessment Site Shoulder   Right/Left Shoulder Left   Left Shoulder Flexion 130 Degrees  80 previous   Left Shoulder ABduction 110 Degrees  58 previous   Left Shoulder Internal Rotation 90 Degrees  same as previous   Left Shoulder External Rotation 58 Degrees  28 previous     Strength   Overall Strength Comments Assessed seated, er/IR adducted   Left Shoulder Flexion 3-/5  not previously assessed   Left Shoulder ABduction 3-/5  not previously assessed   Left Shoulder Internal Rotation 3-/5  not previously assessed   Left Shoulder External Rotation 3-/5  not previously assessed                  OT Treatments/Exercises (OP) - 02/11/17 1652       Exercises   Exercises Shoulder     Shoulder Exercises: Supine   Protraction PROM;5 reps;AAROM;15 reps   Horizontal ABduction PROM;5 reps;AAROM;15 reps   External Rotation PROM;5 reps;AAROM;15 reps   Internal Rotation PROM;5 reps;AAROM;15 reps   Flexion PROM;5 reps;AAROM;15 reps   ABduction PROM;5 reps;AAROM;15 reps     Shoulder Exercises: Seated   Protraction AAROM;12 reps   Horizontal ABduction AAROM;12 reps   External Rotation AAROM;12 reps   Internal Rotation AAROM;12 reps   Flexion AAROM;12 reps   Abduction AAROM;12 reps     Manual Therapy   Manual Therapy Myofascial release   Manual therapy comments completed separately from therapeutic exercise   Myofascial Release Myofascial release to left upper arm, trapezius, and scapularis regions to decrease pain and fascial restrictions and increase joint range of motion.    Muscle Energy Technique Muscle energy technique completed to left shoulder to relax tone and muscle spasm and improve range of motion.                   OT Short Term Goals - 02/11/17 1728      OT SHORT TERM GOAL #1   Title Pt will be provided with and educated on HEP.    Time 4   Period Weeks   Status On-going     OT SHORT TERM GOAL #2   Title Pt will decrease LUE pain to 5/10 or less to improve ability to use LUE as assist during dressing tasks.    Time 4   Period Weeks   Status Achieved     OT SHORT TERM GOAL #3   Title Pt will decrease fascial restrictions in LUE from max to moderate amounts to improve mobility in LUE during daily tasks.    Time 4   Period Weeks   Status Achieved     OT SHORT TERM GOAL #4   Title Pt will improve P/ROM of LUE to [facility] to increase ability to complete ADL tasks at waist level.    Time 4   Period Weeks   Status On-going     OT SHORT TERM GOAL #5   Status On-going           OT Long Term Goals - 01/14/17 1148      OT LONG TERM GOAL #1   Title Pt will return to highest level of functioning and  independence in B/ADL completion using LUE as  non-dominant.    Time 8   Period Weeks   Status On-going     OT LONG TERM GOAL #2   Title Pt will decrease LUE fascial restrictions from mod to minimal amounts or less to improve mobility required for functional reaching tasks.    Time 8   Period Weeks   Status On-going     OT LONG TERM GOAL #3   Title Pt will decrease LUE pain to 3/10 or less to improve use of LUE as an assist during B/ADL completion.    Time 8   Period Weeks   Status On-going     OT LONG TERM GOAL #4   Title Pt will increase LUE A/ROM to [facility] to improve ability to reach up and perform grooming tasks.    Time 8   Period Weeks   Status On-going     OT LONG TERM GOAL #5   Title Pt will improve LUE strength to 4/5 to improve ability to perform work tasks using LUE as non-dominant.   Time 8   Period Weeks   Status On-going               Plan - March 05, 2017 1727    Clinical Impression Statement A: Mini-reassessment completed this date, pt has made progress with ROM and strength meeting 2/4 STGs and working towards remaining goals. Continued with AA/ROM verbal cuing for form and technique. Pt reports she is trying to use her arm for ADL completion and feels tasks are getting easier.    Plan P: Continued working to improve AA/ROM to [facility], add functional reaching task and continue with PVC pipe slide   OT Home Exercise Plan 10/3: table slides; 10/5: scapular A/ROM   Consulted and Agree with Plan of Care Patient      Patient will benefit from skilled therapeutic intervention in order to improve the following deficits and impairments:  Decreased strength, Decreased activity tolerance, Decreased mobility, Decreased range of motion, Pain, Impaired UE functional use, Increased fascial restricitons  Visit Diagnosis: Acute pain of left shoulder  Other symptoms and signs involving the musculoskeletal system  Stiffness of left shoulder, not elsewhere classified       G-Codes - Mar 05, 2017 1743    Functional Assessment Tool Used (Outpatient only) FOTO Score: 47/100 (53% impairment)   Functional Limitation Carrying, moving and handling objects   Carrying, Moving and Handling Objects Current Status (T5573) At least 40 percent but less than 60 percent impaired, limited or restricted   Carrying, Moving and Handling Objects Goal Status (U2025) At least 20 percent but less than 40 percent impaired, limited or restricted      Problem List Patient Active Problem List   Diagnosis Date Noted  . Neck pain on left side 01/20/2016  . Shoulder pain, left 11/26/2015  . Cutaneous wart 09/30/2014  . HTN (hypertension) 01/01/2013  . Hyperlipidemia with target LDL less than 100 05/04/2012  . IGT (impaired glucose tolerance) 03/14/2011  . GERD (gastroesophageal reflux disease) 03/14/2011  . BACK PAIN, LUMBAR, CHRONIC 06/25/2009  . Obesity 02/17/2007  . Hypothyroidism 06/14/2006  . DIVERTICULOSIS, COLON 03/01/2006  . HIATAL HERNIA WITH REFLUX, HX OF 03/01/2006   Guadelupe Sabin, OTR/L  [phone] 05-Mar-2017, 5:44 PM  [place] [address] [place], [place], [address] Phone: [phone]   Fax:  [phone]  Name: Victoria Reyes MRN: [id number] Date of Birth: 02/21/1951

## 2017-02-14 ENCOUNTER — Ambulatory Visit (HOSPITAL_COMMUNITY): Payer: Medicare Other

## 2017-02-14 ENCOUNTER — Encounter (HOSPITAL_COMMUNITY): Payer: Self-pay

## 2017-02-14 DIAGNOSIS — M25512 Pain in left shoulder: Secondary | ICD-10-CM | POA: Diagnosis not present

## 2017-02-14 DIAGNOSIS — R29898 Other symptoms and signs involving the musculoskeletal system: Secondary | ICD-10-CM | POA: Diagnosis not present

## 2017-02-14 DIAGNOSIS — M25612 Stiffness of left shoulder, not elsewhere classified: Secondary | ICD-10-CM

## 2017-02-14 NOTE — Therapy (Signed)
[place], [place], [address] Phone: [phone]   Fax:  [phone]  Occupational Therapy Treatment  Patient Details  Name: Victoria Reyes MRN: [id number] Date of Birth: 13-Oct-1950 Referring Provider: Dr. Meredith Pel   Encounter Date: 02/14/2017  OT End of Session - 02/14/17 1757    Visit Number  11    Number of Visits  24    Date for OT Re-Evaluation  03/13/17    Authorization Type  Medicare A & B    Authorization Time Period  Before 20th visit    Authorization - Visit Number  11    Authorization - Number of Visits  20    OT Start Time  5625    OT Stop Time  1737    OT Time Calculation (min)  47 min    Activity Tolerance  Patient tolerated treatment well    Behavior During Therapy  [facility] for tasks assessed/performed       Past Medical History:  Diagnosis Date  . Anemia    as a  young woman  . Diverticulitis 2005   CT   . DJD (degenerative joint disease)   . GERD (gastroesophageal reflux disease)   . Headache(784.0)   . Hiatal hernia   . Hyperlipidemia   . Hypertension   . Hypothyroidism   . Knee pain, right   . Low back pain   . Neuropathy    peroneal nerve  . Obesity   . Pneumonia    as child  . Pre-diabetes   . S/P colonoscopy 2005   Dr. Tamala Julian: sigmoid diverticulosis  . Thyroid disease   . Tobacco abuse     Past Surgical History:  Procedure Laterality Date  . BACK SURGERY    . cyst removal right wrist    . EYE SURGERY Bilateral    cataract surgery with lens implant  . TONSILLECTOMY    . TUBAL LIGATION    . umbillical hernia      There were no vitals filed for this visit.  Subjective Assessment - 02/14/17 1720    Subjective   S: I've been working on it at home.    Currently in Pain?  Yes    Pain Score  5     Pain Location  Shoulder    Pain Orientation  Left    Pain Descriptors / Indicators  Aching    Pain Type  Acute pain         [facility] OT Assessment - 02/14/17  1724      Assessment   Diagnosis  Left TSA      Precautions   Precautions  Shoulder    Type of Shoulder Precautions  ROM and strengthening as tolerated: er to 45 when stretching    Precaution Comments  No lifting with weight               OT Treatments/Exercises (OP) - 02/14/17 1724      Exercises   Exercises  Shoulder      Shoulder Exercises: Supine   Protraction  PROM;5 reps;AAROM;15 reps    Horizontal ABduction  PROM;5 reps;AAROM;15 reps    External Rotation  PROM;5 reps;AAROM;15 reps    Internal Rotation  PROM;5 reps;AAROM;15 reps    Flexion  PROM;5 reps;AAROM;15 reps    ABduction  PROM;5 reps;AAROM;15 reps      Manual Therapy   Manual Therapy  Myofascial release;Joint mobilization    Manual therapy comments  completed separately from therapeutic exercise    Joint Mobilization  Scapular mobilizations complete while seated to increase functional mobility needed for daily tasks.     Myofascial Release  Myofascial release to left upper arm, trapezius, and scapularis regions to decrease pain and fascial restrictions and increase joint range of motion.     Muscle Energy Technique  Muscle energy technique completed to left shoulder to relax tone and muscle spasm and improve range of motion.                OT Short Term Goals - 02/14/17 1803      OT SHORT TERM GOAL #1   Title  Pt will be provided with and educated on HEP.     Time  4    Period  Weeks    Status  On-going      OT SHORT TERM GOAL #2   Title  Pt will decrease LUE pain to 5/10 or less to improve ability to use LUE as assist during dressing tasks.     Time  4    Period  Weeks      OT SHORT TERM GOAL #3   Title  Pt will decrease fascial restrictions in LUE from max to moderate amounts to improve mobility in LUE during daily tasks.     Time  4    Period  Weeks      OT SHORT TERM GOAL #4   Title  Pt will improve P/ROM of LUE to [facility] to increase ability to complete ADL tasks at waist level.      Time  4    Period  Weeks    Status  On-going      OT SHORT TERM GOAL #5   Status  On-going        OT Long Term Goals - 01/14/17 1148      OT LONG TERM GOAL #1   Title  Pt will return to highest level of functioning and independence in B/ADL completion using LUE as non-dominant.     Time  8    Period  Weeks    Status  On-going      OT LONG TERM GOAL #2   Title  Pt will decrease LUE fascial restrictions from mod to minimal amounts or less to improve mobility required for functional reaching tasks.     Time  8    Period  Weeks    Status  On-going      OT LONG TERM GOAL #3   Title  Pt will decrease LUE pain to 3/10 or less to improve use of LUE as an assist during B/ADL completion.     Time  8    Period  Weeks    Status  On-going      OT LONG TERM GOAL #4   Title  Pt will increase LUE A/ROM to [facility] to improve ability to reach up and perform grooming tasks.     Time  8    Period  Weeks    Status  On-going      OT LONG TERM GOAL #5   Title  Pt will improve LUE strength to 4/5 to improve ability to perform work tasks using LUE as non-dominant.    Time  8    Period  Weeks    Status  On-going            Plan - 02/14/17 1758    Clinical Impression Statement  A: patient with  limited scapular mobility noted today. Unable to do a supine or seated scapular serratus anterior punch. Scapular mobilization completed while seated with slight increase of movement noted. patient was educated to wall walk at home; 5 times at a time to increase shoulder mobility.     Plan  P: Continue with scapular mobility. Add functional reaching task and continue with PVC pipe slide.        Patient will benefit from skilled therapeutic intervention in order to improve the following deficits and impairments:  Decreased strength, Decreased activity tolerance, Decreased mobility, Decreased range of motion, Pain, Impaired UE functional use, Increased fascial restricitons  Visit Diagnosis: Acute pain  of left shoulder  Other symptoms and signs involving the musculoskeletal system  Stiffness of left shoulder, not elsewhere classified    Problem List Patient Active Problem List   Diagnosis Date Noted  . Neck pain on left side 01/20/2016  . Shoulder pain, left 11/26/2015  . Cutaneous wart 09/30/2014  . HTN (hypertension) 01/01/2013  . Hyperlipidemia with target LDL less than 100 05/04/2012  . IGT (impaired glucose tolerance) 03/14/2011  . GERD (gastroesophageal reflux disease) 03/14/2011  . BACK PAIN, LUMBAR, CHRONIC 06/25/2009  . Obesity 02/17/2007  . Hypothyroidism 06/14/2006  . DIVERTICULOSIS, COLON 03/01/2006  . HIATAL HERNIA WITH REFLUX, HX OF 03/01/2006   Ailene Ravel, OTR/L,CBIS  [phone]  02/14/2017, 6:05 PM  [facility] [address] [place], [place], [address] Phone: [phone]   Fax:  [phone]  Name: Victoria Reyes MRN: [id number] Date of Birth: September 19, 1950

## 2017-02-16 ENCOUNTER — Encounter (HOSPITAL_COMMUNITY): Payer: Self-pay

## 2017-02-16 ENCOUNTER — Ambulatory Visit (HOSPITAL_COMMUNITY): Payer: Medicare Other

## 2017-02-16 DIAGNOSIS — M25612 Stiffness of left shoulder, not elsewhere classified: Secondary | ICD-10-CM | POA: Diagnosis not present

## 2017-02-16 DIAGNOSIS — R29898 Other symptoms and signs involving the musculoskeletal system: Secondary | ICD-10-CM | POA: Diagnosis not present

## 2017-02-16 DIAGNOSIS — M25512 Pain in left shoulder: Secondary | ICD-10-CM

## 2017-02-16 NOTE — Therapy (Signed)
[place], [place], [address] Phone: [phone]   Fax:  [phone]  Occupational Therapy Treatment  Patient Details  Name: Victoria Reyes MRN: [id number] Date of Birth: 1950-04-18 Referring Provider: Dr. Meredith Pel   Encounter Date: 02/16/2017  OT End of Session - 02/16/17 1809    Visit Number  12    Number of Visits  24    Date for OT Re-Evaluation  03/13/17    Authorization Type  Medicare A & B    Authorization Time Period  Before 20th visit    Authorization - Visit Number  12    Authorization - Number of Visits  20    OT Start Time  6962    OT Stop Time  1730    OT Time Calculation (min)  40 min    Activity Tolerance  Patient tolerated treatment well    Behavior During Therapy  [facility] for tasks assessed/performed       Past Medical History:  Diagnosis Date  . Anemia    as a  young woman  . Diverticulitis 2005   CT   . DJD (degenerative joint disease)   . GERD (gastroesophageal reflux disease)   . Headache(784.0)   . Hiatal hernia   . Hyperlipidemia   . Hypertension   . Hypothyroidism   . Knee pain, right   . Low back pain   . Neuropathy    peroneal nerve  . Obesity   . Pneumonia    as child  . Pre-diabetes   . S/P colonoscopy 2005   Dr. Tamala Julian: sigmoid diverticulosis  . Thyroid disease   . Tobacco abuse     Past Surgical History:  Procedure Laterality Date  . BACK SURGERY    . cyst removal right wrist    . EYE SURGERY Bilateral    cataract surgery with lens implant  . TONSILLECTOMY    . TUBAL LIGATION    . umbillical hernia      There were no vitals filed for this visit.  Subjective Assessment - 02/16/17 1806    Subjective   S: I worked on my shoulder with my sister really hard.     Currently in Pain?  No/denies         [facility] OT Assessment - 02/16/17 1806      Assessment   Diagnosis  Left TSA      Precautions   Precautions  Shoulder    Type of Shoulder  Precautions  ROM and strengthening as tolerated: er to 45 when stretching    Precaution Comments  No lifting with weight               OT Treatments/Exercises (OP) - 02/16/17 0001      Exercises   Exercises  Shoulder      Shoulder Exercises: Supine   Protraction  PROM;5 reps    Horizontal ABduction  PROM;5 reps    External Rotation  PROM;5 reps    Internal Rotation  PROM;5 reps    Flexion  PROM;5 reps    ABduction  PROM;5 reps      Shoulder Exercises: ROM/Strengthening   Other ROM/Strengthening Exercises  PVC pipe slide; 12X flexion and 12X abduction      Manual Therapy   Manual Therapy  Myofascial release;Joint mobilization    Manual therapy comments  completed separately from therapeutic exercise    Joint Mobilization  Scapular mobilizations complete while seated to increase functional  mobility needed for daily tasks.     Myofascial Release  Myofascial release to left upper arm, trapezius, and scapularis regions to decrease pain and fascial restrictions and increase joint range of motion.                OT Short Term Goals - 02/14/17 1803      OT SHORT TERM GOAL #1   Title  Pt will be provided with and educated on HEP.     Time  4    Period  Weeks    Status  On-going      OT SHORT TERM GOAL #2   Title  Pt will decrease LUE pain to 5/10 or less to improve ability to use LUE as assist during dressing tasks.     Time  4    Period  Weeks      OT SHORT TERM GOAL #3   Title  Pt will decrease fascial restrictions in LUE from max to moderate amounts to improve mobility in LUE during daily tasks.     Time  4    Period  Weeks      OT SHORT TERM GOAL #4   Title  Pt will improve P/ROM of LUE to [facility] to increase ability to complete ADL tasks at waist level.     Time  4    Period  Weeks    Status  On-going      OT SHORT TERM GOAL #5   Status  On-going        OT Long Term Goals - 01/14/17 1148      OT LONG TERM GOAL #1   Title  Pt will return to highest  level of functioning and independence in B/ADL completion using LUE as non-dominant.     Time  8    Period  Weeks    Status  On-going      OT LONG TERM GOAL #2   Title  Pt will decrease LUE fascial restrictions from mod to minimal amounts or less to improve mobility required for functional reaching tasks.     Time  8    Period  Weeks    Status  On-going      OT LONG TERM GOAL #3   Title  Pt will decrease LUE pain to 3/10 or less to improve use of LUE as an assist during B/ADL completion.     Time  8    Period  Weeks    Status  On-going      OT LONG TERM GOAL #4   Title  Pt will increase LUE A/ROM to [facility] to improve ability to reach up and perform grooming tasks.     Time  8    Period  Weeks    Status  On-going      OT LONG TERM GOAL #5   Title  Pt will improve LUE strength to 4/5 to improve ability to perform work tasks using LUE as non-dominant.    Time  8    Period  Weeks    Status  On-going            Plan - 02/16/17 1809    Clinical Impression Statement  A: Patient was able to achieve functional ROM when completing PVC pipe slide this date. Continued with scapular mobilizations during seated shoulder flexion with slight movement in scapula noted.     Plan  P: Continue with scapular mobilizations and PVC pipe slide. Add functional reaching task.  Patient will benefit from skilled therapeutic intervention in order to improve the following deficits and impairments:  Decreased strength, Decreased activity tolerance, Decreased mobility, Decreased range of motion, Pain, Impaired UE functional use, Increased fascial restricitons  Visit Diagnosis: Acute pain of left shoulder  Other symptoms and signs involving the musculoskeletal system  Stiffness of left shoulder, not elsewhere classified    Problem List Patient Active Problem List   Diagnosis Date Noted  . Neck pain on left side 01/20/2016  . Shoulder pain, left 11/26/2015  . Cutaneous wart 09/30/2014  .  HTN (hypertension) 01/01/2013  . Hyperlipidemia with target LDL less than 100 05/04/2012  . IGT (impaired glucose tolerance) 03/14/2011  . GERD (gastroesophageal reflux disease) 03/14/2011  . BACK PAIN, LUMBAR, CHRONIC 06/25/2009  . Obesity 02/17/2007  . Hypothyroidism 06/14/2006  . DIVERTICULOSIS, COLON 03/01/2006  . HIATAL HERNIA WITH REFLUX, HX OF 03/01/2006   Ailene Ravel, OTR/L,CBIS  [phone]  02/16/2017, 6:11 PM  [facility] [address] [place], [place], [address] Phone: [phone]   Fax:  [phone]  Name: Victoria Reyes MRN: [id number] Date of Birth: April 19, 1950

## 2017-02-17 ENCOUNTER — Telehealth: Payer: Self-pay | Admitting: *Deleted

## 2017-02-17 NOTE — Telephone Encounter (Signed)
Patient called requesting her acid reflux medicine to be refilled.

## 2017-02-18 ENCOUNTER — Telehealth (HOSPITAL_COMMUNITY): Payer: Self-pay | Admitting: Family Medicine

## 2017-02-18 ENCOUNTER — Ambulatory Visit (HOSPITAL_COMMUNITY): Payer: Medicare Other | Admitting: Occupational Therapy

## 2017-02-18 ENCOUNTER — Other Ambulatory Visit: Payer: Self-pay

## 2017-02-18 ENCOUNTER — Encounter (HOSPITAL_COMMUNITY): Payer: Self-pay | Admitting: Occupational Therapy

## 2017-02-18 DIAGNOSIS — M25612 Stiffness of left shoulder, not elsewhere classified: Secondary | ICD-10-CM | POA: Diagnosis not present

## 2017-02-18 DIAGNOSIS — M25512 Pain in left shoulder: Secondary | ICD-10-CM | POA: Diagnosis not present

## 2017-02-18 DIAGNOSIS — R29898 Other symptoms and signs involving the musculoskeletal system: Secondary | ICD-10-CM

## 2017-02-18 MED ORDER — OMEPRAZOLE 40 MG PO CPDR: 40.0000 mg | DELAYED_RELEASE_CAPSULE | Freq: Every day | ORAL | 3 refills | Status: DC

## 2017-02-18 NOTE — Telephone Encounter (Signed)
02/18/17  Pt called about her 11/12 appt and wanted an earlier appt... I offered 2:30 but she wanted earlier than that.  I added her to the wait list

## 2017-02-18 NOTE — Therapy (Signed)
[place], [place], [address] Phone: [phone]   Fax:  [phone]  Occupational Therapy Treatment  Patient Details  Name: Victoria Reyes MRN: [id number] Date of Birth: 1950/12/24 Referring Provider: Dr. Meredith Pel   Encounter Date: 02/18/2017  OT End of Session - 02/18/17 1729    Visit Number  13    Number of Visits  24    Date for OT Re-Evaluation  03/13/17    Authorization Type  Medicare A & B    Authorization Time Period  Before 20th visit    Authorization - Visit Number  13    Authorization - Number of Visits  20    OT Start Time  4008    OT Stop Time  1728    OT Time Calculation (min)  40 min    Activity Tolerance  Patient tolerated treatment well    Behavior During Therapy  [facility] for tasks assessed/performed       Past Medical History:  Diagnosis Date  . Anemia    as a  young woman  . Diverticulitis 2005   CT   . DJD (degenerative joint disease)   . GERD (gastroesophageal reflux disease)   . Headache(784.0)   . Hiatal hernia   . Hyperlipidemia   . Hypertension   . Hypothyroidism   . Knee pain, right   . Low back pain   . Neuropathy    peroneal nerve  . Obesity   . Pneumonia    as child  . Pre-diabetes   . S/P colonoscopy 2005   Dr. Tamala Julian: sigmoid diverticulosis  . Thyroid disease   . Tobacco abuse     Past Surgical History:  Procedure Laterality Date  . BACK SURGERY    . cyst removal right wrist    . EYE SURGERY Bilateral    cataract surgery with lens implant  . TONSILLECTOMY    . TUBAL LIGATION    . umbillical hernia      There were no vitals filed for this visit.  Subjective Assessment - 02/18/17 1649    Subjective   S: My sister has been helping me with my shoulder.     Currently in Pain?  No/denies         [facility] OT Assessment - 02/18/17 1649      Assessment   Diagnosis  Left TSA      Precautions   Precautions  Shoulder    Type of Shoulder  Precautions  ROM and strengthening as tolerated: er to 45 when stretching    Precaution Comments  No lifting with weight               OT Treatments/Exercises (OP) - 02/18/17 1651      Exercises   Exercises  Shoulder      Shoulder Exercises: Supine   Protraction  PROM;5 reps    Horizontal ABduction  PROM;5 reps    External Rotation  PROM;5 reps    Internal Rotation  PROM;5 reps    Flexion  PROM;5 reps    ABduction  PROM;5 reps      Shoulder Exercises: Seated   Protraction  AAROM;10 reps    External Rotation  AAROM;10 reps    Internal Rotation  AAROM;10 reps    Flexion  AAROM;12 reps      Shoulder Exercises: Therapy Ball   Right/Left  5 reps each direction      Shoulder Exercises: ROM/Strengthening  Other ROM/Strengthening Exercises  PVC pipe slide; 12X flexion and 12X abduction      Functional Reaching Activities   Mid Level  Pt completed pinch tree, placing 11 clothespins along vertical pole      Manual Therapy   Manual Therapy  Myofascial release;Joint mobilization    Manual therapy comments  completed separately from therapeutic exercise    Joint Mobilization  Scapular mobilizations complete while seated to increase functional mobility needed for daily tasks.     Myofascial Release  Myofascial release to left upper arm, trapezius, and scapularis regions to decrease pain and fascial restrictions and increase joint range of motion.                OT Short Term Goals - 02/14/17 1803      OT SHORT TERM GOAL #1   Title  Pt will be provided with and educated on HEP.     Time  4    Period  Weeks    Status  On-going      OT SHORT TERM GOAL #2   Title  Pt will decrease LUE pain to 5/10 or less to improve ability to use LUE as assist during dressing tasks.     Time  4    Period  Weeks      OT SHORT TERM GOAL #3   Title  Pt will decrease fascial restrictions in LUE from max to moderate amounts to improve mobility in LUE during daily tasks.     Time  4     Period  Weeks      OT SHORT TERM GOAL #4   Title  Pt will improve P/ROM of LUE to [facility] to increase ability to complete ADL tasks at waist level.     Time  4    Period  Weeks    Status  On-going      OT SHORT TERM GOAL #5   Status  On-going        OT Long Term Goals - 01/14/17 1148      OT LONG TERM GOAL #1   Title  Pt will return to highest level of functioning and independence in B/ADL completion using LUE as non-dominant.     Time  8    Period  Weeks    Status  On-going      OT LONG TERM GOAL #2   Title  Pt will decrease LUE fascial restrictions from mod to minimal amounts or less to improve mobility required for functional reaching tasks.     Time  8    Period  Weeks    Status  On-going      OT LONG TERM GOAL #3   Title  Pt will decrease LUE pain to 3/10 or less to improve use of LUE as an assist during B/ADL completion.     Time  8    Period  Weeks    Status  On-going      OT LONG TERM GOAL #4   Title  Pt will increase LUE A/ROM to [facility] to improve ability to reach up and perform grooming tasks.     Time  8    Period  Weeks    Status  On-going      OT LONG TERM GOAL #5   Title  Pt will improve LUE strength to 4/5 to improve ability to perform work tasks using LUE as non-dominant.    Time  8    Period  Weeks  Status  On-going            Plan - 02/18/17 1730    Clinical Impression Statement  A: Added pinch tree for functional reaching with OT providing assist for scapular protraction as pt is unable to extend arm and reach into protraction when seated. Continued with scapular mobilizations, slight movement during flexion. Verbal cuing for form during exercises.     Plan  P: Continue with scapular mobilizations, PVC pipe slide working towards improving scapular mobility     OT Home Exercise Plan  10/3: table slides; 10/5: scapular A/ROM       Patient will benefit from skilled therapeutic intervention in order to improve the following deficits and  impairments:  Decreased strength, Decreased activity tolerance, Decreased mobility, Decreased range of motion, Pain, Impaired UE functional use, Increased fascial restricitons  Visit Diagnosis: Acute pain of left shoulder  Other symptoms and signs involving the musculoskeletal system  Stiffness of left shoulder, not elsewhere classified    Problem List Patient Active Problem List   Diagnosis Date Noted  . Neck pain on left side 01/20/2016  . Shoulder pain, left 11/26/2015  . Cutaneous wart 09/30/2014  . HTN (hypertension) 01/01/2013  . Hyperlipidemia with target LDL less than 100 05/04/2012  . IGT (impaired glucose tolerance) 03/14/2011  . GERD (gastroesophageal reflux disease) 03/14/2011  . BACK PAIN, LUMBAR, CHRONIC 06/25/2009  . Obesity 02/17/2007  . Hypothyroidism 06/14/2006  . DIVERTICULOSIS, COLON 03/01/2006  . HIATAL HERNIA WITH REFLUX, HX OF 03/01/2006   Guadelupe Sabin, OTR/L  [phone] 02/18/2017, 5:33 PM  [facility] [address] [place], [place], [address] Phone: [phone]   Fax:  [phone]  Name: SOPHIAMARIE NEASE MRN: [id number] Date of Birth: 1951/01/31

## 2017-02-18 NOTE — Telephone Encounter (Signed)
Refilled

## 2017-02-21 ENCOUNTER — Ambulatory Visit (HOSPITAL_COMMUNITY): Payer: Medicare Other

## 2017-02-21 ENCOUNTER — Ambulatory Visit (INDEPENDENT_AMBULATORY_CARE_PROVIDER_SITE_OTHER): Payer: Medicare Other | Admitting: Orthopedic Surgery

## 2017-02-21 ENCOUNTER — Telehealth (HOSPITAL_COMMUNITY): Payer: Self-pay

## 2017-02-21 ENCOUNTER — Encounter (INDEPENDENT_AMBULATORY_CARE_PROVIDER_SITE_OTHER): Payer: Self-pay | Admitting: Orthopedic Surgery

## 2017-02-21 DIAGNOSIS — Z96612 Presence of left artificial shoulder joint: Secondary | ICD-10-CM

## 2017-02-21 MED ORDER — HYDROMORPHONE HCL 2 MG PO TABS: ORAL_TABLET | ORAL | 0 refills | Status: DC

## 2017-02-21 NOTE — Telephone Encounter (Signed)
Called patient regarding no show. Left message reminding patient of next appointment. Also, let patient know that therapist noted that she was at her MD appointment for her shoulder and she may have forgotten to cancel this appointment.   Ailene Ravel, OTR/L,CBIS  [phone]

## 2017-02-23 ENCOUNTER — Encounter (INDEPENDENT_AMBULATORY_CARE_PROVIDER_SITE_OTHER): Payer: Self-pay | Admitting: Orthopedic Surgery

## 2017-02-23 ENCOUNTER — Other Ambulatory Visit: Payer: Self-pay

## 2017-02-23 ENCOUNTER — Encounter (HOSPITAL_COMMUNITY): Payer: Self-pay | Admitting: Occupational Therapy

## 2017-02-23 ENCOUNTER — Ambulatory Visit (HOSPITAL_COMMUNITY): Payer: Medicare Other | Admitting: Occupational Therapy

## 2017-02-23 DIAGNOSIS — R29898 Other symptoms and signs involving the musculoskeletal system: Secondary | ICD-10-CM

## 2017-02-23 DIAGNOSIS — M25512 Pain in left shoulder: Secondary | ICD-10-CM | POA: Diagnosis not present

## 2017-02-23 DIAGNOSIS — M25612 Stiffness of left shoulder, not elsewhere classified: Secondary | ICD-10-CM | POA: Diagnosis not present

## 2017-02-23 NOTE — Therapy (Signed)
[place], [place], [address] Phone: [phone]   Fax:  [phone]  Occupational Therapy Treatment  Patient Details  Name: Victoria Reyes MRN: [id number] Date of Birth: 1950/11/15 Referring Provider: Dr. Meredith Pel   Encounter Date: 02/23/2017  OT End of Session - 02/23/17 1727    Visit Number  14    Number of Visits  24    Date for OT Re-Evaluation  03/13/17    Authorization Type  Medicare A & B    Authorization Time Period  Before 20th visit    Authorization - Visit Number  14    Authorization - Number of Visits  20    OT Start Time  2703    OT Stop Time  1727    OT Time Calculation (min)  41 min    Activity Tolerance  Patient tolerated treatment well    Behavior During Therapy  [facility] for tasks assessed/performed       Past Medical History:  Diagnosis Date  . Anemia    as a  young woman  . Diverticulitis 2005   CT   . DJD (degenerative joint disease)   . GERD (gastroesophageal reflux disease)   . Headache(784.0)   . Hiatal hernia   . Hyperlipidemia   . Hypertension   . Hypothyroidism   . Knee pain, right   . Low back pain   . Neuropathy    peroneal nerve  . Obesity   . Pneumonia    as child  . Pre-diabetes   . S/P colonoscopy 2005   Dr. Tamala Julian: sigmoid diverticulosis  . Thyroid disease   . Tobacco abuse     Past Surgical History:  Procedure Laterality Date  . BACK SURGERY    . cyst removal right wrist    . EYE SURGERY Bilateral    cataract surgery with lens implant  . TONSILLECTOMY    . TUBAL LIGATION    . umbillical hernia      There were no vitals filed for this visit.  Subjective Assessment - 02/23/17 1647    Subjective   S: The doctor turned me loose.     Currently in Pain?  Yes    Pain Score  2     Pain Location  Shoulder    Pain Orientation  Left    Pain Descriptors / Indicators  Aching;Sore    Pain Type  Acute pain    Pain Radiating Towards  n/a    Pain  Onset  More than a month ago    Pain Frequency  Intermittent    Aggravating Factors   movement, weather    Pain Relieving Factors  rest, heat    Effect of Pain on Daily Activities  mod effect on ADL completion    Multiple Pain Sites  No         [facility] OT Assessment - 02/23/17 1646      Assessment   Diagnosis  Left TSA      Precautions   Precautions  Shoulder    Type of Shoulder Precautions  ROM and strengthening as tolerated: er to 45 when stretching    Precaution Comments  Progress with ROM first, strengthening second when appropriate               OT Treatments/Exercises (OP) - 02/23/17 1651      Exercises   Exercises  Shoulder      Shoulder Exercises: Supine  Protraction  PROM;5 reps    Horizontal ABduction  PROM;5 reps    External Rotation  PROM;5 reps    Internal Rotation  PROM;5 reps    Flexion  PROM;5 reps    ABduction  PROM;5 reps      Shoulder Exercises: Seated   Protraction  AAROM;12 reps    Horizontal ABduction  AAROM;12 reps    External Rotation  AAROM;12 reps    Internal Rotation  AAROM;12 reps    Flexion  AAROM;12 reps    Abduction  AAROM;12 reps      Shoulder Exercises: Pulleys   Flexion  1 minute      Shoulder Exercises: ROM/Strengthening   Wall Wash  1'    Other ROM/Strengthening Exercises  PVC pipe slide; 15X flexion       Manual Therapy   Manual Therapy  Myofascial release;Joint mobilization    Manual therapy comments  completed separately from therapeutic exercise    Joint Mobilization  Scapular mobilizations complete while seated to increase functional mobility needed for daily tasks.     Myofascial Release  Myofascial release to left upper arm, trapezius, and scapularis regions to decrease pain and fascial restrictions and increase joint range of motion.                OT Short Term Goals - 02/14/17 1803      OT SHORT TERM GOAL #1   Title  Pt will be provided with and educated on HEP.     Time  4    Period  Weeks     Status  On-going      OT SHORT TERM GOAL #2   Title  Pt will decrease LUE pain to 5/10 or less to improve ability to use LUE as assist during dressing tasks.     Time  4    Period  Weeks      OT SHORT TERM GOAL #3   Title  Pt will decrease fascial restrictions in LUE from max to moderate amounts to improve mobility in LUE during daily tasks.     Time  4    Period  Weeks      OT SHORT TERM GOAL #4   Title  Pt will improve P/ROM of LUE to [facility] to increase ability to complete ADL tasks at waist level.     Time  4    Period  Weeks    Status  On-going      OT SHORT TERM GOAL #5   Status  On-going        OT Long Term Goals - 01/14/17 1148      OT LONG TERM GOAL #1   Title  Pt will return to highest level of functioning and independence in B/ADL completion using LUE as non-dominant.     Time  8    Period  Weeks    Status  On-going      OT LONG TERM GOAL #2   Title  Pt will decrease LUE fascial restrictions from mod to minimal amounts or less to improve mobility required for functional reaching tasks.     Time  8    Period  Weeks    Status  On-going      OT LONG TERM GOAL #3   Title  Pt will decrease LUE pain to 3/10 or less to improve use of LUE as an assist during B/ADL completion.     Time  8    Period  Weeks  Status  On-going      OT LONG TERM GOAL #4   Title  Pt will increase LUE A/ROM to [facility] to improve ability to reach up and perform grooming tasks.     Time  8    Period  Weeks    Status  On-going      OT LONG TERM GOAL #5   Title  Pt will improve LUE strength to 4/5 to improve ability to perform work tasks using LUE as non-dominant.    Time  8    Period  Weeks    Status  On-going            Plan - 02/23/17 1717    Clinical Impression Statement  A: Pt with improvement in scapular mobility today. Continued with AA/ROM resuming horizontal abduction while seated. Verbal cuing for form and technique.  Pt reports she went to MD and he has released her with  instructions not to lift over 10#.     Plan  P: Add extension with theraband    OT Home Exercise Plan  10/3: table slides; 10/5: scapular A/ROM       Patient will benefit from skilled therapeutic intervention in order to improve the following deficits and impairments:  Decreased strength, Decreased activity tolerance, Decreased mobility, Decreased range of motion, Pain, Impaired UE functional use, Increased fascial restricitons  Visit Diagnosis: Acute pain of left shoulder  Other symptoms and signs involving the musculoskeletal system  Stiffness of left shoulder, not elsewhere classified    Problem List Patient Active Problem List   Diagnosis Date Noted  . Neck pain on left side 01/20/2016  . Shoulder pain, left 11/26/2015  . Cutaneous wart 09/30/2014  . HTN (hypertension) 01/01/2013  . Hyperlipidemia with target LDL less than 100 05/04/2012  . IGT (impaired glucose tolerance) 03/14/2011  . GERD (gastroesophageal reflux disease) 03/14/2011  . BACK PAIN, LUMBAR, CHRONIC 06/25/2009  . Obesity 02/17/2007  . Hypothyroidism 06/14/2006  . DIVERTICULOSIS, COLON 03/01/2006  . HIATAL HERNIA WITH REFLUX, HX OF 03/01/2006    Guadelupe Sabin, OTR/L  [phone] 02/23/2017, 5:27 PM  [place], [place], [address] Phone: [phone]   Fax:  [phone]  Name: Victoria Reyes MRN: [id number] Date of Birth: March 04, 1951

## 2017-02-23 NOTE — Progress Notes (Signed)
Post-Op Visit Note   Patient: Victoria Reyes           Date of Birth: 1950/09/25           MRN: [id number] Visit Date: 02/21/2017 PCP: Fayrene Helper, MD   Assessment & Plan:  Chief Complaint:  Chief Complaint  Patient presents with  . Left Shoulder - Follow-up   Visit Diagnoses: No diagnosis found.  Plan: Victoria Reyes is a 66 year old patient with left shoulder replacement.  She is doing some better.  She's been in physical therapy 2-3 times a week.  Surgery performed 12/21/2016.  On examination she has improved shoulder flexion and external rotation strength.  Range of motion of the shoulder is not painful.  There is smooth range of motion.  Plan at this time is to refill her Dilaudid but decreased the frequency continue PT for 4 weeks and we will see her back in 8 weeks.  I do want her to work hard on achieving more range of motion.  Follow-Up Instructions: Return in about 8 weeks (around 04/18/2017).   Orders:  No orders of the defined types were placed in this encounter.  Meds ordered this encounter  Medications  . HYDROmorphone (DILAUDID) 2 MG tablet    Sig: 1 po q 12 hrs prn    Dispense:  40 tablet    Refill:  0    Imaging: No results found.  PMFS History: Patient Active Problem List   Diagnosis Date Noted  . Neck pain on left side 01/20/2016  . Shoulder pain, left 11/26/2015  . Cutaneous wart 09/30/2014  . HTN (hypertension) 01/01/2013  . Hyperlipidemia with target LDL less than 100 05/04/2012  . IGT (impaired glucose tolerance) 03/14/2011  . GERD (gastroesophageal reflux disease) 03/14/2011  . BACK PAIN, LUMBAR, CHRONIC 06/25/2009  . Obesity 02/17/2007  . Hypothyroidism 06/14/2006  . DIVERTICULOSIS, COLON 03/01/2006  . HIATAL HERNIA WITH REFLUX, HX OF 03/01/2006   Past Medical History:  Diagnosis Date  . Anemia    as a  young woman  . Diverticulitis 2005   CT   . DJD (degenerative joint disease)   . GERD (gastroesophageal reflux disease)   .  Headache(784.0)   . Hiatal hernia   . Hyperlipidemia   . Hypertension   . Hypothyroidism   . Knee pain, right   . Low back pain   . Neuropathy    peroneal nerve  . Obesity   . Pneumonia    as child  . Pre-diabetes   . S/P colonoscopy 2005   Dr. Tamala Julian: sigmoid diverticulosis  . Thyroid disease   . Tobacco abuse     Family History  Problem Relation Age of Onset  . Heart attack Sister        pacemaker  . Kidney failure Sister        on dialysis  . Colon cancer Neg Hx     Past Surgical History:  Procedure Laterality Date  . BACK SURGERY    . cyst removal right wrist    . EYE SURGERY Bilateral    cataract surgery with lens implant  . TONSILLECTOMY    . TUBAL LIGATION    . umbillical hernia     Social History   Occupational History  . Not on file  Tobacco Use  . Smoking status: Former Smoker    Packs/day: 0.25    Years: 45.00    Pack years: 11.25    Types: Cigarettes    Start date: 09/30/1963  Last attempt to quit: 08/15/2015    Years since quitting: 1.5  . Smokeless tobacco: Never Used  Substance and Sexual Activity  . Alcohol use: No    Alcohol/week: 0.0 oz  . Drug use: No  . Sexual activity: Not Currently    Birth control/protection: Post-menopausal

## 2017-02-25 ENCOUNTER — Other Ambulatory Visit: Payer: Self-pay

## 2017-02-25 ENCOUNTER — Ambulatory Visit (HOSPITAL_COMMUNITY): Payer: Medicare Other | Admitting: Occupational Therapy

## 2017-02-25 ENCOUNTER — Encounter (HOSPITAL_COMMUNITY): Payer: Self-pay | Admitting: Occupational Therapy

## 2017-02-25 DIAGNOSIS — R29898 Other symptoms and signs involving the musculoskeletal system: Secondary | ICD-10-CM

## 2017-02-25 DIAGNOSIS — M25612 Stiffness of left shoulder, not elsewhere classified: Secondary | ICD-10-CM | POA: Diagnosis not present

## 2017-02-25 DIAGNOSIS — M25512 Pain in left shoulder: Secondary | ICD-10-CM | POA: Diagnosis not present

## 2017-02-25 NOTE — Therapy (Signed)
[place], [place], [address] Phone: [phone]   Fax:  [phone]  Occupational Therapy Treatment  Patient Details  Name: Victoria Reyes MRN: [id number] Date of Birth: 1951/03/06 Referring Provider: Dr. Meredith Pel   Encounter Date: 02/25/2017  OT End of Session - 02/25/17 1734    Visit Number  15    Number of Visits  24    Date for OT Re-Evaluation  03/13/17    Authorization Type  Medicare A & B    Authorization Time Period  Before 20th visit    Authorization - Visit Number  15    Authorization - Number of Visits  20    OT Start Time  2831    OT Stop Time  1730    OT Time Calculation (min)  40 min    Activity Tolerance  Patient tolerated treatment well    Behavior During Therapy  [facility] for tasks assessed/performed       Past Medical History:  Diagnosis Date  . Anemia    as a  young woman  . Diverticulitis 2005   CT   . DJD (degenerative joint disease)   . GERD (gastroesophageal reflux disease)   . Headache(784.0)   . Hiatal hernia   . Hyperlipidemia   . Hypertension   . Hypothyroidism   . Knee pain, right   . Low back pain   . Neuropathy    peroneal nerve  . Obesity   . Pneumonia    as child  . Pre-diabetes   . S/P colonoscopy 2005   Dr. Tamala Julian: sigmoid diverticulosis  . Thyroid disease   . Tobacco abuse     Past Surgical History:  Procedure Laterality Date  . BACK SURGERY    . COLONOSCOPY N/A 12/18/2010   Performed by Danie Binder, MD at [place]  . cyst removal right wrist    . EYE SURGERY Bilateral    cataract surgery with lens implant  . TONSILLECTOMY    . TOTAL SHOULDER ARTHROPLASTY Left 12/21/2016   Performed by Meredith Pel, MD at [place]  . TUBAL LIGATION    . umbillical hernia      There were no vitals filed for this visit.  Subjective Assessment - 02/25/17 1650    Subjective   S: I can reach my hat now.     Currently in Pain?  No/denies          [facility] OT Assessment - 02/25/17 1649      Assessment   Diagnosis  Left TSA      Precautions   Precautions  Shoulder    Type of Shoulder Precautions  ROM and strengthening as tolerated: er to 45 when stretching    Precaution Comments  Progress with ROM first, strengthening second when appropriate               OT Treatments/Exercises (OP) - 02/25/17 1704      Exercises   Exercises  Shoulder      Shoulder Exercises: Supine   Protraction  PROM;5 reps    Horizontal ABduction  PROM;5 reps    External Rotation  PROM;5 reps    Internal Rotation  PROM;5 reps    Flexion  PROM;5 reps    ABduction  PROM;5 reps      Shoulder Exercises: Seated   Protraction  AAROM;12 reps    Horizontal ABduction  AAROM;12 reps    External Rotation  AAROM;12 reps    Internal Rotation  AAROM;12 reps    Flexion  AAROM;12 reps    Abduction  AAROM;12 reps      Shoulder Exercises: Standing   Extension  Theraband;10 reps    Theraband Level (Shoulder Extension)  Level 2 (Red)    Row  Theraband;10 reps    Theraband Level (Shoulder Row)  Level 2 (Red)      Shoulder Exercises: Pulleys   Flexion  1 minute    ABduction  1 minute      Shoulder Exercises: ROM/Strengthening   Wall Wash  1'      Manual Therapy   Manual Therapy  Myofascial release;Joint mobilization    Manual therapy comments  completed separately from therapeutic exercise    Joint Mobilization  Scapular mobilizations complete while seated to increase functional mobility needed for daily tasks.     Myofascial Release  Myofascial release to left upper arm, trapezius, and scapularis regions to decrease pain and fascial restrictions and increase joint range of motion.                OT Short Term Goals - 02/14/17 1803      OT SHORT TERM GOAL #1   Title  Pt will be provided with and educated on HEP.     Time  4    Period  Weeks    Status  On-going      OT SHORT TERM GOAL #2   Title  Pt will decrease LUE pain to  5/10 or less to improve ability to use LUE as assist during dressing tasks.     Time  4    Period  Weeks      OT SHORT TERM GOAL #3   Title  Pt will decrease fascial restrictions in LUE from max to moderate amounts to improve mobility in LUE during daily tasks.     Time  4    Period  Weeks      OT SHORT TERM GOAL #4   Title  Pt will improve P/ROM of LUE to [facility] to increase ability to complete ADL tasks at waist level.     Time  4    Period  Weeks    Status  On-going      OT SHORT TERM GOAL #5   Status  On-going        OT Long Term Goals - 01/14/17 1148      OT LONG TERM GOAL #1   Title  Pt will return to highest level of functioning and independence in B/ADL completion using LUE as non-dominant.     Time  8    Period  Weeks    Status  On-going      OT LONG TERM GOAL #2   Title  Pt will decrease LUE fascial restrictions from mod to minimal amounts or less to improve mobility required for functional reaching tasks.     Time  8    Period  Weeks    Status  On-going      OT LONG TERM GOAL #3   Title  Pt will decrease LUE pain to 3/10 or less to improve use of LUE as an assist during B/ADL completion.     Time  8    Period  Weeks    Status  On-going      OT LONG TERM GOAL #4   Title  Pt will increase LUE A/ROM to [facility] to improve ability to reach up  and perform grooming tasks.     Time  8    Period  Weeks    Status  On-going      OT LONG TERM GOAL #5   Title  Pt will improve LUE strength to 4/5 to improve ability to perform work tasks using LUE as non-dominant.    Time  8    Period  Weeks    Status  On-going            Plan - 02/25/17 1735    Clinical Impression Statement  A: Continued with scapular mobilization, added theraband extension and row, added pulleys today. Pt reporting improvements in use of LUE at home during ADLs. Verbal cuing intermittently for form and technique.     Plan  P: Add A/ROM in supine if pt able to complete with good form    OT Home  Exercise Plan  10/3: table slides; 10/5: scapular A/ROM    Consulted and Agree with Plan of Care  Patient       Patient will benefit from skilled therapeutic intervention in order to improve the following deficits and impairments:  Decreased strength, Decreased activity tolerance, Decreased mobility, Decreased range of motion, Pain, Impaired UE functional use, Increased fascial restricitons  Visit Diagnosis: Acute pain of left shoulder  Other symptoms and signs involving the musculoskeletal system  Stiffness of left shoulder, not elsewhere classified    Problem List Patient Active Problem List   Diagnosis Date Noted  . Neck pain on left side 01/20/2016  . Shoulder pain, left 11/26/2015  . Cutaneous wart 09/30/2014  . HTN (hypertension) 01/01/2013  . Hyperlipidemia with target LDL less than 100 05/04/2012  . IGT (impaired glucose tolerance) 03/14/2011  . GERD (gastroesophageal reflux disease) 03/14/2011  . BACK PAIN, LUMBAR, CHRONIC 06/25/2009  . Obesity 02/17/2007  . Hypothyroidism 06/14/2006  . DIVERTICULOSIS, COLON 03/01/2006  . HIATAL HERNIA WITH REFLUX, HX OF 03/01/2006    Guadelupe Sabin, OTR/L  [phone] 02/25/2017, 5:36 PM  [place] [address] [place], [place], [address] Phone: [phone]   Fax:  [phone]  Name: Victoria Reyes MRN: [id number] Date of Birth: Apr 01, 1951

## 2017-02-28 ENCOUNTER — Encounter (HOSPITAL_COMMUNITY): Payer: Self-pay

## 2017-02-28 ENCOUNTER — Ambulatory Visit (HOSPITAL_COMMUNITY): Payer: Medicare Other

## 2017-02-28 DIAGNOSIS — M25512 Pain in left shoulder: Secondary | ICD-10-CM | POA: Diagnosis not present

## 2017-02-28 DIAGNOSIS — R29898 Other symptoms and signs involving the musculoskeletal system: Secondary | ICD-10-CM | POA: Diagnosis not present

## 2017-02-28 DIAGNOSIS — M25612 Stiffness of left shoulder, not elsewhere classified: Secondary | ICD-10-CM

## 2017-02-28 NOTE — Therapy (Signed)
[place], [place], [address] Phone: [phone]   Fax:  [phone]  Occupational Therapy Treatment  Patient Details  Name: Victoria Reyes MRN: [id number] Date of Birth: Mar 08, 1951 Referring Provider: Dr. Meredith Pel   Encounter Date: 02/28/2017  OT End of Session - 02/28/17 1737    Visit Number  16    Number of Visits  24    Date for OT Re-Evaluation  03/13/17    Authorization Type  Medicare A & B    Authorization Time Period  Before 20th visit    Authorization - Visit Number  16    Authorization - Number of Visits  20    OT Start Time  8676    OT Stop Time  1731    OT Time Calculation (min)  41 min    Activity Tolerance  Patient tolerated treatment well    Behavior During Therapy  [facility] for tasks assessed/performed       Past Medical History:  Diagnosis Date  . Anemia    as a  young woman  . Diverticulitis 2005   CT   . DJD (degenerative joint disease)   . GERD (gastroesophageal reflux disease)   . Headache(784.0)   . Hiatal hernia   . Hyperlipidemia   . Hypertension   . Hypothyroidism   . Knee pain, right   . Low back pain   . Neuropathy    peroneal nerve  . Obesity   . Pneumonia    as child  . Pre-diabetes   . S/P colonoscopy 2005   Dr. Tamala Julian: sigmoid diverticulosis  . Thyroid disease   . Tobacco abuse     Past Surgical History:  Procedure Laterality Date  . BACK SURGERY    . COLONOSCOPY N/A 12/18/2010   Performed by Danie Binder, MD at [place]  . cyst removal right wrist    . EYE SURGERY Bilateral    cataract surgery with lens implant  . TONSILLECTOMY    . TOTAL SHOULDER ARTHROPLASTY Left 12/21/2016   Performed by Meredith Pel, MD at [place]  . TUBAL LIGATION    . umbillical hernia      There were no vitals filed for this visit.  Subjective Assessment - 02/28/17 1719    Subjective   S: I need my arm to be 100%.    Currently in Pain?  Yes    Pain Score   2     Pain Location  Shoulder    Pain Orientation  Left    Pain Descriptors / Indicators  Aching;Sore    Pain Radiating Towards  N/A    Pain Onset  More than a month ago    Pain Frequency  Intermittent    Aggravating Factors   movement, weather    Pain Relieving Factors  rest, heat    Effect of Pain on Daily Activities  mod effect    Multiple Pain Sites  No         [facility] OT Assessment - 02/28/17 1721      Assessment   Diagnosis  Left TSA      Precautions   Precautions  Shoulder    Type of Shoulder Precautions  ROM and strengthening as tolerated: er to 45 when stretching    Precaution Comments  Progress with ROM first, strengthening second when appropriate               OT Treatments/Exercises (  OP) - 02/28/17 1721      Exercises   Exercises  Shoulder      Shoulder Exercises: Supine   Protraction  PROM;5 reps;AROM;10 reps    Horizontal ABduction  PROM;5 reps;AROM;10 reps    External Rotation  PROM;5 reps;AROM;10 reps    Internal Rotation  PROM;5 reps;AROM;10 reps    Flexion  PROM;5 reps;AROM;10 reps    ABduction  PROM;5 reps;AROM;10 reps      Shoulder Exercises: Pulleys   Flexion  1 minute    ABduction  1 minute      Shoulder Exercises: ROM/Strengthening   Proximal Shoulder Strengthening, Supine  10X no rest breaks    Other ROM/Strengthening Exercises  PVC pipe slide; 15X flexion 15X abduction      Manual Therapy   Manual Therapy  Myofascial release    Manual therapy comments  completed separately from therapeutic exercise    Myofascial Release  Myofascial release to left upper arm, trapezius, and scapularis regions to decrease pain and fascial restrictions and increase joint range of motion.                OT Short Term Goals - 02/14/17 1803      OT SHORT TERM GOAL #1   Title  Pt will be provided with and educated on HEP.     Time  4    Period  Weeks    Status  On-going      OT SHORT TERM GOAL #2   Title  Pt will decrease LUE pain to  5/10 or less to improve ability to use LUE as assist during dressing tasks.     Time  4    Period  Weeks      OT SHORT TERM GOAL #3   Title  Pt will decrease fascial restrictions in LUE from max to moderate amounts to improve mobility in LUE during daily tasks.     Time  4    Period  Weeks      OT SHORT TERM GOAL #4   Title  Pt will improve P/ROM of LUE to [facility] to increase ability to complete ADL tasks at waist level.     Time  4    Period  Weeks    Status  On-going      OT SHORT TERM GOAL #5   Status  On-going        OT Long Term Goals - 01/14/17 1148      OT LONG TERM GOAL #1   Title  Pt will return to highest level of functioning and independence in B/ADL completion using LUE as non-dominant.     Time  8    Period  Weeks    Status  On-going      OT LONG TERM GOAL #2   Title  Pt will decrease LUE fascial restrictions from mod to minimal amounts or less to improve mobility required for functional reaching tasks.     Time  8    Period  Weeks    Status  On-going      OT LONG TERM GOAL #3   Title  Pt will decrease LUE pain to 3/10 or less to improve use of LUE as an assist during B/ADL completion.     Time  8    Period  Weeks    Status  On-going      OT LONG TERM GOAL #4   Title  Pt will increase LUE A/ROM to [facility] to improve  ability to reach up and perform grooming tasks.     Time  8    Period  Weeks    Status  On-going      OT LONG TERM GOAL #5   Title  Pt will improve LUE strength to 4/5 to improve ability to perform work tasks using LUE as non-dominant.    Time  8    Period  Weeks    Status  On-going            Plan - 02/28/17 1737    Clinical Impression Statement  A: Pt was able to complete A/ROM supine although unable to achieve full ROM due to joint mobility limitation. VC for form and technique.    Plan  P: Add overhead lacing or another type of functional reaching task. Add UBE bike.    Consulted and Agree with Plan of Care  Patient       Patient  will benefit from skilled therapeutic intervention in order to improve the following deficits and impairments:  Decreased strength, Decreased activity tolerance, Decreased mobility, Decreased range of motion, Pain, Impaired UE functional use, Increased fascial restricitons  Visit Diagnosis: Acute pain of left shoulder  Other symptoms and signs involving the musculoskeletal system  Stiffness of left shoulder, not elsewhere classified    Problem List Patient Active Problem List   Diagnosis Date Noted  . Neck pain on left side 01/20/2016  . Shoulder pain, left 11/26/2015  . Cutaneous wart 09/30/2014  . HTN (hypertension) 01/01/2013  . Hyperlipidemia with target LDL less than 100 05/04/2012  . IGT (impaired glucose tolerance) 03/14/2011  . GERD (gastroesophageal reflux disease) 03/14/2011  . BACK PAIN, LUMBAR, CHRONIC 06/25/2009  . Obesity 02/17/2007  . Hypothyroidism 06/14/2006  . DIVERTICULOSIS, COLON 03/01/2006  . HIATAL HERNIA WITH REFLUX, HX OF 03/01/2006   Ailene Ravel, OTR/L,CBIS  [phone]  02/28/2017, 5:39 PM  [place] [address] [place], [place], [address] Phone: [phone]   Fax:  [phone]  Name: AZRIELLA MATTIA MRN: [id number] Date of Birth: 07/25/1950

## 2017-03-02 ENCOUNTER — Ambulatory Visit (HOSPITAL_COMMUNITY): Payer: Medicare Other | Admitting: Occupational Therapy

## 2017-03-02 ENCOUNTER — Other Ambulatory Visit: Payer: Self-pay

## 2017-03-02 ENCOUNTER — Encounter (HOSPITAL_COMMUNITY): Payer: Self-pay | Admitting: Occupational Therapy

## 2017-03-02 DIAGNOSIS — M25612 Stiffness of left shoulder, not elsewhere classified: Secondary | ICD-10-CM

## 2017-03-02 DIAGNOSIS — R29898 Other symptoms and signs involving the musculoskeletal system: Secondary | ICD-10-CM

## 2017-03-02 DIAGNOSIS — M25512 Pain in left shoulder: Secondary | ICD-10-CM

## 2017-03-02 NOTE — Therapy (Signed)
[place], [place], [address] Phone: [phone]   Fax:  [phone]  Occupational Therapy Treatment  Patient Details  Name: Victoria Reyes MRN: [id number] Date of Birth: December 02, 1950 Referring Provider: Dr. Meredith Pel   Encounter Date: 03/02/2017  OT End of Session - 03/02/17 1034    Visit Number  17    Number of Visits  24    Date for OT Re-Evaluation  03/13/17    Authorization Type  Medicare A & B    Authorization Time Period  Before 20th visit    Authorization - Visit Number  17    Authorization - Number of Visits  20    OT Start Time  [phone]    OT Stop Time  1030    OT Time Calculation (min)  47 min    Activity Tolerance  Patient tolerated treatment well    Behavior During Therapy  [facility] for tasks assessed/performed       Past Medical History:  Diagnosis Date  . Anemia    as a  young woman  . Diverticulitis 2005   CT   . DJD (degenerative joint disease)   . GERD (gastroesophageal reflux disease)   . Headache(784.0)   . Hiatal hernia   . Hyperlipidemia   . Hypertension   . Hypothyroidism   . Knee pain, right   . Low back pain   . Neuropathy    peroneal nerve  . Obesity   . Pneumonia    as child  . Pre-diabetes   . S/P colonoscopy 2005   Dr. Tamala Julian: sigmoid diverticulosis  . Thyroid disease   . Tobacco abuse     Past Surgical History:  Procedure Laterality Date  . BACK SURGERY    . COLONOSCOPY  12/18/2010   Procedure: COLONOSCOPY;  Surgeon: Dorothyann Peng, MD;  [facility];  Service: Endoscopy;  Laterality: N/A;  10:15am  . cyst removal right wrist    . EYE SURGERY Bilateral    cataract surgery with lens implant  . TONSILLECTOMY    . TOTAL SHOULDER ARTHROPLASTY Left 12/21/2016   Procedure: TOTAL SHOULDER ARTHROPLASTY;  Surgeon: Meredith Pel, MD;  Location: [facility];  Service: Orthopedics;  Laterality: Left;  . TUBAL LIGATION    . umbillical hernia      There  were no vitals filed for this visit.  Subjective Assessment - 03/02/17 0941    Subjective   S: I have to use my arm to help with the cooking.     Currently in Pain?  Yes    Pain Score  2     Pain Location  Shoulder    Pain Orientation  Left    Pain Descriptors / Indicators  Aching;Sore    Pain Type  Acute pain    Pain Radiating Towards  n/a    Pain Onset  More than a month ago    Pain Frequency  Intermittent    Aggravating Factors   movement, weather    Pain Relieving Factors  rest, heat    Effect of Pain on Daily Activities  mod effect on ADLs    Multiple Pain Sites  No         [facility] OT Assessment - 03/02/17 0940      Assessment   Diagnosis  Left TSA      Precautions   Precautions  Shoulder    Type of Shoulder Precautions  ROM and strengthening  as tolerated: er to 70 when stretching    Precaution Comments  Progress with ROM first, strengthening second when appropriate               OT Treatments/Exercises (OP) - 03/02/17 0941      Exercises   Exercises  Shoulder      Shoulder Exercises: Supine   Protraction  PROM;5 reps;AROM;10 reps    Horizontal ABduction  PROM;5 reps;AROM;10 reps    External Rotation  PROM;5 reps;AROM;10 reps    Internal Rotation  PROM;5 reps;AROM;10 reps    Flexion  PROM;5 reps;AROM;10 reps    ABduction  PROM;5 reps;AROM;10 reps      Shoulder Exercises: Seated   Protraction  AROM;10 reps    Horizontal ABduction  AAROM;12 reps    External Rotation  AROM;10 reps    Internal Rotation  AROM;10 reps    Flexion  AAROM;12 reps    Abduction  AAROM;12 reps      Shoulder Exercises: Standing   Extension  Theraband;10 reps    Theraband Level (Shoulder Extension)  Level 2 (Red)    Row  Theraband;10 reps    Theraband Level (Shoulder Row)  Level 2 (Red)      Shoulder Exercises: Pulleys   Flexion  1 minute    ABduction  1 minute      Shoulder Exercises: ROM/Strengthening   UBE (Upper Arm Bike)  Level 1 1' forward 1' reverse    Proximal  Shoulder Strengthening, Supine  10X no rest breaks    Other ROM/Strengthening Exercises  PVC pipe slide; 15X flexion 15X abduction      Functional Reaching Activities   High Level  Pt stood at cabinets and used LUE to place 10 cones on bottom shelf with min difficulty. Pt then transferred cones from bottom to middle shelf with mod/max difficulty reaching into flexion. Pt then removed with LUE.       Manual Therapy   Manual Therapy  Myofascial release    Manual therapy comments  completed separately from therapeutic exercise    Myofascial Release  Myofascial release to left upper arm, trapezius, and scapularis regions to decrease pain and fascial restrictions and increase joint range of motion.                OT Short Term Goals - 02/14/17 1803      OT SHORT TERM GOAL #1   Title  Pt will be provided with and educated on HEP.     Time  4    Period  Weeks    Status  On-going      OT SHORT TERM GOAL #2   Title  Pt will decrease LUE pain to 5/10 or less to improve ability to use LUE as assist during dressing tasks.     Time  4    Period  Weeks      OT SHORT TERM GOAL #3   Title  Pt will decrease fascial restrictions in LUE from max to moderate amounts to improve mobility in LUE during daily tasks.     Time  4    Period  Weeks      OT SHORT TERM GOAL #4   Title  Pt will improve P/ROM of LUE to [facility] to increase ability to complete ADL tasks at waist level.     Time  4    Period  Weeks    Status  On-going      OT SHORT TERM GOAL #5  Status  On-going        OT Long Term Goals - 01/14/17 1148      OT LONG TERM GOAL #1   Title  Pt will return to highest level of functioning and independence in B/ADL completion using LUE as non-dominant.     Time  8    Period  Weeks    Status  On-going      OT LONG TERM GOAL #2   Title  Pt will decrease LUE fascial restrictions from mod to minimal amounts or less to improve mobility required for functional reaching tasks.     Time  8     Period  Weeks    Status  On-going      OT LONG TERM GOAL #3   Title  Pt will decrease LUE pain to 3/10 or less to improve use of LUE as an assist during B/ADL completion.     Time  8    Period  Weeks    Status  On-going      OT LONG TERM GOAL #4   Title  Pt will increase LUE A/ROM to [facility] to improve ability to reach up and perform grooming tasks.     Time  8    Period  Weeks    Status  On-going      OT LONG TERM GOAL #5   Title  Pt will improve LUE strength to 4/5 to improve ability to perform work tasks using LUE as non-dominant.    Time  8    Period  Weeks    Status  On-going            Plan - 03/02/17 1035    Clinical Impression Statement  A: Added functional reaching with cones, mod/max difficulty at end range flexion. Continued with A/ROM supine, added A/ROM protraction and er/IR in sitting. Added UBE with minimal difficulty. Pt with notable improvement in form with abduction pulleys. Verbal cuing for form and technique during session.     Plan  P: Add overhead lacing, complete pinch tree.     OT Home Exercise Plan  10/3: table slides; 10/5: scapular A/ROM    Consulted and Agree with Plan of Care  Patient       Patient will benefit from skilled therapeutic intervention in order to improve the following deficits and impairments:  Decreased strength, Decreased activity tolerance, Decreased mobility, Decreased range of motion, Pain, Impaired UE functional use, Increased fascial restricitons  Visit Diagnosis: Acute pain of left shoulder  Other symptoms and signs involving the musculoskeletal system  Stiffness of left shoulder, not elsewhere classified    Problem List Patient Active Problem List   Diagnosis Date Noted  . Neck pain on left side 01/20/2016  . Shoulder pain, left 11/26/2015  . Cutaneous wart 09/30/2014  . HTN (hypertension) 01/01/2013  . Hyperlipidemia with target LDL less than 100 05/04/2012  . IGT (impaired glucose tolerance) 03/14/2011  .  GERD (gastroesophageal reflux disease) 03/14/2011  . BACK PAIN, LUMBAR, CHRONIC 06/25/2009  . Obesity 02/17/2007  . Hypothyroidism 06/14/2006  . DIVERTICULOSIS, COLON 03/01/2006  . HIATAL HERNIA WITH REFLUX, HX OF 03/01/2006   Guadelupe Sabin, OTR/L  [phone] 03/02/2017, 10:37 AM  [facility] [address] [place], [place], [address] Phone: [phone]   Fax:  [phone]  Name: Victoria Reyes MRN: [id number] Date of Birth: March 01, 1951

## 2017-03-07 ENCOUNTER — Encounter (HOSPITAL_COMMUNITY): Payer: Self-pay | Admitting: Specialist

## 2017-03-07 ENCOUNTER — Ambulatory Visit (HOSPITAL_COMMUNITY): Payer: Medicare Other | Admitting: Specialist

## 2017-03-07 DIAGNOSIS — M25512 Pain in left shoulder: Secondary | ICD-10-CM

## 2017-03-07 DIAGNOSIS — R29898 Other symptoms and signs involving the musculoskeletal system: Secondary | ICD-10-CM

## 2017-03-07 DIAGNOSIS — M25612 Stiffness of left shoulder, not elsewhere classified: Secondary | ICD-10-CM | POA: Diagnosis not present

## 2017-03-07 NOTE — Therapy (Signed)
[place], [place], [address] Phone: [phone]   Fax:  [phone]  Occupational Therapy Treatment  Patient Details  Name: Victoria Reyes MRN: [id number] Date of Birth: 07-Apr-1951 Referring Provider: Dr. Meredith Pel   Encounter Date: 03/07/2017  OT End of Session - 03/07/17 1748    Visit Number  18    Number of Visits  24    Date for OT Re-Evaluation  03/13/17    Authorization Type  Medicare A & B    Authorization Time Period  Before 20th visit    Authorization - Visit Number  18    Authorization - Number of Visits  20    OT Start Time  1350    OT Stop Time  1430    OT Time Calculation (min)  40 min    Activity Tolerance  Patient tolerated treatment well    Behavior During Therapy  [facility] for tasks assessed/performed       Past Medical History:  Diagnosis Date  . Anemia    as a  young woman  . Diverticulitis 2005   CT   . DJD (degenerative joint disease)   . GERD (gastroesophageal reflux disease)   . Headache(784.0)   . Hiatal hernia   . Hyperlipidemia   . Hypertension   . Hypothyroidism   . Knee pain, right   . Low back pain   . Neuropathy    peroneal nerve  . Obesity   . Pneumonia    as child  . Pre-diabetes   . S/P colonoscopy 2005   Dr. Tamala Julian: sigmoid diverticulosis  . Thyroid disease   . Tobacco abuse     Past Surgical History:  Procedure Laterality Date  . BACK SURGERY    . COLONOSCOPY  12/18/2010   Procedure: COLONOSCOPY;  Surgeon: Dorothyann Peng, MD;  [facility];  Service: Endoscopy;  Laterality: N/A;  10:15am  . cyst removal right wrist    . EYE SURGERY Bilateral    cataract surgery with lens implant  . TONSILLECTOMY    . TOTAL SHOULDER ARTHROPLASTY Left 12/21/2016   Procedure: TOTAL SHOULDER ARTHROPLASTY;  Surgeon: Meredith Pel, MD;  Location: [facility];  Service: Orthopedics;  Laterality: Left;  . TUBAL LIGATION    . umbillical hernia      There  were no vitals filed for this visit.  Subjective Assessment - 03/07/17 1747    Subjective   S:  I know its getting better, it just hurts.     Currently in Pain?  Yes    Pain Score  3     Pain Location  Shoulder    Pain Orientation  Left    Pain Descriptors / Indicators  Aching         [facility] OT Assessment - 03/07/17 0001      Assessment   Diagnosis  Left TSA      Precautions   Precautions  Shoulder    Type of Shoulder Precautions  ROM and strengthening as tolerated: er to 45 when stretching    Precaution Comments  Progress with ROM first, strengthening second when appropriate               OT Treatments/Exercises (OP) - 03/07/17 0001      Exercises   Exercises  Shoulder      Shoulder Exercises: Supine   Protraction  PROM;5 reps;AROM;12 reps    Horizontal ABduction  PROM;5 reps;AROM;12 reps  External Rotation  PROM;5 reps;AROM;12 reps    Internal Rotation  5 reps;PROM;AROM;12 reps    Flexion  PROM;5 reps;AROM;12 reps    ABduction  PROM;5 reps;AROM;12 reps      Shoulder Exercises: Seated   Protraction  AROM;12 reps    Horizontal ABduction  AROM;12 reps therapist assist    External Rotation  AROM;12 reps    Internal Rotation  AROM;12 reps    Flexion  AROM;12 reps    Abduction  AROM;12 reps      Shoulder Exercises: ROM/Strengthening   Proximal Shoulder Strengthening, Supine  10X no rest breaks    Proximal Shoulder Strengthening, Seated  10 X with assist from therapist     Other ROM/Strengthening Exercises  graduated pinch tree with cuing to depress shoulder blade and sit in upright alignment       Manual Therapy   Manual Therapy  Myofascial release    Manual therapy comments  completed separately from therapeutic exercise    Myofascial Release  Myofascial release to left upper arm, trapezius, and scapularis regions to decrease pain and fascial restrictions and increase joint range of motion.                OT Short Term Goals - 02/14/17 1803       OT SHORT TERM GOAL #1   Title  Pt will be provided with and educated on HEP.     Time  4    Period  Weeks    Status  On-going      OT SHORT TERM GOAL #2   Title  Pt will decrease LUE pain to 5/10 or less to improve ability to use LUE as assist during dressing tasks.     Time  4    Period  Weeks      OT SHORT TERM GOAL #3   Title  Pt will decrease fascial restrictions in LUE from max to moderate amounts to improve mobility in LUE during daily tasks.     Time  4    Period  Weeks      OT SHORT TERM GOAL #4   Title  Pt will improve P/ROM of LUE to [facility] to increase ability to complete ADL tasks at waist level.     Time  4    Period  Weeks    Status  On-going      OT SHORT TERM GOAL #5   Status  On-going        OT Long Term Goals - 01/14/17 1148      OT LONG TERM GOAL #1   Title  Pt will return to highest level of functioning and independence in B/ADL completion using LUE as non-dominant.     Time  8    Period  Weeks    Status  On-going      OT LONG TERM GOAL #2   Title  Pt will decrease LUE fascial restrictions from mod to minimal amounts or less to improve mobility required for functional reaching tasks.     Time  8    Period  Weeks    Status  On-going      OT LONG TERM GOAL #3   Title  Pt will decrease LUE pain to 3/10 or less to improve use of LUE as an assist during B/ADL completion.     Time  8    Period  Weeks    Status  On-going      OT LONG TERM GOAL #  4   Title  Pt will increase LUE A/ROM to [facility] to improve ability to reach up and perform grooming tasks.     Time  8    Period  Weeks    Status  On-going      OT LONG TERM GOAL #5   Title  Pt will improve LUE strength to 4/5 to improve ability to perform work tasks using LUE as non-dominant.    Time  8    Period  Weeks    Status  On-going            Plan - 03/07/17 1748    Clinical Impression Statement  A: Patient requires tactile cues and assist to decrease scapular elevation with reaching and to  maintain positioning for horizontal abduction.  With assistance, patient able to complete with increased pain.     Plan  P:  Recertification, complete pinch tree with less compensatory movements.        Patient will benefit from skilled therapeutic intervention in order to improve the following deficits and impairments:  Decreased strength, Decreased activity tolerance, Decreased mobility, Decreased range of motion, Pain, Impaired UE functional use, Increased fascial restricitons  Visit Diagnosis: Acute pain of left shoulder  Other symptoms and signs involving the musculoskeletal system  Stiffness of left shoulder, not elsewhere classified    Problem List Patient Active Problem List   Diagnosis Date Noted  . Neck pain on left side 01/20/2016  . Shoulder pain, left 11/26/2015  . Cutaneous wart 09/30/2014  . HTN (hypertension) 01/01/2013  . Hyperlipidemia with target LDL less than 100 05/04/2012  . IGT (impaired glucose tolerance) 03/14/2011  . GERD (gastroesophageal reflux disease) 03/14/2011  . BACK PAIN, LUMBAR, CHRONIC 06/25/2009  . Obesity 02/17/2007  . Hypothyroidism 06/14/2006  . DIVERTICULOSIS, COLON 03/01/2006  . HIATAL HERNIA WITH REFLUX, HX OF 03/01/2006    Vangie Bicker, [place], OTR/L [phone]  03/07/2017, 5:53 PM  [place], [place], [address] Phone: [phone]   Fax:  [phone]  Name: Victoria Reyes MRN: [id number] Date of Birth: February 14, 1951

## 2017-03-09 ENCOUNTER — Ambulatory Visit (HOSPITAL_COMMUNITY): Payer: Medicare Other | Admitting: Occupational Therapy

## 2017-03-09 ENCOUNTER — Encounter (HOSPITAL_COMMUNITY): Payer: Self-pay | Admitting: Occupational Therapy

## 2017-03-09 ENCOUNTER — Other Ambulatory Visit: Payer: Self-pay

## 2017-03-09 ENCOUNTER — Other Ambulatory Visit: Payer: Self-pay | Admitting: Family Medicine

## 2017-03-09 DIAGNOSIS — Z1231 Encounter for screening mammogram for malignant neoplasm of breast: Secondary | ICD-10-CM

## 2017-03-09 DIAGNOSIS — M25612 Stiffness of left shoulder, not elsewhere classified: Secondary | ICD-10-CM | POA: Diagnosis not present

## 2017-03-09 DIAGNOSIS — R29898 Other symptoms and signs involving the musculoskeletal system: Secondary | ICD-10-CM

## 2017-03-09 DIAGNOSIS — M25512 Pain in left shoulder: Secondary | ICD-10-CM | POA: Diagnosis not present

## 2017-03-09 NOTE — Therapy (Addendum)
[place], [place], [address] Phone: [phone]   Fax:  [phone]  Occupational Therapy Reassessment & Treatment  Patient Details  Name: Victoria Reyes MRN: [id number] Date of Birth: Jun 01, 1950 Referring Provider: Dr. Meredith Pel   Encounter Date: 03/09/2017  OT End of Session - 03/09/17 1435    Visit Number  19    Number of Visits  31   Date for OT Re-Evaluation  04/08/17    Authorization Type  Medicare A & B    Authorization Time Period  Before 29th visit    Authorization - Visit Number  19    Authorization - Number of Visits  29    OT Start Time  3419    OT Stop Time  1431    OT Time Calculation (min)  43 min    Activity Tolerance  Patient tolerated treatment well    Behavior During Therapy  [facility] for tasks assessed/performed       Past Medical History:  Diagnosis Date  . Anemia    as a  young woman  . Diverticulitis 2005   CT   . DJD (degenerative joint disease)   . GERD (gastroesophageal reflux disease)   . Headache(784.0)   . Hiatal hernia   . Hyperlipidemia   . Hypertension   . Hypothyroidism   . Knee pain, right   . Low back pain   . Neuropathy    peroneal nerve  . Obesity   . Pneumonia    as child  . Pre-diabetes   . S/P colonoscopy 2005   Dr. Tamala Julian: sigmoid diverticulosis  . Thyroid disease   . Tobacco abuse     Past Surgical History:  Procedure Laterality Date  . BACK SURGERY    . COLONOSCOPY  12/18/2010   Procedure: COLONOSCOPY;  Surgeon: Dorothyann Peng, MD;  [facility];  Service: Endoscopy;  Laterality: N/A;  10:15am  . cyst removal right wrist    . EYE SURGERY Bilateral    cataract surgery with lens implant  . TONSILLECTOMY    . TOTAL SHOULDER ARTHROPLASTY Left 12/21/2016   Procedure: TOTAL SHOULDER ARTHROPLASTY;  Surgeon: Meredith Pel, MD;  Location: [place];  Service: Orthopedics;  Laterality: Left;  . TUBAL LIGATION    . umbillical hernia       There were no vitals filed for this visit.  Subjective Assessment - 03/09/17 1351    Subjective   S: I really worked on it at home.     Currently in Pain?  Yes    Pain Score  3     Pain Location  Shoulder    Pain Orientation  Left    Pain Descriptors / Indicators  Aching;Sore    Pain Type  Acute pain    Pain Radiating Towards  n/a    Pain Onset  More than a month ago    Pain Frequency  Intermittent    Aggravating Factors   movement, weather    Pain Relieving Factors  rest, heat    Effect of Pain on Daily Activities  mod effect on ADLs         [facility] OT Assessment - 03/09/17 1350      Assessment   Diagnosis  Left TSA      Precautions   Precautions  Shoulder    Type of Shoulder Precautions  ROM and strengthening as tolerated: er to 45 when stretching    Precaution  Comments  Progress with ROM first, strengthening second when appropriate      AROM   Overall AROM Comments  Assessed seated, er/IR adducted    AROM Assessment Site  Shoulder    Right/Left Shoulder  Left    Left Shoulder Flexion  120 Degrees 96 previous    Left Shoulder ABduction  99 Degrees 75 previous    Left Shoulder Internal Rotation  90 Degrees same as previous    Left Shoulder External Rotation  53 Degrees 45 previous      PROM   Overall PROM Comments  Assessed supine, er/IR adducted    PROM Assessment Site  Shoulder    Right/Left Shoulder  Left    Left Shoulder Flexion  135 Degrees 130 previous    Left Shoulder ABduction  130 Degrees 110 previous    Left Shoulder Internal Rotation  90 Degrees same as previous    Left Shoulder External Rotation  60 Degrees 58 previous      Strength   Overall Strength Comments  Assessed seated, er/IR adducted    Strength Assessment Site  Shoulder    Right/Left Shoulder  Left    Left Shoulder Flexion  3/5 3-/5 previous    Left Shoulder ABduction  3-/5 3-/5 previous    Left Shoulder Internal Rotation  4-/5 3-/5 previous    Left Shoulder External Rotation  3+/5  3-/5 previous               OT Treatments/Exercises (OP) - 03/09/17 1355      Exercises   Exercises  Shoulder      Shoulder Exercises: Supine   Protraction  PROM;5 reps;AROM;12 reps    Horizontal ABduction  PROM;5 reps;AROM;12 reps    External Rotation  PROM;5 reps;AROM;12 reps    Internal Rotation  5 reps;PROM;AROM;12 reps    Flexion  PROM;5 reps;AROM;12 reps    ABduction  PROM;5 reps;AROM;12 reps      Shoulder Exercises: Seated   Protraction  AROM;12 reps    Horizontal ABduction  AROM;12 reps    External Rotation  AROM;12 reps    Internal Rotation  AROM;12 reps    Flexion  AROM;12 reps      Shoulder Exercises: Standing   Internal Rotation  Theraband;10 reps    Theraband Level (Shoulder Internal Rotation)  Level 2 (Red)    Extension  Theraband;10 reps    Theraband Level (Shoulder Extension)  Level 2 (Red)    Row  Theraband;10 reps    Theraband Level (Shoulder Row)  Level 2 (Red)      Shoulder Exercises: ROM/Strengthening   UBE (Upper Arm Bike)  Level 1 2' forward 2' reverse      Manual Therapy   Manual Therapy  Myofascial release    Manual therapy comments  completed separately from therapeutic exercise    Myofascial Release  Myofascial release to left upper arm, trapezius, and scapularis regions to decrease pain and fascial restrictions and increase joint range of motion.                OT Short Term Goals - 03/09/17 1437      OT SHORT TERM GOAL #1   Title  Pt will be provided with and educated on HEP.     Time  4    Period  Weeks    Status  On-going      OT SHORT TERM GOAL #2   Title  Pt will decrease LUE pain to 5/10 or less to  improve ability to use LUE as assist during dressing tasks.     Time  4    Period  Weeks      OT SHORT TERM GOAL #3   Title  Pt will decrease fascial restrictions in LUE from max to moderate amounts to improve mobility in LUE during daily tasks.     Time  4    Period  Weeks      OT SHORT TERM GOAL #4   Title  Pt  will improve P/ROM of LUE to [facility] to increase ability to complete ADL tasks at waist level.     Time  4    Period  Weeks    Status  Partially Met                 OT Long Term Goals - 01/14/17 1148      OT LONG TERM GOAL #1   Title  Pt will return to highest level of functioning and independence in B/ADL completion using LUE as non-dominant.     Time  8    Period  Weeks    Status  On-going      OT LONG TERM GOAL #2   Title  Pt will decrease LUE fascial restrictions from mod to minimal amounts or less to improve mobility required for functional reaching tasks.     Time  8    Period  Weeks    Status  On-going      OT LONG TERM GOAL #3   Title  Pt will decrease LUE pain to 3/10 or less to improve use of LUE as an assist during B/ADL completion.     Time  8    Period  Weeks    Status  On-going      OT LONG TERM GOAL #4   Title  Pt will increase LUE A/ROM to [facility] to improve ability to reach up and perform grooming tasks.     Time  8    Period  Weeks    Status  On-going      OT LONG TERM GOAL #5   Title  Pt will improve LUE strength to 4/5 to improve ability to perform work tasks using LUE as non-dominant.    Time  8    Period  Weeks    Status  On-going            Plan - 03/09/17 1437    Clinical Impression Statement  A: Reassessment and recertification completed this session, pt has met 2/4 STGs and partially met an additional STG. Pt has made some improvements in ROM and strength, is now able to reach her hair with greater ease during grooming tasks. Pt will benefit from continued skilled OT services for 4 additional weeks to focus on end range of motion and strength of LUE required for use as non-dominant during B/ADL completion.     Rehab Potential  Good    OT Frequency  3x / week    OT Duration  4 weeks    OT Treatment/Interventions  Self-care/ADL training;Therapeutic exercise;Patient/family education;Manual Therapy;Ultrasound;Therapeutic  activities;Cryotherapy;Electrical Stimulation;Moist Heat;Passive range of motion    Plan  P: Continue OT services for 4 additional weeks working on strength needed for functional task completion. Next session: Continue with A/ROM and update HEP if pt able to complete with good form.     OT Home Exercise Plan  10/3: table slides; 10/5: scapular A/ROM    Consulted and Agree with Plan of  Care  Patient       Patient will benefit from skilled therapeutic intervention in order to improve the following deficits and impairments:  Decreased strength, Decreased activity tolerance, Decreased mobility, Decreased range of motion, Pain, Impaired UE functional use, Increased fascial restricitons  Visit Diagnosis: Acute pain of left shoulder - Plan: Ot plan of care cert/re-cert  Other symptoms and signs involving the musculoskeletal system - Plan: Ot plan of care cert/re-cert  Stiffness of left shoulder, not elsewhere classified - Plan: Ot plan of care cert/re-cert  G-Codes - 16/55/37 1703    Functional Assessment Tool Used (Outpatient only)  FOTO Score: 53/100 (47% impairment)    Functional Limitation  Carrying, moving and handling objects    Carrying, Moving and Handling Objects Current Status (S8270)  At least 40 percent but less than 60 percent impaired, limited or restricted    Carrying, Moving and Handling Objects Goal Status (B8675)  At least 20 percent but less than 40 percent impaired, limited or restricted       Problem List Patient Active Problem List   Diagnosis Date Noted  . Neck pain on left side 01/20/2016  . Shoulder pain, left 11/26/2015  . Cutaneous wart 09/30/2014  . HTN (hypertension) 01/01/2013  . Hyperlipidemia with target LDL less than 100 05/04/2012  . IGT (impaired glucose tolerance) 03/14/2011  . GERD (gastroesophageal reflux disease) 03/14/2011  . BACK PAIN, LUMBAR, CHRONIC 06/25/2009  . Obesity 02/17/2007  . Hypothyroidism 06/14/2006  . DIVERTICULOSIS, COLON  03/01/2006  . HIATAL HERNIA WITH REFLUX, HX OF 03/01/2006   Guadelupe Sabin, OTR/L  [phone] 03/09/2017, 5:04 PM  [place] [address] [place], [place], [address] Phone: [phone]   Fax:  [phone]  Name: JONDA ALANIS MRN: [id number] Date of Birth: July 24, 1950

## 2017-03-11 ENCOUNTER — Encounter (HOSPITAL_COMMUNITY): Payer: Self-pay | Admitting: Specialist

## 2017-03-11 ENCOUNTER — Ambulatory Visit (HOSPITAL_COMMUNITY): Payer: Medicare Other | Admitting: Specialist

## 2017-03-11 ENCOUNTER — Other Ambulatory Visit: Payer: Self-pay

## 2017-03-11 DIAGNOSIS — R29898 Other symptoms and signs involving the musculoskeletal system: Secondary | ICD-10-CM | POA: Diagnosis not present

## 2017-03-11 DIAGNOSIS — M25612 Stiffness of left shoulder, not elsewhere classified: Secondary | ICD-10-CM

## 2017-03-11 DIAGNOSIS — M25512 Pain in left shoulder: Secondary | ICD-10-CM | POA: Diagnosis not present

## 2017-03-11 NOTE — Therapy (Signed)
[place], [place], [address] Phone: [phone]   Fax:  [phone]  Occupational Therapy Treatment  Patient Details  Name: Victoria Reyes MRN: [id number] Date of Birth: Nov 19, 1950 Referring Provider: Dr. Meredith Pel   Encounter Date: 03/11/2017  OT End of Session - 03/11/17 1352    Visit Number  20    Number of Visits  31    Date for OT Re-Evaluation  04/08/17    Authorization Type  Medicare A & B    Authorization Time Period  Before 29th visit    Authorization - Visit Number  20    Authorization - Number of Visits  29    OT Start Time  [phone]    OT Stop Time  1030    OT Time Calculation (min)  40 min    Activity Tolerance  Patient tolerated treatment well    Behavior During Therapy  [facility] for tasks assessed/performed       Past Medical History:  Diagnosis Date  . Anemia    as a  young woman  . Diverticulitis 2005   CT   . DJD (degenerative joint disease)   . GERD (gastroesophageal reflux disease)   . Headache(784.0)   . Hiatal hernia   . Hyperlipidemia   . Hypertension   . Hypothyroidism   . Knee pain, right   . Low back pain   . Neuropathy    peroneal nerve  . Obesity   . Pneumonia    as child  . Pre-diabetes   . S/P colonoscopy 2005   Dr. Tamala Julian: sigmoid diverticulosis  . Thyroid disease   . Tobacco abuse     Past Surgical History:  Procedure Laterality Date  . BACK SURGERY    . COLONOSCOPY  12/18/2010   Procedure: COLONOSCOPY;  Surgeon: Dorothyann Peng, MD;  [facility];  Service: Endoscopy;  Laterality: N/A;  10:15am  . cyst removal right wrist    . EYE SURGERY Bilateral    cataract surgery with lens implant  . TONSILLECTOMY    . TOTAL SHOULDER ARTHROPLASTY Left 12/21/2016   Procedure: TOTAL SHOULDER ARTHROPLASTY;  Surgeon: Meredith Pel, MD;  Location: [place];  Service: Orthopedics;  Laterality: Left;  . TUBAL LIGATION    . umbillical hernia      There  were no vitals filed for this visit.  Subjective Assessment - 03/11/17 1351    Subjective   S:  I have been working on it.  I am trying to work some of the knots out.    Currently in Pain?  Yes    Pain Score  5     Pain Location  Shoulder    Pain Orientation  Left    Pain Descriptors / Indicators  Aching    Pain Type  Acute pain         [facility] OT Assessment - 03/11/17 0001      Assessment   Diagnosis  Left TSA      Precautions   Precautions  Shoulder    Type of Shoulder Precautions  ROM and strengthening as tolerated: er to 45 when stretching    Precaution Comments  Progress with ROM first, strengthening second when appropriate               OT Treatments/Exercises (OP) - 03/11/17 0001      Exercises   Exercises  Shoulder      Shoulder Exercises: Supine  Protraction  PROM;5 reps;AROM;12 reps    Horizontal ABduction  PROM;5 reps;AROM;12 reps    External Rotation  PROM;5 reps;AROM;12 reps    Internal Rotation  5 reps;PROM;AROM;12 reps    Flexion  PROM;5 reps;AROM;12 reps    ABduction  PROM;5 reps;AROM;12 reps      Shoulder Exercises: Seated   Protraction  AROM;12 reps    Horizontal ABduction  AROM;12 reps    External Rotation  AROM;12 reps    Internal Rotation  AROM;12 reps    Flexion  AROM;12 reps      Shoulder Exercises: Standing   Internal Rotation  Theraband;10 reps    Theraband Level (Shoulder Internal Rotation)  Level 2 (Red)    Extension  Theraband;10 reps    Theraband Level (Shoulder Extension)  Level 2 (Red)    Row  Theraband;10 reps    Theraband Level (Shoulder Row)  Level 2 (Red)      Shoulder Exercises: Therapy Ball   Right/Left  5 reps    Right/Left Limitations  assist initially for back, and then able to complete independently      Shoulder Exercises: ROM/Strengthening   Cybex Press  1 plate;10 reps    Cybex Row  1 plate;10 reps    Proximal Shoulder Strengthening, Supine  10X no rest breaks    Proximal Shoulder Strengthening, Seated   10 X with assist from therapist       Manual Therapy   Manual Therapy  Myofascial release    Manual therapy comments  completed separately from therapeutic exercise    Myofascial Release  Myofascial release to left upper arm, trapezius, and scapularis regions to decrease pain and fascial restrictions and increase joint range of motion.                OT Short Term Goals - 03/09/17 1437      OT SHORT TERM GOAL #1   Title  Pt will be provided with and educated on HEP.     Time  4    Period  Weeks    Status  On-going      OT SHORT TERM GOAL #2   Title  Pt will decrease LUE pain to 5/10 or less to improve ability to use LUE as assist during dressing tasks.     Time  4    Period  Weeks      OT SHORT TERM GOAL #3   Title  Pt will decrease fascial restrictions in LUE from max to moderate amounts to improve mobility in LUE during daily tasks.     Time  4    Period  Weeks      OT SHORT TERM GOAL #4   Title  Pt will improve P/ROM of LUE to [facility] to increase ability to complete ADL tasks at waist level.     Time  4    Period  Weeks    Status  Partially Met      OT SHORT TERM GOAL #5   Status  --        OT Long Term Goals - 01/14/17 1148      OT LONG TERM GOAL #1   Title  Pt will return to highest level of functioning and independence in B/ADL completion using LUE as non-dominant.     Time  8    Period  Weeks    Status  On-going      OT LONG TERM GOAL #2   Title  Pt will decrease  LUE fascial restrictions from mod to minimal amounts or less to improve mobility required for functional reaching tasks.     Time  8    Period  Weeks    Status  On-going      OT LONG TERM GOAL #3   Title  Pt will decrease LUE pain to 3/10 or less to improve use of LUE as an assist during B/ADL completion.     Time  8    Period  Weeks    Status  On-going      OT LONG TERM GOAL #4   Title  Pt will increase LUE A/ROM to [facility] to improve ability to reach up and perform grooming tasks.      Time  8    Period  Weeks    Status  On-going      OT LONG TERM GOAL #5   Title  Pt will improve LUE strength to 4/5 to improve ability to perform work tasks using LUE as non-dominant.    Time  8    Period  Weeks    Status  On-going            Plan - 03/11/17 1352    Clinical Impression Statement  A:  Patient session focused on building strength in available range.  Patient continues to require verbal and tactile cueing for depressing shoulder blade when reaching and flexing shoulder.     OT Treatment/Interventions  Self-care/ADL training;Therapeutic exercise;Patient/family education;Manual Therapy;Ultrasound;Therapeutic activities;Cryotherapy;Electrical Stimulation;Moist Heat;Passive range of motion    Plan  P:  Increase repetitions with cybex press and row, add UBE for proximal shoulder strength, add overhead lace.        Patient will benefit from skilled therapeutic intervention in order to improve the following deficits and impairments:     Visit Diagnosis: Acute pain of left shoulder  Other symptoms and signs involving the musculoskeletal system  Stiffness of left shoulder, not elsewhere classified    Problem List Patient Active Problem List   Diagnosis Date Noted  . Neck pain on left side 01/20/2016  . Shoulder pain, left 11/26/2015  . Cutaneous wart 09/30/2014  . HTN (hypertension) 01/01/2013  . Hyperlipidemia with target LDL less than 100 05/04/2012  . IGT (impaired glucose tolerance) 03/14/2011  . GERD (gastroesophageal reflux disease) 03/14/2011  . BACK PAIN, LUMBAR, CHRONIC 06/25/2009  . Obesity 02/17/2007  . Hypothyroidism 06/14/2006  . DIVERTICULOSIS, COLON 03/01/2006  . HIATAL HERNIA WITH REFLUX, HX OF 03/01/2006    Vangie Bicker, [place], OTR/L [phone]  03/11/2017, 1:58 PM  [place], [place], [address] Phone: [phone]   Fax:  [phone]  Name: Victoria Reyes MRN:  [id number] Date of Birth: 08-Jun-1950

## 2017-03-14 ENCOUNTER — Ambulatory Visit (HOSPITAL_COMMUNITY): Payer: Medicare Other | Attending: Orthopedic Surgery | Admitting: Specialist

## 2017-03-14 ENCOUNTER — Telehealth: Payer: Self-pay | Admitting: Family Medicine

## 2017-03-14 DIAGNOSIS — M25512 Pain in left shoulder: Secondary | ICD-10-CM | POA: Diagnosis not present

## 2017-03-14 DIAGNOSIS — M25612 Stiffness of left shoulder, not elsewhere classified: Secondary | ICD-10-CM

## 2017-03-14 DIAGNOSIS — R29898 Other symptoms and signs involving the musculoskeletal system: Secondary | ICD-10-CM

## 2017-03-14 NOTE — Therapy (Signed)
[place], [place], [address] Phone: [phone]   Fax:  [phone]  Occupational Therapy Treatment  Patient Details  Name: Victoria Reyes MRN: [id number] Date of Birth: 1950/11/07 Referring Provider: Dr. Meredith Pel   Encounter Date: 03/14/2017  OT End of Session - 03/14/17 1206    Visit Number  21    Number of Visits  31    Date for OT Re-Evaluation  04/08/17    Authorization Type  Medicare A & B    Authorization Time Period  Before 29th visit    Authorization - Visit Number  21    Authorization - Number of Visits  29    OT Start Time  1115    OT Stop Time  1200    OT Time Calculation (min)  45 min    Activity Tolerance  Patient tolerated treatment well    Behavior During Therapy  [facility] for tasks assessed/performed       Past Medical History:  Diagnosis Date  . Anemia    as a  young woman  . Diverticulitis 2005   CT   . DJD (degenerative joint disease)   . GERD (gastroesophageal reflux disease)   . Headache(784.0)   . Hiatal hernia   . Hyperlipidemia   . Hypertension   . Hypothyroidism   . Knee pain, right   . Low back pain   . Neuropathy    peroneal nerve  . Obesity   . Pneumonia    as child  . Pre-diabetes   . S/P colonoscopy 2005   Dr. Tamala Julian: sigmoid diverticulosis  . Thyroid disease   . Tobacco abuse     Past Surgical History:  Procedure Laterality Date  . BACK SURGERY    . COLONOSCOPY  12/18/2010   Procedure: COLONOSCOPY;  Surgeon: Dorothyann Peng, MD;  [facility];  Service: Endoscopy;  Laterality: N/A;  10:15am  . cyst removal right wrist    . EYE SURGERY Bilateral    cataract surgery with lens implant  . TONSILLECTOMY    . TOTAL SHOULDER ARTHROPLASTY Left 12/21/2016   Procedure: TOTAL SHOULDER ARTHROPLASTY;  Surgeon: Meredith Pel, MD;  Location: [place];  Service: Orthopedics;  Laterality: Left;  . TUBAL LIGATION    . umbillical hernia      There  were no vitals filed for this visit.  Subjective Assessment - 03/14/17 1205    Subjective   S:  I am doing my exercises and my daughter tells me if I am not doing them correctly    Currently in Pain?  Yes    Pain Score  3     Pain Location  Shoulder    Pain Orientation  Left    Pain Descriptors / Indicators  Aching    Pain Type  Acute pain         [facility] OT Assessment - 03/14/17 0001      Assessment   Diagnosis  Left TSA      Precautions   Precautions  Shoulder    Type of Shoulder Precautions  ROM and strengthening as tolerated: er to 45 when stretching    Precaution Comments  Progress with ROM first, strengthening second when appropriate               OT Treatments/Exercises (OP) - 03/14/17 0001      Exercises   Exercises  Shoulder      Shoulder Exercises: Supine  Protraction  PROM;5 reps;AROM;15 reps    Horizontal ABduction  PROM;5 reps;AROM;15 reps    External Rotation  PROM;5 reps;AROM;15 reps    Internal Rotation  PROM;5 reps;AROM;15 reps    Flexion  5 reps;PROM;AROM;15 reps    ABduction  PROM;5 reps;AROM;15 reps      Shoulder Exercises: Seated   Protraction  AROM;15 reps    Horizontal ABduction  AROM;15 reps    External Rotation  AROM;15 reps    Internal Rotation  AROM;15 reps    Flexion  AROM;15 reps    Other Seated Exercises  scapular depression with blue tband 10 times with max verbal guidance for technique    Other Seated Exercises  scapular retraction with blue tband at waist level and chest levle 10 times each       Shoulder Exercises: ROM/Strengthening   UBE (Upper Arm Bike)  Level 1 2' forward 2' reverse    Cybex Press  1.5 plate;15 reps    Cybex Row  1.5 plate;15 reps    Proximal Shoulder Strengthening, Supine  10X no rest breaks    Proximal Shoulder Strengthening, Seated  10 X with assist from therapist  verbal guidance with vertical movement for  less elbow movement and more shoulder movement       Manual Therapy   Manual Therapy   Myofascial release    Manual therapy comments  completed separately from therapeutic exercise    Myofascial Release  Myofascial release to left upper arm, trapezius, and scapularis regions to decrease pain and fascial restrictions and increase joint range of motion.                OT Short Term Goals - 03/09/17 1437      OT SHORT TERM GOAL #1   Title  Pt will be provided with and educated on HEP.     Time  4    Period  Weeks    Status  On-going      OT SHORT TERM GOAL #2   Title  Pt will decrease LUE pain to 5/10 or less to improve ability to use LUE as assist during dressing tasks.     Time  4    Period  Weeks      OT SHORT TERM GOAL #3   Title  Pt will decrease fascial restrictions in LUE from max to moderate amounts to improve mobility in LUE during daily tasks.     Time  4    Period  Weeks      OT SHORT TERM GOAL #4   Title  Pt will improve P/ROM of LUE to [facility] to increase ability to complete ADL tasks at waist level.     Time  4    Period  Weeks    Status  Partially Met      OT SHORT TERM GOAL #5   Status  --        OT Long Term Goals - 01/14/17 1148      OT LONG TERM GOAL #1   Title  Pt will return to highest level of functioning and independence in B/ADL completion using LUE as non-dominant.     Time  8    Period  Weeks    Status  On-going      OT LONG TERM GOAL #2   Title  Pt will decrease LUE fascial restrictions from mod to minimal amounts or less to improve mobility required for functional reaching tasks.     Time  8    Period  Weeks    Status  On-going      OT LONG TERM GOAL #3   Title  Pt will decrease LUE pain to 3/10 or less to improve use of LUE as an assist during B/ADL completion.     Time  8    Period  Weeks    Status  On-going      OT LONG TERM GOAL #4   Title  Pt will increase LUE A/ROM to [facility] to improve ability to reach up and perform grooming tasks.     Time  8    Period  Weeks    Status  On-going      OT LONG TERM GOAL #5    Title  Pt will improve LUE strength to 4/5 to improve ability to perform work tasks using LUE as non-dominant.    Time  8    Period  Weeks    Status  On-going            Plan - 03/14/17 1206    Clinical Impression Statement  A:  Patient session focused on correct positioning with scapula (depressed) during completion of exercises.      Plan  P:  add overhead lace, continue scapular depression strengthening for improved alignment of scapula with functional activities        Patient will benefit from skilled therapeutic intervention in order to improve the following deficits and impairments:  Decreased scar mobility, Decreased strength, Decreased range of motion, Pain, Increased fascial restrictions  Visit Diagnosis: Acute pain of left shoulder  Other symptoms and signs involving the musculoskeletal system  Stiffness of left shoulder, not elsewhere classified    Problem List Patient Active Problem List   Diagnosis Date Noted  . Neck pain on left side 01/20/2016  . Shoulder pain, left 11/26/2015  . Cutaneous wart 09/30/2014  . HTN (hypertension) 01/01/2013  . Hyperlipidemia with target LDL less than 100 05/04/2012  . IGT (impaired glucose tolerance) 03/14/2011  . GERD (gastroesophageal reflux disease) 03/14/2011  . BACK PAIN, LUMBAR, CHRONIC 06/25/2009  . Obesity 02/17/2007  . Hypothyroidism 06/14/2006  . DIVERTICULOSIS, COLON 03/01/2006  . HIATAL HERNIA WITH REFLUX, HX OF 03/01/2006    Vangie Bicker, [place], OTR/L [phone]  03/14/2017, 12:12 PM  [place], [place], [address] Phone: [phone]   Fax:  [phone]  Name: Victoria Reyes MRN: [id number] Date of Birth: 1951/01/07

## 2017-03-14 NOTE — Telephone Encounter (Signed)
Blood pressure medication has been recalled patient is requesting a call back [place]: [facility] Cb#: [phone]

## 2017-03-14 NOTE — Telephone Encounter (Signed)
I only see the recall on amlodipine/valsartan combo drug and amlodipine/valsartan/HCTZ combo drug.

## 2017-03-16 ENCOUNTER — Ambulatory Visit (HOSPITAL_COMMUNITY): Payer: Medicare Other | Admitting: Occupational Therapy

## 2017-03-16 ENCOUNTER — Telehealth: Payer: Self-pay | Admitting: Family Medicine

## 2017-03-16 ENCOUNTER — Other Ambulatory Visit: Payer: Self-pay

## 2017-03-16 ENCOUNTER — Encounter (HOSPITAL_COMMUNITY): Payer: Self-pay | Admitting: Occupational Therapy

## 2017-03-16 DIAGNOSIS — M25612 Stiffness of left shoulder, not elsewhere classified: Secondary | ICD-10-CM | POA: Diagnosis not present

## 2017-03-16 DIAGNOSIS — M25512 Pain in left shoulder: Secondary | ICD-10-CM | POA: Diagnosis not present

## 2017-03-16 DIAGNOSIS — E038 Other specified hypothyroidism: Secondary | ICD-10-CM | POA: Diagnosis not present

## 2017-03-16 DIAGNOSIS — R29898 Other symptoms and signs involving the musculoskeletal system: Secondary | ICD-10-CM

## 2017-03-16 LAB — TSH: TSH: 3.27 mIU/L (ref 0.40–4.50)

## 2017-03-16 NOTE — Therapy (Signed)
[facility] [address] [place], [place], [address] Phone: [phone]   Fax:  [phone]  Occupational Therapy Treatment  Patient Details  Name: Victoria Reyes MRN: [id number] Date of Birth: 11/09/1950 Referring Provider: Dr. Gregory Scott Dean   Encounter Date: 03/16/2017  OT End of Session - 03/16/17 1341    Visit Number  22    Number of Visits  31    Date for OT Re-Evaluation  04/08/17    Authorization Type  Medicare A & B    Authorization Time Period  Before 29th visit    Authorization - Visit Number  22    Authorization - Number of Visits  29    OT Start Time  1257    OT Stop Time  1340    OT Time Calculation (min)  43 min    Activity Tolerance  Patient tolerated treatment well    Behavior During Therapy  WFL for tasks assessed/performed       Past Medical History:  Diagnosis Date  . Anemia    as a  young woman  . Diverticulitis 2005   CT   . DJD (degenerative joint disease)   . GERD (gastroesophageal reflux disease)   . Headache(784.0)   . Hiatal hernia   . Hyperlipidemia   . Hypertension   . Hypothyroidism   . Knee pain, right   . Low back pain   . Neuropathy    peroneal nerve  . Obesity   . Pneumonia    as child  . Pre-diabetes   . S/P colonoscopy 2005   Dr. Smith: sigmoid diverticulosis  . Thyroid disease   . Tobacco abuse     Past Surgical History:  Procedure Laterality Date  . BACK SURGERY    . COLONOSCOPY  12/18/2010   Procedure: COLONOSCOPY;  Surgeon: Sandi M Fields, MD;  [facility];  Service: Endoscopy;  Laterality: N/A;  10:15am  . cyst removal right wrist    . EYE SURGERY Bilateral    cataract surgery with lens implant  . TONSILLECTOMY    . TOTAL SHOULDER ARTHROPLASTY Left 12/21/2016   Procedure: TOTAL SHOULDER ARTHROPLASTY;  Surgeon: Dean, Gregory Scott, MD;  [facility];  Service: Orthopedics;  Laterality: Left;  . TUBAL LIGATION    . umbillical hernia      There  were no vitals filed for this visit.  Subjective Assessment - 03/16/17 1251    Subjective   S: I put my hair in a ponytail today.     Currently in Pain?  No/denies         [facility] OT Assessment - 03/16/17 1250      Assessment   Diagnosis  Left TSA      Precautions   Precautions  Shoulder    Type of Shoulder Precautions  ROM and strengthening as tolerated: er to 45 when stretching    Precaution Comments  Progress with ROM first, strengthening second when appropriate               OT Treatments/Exercises (OP) - 03/16/17 1259      Exercises   Exercises  Shoulder      Shoulder Exercises: Supine   Protraction  PROM;5 reps;Strengthening;12 reps    Protraction Weight (lbs)  1    Horizontal ABduction  PROM;5 reps;Strengthening;12 reps    Horizontal ABduction Weight (lbs)  1    External Rotation  PROM;5 reps;Strengthening;12 reps    External Rotation Weight (  lbs)  1    Internal Rotation  PROM;5 reps;Strengthening;12 reps    Internal Rotation Weight (lbs)  1    Flexion  PROM;5 reps;Strengthening;12 reps    Shoulder Flexion Weight (lbs)  1    ABduction  PROM;5 reps;Strengthening;12 reps    Shoulder ABduction Weight (lbs)  1      Shoulder Exercises: Seated   Protraction  Strengthening;10 reps    Protraction Weight (lbs)  1    Horizontal ABduction  Strengthening;10 reps    Horizontal ABduction Weight (lbs)  1    External Rotation  Strengthening;10 reps    External Rotation Weight (lbs)  1    Internal Rotation  Strengthening;10 reps    Internal Rotation Weight (lbs)  1    Flexion  Strengthening;10 reps    Flexion Weight (lbs)  1    Abduction  AROM;15 reps    Other Seated Exercises  scapular depression with blue tband 10 times with max verbal guidance for technique      Shoulder Exercises: ROM/Strengthening   Cybex Press  1.5 plate;15 reps    Cybex Row  1.5 plate;15 reps    Over Head Lace  1'    X to V Arms  10X    Proximal Shoulder Strengthening, Supine  10X each,  1#,  no rest breaks    Proximal Shoulder Strengthening, Seated  10X with assist from therapist  verbal guidance with vertical movement for  less elbow movement and more shoulder movement       Shoulder Exercises: Stretch   Internal Rotation Stretch  3 reps 10 seconds      Manual Therapy   Manual Therapy  Myofascial release    Manual therapy comments  completed separately from therapeutic exercise    Myofascial Release  Myofascial release to left upper arm, trapezius, and scapularis regions to decrease pain and fascial restrictions and increase joint range of motion.                OT Short Term Goals - 03/09/17 1437      OT SHORT TERM GOAL #1   Title  Pt will be provided with and educated on HEP.     Time  4    Period  Weeks    Status  On-going      OT SHORT TERM GOAL #2   Title  Pt will decrease LUE pain to 5/10 or less to improve ability to use LUE as assist during dressing tasks.     Time  4    Period  Weeks      OT SHORT TERM GOAL #3   Title  Pt will decrease fascial restrictions in LUE from max to moderate amounts to improve mobility in LUE during daily tasks.     Time  4    Period  Weeks      OT SHORT TERM GOAL #4   Title  Pt will improve P/ROM of LUE to WFL to increase ability to complete ADL tasks at waist level.     Time  4    Period  Weeks    Status  Partially Met      OT SHORT TERM GOAL #5   Status  --        OT Long Term Goals - 01/14/17 1148      OT LONG TERM GOAL #1   Title  Pt will return to highest level of functioning and independence in B/ADL completion using LUE as non-dominant.       Time  8    Period  Weeks    Status  On-going      OT LONG TERM GOAL #2   Title  Pt will decrease LUE fascial restrictions from mod to minimal amounts or less to improve mobility required for functional reaching tasks.     Time  8    Period  Weeks    Status  On-going      OT LONG TERM GOAL #3   Title  Pt will decrease LUE pain to 3/10 or less to improve  use of LUE as an assist during B/ADL completion.     Time  8    Period  Weeks    Status  On-going      OT LONG TERM GOAL #4   Title  Pt will increase LUE A/ROM to [facility] to improve ability to reach up and perform grooming tasks.     Time  8    Period  Weeks    Status  On-going      OT LONG TERM GOAL #5   Title  Pt will improve LUE strength to 4/5 to improve ability to perform work tasks using LUE as non-dominant.    Time  8    Period  Weeks    Status  On-going            Plan - 03/16/17 1327    Clinical Impression Statement  A: Added 1# weight in supine and sitting this session, pt with continued difficulty with scapular depression, added x to v arms, IR towel stretch, and overhead lacing. Occasional verbal cuing for form and technique. Pt reports she is now able to clean her house with less difficulty and is moving her arm more at home.     Plan  P: Continue with scapular depression working on form, add wall stretch flexion and continue with IR with towel. Functional reaching task    OT Home Exercise Plan  10/3: table slides; 10/5: scapular A/ROM    Consulted and Agree with Plan of Care  Patient       Patient will benefit from skilled therapeutic intervention in order to improve the following deficits and impairments:  Decreased scar mobility, Decreased strength, Decreased range of motion, Pain, Increased fascial restrictions  Visit Diagnosis: Acute pain of left shoulder  Other symptoms and signs involving the musculoskeletal system  Stiffness of left shoulder, not elsewhere classified    Problem List Patient Active Problem List   Diagnosis Date Noted  . Neck pain on left side 01/20/2016  . Shoulder pain, left 11/26/2015  . Cutaneous wart 09/30/2014  . HTN (hypertension) 01/01/2013  . Hyperlipidemia with target LDL less than 100 05/04/2012  . IGT (impaired glucose tolerance) 03/14/2011  . GERD (gastroesophageal reflux disease) 03/14/2011  . BACK PAIN, LUMBAR,  CHRONIC 06/25/2009  . Obesity 02/17/2007  . Hypothyroidism 06/14/2006  . DIVERTICULOSIS, COLON 03/01/2006  . HIATAL HERNIA WITH REFLUX, HX OF 03/01/2006   Guadelupe Sabin, OTR/L  [phone] 03/16/2017, 1:43 PM  [facility] [address] [place], [place], [address] Phone: [phone]   Fax:  [phone]  Name: Victoria Reyes MRN: [id number] Date of Birth: [birth year]

## 2017-03-17 ENCOUNTER — Encounter: Payer: Self-pay | Admitting: Family Medicine

## 2017-03-18 ENCOUNTER — Ambulatory Visit (HOSPITAL_COMMUNITY): Payer: Medicare Other

## 2017-03-18 ENCOUNTER — Encounter (HOSPITAL_COMMUNITY): Payer: Self-pay

## 2017-03-18 DIAGNOSIS — R29898 Other symptoms and signs involving the musculoskeletal system: Secondary | ICD-10-CM

## 2017-03-18 DIAGNOSIS — M25512 Pain in left shoulder: Secondary | ICD-10-CM | POA: Diagnosis not present

## 2017-03-18 DIAGNOSIS — M25612 Stiffness of left shoulder, not elsewhere classified: Secondary | ICD-10-CM

## 2017-03-18 NOTE — Therapy (Signed)
[place], [place], [address] Phone: [phone]   Fax:  [phone]  Occupational Therapy Treatment  Patient Details  Name: Victoria Reyes MRN: [id number] Date of Birth: 1950/11/17 Referring Provider: Dr. Meredith Pel   Encounter Date: 03/18/2017  OT End of Session - 03/18/17 1112    Visit Number  23    Number of Visits  31    Date for OT Re-Evaluation  04/08/17    Authorization Type  Medicare A & B    Authorization Time Period  Before 29th visit    Authorization - Visit Number  23    Authorization - Number of Visits  29    OT Start Time  1034    OT Stop Time  1115    OT Time Calculation (min)  41 min    Activity Tolerance  Patient tolerated treatment well    Behavior During Therapy  [facility] for tasks assessed/performed       Past Medical History:  Diagnosis Date  . Anemia    as a  young woman  . Diverticulitis 2005   CT   . DJD (degenerative joint disease)   . GERD (gastroesophageal reflux disease)   . Headache(784.0)   . Hiatal hernia   . Hyperlipidemia   . Hypertension   . Hypothyroidism   . Knee pain, right   . Low back pain   . Neuropathy    peroneal nerve  . Obesity   . Pneumonia    as child  . Pre-diabetes   . S/P colonoscopy 2005   Dr. Tamala Julian: sigmoid diverticulosis  . Thyroid disease   . Tobacco abuse     Past Surgical History:  Procedure Laterality Date  . BACK SURGERY    . COLONOSCOPY  12/18/2010   Procedure: COLONOSCOPY;  Surgeon: Dorothyann Peng, MD;  [facility];  Service: Endoscopy;  Laterality: N/A;  10:15am  . cyst removal right wrist    . EYE SURGERY Bilateral    cataract surgery with lens implant  . TONSILLECTOMY    . TOTAL SHOULDER ARTHROPLASTY Left 12/21/2016   Procedure: TOTAL SHOULDER ARTHROPLASTY;  Surgeon: Meredith Pel, MD;  Location: [place];  Service: Orthopedics;  Laterality: Left;  . TUBAL LIGATION    . umbillical hernia      There  were no vitals filed for this visit.  Subjective Assessment - 03/18/17 1047    Subjective   S: My arm is getting better.    Currently in Pain?  No/denies         [facility] OT Assessment - 03/18/17 1047      Assessment   Diagnosis  Left TSA      Precautions   Precautions  Shoulder    Type of Shoulder Precautions  ROM and strengthening as tolerated: er to 45 when stretching    Precaution Comments  Progress with ROM first, strengthening second when appropriate               OT Treatments/Exercises (OP) - 03/18/17 1048      Exercises   Exercises  Shoulder      Shoulder Exercises: Supine   Protraction  PROM;5 reps;Strengthening;12 reps    Protraction Weight (lbs)  1    Horizontal ABduction  PROM;5 reps;Strengthening;12 reps    Horizontal ABduction Weight (lbs)  1    External Rotation  PROM;5 reps;Strengthening;12 reps    External Rotation Weight (lbs)  1  Internal Rotation  PROM;5 reps;Strengthening;12 reps    Internal Rotation Weight (lbs)  1    Flexion  PROM;5 reps;Strengthening;12 reps    Shoulder Flexion Weight (lbs)  1    ABduction  PROM;5 reps;Strengthening;12 reps    Shoulder ABduction Weight (lbs)  1      Shoulder Exercises: Seated   Protraction  Strengthening;10 reps    Protraction Weight (lbs)  1    Horizontal ABduction  Strengthening;10 reps    Horizontal ABduction Weight (lbs)  1    External Rotation  Strengthening;10 reps    External Rotation Weight (lbs)  1    Internal Rotation  Strengthening;10 reps    Internal Rotation Weight (lbs)  1    Flexion  Strengthening;10 reps    Flexion Weight (lbs)  1    Abduction  Strengthening;10 reps    ABduction Weight (lbs)  1      Shoulder Exercises: Standing   Other Standing Exercises  Scapular depressiong; blue theraband; 10X; with verbal and physical cues.      Shoulder Exercises: ROM/Strengthening   X to V Arms  10X    Proximal Shoulder Strengthening, Supine  10X each, 1#,  no rest breaks    Proximal  Shoulder Strengthening, Seated  10X with 1# one rest break      Shoulder Exercises: Stretch   Internal Rotation Stretch  3 reps 10 seconds; horizontal towel    Wall Stretch - Flexion  2 reps;10 seconds      Functional Reaching Activities   Mid Level  While seated, patient completed resistive clothespin task to work on functional reaching. Focused on posture and technique during task. Patient able to place pins to 38 cm on vertical pole.        Manual Therapy   Manual Therapy  Myofascial release    Manual therapy comments  completed separately from therapeutic exercise    Myofascial Release  Myofascial release to left upper arm, trapezius, and scapularis regions to decrease pain and fascial restrictions and increase joint range of motion.                OT Short Term Goals - 03/09/17 1437      OT SHORT TERM GOAL #1   Title  Pt will be provided with and educated on HEP.     Time  4    Period  Weeks    Status  On-going      OT SHORT TERM GOAL #2   Title  Pt will decrease LUE pain to 5/10 or less to improve ability to use LUE as assist during dressing tasks.     Time  4    Period  Weeks      OT SHORT TERM GOAL #3   Title  Pt will decrease fascial restrictions in LUE from max to moderate amounts to improve mobility in LUE during daily tasks.     Time  4    Period  Weeks      OT SHORT TERM GOAL #4   Title  Pt will improve P/ROM of LUE to [facility] to increase ability to complete ADL tasks at waist level.     Time  4    Period  Weeks    Status  Partially Met      OT SHORT TERM GOAL #5   Status  --        OT Long Term Goals - 01/14/17 1148      OT LONG TERM GOAL #  1   Title  Pt will return to highest level of functioning and independence in B/ADL completion using LUE as non-dominant.     Time  8    Period  Weeks    Status  On-going      OT LONG TERM GOAL #2   Title  Pt will decrease LUE fascial restrictions from mod to minimal amounts or less to improve mobility  required for functional reaching tasks.     Time  8    Period  Weeks    Status  On-going      OT LONG TERM GOAL #3   Title  Pt will decrease LUE pain to 3/10 or less to improve use of LUE as an assist during B/ADL completion.     Time  8    Period  Weeks    Status  On-going      OT LONG TERM GOAL #4   Title  Pt will increase LUE A/ROM to [facility] to improve ability to reach up and perform grooming tasks.     Time  8    Period  Weeks    Status  On-going      OT LONG TERM GOAL #5   Title  Pt will improve LUE strength to 4/5 to improve ability to perform work tasks using LUE as non-dominant.    Time  8    Period  Weeks    Status  On-going            Plan - 03/18/17 1149    Clinical Impression Statement  A: Session focused on strengthening with 1# weight supine and seated. patient did required VC for form and technique. Added wall flexion stretch.     Plan  P: Continue with plan of care working on scapular depression, shoulder stretches and functional reaching.        Patient will benefit from skilled therapeutic intervention in order to improve the following deficits and impairments:  Decreased scar mobility, Decreased strength, Decreased range of motion, Pain, Increased fascial restrictions  Visit Diagnosis: Acute pain of left shoulder  Other symptoms and signs involving the musculoskeletal system  Stiffness of left shoulder, not elsewhere classified    Problem List Patient Active Problem List   Diagnosis Date Noted  . Neck pain on left side 01/20/2016  . Shoulder pain, left 11/26/2015  . Cutaneous wart 09/30/2014  . HTN (hypertension) 01/01/2013  . Hyperlipidemia with target LDL less than 100 05/04/2012  . IGT (impaired glucose tolerance) 03/14/2011  . GERD (gastroesophageal reflux disease) 03/14/2011  . BACK PAIN, LUMBAR, CHRONIC 06/25/2009  . Obesity 02/17/2007  . Hypothyroidism 06/14/2006  . DIVERTICULOSIS, COLON 03/01/2006  . HIATAL HERNIA WITH REFLUX, HX OF  03/01/2006   Ailene Ravel, OTR/L,CBIS  [phone]  03/18/2017, 11:52 AM  [facility] [address] [place], [place], [address] Phone: [phone]   Fax:  [phone]  Name: Victoria Reyes MRN: [id number] Date of Birth: [birth year]

## 2017-03-21 ENCOUNTER — Ambulatory Visit (HOSPITAL_COMMUNITY): Payer: Medicare Other | Admitting: Specialist

## 2017-03-23 ENCOUNTER — Encounter (HOSPITAL_COMMUNITY): Payer: Self-pay | Admitting: Occupational Therapy

## 2017-03-23 ENCOUNTER — Other Ambulatory Visit: Payer: Self-pay

## 2017-03-23 ENCOUNTER — Ambulatory Visit (HOSPITAL_COMMUNITY): Payer: Medicare Other | Admitting: Occupational Therapy

## 2017-03-23 DIAGNOSIS — R29898 Other symptoms and signs involving the musculoskeletal system: Secondary | ICD-10-CM

## 2017-03-23 DIAGNOSIS — M25512 Pain in left shoulder: Secondary | ICD-10-CM

## 2017-03-23 DIAGNOSIS — M25612 Stiffness of left shoulder, not elsewhere classified: Secondary | ICD-10-CM

## 2017-03-23 NOTE — Therapy (Signed)
[place], [place], [address] Phone: [phone]   Fax:  [phone]  Occupational Therapy Treatment  Patient Details  Name: Victoria Reyes MRN: [id number] Date of Birth: 12/11/1950 Referring Provider (Historical): Dr. Meredith Pel   Encounter Date: 03/23/2017  OT End of Session - 03/23/17 1134    Visit Number  24    Number of Visits  31    Date for OT Re-Evaluation  04/08/17    Authorization Type  Medicare A & B    Authorization Time Period  Before 29th visit    Authorization - Visit Number  24    Authorization - Number of Visits  29    OT Start Time  7124    OT Stop Time  1117    OT Time Calculation (min)  42 min    Activity Tolerance  Patient tolerated treatment well    Behavior During Therapy  [facility] for tasks assessed/performed       Past Medical History:  Diagnosis Date  . Anemia    as a  young woman  . Diverticulitis 2005   CT   . DJD (degenerative joint disease)   . GERD (gastroesophageal reflux disease)   . Headache(784.0)   . Hiatal hernia   . Hyperlipidemia   . Hypertension   . Hypothyroidism   . Knee pain, right   . Low back pain   . Neuropathy    peroneal nerve  . Obesity   . Pneumonia    as child  . Pre-diabetes   . S/P colonoscopy 2005   Dr. Tamala Julian: sigmoid diverticulosis  . Thyroid disease   . Tobacco abuse     Past Surgical History:  Procedure Laterality Date  . BACK SURGERY    . COLONOSCOPY  12/18/2010   Procedure: COLONOSCOPY;  Surgeon: Dorothyann Peng, MD;  [facility];  Service: Endoscopy;  Laterality: N/A;  10:15am  . cyst removal right wrist    . EYE SURGERY Bilateral    cataract surgery with lens implant  . TONSILLECTOMY    . TOTAL SHOULDER ARTHROPLASTY Left 12/21/2016   Procedure: TOTAL SHOULDER ARTHROPLASTY;  Surgeon: Meredith Pel, MD;  Location: [place];  Service: Orthopedics;  Laterality: Left;  . TUBAL LIGATION    . umbillical hernia       There were no vitals filed for this visit.  Subjective Assessment - 03/23/17 1035    Subjective   S: I still do therapy at home even when I don't come here.     Currently in Pain?  No/denies         [facility] OT Assessment - 03/23/17 1034      Assessment   Medical Diagnosis  Left TSA      Precautions   Precautions  Shoulder    Type of Shoulder Precautions  ROM and strengthening as tolerated: er to 45 when stretching    Precaution Comments  Progress with ROM first, strengthening second when appropriate               OT Treatments/Exercises (OP) - 03/23/17 1038      Exercises   Exercises  Shoulder      Shoulder Exercises: Supine   Protraction  PROM;5 reps;Strengthening;12 reps    Protraction Weight (lbs)  1    Horizontal ABduction  PROM;5 reps;Strengthening;12 reps    Horizontal ABduction Weight (lbs)  1    External Rotation  PROM;5 reps;Strengthening;12 reps  External Rotation Weight (lbs)  1    Internal Rotation  PROM;5 reps;Strengthening;12 reps    Internal Rotation Weight (lbs)  1    Flexion  PROM;5 reps;Strengthening;12 reps    Shoulder Flexion Weight (lbs)  1    ABduction  PROM;5 reps;Strengthening;12 reps    Shoulder ABduction Weight (lbs)  1      Shoulder Exercises: Seated   Protraction  Strengthening;15 reps    Protraction Weight (lbs)  1    Horizontal ABduction  Strengthening;10 reps    Horizontal ABduction Weight (lbs)  1    External Rotation  Strengthening;10 reps    External Rotation Weight (lbs)  1    Internal Rotation  Strengthening;10 reps    Internal Rotation Weight (lbs)  1    Flexion  Strengthening;10 reps    Flexion Weight (lbs)  1    Abduction  Strengthening;10 reps    ABduction Weight (lbs)  1    Other Seated Exercises  scapular depression with blue tband 10 times with max verbal guidance for technique      Shoulder Exercises: Standing   Extension  Theraband;10 reps    Theraband Level (Shoulder Extension)  Level 2 (Red)    Row   Theraband;10 reps    Theraband Level (Shoulder Row)  Level 2 (Red)    Retraction  Theraband;10 reps    Theraband Level (Shoulder Retraction)  Level 2 (Red)      Shoulder Exercises: ROM/Strengthening   Cybex Press  1.5 plate;15 reps    Cybex Row  1.5 plate;15 reps    Over Head Lace  1' 30"     X to V Arms  10X    Proximal Shoulder Strengthening, Supine  10X each, 1#,  no rest breaks    Proximal Shoulder Strengthening, Seated  10X with 1# one rest break      Shoulder Exercises: Stretch   Internal Rotation Stretch  3 reps 10 seconds, horizontal towel    Wall Stretch - Flexion  3 reps;10 seconds      Manual Therapy   Manual Therapy  Myofascial release    Manual therapy comments  completed separately from therapeutic exercise    Myofascial Release  Myofascial release to left upper arm, trapezius, and scapularis regions to decrease pain and fascial restrictions and increase joint range of motion.                OT Short Term Goals - 03/09/17 1437      OT SHORT TERM GOAL #1   Title  Pt will be provided with and educated on HEP.     Time  4    Period  Weeks    Status  On-going      OT SHORT TERM GOAL #2   Title  Pt will decrease LUE pain to 5/10 or less to improve ability to use LUE as assist during dressing tasks.     Time  4    Period  Weeks      OT SHORT TERM GOAL #3   Title  Pt will decrease fascial restrictions in LUE from max to moderate amounts to improve mobility in LUE during daily tasks.     Time  4    Period  Weeks      OT SHORT TERM GOAL #4   Title  Pt will improve P/ROM of LUE to [facility] to increase ability to complete ADL tasks at waist level.     Time  4  Period  Weeks    Status  Partially Met      OT SHORT TERM GOAL #5   Status  --        OT Long Term Goals - 01/14/17 1148      OT LONG TERM GOAL #1   Title  Pt will return to highest level of functioning and independence in B/ADL completion using LUE as non-dominant.     Time  8    Period  Weeks     Status  On-going      OT LONG TERM GOAL #2   Title  Pt will decrease LUE fascial restrictions from mod to minimal amounts or less to improve mobility required for functional reaching tasks.     Time  8    Period  Weeks    Status  On-going      OT LONG TERM GOAL #3   Title  Pt will decrease LUE pain to 3/10 or less to improve use of LUE as an assist during B/ADL completion.     Time  8    Period  Weeks    Status  On-going      OT LONG TERM GOAL #4   Title  Pt will increase LUE A/ROM to [facility] to improve ability to reach up and perform grooming tasks.     Time  8    Period  Weeks    Status  On-going      OT LONG TERM GOAL #5   Title  Pt will improve LUE strength to 4/5 to improve ability to perform work tasks using LUE as non-dominant.    Time  8    Period  Weeks    Status  On-going            Plan - 03/23/17 1134    Clinical Impression Statement  A: Session focusing on strengthening and activity tolerance, continued with shoulder stretches. Verbal cuing for form and technique intermittently throughout session, occasional rest breaks for fatigue. Pt reports she is able to reach higher with her arm now.     Occupational Profile and client history currently impacting functional performance  Pt was independent prior to surgery and is highly motivated to return to prior level of independence; is also planning to come out of retirement and return to work    Plan  P: Continue working on shoulder stretches adding er and abduction stretches. Attempt ball on wall for scapular strengthening    Consulted and Agree with Plan of Care  Patient       Patient will benefit from skilled therapeutic intervention in order to improve the following deficits and impairments:     Visit Diagnosis: Acute pain of left shoulder  Other symptoms and signs involving the musculoskeletal system  Stiffness of left shoulder, not elsewhere classified    Problem List Patient Active Problem List    Diagnosis Date Noted  . Neck pain on left side 01/20/2016  . Shoulder pain, left 11/26/2015  . Cutaneous wart 09/30/2014  . HTN (hypertension) 01/01/2013  . Hyperlipidemia with target LDL less than 100 05/04/2012  . IGT (impaired glucose tolerance) 03/14/2011  . GERD (gastroesophageal reflux disease) 03/14/2011  . BACK PAIN, LUMBAR, CHRONIC 06/25/2009  . Obesity 02/17/2007  . Hypothyroidism 06/14/2006  . DIVERTICULOSIS, COLON 03/01/2006  . HIATAL HERNIA WITH REFLUX, HX OF 03/01/2006   Guadelupe Sabin, OTR/L  [phone] 03/23/2017, 11:37 AM  [place],  [place], [address] Phone: [phone]   Fax:  [phone]  Name: Victoria Reyes MRN: [id number] Date of Birth: 1950/10/03

## 2017-03-25 ENCOUNTER — Ambulatory Visit (HOSPITAL_COMMUNITY): Payer: Medicare Other | Admitting: Occupational Therapy

## 2017-03-25 ENCOUNTER — Encounter (HOSPITAL_COMMUNITY): Payer: Self-pay | Admitting: Occupational Therapy

## 2017-03-25 ENCOUNTER — Other Ambulatory Visit: Payer: Self-pay

## 2017-03-25 DIAGNOSIS — M25512 Pain in left shoulder: Secondary | ICD-10-CM | POA: Diagnosis not present

## 2017-03-25 DIAGNOSIS — M25612 Stiffness of left shoulder, not elsewhere classified: Secondary | ICD-10-CM

## 2017-03-25 DIAGNOSIS — R29898 Other symptoms and signs involving the musculoskeletal system: Secondary | ICD-10-CM

## 2017-03-25 NOTE — Therapy (Signed)
[place], [place], [address] Phone: [phone]   Fax:  [phone]  Occupational Therapy Treatment  Patient Details  Name: Victoria Reyes MRN: [id number] Date of Birth: 05-01-1950 Referring Provider (Historical): Dr. Meredith Pel   Encounter Date: 03/25/2017  OT End of Session - 03/25/17 1115    Visit Number  25    Number of Visits  31    Date for OT Re-Evaluation  04/08/17    Authorization Type  Medicare A & B    Authorization Time Period  Before 29th visit    Authorization - Visit Number  25    Authorization - Number of Visits  29    OT Start Time  1031    OT Stop Time  1114    OT Time Calculation (min)  43 min    Activity Tolerance  Patient tolerated treatment well    Behavior During Therapy  [facility] for tasks assessed/performed       Past Medical History:  Diagnosis Date  . Anemia    as a  young woman  . Diverticulitis 2005   CT   . DJD (degenerative joint disease)   . GERD (gastroesophageal reflux disease)   . Headache(784.0)   . Hiatal hernia   . Hyperlipidemia   . Hypertension   . Hypothyroidism   . Knee pain, right   . Low back pain   . Neuropathy    peroneal nerve  . Obesity   . Pneumonia    as child  . Pre-diabetes   . S/P colonoscopy 2005   Dr. Tamala Julian: sigmoid diverticulosis  . Thyroid disease   . Tobacco abuse     Past Surgical History:  Procedure Laterality Date  . BACK SURGERY    . COLONOSCOPY  12/18/2010   Procedure: COLONOSCOPY;  Surgeon: Dorothyann Peng, MD;  [facility];  Service: Endoscopy;  Laterality: N/A;  10:15am  . cyst removal right wrist    . EYE SURGERY Bilateral    cataract surgery with lens implant  . TONSILLECTOMY    . TOTAL SHOULDER ARTHROPLASTY Left 12/21/2016   Procedure: TOTAL SHOULDER ARTHROPLASTY;  Surgeon: Meredith Pel, MD;  Location: [place];  Service: Orthopedics;  Laterality: Left;  . TUBAL LIGATION    . umbillical hernia       There were no vitals filed for this visit.  Subjective Assessment - 03/25/17 1031    Subjective   S: I'm not having any pain, it gets better every day.     Currently in Pain?  No/denies         [facility] OT Assessment - 03/25/17 1030      Assessment   Medical Diagnosis  Left TSA      Precautions   Precautions  Shoulder    Type of Shoulder Precautions  ROM and strengthening as tolerated: er to 45 when stretching    Precaution Comments  Progress with ROM first, strengthening second when appropriate               OT Treatments/Exercises (OP) - 03/25/17 1033      Exercises   Exercises  Shoulder      Shoulder Exercises: Supine   Protraction  PROM;5 reps;Strengthening;15 reps    Protraction Weight (lbs)  1    Horizontal ABduction  PROM;5 reps;Strengthening;15 reps    Horizontal ABduction Weight (lbs)  1    External Rotation  PROM;5 reps;Strengthening;15 reps  External Rotation Weight (lbs)  1    Internal Rotation  PROM;5 reps;Strengthening;15 reps    Internal Rotation Weight (lbs)  1    Flexion  PROM;5 reps;Strengthening;15 reps    Shoulder Flexion Weight (lbs)  1    ABduction  PROM;5 reps;Strengthening;15 reps    Shoulder ABduction Weight (lbs)  1      Shoulder Exercises: Standing   External Rotation  Theraband;15 reps    Theraband Level (Shoulder External Rotation)  Level 2 (Red)    Internal Rotation  Theraband;15 reps    Theraband Level (Shoulder Internal Rotation)  Level 2 (Red)    Extension  Theraband;15 reps    Theraband Level (Shoulder Extension)  Level 2 (Red)    Row  Theraband;15 reps    Theraband Level (Shoulder Row)  Level 2 (Red)    Retraction  Theraband;15 reps    Theraband Level (Shoulder Retraction)  Level 2 (Red)      Shoulder Exercises: ROM/Strengthening   UBE (Upper Arm Bike)  Level 1 2' forward 2' reverse    Over Head Lace  2'    Ball on Wall  1' flexion 1' abduction      Shoulder Exercises: Stretch   Corner Stretch  3 reps;10 seconds     Internal Rotation Stretch  3 reps 10 seconds    External Rotation Stretch  3 reps;10 seconds    Wall Stretch - Flexion  3 reps;10 seconds    Wall Stretch - ABduction  3 reps;10 seconds      Manual Therapy   Manual Therapy  Myofascial release    Manual therapy comments  completed separately from therapeutic exercise    Myofascial Release  Myofascial release to left upper arm, trapezius, and scapularis regions to decrease pain and fascial restrictions and increase joint range of motion.                OT Short Term Goals - 03/09/17 1437      OT SHORT TERM GOAL #1   Title  Pt will be provided with and educated on HEP.     Time  4    Period  Weeks    Status  On-going      OT SHORT TERM GOAL #2   Title  Pt will decrease LUE pain to 5/10 or less to improve ability to use LUE as assist during dressing tasks.     Time  4    Period  Weeks      OT SHORT TERM GOAL #3   Title  Pt will decrease fascial restrictions in LUE from max to moderate amounts to improve mobility in LUE during daily tasks.     Time  4    Period  Weeks      OT SHORT TERM GOAL #4   Title  Pt will improve P/ROM of LUE to [facility] to increase ability to complete ADL tasks at waist level.     Time  4    Period  Weeks    Status  Partially Met      OT SHORT TERM GOAL #5   Status  --        OT Long Term Goals - 01/14/17 1148      OT LONG TERM GOAL #1   Title  Pt will return to highest level of functioning and independence in B/ADL completion using LUE as non-dominant.     Time  8    Period  Weeks    Status  On-going      OT LONG TERM GOAL #2   Title  Pt will decrease LUE fascial restrictions from mod to minimal amounts or less to improve mobility required for functional reaching tasks.     Time  8    Period  Weeks    Status  On-going      OT LONG TERM GOAL #3   Title  Pt will decrease LUE pain to 3/10 or less to improve use of LUE as an assist during B/ADL completion.     Time  8    Period  Weeks     Status  On-going      OT LONG TERM GOAL #4   Title  Pt will increase LUE A/ROM to [facility] to improve ability to reach up and perform grooming tasks.     Time  8    Period  Weeks    Status  On-going      OT LONG TERM GOAL #5   Title  Pt will improve LUE strength to 4/5 to improve ability to perform work tasks using LUE as non-dominant.    Time  8    Period  Weeks    Status  On-going            Plan - 03/25/17 1115    Clinical Impression Statement  A: Added corner stretch, er stretch, and abduction wall stretch, ball on wall, and increased overhead lacing to 2'. Intermittent rest breaks for fatigue, pt reporting discomfort with end range stretches. Continued with shoulder exercises working on strengthening and activity tolerance required for B/IADLs.     Plan  P: Continue with shoulder stretches and add to HEP.     OT Home Exercise Plan  10/3: table slides; 10/5: scapular A/ROM    Consulted and Agree with Plan of Care  Patient       Patient will benefit from skilled therapeutic intervention in order to improve the following deficits and impairments:  Decreased scar mobility, Decreased strength, Decreased range of motion, Pain, Increased fascial restrictions  Visit Diagnosis: Acute pain of left shoulder  Other symptoms and signs involving the musculoskeletal system  Stiffness of left shoulder, not elsewhere classified    Problem List Patient Active Problem List   Diagnosis Date Noted  . Neck pain on left side 01/20/2016  . Shoulder pain, left 11/26/2015  . Cutaneous wart 09/30/2014  . HTN (hypertension) 01/01/2013  . Hyperlipidemia with target LDL less than 100 05/04/2012  . IGT (impaired glucose tolerance) 03/14/2011  . GERD (gastroesophageal reflux disease) 03/14/2011  . BACK PAIN, LUMBAR, CHRONIC 06/25/2009  . Obesity 02/17/2007  . Hypothyroidism 06/14/2006  . DIVERTICULOSIS, COLON 03/01/2006  . HIATAL HERNIA WITH REFLUX, HX OF 03/01/2006   Guadelupe Sabin, OTR/L   [phone] 03/25/2017, 11:19 AM  [place], [place], [address] Phone: [phone]   Fax:  [phone]  Name: DANICIA TERHAAR MRN: [id number] Date of Birth: 1950/08/30

## 2017-03-28 ENCOUNTER — Ambulatory Visit (HOSPITAL_COMMUNITY): Payer: Medicare Other

## 2017-03-28 ENCOUNTER — Encounter (HOSPITAL_COMMUNITY): Payer: Self-pay

## 2017-03-28 DIAGNOSIS — M25512 Pain in left shoulder: Secondary | ICD-10-CM

## 2017-03-28 DIAGNOSIS — R29898 Other symptoms and signs involving the musculoskeletal system: Secondary | ICD-10-CM

## 2017-03-28 DIAGNOSIS — M25612 Stiffness of left shoulder, not elsewhere classified: Secondary | ICD-10-CM

## 2017-03-28 NOTE — Therapy (Signed)
[place], [place], [address] Phone: [phone]   Fax:  [phone]  Occupational Therapy Treatment  Patient Details  Name: Victoria Reyes MRN: [id number] Date of Birth: 11/16/1950 Referring Provider (Historical): Dr. Meredith Pel   Encounter Date: 03/28/2017  OT End of Session - 03/28/17 1140    Visit Number  26    Number of Visits  31    Date for OT Re-Evaluation  04/08/17    Authorization Type  Medicare A & B    Authorization Time Period  Before 29th visit    Authorization - Visit Number  26    Authorization - Number of Visits  29    OT Start Time  1037    OT Stop Time  1115    OT Time Calculation (min)  38 min    Activity Tolerance  Patient tolerated treatment well    Behavior During Therapy  [facility] for tasks assessed/performed       Past Medical History:  Diagnosis Date  . Anemia    as a  young woman  . Diverticulitis 2005   CT   . DJD (degenerative joint disease)   . GERD (gastroesophageal reflux disease)   . Headache(784.0)   . Hiatal hernia   . Hyperlipidemia   . Hypertension   . Hypothyroidism   . Knee pain, right   . Low back pain   . Neuropathy    peroneal nerve  . Obesity   . Pneumonia    as child  . Pre-diabetes   . S/P colonoscopy 2005   Dr. Tamala Julian: sigmoid diverticulosis  . Thyroid disease   . Tobacco abuse     Past Surgical History:  Procedure Laterality Date  . BACK SURGERY    . COLONOSCOPY  12/18/2010   Procedure: COLONOSCOPY;  Surgeon: Dorothyann Peng, MD;  [facility];  Service: Endoscopy;  Laterality: N/A;  10:15am  . cyst removal right wrist    . EYE SURGERY Bilateral    cataract surgery with lens implant  . TONSILLECTOMY    . TOTAL SHOULDER ARTHROPLASTY Left 12/21/2016   Procedure: TOTAL SHOULDER ARTHROPLASTY;  Surgeon: Meredith Pel, MD;  Location: [place];  Service: Orthopedics;  Laterality: Left;  . TUBAL LIGATION    . umbillical hernia       There were no vitals filed for this visit.  Subjective Assessment - 03/28/17 1137    Subjective   S: I'm getting better.    Currently in Pain?  No/denies         [facility] OT Assessment - 03/28/17 1137      Assessment   Medical Diagnosis  Left TSA      Precautions   Precautions  Shoulder    Type of Shoulder Precautions  ROM and strengthening as tolerated: er to 45 when stretching    Precaution Comments  Progress with ROM first, strengthening second when appropriate               OT Treatments/Exercises (OP) - 03/28/17 1138      Exercises   Exercises  Shoulder      Shoulder Exercises: Supine   Protraction  PROM;5 reps;Strengthening;10 reps    Protraction Weight (lbs)  2    Horizontal ABduction  PROM;5 reps;Strengthening;10 reps    Horizontal ABduction Weight (lbs)  2    External Rotation  PROM;5 reps;Strengthening;10 reps    External Rotation Weight (lbs)  2  Internal Rotation  PROM;5 reps;Strengthening;10 reps    Internal Rotation Weight (lbs)  2    Flexion  PROM;5 reps;Strengthening;12 reps    Shoulder Flexion Weight (lbs)  2    ABduction  PROM;5 reps;Strengthening;15 reps    Shoulder ABduction Weight (lbs)  1      Shoulder Exercises: ROM/Strengthening   Over Head Lace  2'    Proximal Shoulder Strengthening, Supine  12X each, 1#,  no rest breaks    Ball on Wall  1' flexion 1' abduction      Shoulder Exercises: Stretch   Corner Stretch  3 reps;10 seconds doorway    Internal Rotation Stretch  3 reps 10 seconds; towel horizontal    Wall Stretch - Flexion  3 reps;10 seconds    Wall Stretch - ABduction  3 reps;10 seconds      Manual Therapy   Manual Therapy  Myofascial release    Manual therapy comments  completed separately from therapeutic exercise    Myofascial Release  Myofascial release to left upper arm, trapezius, and scapularis regions to decrease pain and fascial restrictions and increase joint range of motion.              OT Education  - 03/28/17 1140    Education provided  Yes    Education Details  shoulder stretches    Person(s) Educated  Patient    Methods  Explanation;Demonstration;Handout    Comprehension  Verbalized understanding;Returned demonstration       OT Short Term Goals - 03/09/17 1437      OT SHORT TERM GOAL #1   Title  Pt will be provided with and educated on HEP.     Time  4    Period  Weeks    Status  On-going      OT SHORT TERM GOAL #2   Title  Pt will decrease LUE pain to 5/10 or less to improve ability to use LUE as assist during dressing tasks.     Time  4    Period  Weeks      OT SHORT TERM GOAL #3   Title  Pt will decrease fascial restrictions in LUE from max to moderate amounts to improve mobility in LUE during daily tasks.     Time  4    Period  Weeks      OT SHORT TERM GOAL #4   Title  Pt will improve P/ROM of LUE to [facility] to increase ability to complete ADL tasks at waist level.     Time  4    Period  Weeks    Status  Partially Met      OT SHORT TERM GOAL #5   Status  --        OT Long Term Goals - 01/14/17 1148      OT LONG TERM GOAL #1   Title  Pt will return to highest level of functioning and independence in B/ADL completion using LUE as non-dominant.     Time  8    Period  Weeks    Status  On-going      OT LONG TERM GOAL #2   Title  Pt will decrease LUE fascial restrictions from mod to minimal amounts or less to improve mobility required for functional reaching tasks.     Time  8    Period  Weeks    Status  On-going      OT LONG TERM GOAL #3   Title  Pt  will decrease LUE pain to 3/10 or less to improve use of LUE as an assist during B/ADL completion.     Time  8    Period  Weeks    Status  On-going      OT LONG TERM GOAL #4   Title  Pt will increase LUE A/ROM to [facility] to improve ability to reach up and perform grooming tasks.     Time  8    Period  Weeks    Status  On-going      OT LONG TERM GOAL #5   Title  Pt will improve LUE strength to 4/5 to improve  ability to perform work tasks using LUE as non-dominant.    Time  8    Period  Weeks    Status  On-going            Plan - 03/28/17 1140    Clinical Impression Statement  A: Pt's HEP was updated with shoulder stretches. Increased to 2# handweight supine except for abduction. Pt has increased difficulty with ball on the wall this session. VC for form and technique during exercises.     Plan  P: Myofascial release PRN. Follow up on shoulder stretches. Resume missed exercises.        Patient will benefit from skilled therapeutic intervention in order to improve the following deficits and impairments:  Decreased scar mobility, Decreased strength, Decreased range of motion, Pain, Increased fascial restrictions  Visit Diagnosis: Acute pain of left shoulder  Other symptoms and signs involving the musculoskeletal system  Stiffness of left shoulder, not elsewhere classified    Problem List Patient Active Problem List   Diagnosis Date Noted  . Neck pain on left side 01/20/2016  . Shoulder pain, left 11/26/2015  . Cutaneous wart 09/30/2014  . HTN (hypertension) 01/01/2013  . Hyperlipidemia with target LDL less than 100 05/04/2012  . IGT (impaired glucose tolerance) 03/14/2011  . GERD (gastroesophageal reflux disease) 03/14/2011  . BACK PAIN, LUMBAR, CHRONIC 06/25/2009  . Obesity 02/17/2007  . Hypothyroidism 06/14/2006  . DIVERTICULOSIS, COLON 03/01/2006  . HIATAL HERNIA WITH REFLUX, HX OF 03/01/2006   Ailene Ravel, OTR/L,CBIS  [phone]  03/28/2017, 11:43 AM  [place] [address] [place], [place], [address] Phone: [phone]   Fax:  [phone]  Name: Victoria Reyes MRN: [id number] Date of Birth: Oct 08, 1950

## 2017-03-28 NOTE — Patient Instructions (Signed)
Complete the following exercises 2-3 times a day.  Doorway Stretch  Place each hand opposite each other on the doorway. (You can change where you feel the stretch by moving arms higher or lower.) Step through with one foot and bend front knee until a stretch is felt and hold. Step through with the opposite foot on the next rep. Hold for __10-15___ seconds. Repeat __2__times.      Internal Rotation Across Back  Grab the end of a towel with your affected side, palm facing backwards. Grab the towel with your unaffected side and pull your affected hand across your back until you feel a stretch in the front of your shoulder. If you feel pain, pull just to the pain, do not pull through the pain. Hold. Return your affected arm to your side. Try to keep your hand/arm close to your body during the entire movement.     Hold for 10-15 seconds. Complete 2 times.        Posterior Capsule Stretch *Have your bent so your fist is pointed up towards the ceiling. Arm is shoulder height.   Stand or sit, one arm across body so hand rests over opposite shoulder. Gently push on crossed elbow with other hand until stretch is felt in shoulder of crossed arm. Hold _10-15__ seconds.  Repeat _2__ times per session.    Wall Flexion  Slide your arm up the wall or door frame until a stretch is felt in your shoulder . Hold for 10-15 seconds. Complete 2 times     Shoulder Abduction Stretch  Stand side ways by a wall with affected up on wall. Gently step in toward wall to feel stretch. Hold for 10-15 seconds. Complete 2 times.

## 2017-03-30 ENCOUNTER — Encounter (HOSPITAL_COMMUNITY): Payer: Self-pay | Admitting: Occupational Therapy

## 2017-03-30 ENCOUNTER — Other Ambulatory Visit: Payer: Self-pay

## 2017-03-30 ENCOUNTER — Ambulatory Visit (HOSPITAL_COMMUNITY): Payer: Medicare Other | Admitting: Occupational Therapy

## 2017-03-30 DIAGNOSIS — M25612 Stiffness of left shoulder, not elsewhere classified: Secondary | ICD-10-CM | POA: Diagnosis not present

## 2017-03-30 DIAGNOSIS — M25512 Pain in left shoulder: Secondary | ICD-10-CM

## 2017-03-30 DIAGNOSIS — R29898 Other symptoms and signs involving the musculoskeletal system: Secondary | ICD-10-CM

## 2017-03-30 NOTE — Therapy (Signed)
[place], [place], [address] Phone: [phone]   Fax:  [phone]  Occupational Therapy Treatment  Patient Details  Name: Victoria Reyes MRN: [id number] Date of Birth: 04-09-1951 Referring Provider (Historical): Dr. Meredith Pel   Encounter Date: 03/30/2017  OT End of Session - 03/30/17 1022    Visit Number  27    Number of Visits  31    Date for OT Re-Evaluation  04/08/17    Authorization Type  Medicare A & B    Authorization Time Period  Before 29th visit    Authorization - Visit Number  27    Authorization - Number of Visits  29    OT Start Time  [phone]    OT Stop Time  1020    OT Time Calculation (min)  42 min    Activity Tolerance  Patient tolerated treatment well    Behavior During Therapy  [facility] for tasks assessed/performed       Past Medical History:  Diagnosis Date  . Anemia    as a  young woman  . Diverticulitis 2005   CT   . DJD (degenerative joint disease)   . GERD (gastroesophageal reflux disease)   . Headache(784.0)   . Hiatal hernia   . Hyperlipidemia   . Hypertension   . Hypothyroidism   . Knee pain, right   . Low back pain   . Neuropathy    peroneal nerve  . Obesity   . Pneumonia    as child  . Pre-diabetes   . S/P colonoscopy 2005   Dr. Tamala Julian: sigmoid diverticulosis  . Thyroid disease   . Tobacco abuse     Past Surgical History:  Procedure Laterality Date  . BACK SURGERY    . COLONOSCOPY  12/18/2010   Procedure: COLONOSCOPY;  Surgeon: Dorothyann Peng, MD;  [facility];  Service: Endoscopy;  Laterality: N/A;  10:15am  . cyst removal right wrist    . EYE SURGERY Bilateral    cataract surgery with lens implant  . TONSILLECTOMY    . TOTAL SHOULDER ARTHROPLASTY Left 12/21/2016   Procedure: TOTAL SHOULDER ARTHROPLASTY;  Surgeon: Meredith Pel, MD;  Location: [facility];  Service: Orthopedics;  Laterality: Left;  . TUBAL LIGATION    . umbillical hernia       There were no vitals filed for this visit.  Subjective Assessment - 03/30/17 0939    Subjective   S: Next time is my last visit.     Currently in Pain?  No/denies         [facility] OT Assessment - 03/30/17 0938      Assessment   Medical Diagnosis  Left TSA      Precautions   Precautions  Shoulder    Type of Shoulder Precautions  ROM and strengthening as tolerated: er to 45 when stretching    Precaution Comments  progress as tolerated               OT Treatments/Exercises (OP) - 03/30/17 0941      Exercises   Exercises  Shoulder      Shoulder Exercises: Supine   Protraction  PROM;5 reps;Strengthening;10 reps    Protraction Weight (lbs)  2    Horizontal ABduction  PROM;5 reps;Strengthening;10 reps    Horizontal ABduction Weight (lbs)  2    External Rotation  PROM;5 reps;Strengthening;10 reps    External Rotation Weight (lbs)  2  Internal Rotation  PROM;5 reps;Strengthening;10 reps    Internal Rotation Weight (lbs)  2    Flexion  PROM;5 reps;Strengthening;12 reps    Shoulder Flexion Weight (lbs)  2    ABduction  PROM;5 reps;Strengthening;15 reps    Shoulder ABduction Weight (lbs)  1      Shoulder Exercises: Seated   Protraction  Strengthening;15 reps    Protraction Weight (lbs)  1    Horizontal ABduction  Strengthening;10 reps    Horizontal ABduction Weight (lbs)  1    External Rotation  Strengthening;10 reps    External Rotation Weight (lbs)  1    Internal Rotation  Strengthening;10 reps    Internal Rotation Weight (lbs)  1    Flexion  Strengthening;10 reps    Flexion Weight (lbs)  1    Abduction  Strengthening;10 reps    ABduction Weight (lbs)  1      Shoulder Exercises: ROM/Strengthening   X to V Arms  10X with 1#    Proximal Shoulder Strengthening, Supine  12X each, 1#,  no rest breaks    Proximal Shoulder Strengthening, Seated  10X with 1# one rest break    Ball on Wall  1' flexion       Functional Reaching Activities   Mid Level  While  seated, patient completed resistive clothespin task to work on functional reaching. Focused on posture and technique during task. Patient able to place pins to 34 cm on vertical pole.        Manual Therapy   Manual Therapy  Myofascial release    Manual therapy comments  completed separately from therapeutic exercise    Myofascial Release  Myofascial release to left upper arm, trapezius, and scapularis regions to decrease pain and fascial restrictions and increase joint range of motion.                OT Short Term Goals - 03/09/17 1437      OT SHORT TERM GOAL #1   Title  Pt will be provided with and educated on HEP.     Time  4    Period  Weeks    Status  On-going      OT SHORT TERM GOAL #2   Title  Pt will decrease LUE pain to 5/10 or less to improve ability to use LUE as assist during dressing tasks.     Time  4    Period  Weeks      OT SHORT TERM GOAL #3   Title  Pt will decrease fascial restrictions in LUE from max to moderate amounts to improve mobility in LUE during daily tasks.     Time  4    Period  Weeks      OT SHORT TERM GOAL #4   Title  Pt will improve P/ROM of LUE to [facility] to increase ability to complete ADL tasks at waist level.     Time  4    Period  Weeks    Status  Partially Met      OT SHORT TERM GOAL #5   Status  --        OT Long Term Goals - 01/14/17 1148      OT LONG TERM GOAL #1   Title  Pt will return to highest level of functioning and independence in B/ADL completion using LUE as non-dominant.     Time  8    Period  Weeks    Status  On-going  OT LONG TERM GOAL #2   Title  Pt will decrease LUE fascial restrictions from mod to minimal amounts or less to improve mobility required for functional reaching tasks.     Time  8    Period  Weeks    Status  On-going      OT LONG TERM GOAL #3   Title  Pt will decrease LUE pain to 3/10 or less to improve use of LUE as an assist during B/ADL completion.     Time  8    Period  Weeks     Status  On-going      OT LONG TERM GOAL #4   Title  Pt will increase LUE A/ROM to [facility] to improve ability to reach up and perform grooming tasks.     Time  8    Period  Weeks    Status  On-going      OT LONG TERM GOAL #5   Title  Pt will improve LUE strength to 4/5 to improve ability to perform work tasks using LUE as non-dominant.    Time  8    Period  Weeks    Status  On-going            Plan - 03/30/17 1022    Clinical Impression Statement  A: Continued working on strengthening with 1# and 2# weights, resuming seated strengthening. Pt unable to complete ball on wall in abduction today due to pain. Verbal cuing throughout session for form and technique.     Plan  P: Reassessment, FOTO, update G-Code, discharge       Patient will benefit from skilled therapeutic intervention in order to improve the following deficits and impairments:  Decreased scar mobility, Decreased strength, Decreased range of motion, Pain, Increased fascial restrictions  Visit Diagnosis: Acute pain of left shoulder  Other symptoms and signs involving the musculoskeletal system  Stiffness of left shoulder, not elsewhere classified    Problem List Patient Active Problem List   Diagnosis Date Noted  . Neck pain on left side 01/20/2016  . Shoulder pain, left 11/26/2015  . Cutaneous wart 09/30/2014  . HTN (hypertension) 01/01/2013  . Hyperlipidemia with target LDL less than 100 05/04/2012  . IGT (impaired glucose tolerance) 03/14/2011  . GERD (gastroesophageal reflux disease) 03/14/2011  . BACK PAIN, LUMBAR, CHRONIC 06/25/2009  . Obesity 02/17/2007  . Hypothyroidism 06/14/2006  . DIVERTICULOSIS, COLON 03/01/2006  . HIATAL HERNIA WITH REFLUX, HX OF 03/01/2006   Guadelupe Sabin, OTR/L  [phone] 03/30/2017, 10:25 AM  [facility] [address] [place], [place], [address] Phone: [phone]   Fax:  [phone]  Name: Victoria Reyes MRN:  [id number] Date of Birth: 1951-02-21

## 2017-04-08 ENCOUNTER — Ambulatory Visit (HOSPITAL_COMMUNITY): Payer: Medicare Other | Admitting: Occupational Therapy

## 2017-04-08 ENCOUNTER — Encounter (HOSPITAL_COMMUNITY): Payer: Self-pay | Admitting: Occupational Therapy

## 2017-04-08 ENCOUNTER — Other Ambulatory Visit: Payer: Self-pay

## 2017-04-08 DIAGNOSIS — R29898 Other symptoms and signs involving the musculoskeletal system: Secondary | ICD-10-CM

## 2017-04-08 DIAGNOSIS — M25512 Pain in left shoulder: Secondary | ICD-10-CM

## 2017-04-08 DIAGNOSIS — M25612 Stiffness of left shoulder, not elsewhere classified: Secondary | ICD-10-CM | POA: Diagnosis not present

## 2017-04-08 NOTE — Therapy (Signed)
[place], [place], [address] Phone: [phone]   Fax:  [phone]  Occupational Therapy Treatment  Patient Details  Name: Victoria Reyes MRN: [id number] Date of Birth: 06-25-1950 Referring Provider (Historical): Dr. Meredith Pel   Encounter Date: 04/08/2017  OT End of Session - 04/08/17 1108    Visit Number  28    Number of Visits  31    Date for OT Re-Evaluation  04/08/17    Authorization Type  Medicare A & B    Authorization Time Period  Before 29th visit    Authorization - Visit Number  28    Authorization - Number of Visits  29    OT Start Time  1031    OT Stop Time  1107    OT Time Calculation (min)  36 min    Activity Tolerance  Patient tolerated treatment well    Behavior During Therapy  [facility] for tasks assessed/performed       Past Medical History:  Diagnosis Date  . Anemia    as a  young woman  . Diverticulitis 2005   CT   . DJD (degenerative joint disease)   . GERD (gastroesophageal reflux disease)   . Headache(784.0)   . Hiatal hernia   . Hyperlipidemia   . Hypertension   . Hypothyroidism   . Knee pain, right   . Low back pain   . Neuropathy    peroneal nerve  . Obesity   . Pneumonia    as child  . Pre-diabetes   . S/P colonoscopy 2005   Dr. Tamala Julian: sigmoid diverticulosis  . Thyroid disease   . Tobacco abuse     Past Surgical History:  Procedure Laterality Date  . BACK SURGERY    . COLONOSCOPY  12/18/2010   Procedure: COLONOSCOPY;  Surgeon: Dorothyann Peng, MD;  [facility];  Service: Endoscopy;  Laterality: N/A;  10:15am  . cyst removal right wrist    . EYE SURGERY Bilateral    cataract surgery with lens implant  . TONSILLECTOMY    . TOTAL SHOULDER ARTHROPLASTY Left 12/21/2016   Procedure: TOTAL SHOULDER ARTHROPLASTY;  Surgeon: Meredith Pel, MD;  Location: [facility];  Service: Orthopedics;  Laterality: Left;  . TUBAL LIGATION    . umbillical hernia       There were no vitals filed for this visit.  Subjective Assessment - 04/08/17 1031    Subjective   S: I can use this arm for most anything now.     Currently in Pain?  No/denies         [facility] OT Assessment - 04/08/17 1031      Assessment   Medical Diagnosis  Left TSA      Precautions   Precautions  Shoulder    Type of Shoulder Precautions  ROM and strengthening as tolerated: er to 45 when stretching    Precaution Comments  progress as tolerated      Observation/Other Assessments   Focus on Therapeutic Outcomes (FOTO)   64/100 (36% impairment)      Palpation   Palpation comment  Min fascial restrictions in left dorsal and volar forearm, upper arm, trapezius, and scapularis regions      AROM   Overall AROM Comments  Assessed seated, er/IR adducted    AROM Assessment Site  Shoulder    Right/Left Shoulder  Left    Left Shoulder Flexion  130 Degrees 120 previous  Left Shoulder ABduction  134 Degrees 99 previous    Left Shoulder Internal Rotation  90 Degrees same as previous    Left Shoulder External Rotation  64 Degrees 53 previous      PROM   Overall PROM Comments  Assessed supine, er/IR adducted    PROM Assessment Site  Shoulder    Right/Left Shoulder  Left    Left Shoulder Flexion  145 Degrees 135 previous    Left Shoulder ABduction  150 Degrees 130 previous    Left Shoulder Internal Rotation  90 Degrees same as previous    Left Shoulder External Rotation  68 Degrees 60 previous      Strength   Overall Strength Comments  Assessed seated, er/IR adducted    Strength Assessment Site  Shoulder    Right/Left Shoulder  Left    Left Shoulder Flexion  4/5 3/5 previous    Left Shoulder ABduction  3+/5 3-/5 previous    Left Shoulder Internal Rotation  4/5 4-/5 previous    Left Shoulder External Rotation  4-/5 3+/5 previous               OT Treatments/Exercises (OP) - 04/08/17 1049      Exercises   Exercises  Shoulder      Shoulder Exercises: Supine    Protraction  PROM;5 reps    Horizontal ABduction  PROM;5 reps    External Rotation  PROM;5 reps    Internal Rotation  PROM;5 reps    Flexion  PROM;5 reps    ABduction  PROM;5 reps      Shoulder Exercises: Seated   Protraction  Strengthening;15 reps    Protraction Weight (lbs)  1    Horizontal ABduction  Strengthening;10 reps    Horizontal ABduction Weight (lbs)  1    External Rotation  Strengthening;10 reps    External Rotation Weight (lbs)  1    Internal Rotation  Strengthening;10 reps    Internal Rotation Weight (lbs)  1    Flexion  Strengthening;10 reps    Flexion Weight (lbs)  1    Abduction  Strengthening;10 reps    ABduction Weight (lbs)  1      Shoulder Exercises: Standing   Extension  Theraband;15 reps    Theraband Level (Shoulder Extension)  Level 2 (Red)    Row  Theraband;15 reps    Theraband Level (Shoulder Row)  Level 2 (Red)    Retraction  Theraband;15 reps    Theraband Level (Shoulder Retraction)  Level 2 (Red)      Manual Therapy   Manual Therapy  Myofascial release    Manual therapy comments  completed separately from therapeutic exercise    Myofascial Release  Myofascial release to left upper arm, trapezius, and scapularis regions to decrease pain and fascial restrictions and increase joint range of motion.              OT Education - 04/08/17 1058    Education provided  Yes    Education Details  shoulder strengthening and red scapular theraband    Person(s) Educated  Patient    Methods  Explanation;Demonstration;Handout    Comprehension  Verbalized understanding;Returned demonstration       OT Short Term Goals - 04/08/17 1108      OT SHORT TERM GOAL #1   Title  Pt will be provided with and educated on HEP.     Time  4    Period  Weeks    Status  Achieved  OT SHORT TERM GOAL #2   Title  Pt will decrease LUE pain to 5/10 or less to improve ability to use LUE as assist during dressing tasks.     Time  4    Period  Weeks      OT SHORT  TERM GOAL #3   Title  Pt will decrease fascial restrictions in LUE from max to moderate amounts to improve mobility in LUE during daily tasks.     Time  4    Period  Weeks      OT SHORT TERM GOAL #4   Title  Pt will improve P/ROM of LUE to [facility] to increase ability to complete ADL tasks at waist level.     Time  4    Period  Weeks    Status  Achieved        OT Long Term Goals - 04-10-17 1109      OT LONG TERM GOAL #1   Title  Pt will return to highest level of functioning and independence in B/ADL completion using LUE as non-dominant.     Time  8    Period  Weeks    Status  Achieved      OT LONG TERM GOAL #2   Title  Pt will decrease LUE fascial restrictions from mod to minimal amounts or less to improve mobility required for functional reaching tasks.     Time  8    Period  Weeks    Status  Achieved      OT LONG TERM GOAL #3   Title  Pt will decrease LUE pain to 3/10 or less to improve use of LUE as an assist during B/ADL completion.     Time  8    Period  Weeks    Status  Achieved      OT LONG TERM GOAL #4   Title  Pt will increase LUE A/ROM to [facility] to improve ability to reach up and perform grooming tasks.     Time  8    Period  Weeks    Status  Achieved      OT LONG TERM GOAL #5   Title  Pt will improve LUE strength to 4/5 to improve ability to perform work tasks using LUE as non-dominant.    Time  8    Period  Weeks    Status  Partially Met            Plan - 2017/04/10 1109    Clinical Impression Statement  A: Reassessment completed this session, pt has met all STGs and 4/5 LTGs with remaining LTG partially met. Pt reports she is now able to complete all ADLs independently without pain. Pt continues to have strength deficits in LUE however is able to use functionally without difficulties. Pt educated on HEP for continued shoulder strengthening. Pt is agreeable to discharge.     Plan  P: Discharge pt    Consulted and Agree with Plan of Care  Patient        Patient will benefit from skilled therapeutic intervention in order to improve the following deficits and impairments:  Decreased scar mobility, Decreased strength, Decreased range of motion, Pain, Increased fascial restrictions  Visit Diagnosis: Acute pain of left shoulder  Other symptoms and signs involving the musculoskeletal system  Stiffness of left shoulder, not elsewhere classified  G-Codes - 2017/04/10 1111    Functional Assessment Tool Used (Outpatient only)  FOTO Score: 64/100 (36% impairment)  Functional Limitation  Carrying, moving and handling objects    Carrying, Moving and Handling Objects Goal Status [id number])  At least 20 percent but less than 40 percent impaired, limited or restricted    Carrying, Moving and Handling Objects Discharge Status [phone])  At least 20 percent but less than 40 percent impaired, limited or restricted       Problem List Patient Active Problem List   Diagnosis Date Noted  . Neck pain on left side 01/20/2016  . Shoulder pain, left 11/26/2015  . Cutaneous wart 09/30/2014  . HTN (hypertension) 01/01/2013  . Hyperlipidemia with target LDL less than 100 05/04/2012  . IGT (impaired glucose tolerance) 03/14/2011  . GERD (gastroesophageal reflux disease) 03/14/2011  . BACK PAIN, LUMBAR, CHRONIC 06/25/2009  . Obesity 02/17/2007  . Hypothyroidism 06/14/2006  . DIVERTICULOSIS, COLON 03/01/2006  . HIATAL HERNIA WITH REFLUX, HX OF 03/01/2006   Guadelupe Sabin, OTR/L  [phone] 04/08/2017, 11:12 AM  [facility] [address] [place], [place], [address] Phone: [phone]   Fax:  [phone]  Name: Victoria Reyes MRN: [id number] Date of Birth: [birth year]    OCCUPATIONAL THERAPY DISCHARGE SUMMARY  Visits from Start of Care: 28  Current functional level related to goals / functional outcomes: See above. Pt is at highest level of independence with B/IADL completion.    Remaining  deficits: Pt continues to have strength deficits in LUE and has decreased activity tolerance.    Education / Equipment: HEP for shoulder strengthening Plan: Patient agrees to discharge.  Patient goals were met. Patient is being discharged due to meeting the stated rehab goals.  ?????

## 2017-04-08 NOTE — Patient Instructions (Signed)
Repeat all exercises 10-15 times, 1-2 times per day, using a 1lb to 2lb weight.   1) Shoulder Protraction    Begin with elbows by your side, slowly "punch" straight out in front of you.      2) Shoulder Flexion  Supine:     Standing:         Begin with arms at your side with thumbs pointed up, slowly raise both arms up and forward towards overhead.               3) Horizontal abduction/adduction  Supine:   Standing:           Begin with arms straight out in front of you, bring out to the side in at "T" shape. Keep arms straight entire time.                 4) Internal & External Rotation    *No band* -Stand with elbows at the side and elbows bent 90 degrees. Move your forearms away from your body, then bring back inward toward the body.     5) Shoulder Abduction  Supine:     Standing:       Lying on your back begin with your arms flat on the table next to your side. Slowly move your arms out to the side so that they go overhead, in a jumping jack or snow angel movement.      (Home) Extension: Isometric / Bilateral Arm Retraction - Sitting   Facing anchor, hold hands and elbow at shoulder height, with elbow bent.  Pull arms back to squeeze shoulder blades together. Repeat 10-15 times. 1-3 times/day.   (Clinic) Extension / Flexion (Assist)   Face anchor, pull arms back, keeping elbow straight, and squeze shoulder blades together. Repeat 10-15 times. 1-3 times/day.   Copyright  VHI. All rights reserved.   (Home) Retraction: Row - Bilateral (Anchor)   Facing anchor, arms reaching forward, pull hands toward stomach, keeping elbows bent and at your sides and pinching shoulder blades together. Repeat 10-15 times. 1-3 times/day.   Copyright  VHI. All rights reserved.

## 2017-04-11 ENCOUNTER — Encounter (HOSPITAL_COMMUNITY): Payer: Medicare Other | Admitting: Specialist

## 2017-04-29 ENCOUNTER — Other Ambulatory Visit: Payer: Self-pay | Admitting: Family Medicine

## 2017-05-03 ENCOUNTER — Ambulatory Visit: Payer: Medicare Other | Admitting: Family Medicine

## 2017-05-05 ENCOUNTER — Encounter: Payer: Self-pay | Admitting: Family Medicine

## 2017-05-05 ENCOUNTER — Ambulatory Visit (INDEPENDENT_AMBULATORY_CARE_PROVIDER_SITE_OTHER): Payer: Medicare Other | Admitting: Family Medicine

## 2017-05-05 VITALS — BP 120/82 | HR 100 | Resp 16 | Ht 62.0 in | Wt 211.0 lb

## 2017-05-05 DIAGNOSIS — I10 Essential (primary) hypertension: Secondary | ICD-10-CM | POA: Diagnosis not present

## 2017-05-05 DIAGNOSIS — E785 Hyperlipidemia, unspecified: Secondary | ICD-10-CM | POA: Diagnosis not present

## 2017-05-05 DIAGNOSIS — Z6838 Body mass index (BMI) 38.0-38.9, adult: Secondary | ICD-10-CM

## 2017-05-05 DIAGNOSIS — E038 Other specified hypothyroidism: Secondary | ICD-10-CM

## 2017-05-05 NOTE — Patient Instructions (Addendum)
  Wellness with nurse end July  F/U with rectal and MD November, call if you need me sooner  It is important that you exercise regularly at least 30 minutes 5 times a week. If you develop chest pain, have severe difficulty breathing, or feel very tired, stop exercising immediately and seek medical attention   Change in food choice and weight loss  Fasting lipid, cmp and EGFr, TSH vit D and CBC in early July  Thank you  for choosing [facility]. We consider it a privelige to serve you.  Delivering excellent health care in a caring and  compassionate way is our goal.  Partnering with you,  so that together we can achieve this goal is our strategy.

## 2017-05-05 NOTE — Assessment & Plan Note (Signed)
Deteriorated. Patient re-educated about  the importance of commitment to a  minimum of 150 minutes of exercise per week.  The importance of healthy food choices with portion control discussed. Encouraged to start a food diary, count calories and to consider  joining a support group. Sample diet sheets offered. Goals set by the patient for the next several months.   Weight /BMI 05/05/2017 12/27/2016 12/21/2016  WEIGHT 211 lb 212 lb 4 oz 208 lb  HEIGHT 5\' 2"  5\' 2"  5' 1.5"  BMI 38.59 kg/m2 38.82 kg/m2 38.66 kg/m2

## 2017-05-05 NOTE — Assessment & Plan Note (Signed)
Hyperlipidemia:Low fat diet discussed and encouraged.   Lipid Panel  Lab Results  Component Value Date   CHOL 201 (H) 07/12/2016   HDL 73 07/12/2016   LDLCALC 109 (H) 07/12/2016   TRIG 93 07/12/2016   CHOLHDL 2.8 07/12/2016   Updated lab needed at/ before next visit.

## 2017-05-05 NOTE — Progress Notes (Signed)
   Victoria Reyes     MRN: [id number]      DOB: 05-Nov-1950   HPI Victoria Reyes is here for follow up and re-evaluation of chronic medical conditions, medication management and review of any available recent lab and radiology data.  Preventive health is updated, specifically  Cancer screening and Immunization.   Left shoulder has improved The PT denies any adverse reactions to current medications since the last visit.  Plans to lose weight   ROS Denies recent fever or chills. Denies sinus pressure, nasal congestion, ear pain or sore throat. Denies chest congestion, productive cough or wheezing. Denies chest pains, palpitations and leg swelling Denies abdominal pain, nausea, vomiting,diarrhea or constipation.   Denies dysuria, frequency, hesitancy or incontinence.  Denies headaches, seizures, numbness, or tingling. Denies depression, anxiety or insomnia. Denies skin break down or rash.   PE  BP 120/82   Pulse 100   Resp 16   Ht 5\' 2"  (1.575 m)   Wt 211 lb (95.7 kg)   SpO2 94%   BMI 38.59 kg/m   Patient alert and oriented and in no cardiopulmonary distress.  HEENT: No facial asymmetry, EOMI,   oropharynx pink and moist.  Neck supple no JVD, no mass.  Chest: Clear to auscultation bilaterally.  CVS: S1, S2 no murmurs, no S3.Regular rate.  ABD: Soft non tender.   Ext: No edema  MS: Adequate ROM spine, shoulders, hips and knees.  Skin: Intact, no ulcerations or rash noted.  Psych: Good eye contact, normal affect. Memory intact not anxious or depressed appearing.  CNS: CN 2-12 intact, power,  normal throughout.no focal deficits noted.   Assessment & Plan  HTN (hypertension) Controlled, no change in medication DASH diet and commitment to daily physical activity for a minimum of 30 minutes discussed and encouraged, as a part of hypertension management. The importance of attaining a healthy weight is also discussed.  BP/Weight 05/05/2017 12/27/2016 12/23/2016  12/21/2016 12/15/2016 12/17/7891 11/10/173  Systolic BP 102 585 277 - 824 235 361  Diastolic BP 82 68 54 - 76 84 74  Wt. (Lbs) 211 212.25 - 208 208.6 210.04 208  BMI 38.59 38.82 - 38.66 38.78 37.21 36.85       Hypothyroidism Controlled, no change in medication   Obesity Deteriorated. Patient re-educated about  the importance of commitment to a  minimum of 150 minutes of exercise per week.  The importance of healthy food choices with portion control discussed. Encouraged to start a food diary, count calories and to consider  joining a support group. Sample diet sheets offered. Goals set by the patient for the next several months.   Weight /BMI 05/05/2017 12/27/2016 12/21/2016  WEIGHT 211 lb 212 lb 4 oz 208 lb  HEIGHT 5\' 2"  5\' 2"  5' 1.5"  BMI 38.59 kg/m2 38.82 kg/m2 38.66 kg/m2      Hyperlipidemia with target LDL less than 100 Hyperlipidemia:Low fat diet discussed and encouraged.   Lipid Panel  Lab Results  Component Value Date   CHOL 201 (H) 07/12/2016   HDL 73 07/12/2016   LDLCALC 109 (H) 07/12/2016   TRIG 93 07/12/2016   CHOLHDL 2.8 07/12/2016   Updated lab needed at/ before next visit.

## 2017-05-05 NOTE — Assessment & Plan Note (Signed)
Controlled, no change in medication  

## 2017-05-05 NOTE — Assessment & Plan Note (Signed)
Controlled, no change in medication DASH diet and commitment to daily physical activity for a minimum of 30 minutes discussed and encouraged, as a part of hypertension management. The importance of attaining a healthy weight is also discussed.  BP/Weight 05/05/2017 12/27/2016 12/23/2016 12/21/2016 12/15/2016 10/13/5787 10/18/4782  Systolic BP 128 208 138 - 871 959 747  Diastolic BP 82 68 54 - 76 84 74  Wt. (Lbs) 211 212.25 - 208 208.6 210.04 208  BMI 38.59 38.82 - 38.66 38.78 37.21 36.85

## 2017-07-03 ENCOUNTER — Other Ambulatory Visit: Payer: Self-pay | Admitting: Family Medicine

## 2017-07-26 ENCOUNTER — Other Ambulatory Visit: Payer: Self-pay

## 2017-07-26 ENCOUNTER — Encounter: Payer: Self-pay | Admitting: Family Medicine

## 2017-07-26 ENCOUNTER — Other Ambulatory Visit (HOSPITAL_COMMUNITY)
Admission: RE | Admit: 2017-07-26 | Discharge: 2017-07-26 | Disposition: A | Discharge status: Home / Self Care | Payer: Medicare Other | Source: Other Acute Inpatient Hospital | Attending: Family Medicine | Admitting: Family Medicine

## 2017-07-26 ENCOUNTER — Ambulatory Visit (INDEPENDENT_AMBULATORY_CARE_PROVIDER_SITE_OTHER): Payer: Medicare Other | Admitting: Family Medicine

## 2017-07-26 ENCOUNTER — Other Ambulatory Visit: Payer: Self-pay | Admitting: Family Medicine

## 2017-07-26 VITALS — BP 112/68 | HR 77 | Temp 97.8°F | Resp 14 | Ht 61.5 in | Wt 209.0 lb

## 2017-07-26 DIAGNOSIS — E669 Obesity, unspecified: Secondary | ICD-10-CM | POA: Insufficient documentation

## 2017-07-26 DIAGNOSIS — I10 Essential (primary) hypertension: Secondary | ICD-10-CM

## 2017-07-26 DIAGNOSIS — Z882 Allergy status to sulfonamides status: Secondary | ICD-10-CM | POA: Diagnosis not present

## 2017-07-26 DIAGNOSIS — N3090 Cystitis, unspecified without hematuria: Secondary | ICD-10-CM

## 2017-07-26 DIAGNOSIS — K219 Gastro-esophageal reflux disease without esophagitis: Secondary | ICD-10-CM | POA: Diagnosis not present

## 2017-07-26 DIAGNOSIS — E559 Vitamin D deficiency, unspecified: Secondary | ICD-10-CM

## 2017-07-26 DIAGNOSIS — Z79899 Other long term (current) drug therapy: Secondary | ICD-10-CM | POA: Insufficient documentation

## 2017-07-26 DIAGNOSIS — Z6838 Body mass index (BMI) 38.0-38.9, adult: Secondary | ICD-10-CM | POA: Diagnosis not present

## 2017-07-26 DIAGNOSIS — Z79890 Hormone replacement therapy (postmenopausal): Secondary | ICD-10-CM | POA: Insufficient documentation

## 2017-07-26 DIAGNOSIS — Z885 Allergy status to narcotic agent status: Secondary | ICD-10-CM | POA: Insufficient documentation

## 2017-07-26 DIAGNOSIS — E785 Hyperlipidemia, unspecified: Secondary | ICD-10-CM | POA: Diagnosis not present

## 2017-07-26 DIAGNOSIS — E039 Hypothyroidism, unspecified: Secondary | ICD-10-CM | POA: Insufficient documentation

## 2017-07-26 LAB — POCT URINALYSIS DIPSTICK
Bilirubin, UA: NEGATIVE
Glucose, UA: NEGATIVE
KETONES UA: NEGATIVE
NITRITE UA: POSITIVE
PH UA: 5.5 (ref 5.0–8.0)
PROTEIN UA: NEGATIVE
Spec Grav, UA: 1.02 (ref 1.010–1.025)
UROBILINOGEN UA: 0.2 U/dL

## 2017-07-26 MED ORDER — VITAMIN D (ERGOCALCIFEROL) 50 MCG (2000 UT) PO CAPS: 1.0000 | ORAL_CAPSULE | Freq: Every day | ORAL | 3 refills | Status: DC

## 2017-07-26 MED ORDER — NITROFURANTOIN MONOHYD MACRO 100 MG PO CAPS: 100.0000 mg | ORAL_CAPSULE | Freq: Two times a day (BID) | ORAL | 0 refills | Status: DC

## 2017-07-26 NOTE — Addendum Note (Signed)
Addended by: Marion Downer A on: 07/26/2017 11:51 AM   Modules accepted: Orders

## 2017-07-26 NOTE — Patient Instructions (Signed)
Push fluids Take the antibiotic 2 x a day  We will call if the culture shows different infection

## 2017-07-26 NOTE — Progress Notes (Signed)
Chief Complaint  Patient presents with  . Urinary Tract Infection    hurts when she finishes urinating, urinary frequency  Patient is here for dysuria.  She is here for an acute visit. Usual PCP is Tula Nakayama MD. Patient states she has had urinary tract infections, but none for a while.  I note that she had an asymptomatic UTI in September 2018 noted during her preop lab testing.  She was treated with Macrobid.  She states at that time she felt well.  No trouble with this antibiotic.  She  is allergic to sulfa antibiotics.  The culture grew E. coli that was resistant to Cipro. She has no nausea or vomiting.  No fever or chills.  No flank pain. She has chronic intermittent low back pain from a failed back surgery. She has hypertension.  She agrees with me that she does not have hypertension.  She is on Norvasc 5 mg a day.  Her blood pressure is quite good at 112/60.  She thinks that her blood pressure is only elevated when she has pain, and the rest of time its normal.  She states that her blood pressure on Norvasc is "too low" and she sometimes feels lightheaded.  I advised her that if she feels that Norvasc is to high a dose, she can cut it in half and take a half a pill daily.  She needs to monitor her blood pressure.  She states she has a family member who is a Marine scientist who can take it for her daily.  She needs to follow-up with Dr. Moshe Cipro regarding her blood pressure medicines. While here she requests a refill of her vitamin D.  This is provided   Patient Active Problem List   Diagnosis Date Noted  . Cutaneous wart 09/30/2014  . HTN (hypertension) 01/01/2013  . Hyperlipidemia with target LDL less than 100 05/04/2012  . IGT (impaired glucose tolerance) 03/14/2011  . GERD (gastroesophageal reflux disease) 03/14/2011  . BACK PAIN, LUMBAR, CHRONIC 06/25/2009  . Obesity 02/17/2007  . Hypothyroidism 06/14/2006  . DIVERTICULOSIS, COLON 03/01/2006  . HIATAL HERNIA WITH REFLUX, HX OF  03/01/2006    Outpatient Encounter Medications as of 07/26/2017  Medication Sig  . amLODipine (NORVASC) 5 MG tablet TAKE 1 TABLET BY MOUTH EVERY DAY  . levothyroxine (SYNTHROID, LEVOTHROID) 100 MCG tablet TAKE 1 TABLET BY MOUTH EVERY DAY BEFORE BREAKFAST, EXCEPT SATURDAY AND SUNDAY ONLY TAKE 1/2 TABLET  . omeprazole (PRILOSEC) 40 MG capsule Take 1 capsule (40 mg total) daily by mouth.  . DUREZOL 0.05 % EMUL Place 1 drop into the left eye 2 (two) times daily.  . nitrofurantoin, macrocrystal-monohydrate, (MACROBID) 100 MG capsule Take 1 capsule (100 mg total) by mouth 2 (two) times daily.  . Vitamin D, Ergocalciferol, 2000 units CAPS Take 1 capsule by mouth daily.   No facility-administered encounter medications on file as of 07/26/2017.     Allergies  Allergen Reactions  . Hydrocodone Nausea Only    REACTION: Nausea  . Sulfa Antibiotics Itching    Review of Systems  Constitutional: Negative for activity change, appetite change, chills, fever and unexpected weight change.  HENT: Negative for congestion, dental problem, postnasal drip and rhinorrhea.   Eyes: Negative for redness and visual disturbance.  Respiratory: Negative for cough and shortness of breath.   Cardiovascular: Negative for chest pain, palpitations and leg swelling.  Gastrointestinal: Negative for abdominal pain, constipation and diarrhea.  Genitourinary: Positive for dysuria. Negative for difficulty urinating, flank pain,  frequency and hematuria.  Musculoskeletal: Positive for back pain. Negative for arthralgias.  Neurological: Positive for light-headedness. Negative for dizziness and headaches.       At times, with change in position  Psychiatric/Behavioral: Negative for dysphoric mood and sleep disturbance. The patient is not nervous/anxious.     Physical Exam  Constitutional: She is oriented to person, place, and time. She appears well-developed and well-nourished. No distress.  HENT:  Head: Normocephalic and  atraumatic.  Mouth/Throat: Oropharynx is clear and moist.  Eyes: Pupils are equal, round, and reactive to light. Conjunctivae are normal.  Neck: Normal range of motion. Neck supple. No thyromegaly present.  Cardiovascular: Normal rate, regular rhythm and normal heart sounds.  Pulmonary/Chest: Effort normal and breath sounds normal. No respiratory distress.  No CVA tenderness  Abdominal: Soft. Bowel sounds are normal.  Lymphadenopathy:    She has no cervical adenopathy.  Neurological: She is alert and oriented to person, place, and time.  Psychiatric: She has a normal mood and affect. Her behavior is normal. Thought content normal.   Dip urinalysis reveals blood, leukocytes, and a positive nitrite.  Will culture  BP 112/68   Pulse 77   Temp 97.8 F (36.6 C) (Oral)   Resp 14   Ht 5' 1.5" (1.562 m)   Wt 209 lb 0.6 oz (94.8 kg)   SpO2 97%   BMI 38.86 kg/m   @PHYSEXAM @  ASSESSMENT/PLAN:  1. Cystitis Macrobid 100 twice a day for 7 days - Urine Culture  2. Vitamin D deficiency Refill vitamin D 2000 IU daily  3. Essential hypertension Well-controlled.  As as discussed in HPI patient feels like she does not have blood pressure, but she was put on blood pressure medication because she came in with elevated blood pressure when she had a flare of her back pain.  I explained to her that pain can cause elevations in blood pressure, however, her doctor should monitor her blood pressure.  We usually only start people in medicines if they have consistently elevated blood pressure over time.  I did advise she can cut her pill in half, continue to monitor her blood pressure, and follow-up with Dr. Moshe Cipro.   Patient Instructions  Push fluids Take the antibiotic 2 x a day  We will call if the culture shows different infection   Raylene Everts, MD

## 2017-07-29 LAB — URINE CULTURE: Culture: >=100000 — AB

## 2017-09-21 ENCOUNTER — Telehealth: Payer: Self-pay | Admitting: Family Medicine

## 2017-09-21 DIAGNOSIS — E039 Hypothyroidism, unspecified: Secondary | ICD-10-CM

## 2017-09-21 DIAGNOSIS — R7302 Impaired glucose tolerance (oral): Secondary | ICD-10-CM

## 2017-09-21 DIAGNOSIS — E559 Vitamin D deficiency, unspecified: Secondary | ICD-10-CM

## 2017-09-21 DIAGNOSIS — E785 Hyperlipidemia, unspecified: Secondary | ICD-10-CM

## 2017-09-21 DIAGNOSIS — I10 Essential (primary) hypertension: Secondary | ICD-10-CM

## 2017-09-21 NOTE — Telephone Encounter (Signed)
Patient left voicemail requesting a call back. I called her back, she is requesting a lab order for her upcoming appt. [place] / [phone]

## 2017-09-21 NOTE — Telephone Encounter (Signed)
Labs have been ordered fasting.

## 2017-09-22 ENCOUNTER — Encounter (HOSPITAL_COMMUNITY): Payer: Self-pay

## 2017-09-22 ENCOUNTER — Ambulatory Visit (HOSPITAL_COMMUNITY)
Admission: RE | Admit: 2017-09-22 | Discharge: 2017-09-22 | Disposition: A | Discharge status: Home / Self Care | Payer: Medicare Other | Source: Ambulatory Visit | Attending: Family Medicine | Admitting: Family Medicine

## 2017-09-22 DIAGNOSIS — E559 Vitamin D deficiency, unspecified: Secondary | ICD-10-CM | POA: Diagnosis not present

## 2017-09-22 DIAGNOSIS — R7302 Impaired glucose tolerance (oral): Secondary | ICD-10-CM | POA: Diagnosis not present

## 2017-09-22 DIAGNOSIS — I10 Essential (primary) hypertension: Secondary | ICD-10-CM | POA: Diagnosis not present

## 2017-09-22 DIAGNOSIS — Z1231 Encounter for screening mammogram for malignant neoplasm of breast: Secondary | ICD-10-CM | POA: Diagnosis not present

## 2017-09-22 DIAGNOSIS — E785 Hyperlipidemia, unspecified: Secondary | ICD-10-CM | POA: Diagnosis not present

## 2017-09-23 LAB — CBC
HCT: 38.4 % (ref 35.0–45.0)
Hemoglobin: 12.6 g/dL (ref 11.7–15.5)
MCH: 26.8 pg — AB (ref 27.0–33.0)
MCHC: 32.8 g/dL (ref 32.0–36.0)
MCV: 81.5 fL (ref 80.0–100.0)
MPV: 12 fL (ref 7.5–12.5)
PLATELETS: 307 10*3/uL (ref 140–400)
RBC: 4.71 10*6/uL (ref 3.80–5.10)
RDW: 15.2 % — AB (ref 11.0–15.0)
WBC: 9.7 10*3/uL (ref 3.8–10.8)

## 2017-09-23 LAB — LIPID PANEL
CHOLESTEROL: 218 mg/dL — AB (ref <200)
HDL: 58 mg/dL (ref >50)
LDL CHOLESTEROL (CALC): 132 mg/dL — AB
Non-HDL Cholesterol (Calc): 160 mg/dL (calc) — ABNORMAL HIGH (ref <130)
Total CHOL/HDL Ratio: 3.8 (calc) (ref <5.0)
Triglycerides: 149 mg/dL (ref <150)

## 2017-09-23 LAB — COMPLETE METABOLIC PANEL WITH GFR
AG RATIO: 1.7 (calc) (ref 1.0–2.5)
ALT: 6 U/L (ref 6–29)
AST: 13 U/L (ref 10–35)
Albumin: 4.1 g/dL (ref 3.6–5.1)
Alkaline phosphatase (APISO): 117 U/L (ref 33–130)
BUN: 15 mg/dL (ref 7–25)
CHLORIDE: 106 mmol/L (ref 98–110)
CO2: 27 mmol/L (ref 20–32)
Calcium: 9.1 mg/dL (ref 8.6–10.4)
Creat: 0.73 mg/dL (ref 0.50–0.99)
GFR, EST AFRICAN AMERICAN: 99 mL/min/{1.73_m2} (ref >=60)
GFR, Est Non African American: 85 mL/min/{1.73_m2} (ref >=60)
Globulin: 2.4 g/dL (calc) (ref 1.9–3.7)
Glucose, Bld: 87 mg/dL (ref 65–99)
Potassium: 4.2 mmol/L (ref 3.5–5.3)
SODIUM: 141 mmol/L (ref 135–146)
TOTAL PROTEIN: 6.5 g/dL (ref 6.1–8.1)
Total Bilirubin: 0.5 mg/dL (ref 0.2–1.2)

## 2017-09-23 LAB — VITAMIN D 25 HYDROXY (VIT D DEFICIENCY, FRACTURES): Vit D, 25-Hydroxy: 28 ng/mL — ABNORMAL LOW (ref 30–100)

## 2017-09-23 LAB — HEMOGLOBIN A1C
HEMOGLOBIN A1C: 5.6 %{Hb} (ref <5.7)
Mean Plasma Glucose: 114 (calc)
eAG (mmol/L): 6.3 (calc)

## 2017-09-23 LAB — TSH: TSH: 4.48 mIU/L (ref 0.40–4.50)

## 2017-10-24 ENCOUNTER — Ambulatory Visit: Payer: Medicare Other | Admitting: Family Medicine

## 2017-10-25 ENCOUNTER — Encounter: Payer: Self-pay | Admitting: Family Medicine

## 2017-11-06 ENCOUNTER — Other Ambulatory Visit: Payer: Self-pay | Admitting: Family Medicine

## 2017-11-28 ENCOUNTER — Other Ambulatory Visit: Payer: Self-pay

## 2017-11-28 ENCOUNTER — Other Ambulatory Visit: Payer: Self-pay | Admitting: Family Medicine

## 2017-11-28 MED ORDER — OMEPRAZOLE 40 MG PO CPDR: 40.0000 mg | DELAYED_RELEASE_CAPSULE | Freq: Every day | ORAL | 1 refills | Status: DC

## 2017-12-05 DIAGNOSIS — Z961 Presence of intraocular lens: Secondary | ICD-10-CM | POA: Diagnosis not present

## 2017-12-15 ENCOUNTER — Telehealth: Payer: Self-pay | Admitting: Family Medicine

## 2017-12-15 ENCOUNTER — Other Ambulatory Visit: Payer: Self-pay | Admitting: Family Medicine

## 2017-12-15 MED ORDER — OSELTAMIVIR PHOSPHATE 75 MG PO CAPS: 75.0000 mg | ORAL_CAPSULE | Freq: Two times a day (BID) | ORAL | 0 refills | Status: DC

## 2017-12-15 NOTE — Telephone Encounter (Signed)
Sent let her know to take only if she has symptoms, she needs to get the flu vaccine

## 2017-12-15 NOTE — Telephone Encounter (Signed)
Requesting tamiflu since grandaughter was diagnosed with flu A and flu B

## 2017-12-15 NOTE — Telephone Encounter (Signed)
Grand-daughter has flu, her daughter called in to request a preventative medication so she does not catch this.

## 2017-12-15 NOTE — Telephone Encounter (Signed)
Done

## 2017-12-19 ENCOUNTER — Ambulatory Visit (INDEPENDENT_AMBULATORY_CARE_PROVIDER_SITE_OTHER): Payer: Medicare Other

## 2017-12-19 VITALS — BP 132/80 | HR 83 | Resp 16 | Ht 61.5 in | Wt 210.0 lb

## 2017-12-19 DIAGNOSIS — Z780 Asymptomatic menopausal state: Secondary | ICD-10-CM

## 2017-12-19 DIAGNOSIS — Z0000 Encounter for general adult medical examination without abnormal findings: Secondary | ICD-10-CM

## 2017-12-19 NOTE — Progress Notes (Signed)
Subjective:   Victoria Reyes is a 66 y.o. female who presents for Medicare Annual (Subsequent) preventive examination.  Review of Systems:  Cardiac Risk Factors include: advanced age (>48men, >88 women);dyslipidemia;hypertension;obesity (BMI >30kg/m2);smoking/ tobacco exposure     Objective:     Vitals: BP 132/80   Pulse 83   Resp 16   Ht 5' 1.5" (1.562 m)   Wt 210 lb (95.3 kg)   SpO2 97%   BMI 39.04 kg/m   Body mass index is 39.04 kg/m.  Advanced Directives 12/19/2017 01/12/2017 12/15/2016 10/18/2016 12/09/2015  Does Patient Have a Medical Advance Directive? No Yes Yes Yes Yes  Type of Advance Directive - Living will Living will Living will Living will  Does patient want to make changes to medical advance directive? Yes (ED - Information included in AVS) Yes (Inpatient - patient defers changing a medical advance directive at this time) No - Patient declined Yes (MAU/Ambulatory/Procedural Areas - Information given) -  Copy of [place] in Chart? - - - - No - copy requested    Tobacco Social History   Tobacco Use  Smoking Status Former Smoker  . Packs/day: 0.25  . Years: 45.00  . Pack years: 11.25  . Types: Cigarettes  . Start date: 09/30/1963  . Last attempt to quit: 08/15/2015  . Years since quitting: 2.3  Smokeless Tobacco Never Used     Counseling given: Not Answered   Clinical Intake:  Pre-visit preparation completed: Yes  Pain : No/denies pain Pain Score: 0-No pain     Nutritional Status: BMI > 30  Obese Diabetes: No  How often do you need to have someone help you when you read instructions, pamphlets, or other written materials from your doctor or pharmacy?: 1 - Never What is the last grade level you completed in school?: college   Interpreter Needed?: No  Information entered by :: Joye Wesenberg LPN   Past Medical History:  Diagnosis Date  . Anemia    as a  young woman  . Diverticulitis 2005   CT   . DJD (degenerative joint  disease)   . GERD (gastroesophageal reflux disease)   . Headache(784.0)   . Hiatal hernia   . Hyperlipidemia   . Hypertension   . Hypothyroidism   . Knee pain, right   . Low back pain   . Neuropathy    peroneal nerve  . Obesity   . Pneumonia    as child  . Pre-diabetes   . S/P colonoscopy 2005   Dr. Tamala Julian: sigmoid diverticulosis  . Thyroid disease   . Tobacco abuse    Past Surgical History:  Procedure Laterality Date  . BACK SURGERY    . COLONOSCOPY  12/18/2010   Procedure: COLONOSCOPY;  Surgeon: Dorothyann Peng, MD;  Location[facility];  Service: Endoscopy;  Laterality: N/A;  10:15am  . cyst removal right wrist    . EYE SURGERY Bilateral    cataract surgery with lens implant  . TONSILLECTOMY    . TOTAL SHOULDER ARTHROPLASTY Left 12/21/2016   Procedure: TOTAL SHOULDER ARTHROPLASTY;  Surgeon: Meredith Pel, MD;  Location: [facility];  Service: Orthopedics;  Laterality: Left;  . TUBAL LIGATION    . umbillical hernia     Family History  Problem Relation Age of Onset  . Heart attack Sister        pacemaker  . Kidney failure Sister        on dialysis  . Colon cancer  Neg Hx    Social History   Socioeconomic History  . Marital status: Widowed    Spouse name: Not on file  . Number of children: Not on file  . Years of education: 84  . Highest education level: Some college, no degree  Occupational History  . Not on file  Social Needs  . Financial resource strain: Somewhat hard  . Food insecurity:    Worry: Never true    Inability: Never true  . Transportation needs:    Medical: No    Non-medical: No  Tobacco Use  . Smoking status: Former Smoker    Packs/day: 0.25    Years: 45.00    Pack years: 11.25    Types: Cigarettes    Start date: 09/30/1963    Last attempt to quit: 08/15/2015    Years since quitting: 2.3  . Smokeless tobacco: Never Used  Substance and Sexual Activity  . Alcohol use: No    Alcohol/week: 0.0 standard drinks  . Drug use: No  . Sexual  activity: Not Currently    Birth control/protection: Post-menopausal  Lifestyle  . Physical activity:    Days per week: 3 days    Minutes per session: 30 min  . Stress: Only a little  Relationships  . Social connections:    Talks on phone: More than three times a week    Gets together: More than three times a week    Attends religious service: More than 4 times per year    Active member of club or organization: Yes    Attends meetings of clubs or organizations: More than 4 times per year    Relationship status: Widowed  Other Topics Concern  . Not on file  Social History Narrative  . Not on file    Outpatient Encounter Medications as of 12/19/2017  Medication Sig  . amLODipine (NORVASC) 5 MG tablet TAKE 1 TABLET BY MOUTH EVERY DAY  . levothyroxine (SYNTHROID, LEVOTHROID) 100 MCG tablet TAKE 1 TABLET BY MOUTH EVERY DAY BEFORE BREAKFAST, EXCEPT SATURDAY AND SUNDAY ONLY TAKE 1/2 TABLET  . omeprazole (PRILOSEC) 40 MG capsule Take 1 capsule (40 mg total) by mouth daily.  Marland Kitchen oseltamivir (TAMIFLU) 75 MG capsule Take 1 capsule (75 mg total) by mouth 2 (two) times daily.  . [DISCONTINUED] Vitamin D, Ergocalciferol, 2000 units CAPS Take 1 capsule by mouth daily.  . [DISCONTINUED] DUREZOL 0.05 % EMUL Place 1 drop into the left eye 2 (two) times daily.  . [DISCONTINUED] nitrofurantoin, macrocrystal-monohydrate, (MACROBID) 100 MG capsule Take 1 capsule (100 mg total) by mouth 2 (two) times daily.   No facility-administered encounter medications on file as of 12/19/2017.     Activities of Daily Living In your present state of health, do you have any difficulty performing the following activities: 12/19/2017  Hearing? N  Vision? N  Difficulty concentrating or making decisions? N  Walking or climbing stairs? Y  Dressing or bathing? N  Doing errands, shopping? N  Preparing Food and eating ? N  Using the Toilet? N  In the past six months, have you accidently leaked urine? N  Do you have problems  with loss of bowel control? N  Managing your Medications? N  Managing your Finances? N  Housekeeping or managing your Housekeeping? N  Some recent data might be hidden    Patient Care Team: Fayrene Helper, MD as PCP - General (Family Medicine) Gala Romney Cristopher Estimable, MD (Gastroenterology)    Assessment:   This is a routine  wellness examination for Sun.  Exercise Activities and Dietary recommendations Current Exercise Habits: Structured exercise class, Type of exercise: Other - see comments, Intensity: Mild, Exercise limited by: orthopedic condition(s)  Goals    . Increase water intake    . Reduce fat intake     Recommend decreasing the amount of fried/high fatty foods.        Fall Risk Fall Risk  12/19/2017 07/26/2017 10/18/2016 05/03/2016 07/09/2015  Falls in the past year? No No Yes No Yes  Number falls in past yr: - - 2 or more - 1  Injury with Fall? - - No - No  Follow up - - Falls evaluation completed;Education provided;Falls prevention discussed - -  Comment - - reveals patient accidentally stumbled - -   Is the patient's home free of loose throw rugs in walkways, pet beds, electrical cords, etc?   yes      Grab bars in the bathroom? yes      Handrails on the stairs?   yes      Adequate lighting?   yes  Timed Get Up and Go performed:   Depression Screen PHQ 2/9 Scores 12/19/2017 07/26/2017 10/18/2016 05/03/2016  PHQ - 2 Score 0 0 0 0  PHQ- 9 Score - - - -     Cognitive Function     6CIT Screen 12/19/2017 10/18/2016  What Year? 0 points 0 points  What month? 0 points 0 points  What time? 0 points 0 points  Count back from 20 0 points 0 points  Months in reverse 0 points 0 points  Repeat phrase 0 points 0 points  Total Score 0 0    There is no immunization history for the selected administration types on file for this patient.  Qualifies for Shingles Vaccine? Pt declined   Screening Tests Health Maintenance  Topic Date Due  . INFLUENZA VACCINE  01/05/2018  (Originally 11/10/2017)  . TETANUS/TDAP  12/20/2018 (Originally 10/05/1969)  . PNA vac Low Risk Adult (1 of 2 - PCV13) 12/20/2018 (Originally 10/06/2014)  . MAMMOGRAM  09/23/2019  . COLONOSCOPY  12/30/2020  . DEXA SCAN  Completed  . Hepatitis C Screening  Completed    Cancer Screenings: Lung: Low Dose CT Chest recommended if Age 68-80 years, 30 pack-year currently smoking OR have quit w/in 15years. Patient does qualify. Breast:  Up to date on Mammogram? Yes   Up to date of Bone Density/Dexa? Yes- scheduled  Colorectal:up to date   Additional Screenings:  Hepatitis C Screening:      Plan:     I have personally reviewed and noted the following in the patient's chart:   . Medical and social history . Use of alcohol, tobacco or illicit drugs  . Current medications and supplements . Functional ability and status . Nutritional status . Physical activity . Advanced directives . List of other physicians . Hospitalizations, surgeries, and ER visits in previous 12 months . Vitals . Screenings to include cognitive, depression, and falls . Referrals and appointments  In addition, I have reviewed and discussed with patient certain preventive protocols, quality metrics, and best practice recommendations. A written personalized care plan for preventive services as well as general preventive health recommendations were provided to patient.     Kate Sable, LPN, LPN  08/17/175

## 2017-12-19 NOTE — Patient Instructions (Signed)
Victoria Reyes , Thank you for taking time to come for your Medicare Wellness Visit. I appreciate your ongoing commitment to your health goals. Please review the following plan we discussed and let me know if I can assist you in the future.   Schedule bone density at checkout     Screening recommendations/referrals: Colonoscopy: up to date  Mammogram: up to date  Bone Density: needs to schedule  Recommended yearly ophthalmology/optometry visit for glaucoma screening and checkup Recommended yearly dental visit for hygiene and checkup  Vaccinations: Influenza vaccine: declined Pneumococcal vaccine: declined Tdap vaccine: declined  Shingles vaccine: declined     Advanced directives:  Given   Conditions/risks identified: done   Next appointment: scheduled    Preventive Care 68 Years and Older, Female Preventive care refers to lifestyle choices and visits with your health care provider that can promote health and wellness. What does preventive care include?  A yearly physical exam. This is also called an annual well check.  Dental exams once or twice a year.  Routine eye exams. Ask your health care provider how often you should have your eyes checked.  Personal lifestyle choices, including:  Daily care of your teeth and gums.  Regular physical activity.  Eating a healthy diet.  Avoiding tobacco and drug use.  Limiting alcohol use.  Practicing safe sex.  Taking low-dose aspirin every day.  Taking vitamin and mineral supplements as recommended by your health care provider. What happens during an annual well check? The services and screenings done by your health care provider during your annual well check will depend on your age, overall health, lifestyle risk factors, and family history of disease. Counseling  Your health care provider may ask you questions about your:  Alcohol use.  Tobacco use.  Drug use.  Emotional well-being.  Home and relationship  well-being.  Sexual activity.  Eating habits.  History of falls.  Memory and ability to understand (cognition).  Work and work Statistician.  Reproductive health. Screening  You may have the following tests or measurements:  Height, weight, and BMI.  Blood pressure.  Lipid and cholesterol levels. These may be checked every 5 years, or more frequently if you are over 53 years old.  Skin check.  Lung cancer screening. You may have this screening every year starting at age 68 if you have a 30-pack-year history of smoking and currently smoke or have quit within the past 15 years.  Fecal occult blood test (FOBT) of the stool. You may have this test every year starting at age 685.  Flexible sigmoidoscopy or colonoscopy. You may have a sigmoidoscopy every 5 years or a colonoscopy every 10 years starting at age 68.  Hepatitis C blood test.  Hepatitis B blood test.  Sexually transmitted disease (STD) testing.  Diabetes screening. This is done by checking your blood sugar (glucose) after you have not eaten for a while (fasting). You may have this done every 1-3 years.  Bone density scan. This is done to screen for osteoporosis. You may have this done starting at age 68.  Mammogram. This may be done every 1-2 years. Talk to your health care provider about how often you should have regular mammograms. Talk with your health care provider about your test results, treatment options, and if necessary, the need for more tests. Vaccines  Your health care provider may recommend certain vaccines, such as:  Influenza vaccine. This is recommended every year.  Tetanus, diphtheria, and acellular pertussis (Tdap, Td) vaccine. You may  need a Td booster every 10 years.  Zoster vaccine. You may need this after age 70.  Pneumococcal 13-valent conjugate (PCV13) vaccine. One dose is recommended after age 60.  Pneumococcal polysaccharide (PPSV23) vaccine. One dose is recommended after age  68. Talk to your health care provider about which screenings and vaccines you need and how often you need them. This information is not intended to replace advice given to you by your health care provider. Make sure you discuss any questions you have with your health care provider. Document Released: 04/25/2015 Document Revised: 12/17/2015 Document Reviewed: 01/28/2015 Elsevier Interactive Patient Education  2017 [place] Prevention in the Home Falls can cause injuries. They can happen to people of all ages. There are many things you can do to make your home safe and to help prevent falls. What can I do on the outside of my home?  Regularly fix the edges of walkways and driveways and fix any cracks.  Remove anything that might make you trip as you walk through a door, such as a raised step or threshold.  Trim any bushes or trees on the path to your home.  Use bright outdoor lighting.  Clear any walking paths of anything that might make someone trip, such as rocks or tools.  Regularly check to see if handrails are loose or broken. Make sure that both sides of any steps have handrails.  Any raised decks and porches should have guardrails on the edges.  Have any leaves, snow, or ice cleared regularly.  Use sand or salt on walking paths during winter.  Clean up any spills in your garage right away. This includes oil or grease spills. What can I do in the bathroom?  Use night lights.  Install grab bars by the toilet and in the tub and shower. Do not use towel bars as grab bars.  Use non-skid mats or decals in the tub or shower.  If you need to sit down in the shower, use a plastic, non-slip stool.  Keep the floor dry. Clean up any water that spills on the floor as soon as it happens.  Remove soap buildup in the tub or shower regularly.  Attach bath mats securely with double-sided non-slip rug tape.  Do not have throw rugs and other things on the floor that can make  you trip. What can I do in the bedroom?  Use night lights.  Make sure that you have a light by your bed that is easy to reach.  Do not use any sheets or blankets that are too big for your bed. They should not hang down onto the floor.  Have a firm chair that has side arms. You can use this for support while you get dressed.  Do not have throw rugs and other things on the floor that can make you trip. What can I do in the kitchen?  Clean up any spills right away.  Avoid walking on wet floors.  Keep items that you use a lot in easy-to-reach places.  If you need to reach something above you, use a strong step stool that has a grab bar.  Keep electrical cords out of the way.  Do not use floor polish or wax that makes floors slippery. If you must use wax, use non-skid floor wax.  Do not have throw rugs and other things on the floor that can make you trip. What can I do with my stairs?  Do not leave any items on  the stairs.  Make sure that there are handrails on both sides of the stairs and use them. Fix handrails that are broken or loose. Make sure that handrails are as long as the stairways.  Check any carpeting to make sure that it is firmly attached to the stairs. Fix any carpet that is loose or worn.  Avoid having throw rugs at the top or bottom of the stairs. If you do have throw rugs, attach them to the floor with carpet tape.  Make sure that you have a light switch at the top of the stairs and the bottom of the stairs. If you do not have them, ask someone to add them for you. What else can I do to help prevent falls?  Wear shoes that:  Do not have high heels.  Have rubber bottoms.  Are comfortable and fit you well.  Are closed at the toe. Do not wear sandals.  If you use a stepladder:  Make sure that it is fully opened. Do not climb a closed stepladder.  Make sure that both sides of the stepladder are locked into place.  Ask someone to hold it for you, if  possible.  Clearly mark and make sure that you can see:  Any grab bars or handrails.  First and last steps.  Where the edge of each step is.  Use tools that help you move around (mobility aids) if they are needed. These include:  Canes.  Walkers.  Scooters.  Crutches.  Turn on the lights when you go into a dark area. Replace any light bulbs as soon as they burn out.  Set up your furniture so you have a clear path. Avoid moving your furniture around.  If any of your floors are uneven, fix them.  If there are any pets around you, be aware of where they are.  Review your medicines with your doctor. Some medicines can make you feel dizzy. This can increase your chance of falling. Ask your doctor what other things that you can do to help prevent falls. This information is not intended to replace advice given to you by your health care provider. Make sure you discuss any questions you have with your health care provider. Document Released: 01/23/2009 Document Revised: 09/04/2015 Document Reviewed: 05/03/2014 Elsevier Interactive Patient Education  2017 [employer].

## 2017-12-28 ENCOUNTER — Ambulatory Visit (HOSPITAL_COMMUNITY)
Admission: RE | Admit: 2017-12-28 | Discharge: 2017-12-28 | Disposition: A | Discharge status: Home / Self Care | Payer: Medicare Other | Source: Ambulatory Visit | Attending: Family Medicine | Admitting: Family Medicine

## 2017-12-28 DIAGNOSIS — Z13820 Encounter for screening for osteoporosis: Secondary | ICD-10-CM | POA: Diagnosis not present

## 2017-12-28 DIAGNOSIS — M85832 Other specified disorders of bone density and structure, left forearm: Secondary | ICD-10-CM | POA: Diagnosis not present

## 2017-12-28 DIAGNOSIS — Z780 Asymptomatic menopausal state: Secondary | ICD-10-CM | POA: Insufficient documentation

## 2017-12-28 DIAGNOSIS — M8589 Other specified disorders of bone density and structure, multiple sites: Secondary | ICD-10-CM | POA: Diagnosis not present

## 2018-01-09 ENCOUNTER — Other Ambulatory Visit: Payer: Self-pay | Admitting: Family Medicine

## 2018-02-02 ENCOUNTER — Telehealth: Payer: Self-pay | Admitting: Family Medicine

## 2018-02-02 NOTE — Telephone Encounter (Signed)
Pt is calling complaining of a swimmy head, from inner ear infection, no ear ache, and her stomach is upset. Please, [phone].

## 2018-02-03 ENCOUNTER — Other Ambulatory Visit: Payer: Self-pay | Admitting: Family Medicine

## 2018-02-03 MED ORDER — ONDANSETRON HCL 4 MG PO TABS: 4.0000 mg | ORAL_TABLET | Freq: Three times a day (TID) | ORAL | 0 refills | Status: DC | PRN

## 2018-02-03 MED ORDER — MECLIZINE HCL 12.5 MG PO TABS: 12.5000 mg | ORAL_TABLET | Freq: Three times a day (TID) | ORAL | 0 refills | Status: DC | PRN

## 2018-02-03 NOTE — Telephone Encounter (Signed)
States she is just swimmy headed and its making her nauseated. Said she's had this before and you sent her something in for it. Wants it sent to [facility].

## 2018-02-03 NOTE — Telephone Encounter (Signed)
antivert and zofran are prescribed pls let her know

## 2018-02-06 NOTE — Telephone Encounter (Signed)
Pt aware.

## 2018-02-27 ENCOUNTER — Encounter: Payer: Self-pay | Admitting: Family Medicine

## 2018-02-27 ENCOUNTER — Ambulatory Visit (INDEPENDENT_AMBULATORY_CARE_PROVIDER_SITE_OTHER): Payer: Medicare Other | Admitting: Family Medicine

## 2018-02-27 VITALS — BP 130/78 | HR 80 | Resp 16 | Ht 62.0 in | Wt 211.0 lb

## 2018-02-27 DIAGNOSIS — R7302 Impaired glucose tolerance (oral): Secondary | ICD-10-CM | POA: Diagnosis not present

## 2018-02-27 DIAGNOSIS — Z1211 Encounter for screening for malignant neoplasm of colon: Secondary | ICD-10-CM | POA: Diagnosis not present

## 2018-02-27 DIAGNOSIS — I10 Essential (primary) hypertension: Secondary | ICD-10-CM | POA: Diagnosis not present

## 2018-02-27 DIAGNOSIS — E038 Other specified hypothyroidism: Secondary | ICD-10-CM

## 2018-02-27 DIAGNOSIS — E785 Hyperlipidemia, unspecified: Secondary | ICD-10-CM | POA: Diagnosis not present

## 2018-02-27 NOTE — Assessment & Plan Note (Signed)
Hyperlipidemia:Low fat diet discussed and encouraged.   Lipid Panel  Lab Results  Component Value Date   CHOL 218 (H) 09/22/2017   HDL 58 09/22/2017   LDLCALC 132 (H) 09/22/2017   TRIG 149 09/22/2017   CHOLHDL 3.8 09/22/2017   Updated lab needed , not at goal when last checked.

## 2018-02-27 NOTE — Assessment & Plan Note (Signed)
Unchanged Patient re-educated about  the importance of commitment to a  minimum of 150 minutes of exercise per week.  The importance of healthy food choices with portion control discussed. Encouraged to start a food diary, count calories and to consider  joining a support group. Sample diet sheets offered. Goals set by the patient for the next several months.   Weight /BMI 02/27/2018 12/19/2017 07/26/2017  WEIGHT 211 lb 210 lb 209 lb 0.6 oz  HEIGHT 5\' 2"  5' 1.5" 5' 1.5"  BMI 38.59 kg/m2 39.04 kg/m2 38.86 kg/m2

## 2018-02-27 NOTE — Assessment & Plan Note (Signed)
Patient educated about the importance of limiting  Carbohydrate intake , the need to commit to daily physical activity for a minimum of 30 minutes , and to commit weight loss. The fact that changes in all these areas will reduce or eliminate all together the development of diabetes is stressed.  Has been maintaining normal blood sugars when checked this year, which is good  Diabetic Labs Latest Ref Rng & Units 09/22/2017 12/15/2016 07/12/2016 07/12/2016 12/26/2015  HbA1c <5.7 % of total Hgb 5.6 5.6 - 5.5 5.6  Chol <200 mg/dL 218(H) - - 201(H) 164  HDL >50 mg/dL 58 - - 73 74  Calc LDL mg/dL (calc) 132(H) - - 109(H) 70  Triglycerides <150 mg/dL 149 - - 93 101  Creatinine 0.50 - 0.99 mg/dL 0.73 0.79 0.68 0.68 0.74   BP/Weight 02/27/2018 12/19/2017 07/26/2017 05/05/2017 12/27/2016 12/23/2016 5/57/3220  Systolic BP 254 270 623 762 831 517 -  Diastolic BP 78 80 68 82 68 54 -  Wt. (Lbs) 211 210 209.04 211 212.25 - 208  BMI 38.59 39.04 38.86 38.59 38.82 - 38.66   No flowsheet data found.

## 2018-02-27 NOTE — Progress Notes (Signed)
Victoria Reyes     MRN: [id number]      DOB: 1950-06-04   HPI Victoria Reyes is here for follow up and re-evaluation of chronic medical conditions, medication management and review of any available recent lab and radiology data.  Preventive health is updated, specifically  Cancer screening and Immunization.   Questions or concerns regarding consultations or procedures which the PT has had in the interim are  addressed. The PT denies any adverse reactions to current medications since the last visit.  C/o excessive weight, and the need to change food choice, she has started regular exercise  ROS Denies recent fever or chills. Denies sinus pressure, nasal congestion, ear pain or sore throat. Denies chest congestion, productive cough or wheezing. Denies chest pains, palpitations and leg swelling Denies abdominal pain, nausea, vomiting,diarrhea or constipation.   Denies dysuria, frequency, hesitancy or incontinence. Denies joint pain, swelling and limitation in mobility. Denies headaches, seizures, numbness, or tingling. Denies depression, anxiety or insomnia. Denies skin break down or rash.   PE  BP 130/78   Pulse 80   Resp 16   Ht 5\' 2"  (1.575 m)   Wt 211 lb (95.7 kg)   SpO2 96%   BMI 38.59 kg/m   Patient alert and oriented and in no cardiopulmonary distress.  HEENT: No facial asymmetry, EOMI,   oropharynx pink and moist.  Neck supple no JVD, no mass.  Chest: Clear to auscultation bilaterally.  CVS: S1, S2 no murmurs, no S3.Regular rate.  ABD: Soft non tender.   Ext: No edema  MS: Adequate ROM spine, shoulders, hips and knees.  Skin: Intact, no ulcerations or rash noted.  Psych: Good eye contact, normal affect. Memory intact not anxious or depressed appearing.  CNS: CN 2-12 intact, power,  normal throughout.no focal deficits noted.   Assessment & Plan  HTN (hypertension) Controlled, no change in medication DASH diet and commitment to daily physical activity  for a minimum of 30 minutes discussed and encouraged, as a part of hypertension management. The importance of attaining a healthy weight is also discussed.  BP/Weight 02/27/2018 12/19/2017 07/26/2017 05/05/2017 12/27/2016 12/23/2016 7/82/9562  Systolic BP 130 865 784 696 295 284 -  Diastolic BP 78 80 68 82 68 54 -  Wt. (Lbs) 211 210 209.04 211 212.25 - 208  BMI 38.59 39.04 38.86 38.59 38.82 - 38.66       Morbid obesity ([facility]) Unchanged Patient re-educated about  the importance of commitment to a  minimum of 150 minutes of exercise per week.  The importance of healthy food choices with portion control discussed. Encouraged to start a food diary, count calories and to consider  joining a support group. Sample diet sheets offered. Goals set by the patient for the next several months.   Weight /BMI 02/27/2018 12/19/2017 07/26/2017  WEIGHT 211 lb 210 lb 209 lb 0.6 oz  HEIGHT 5\' 2"  5' 1.5" 5' 1.5"  BMI 38.59 kg/m2 39.04 kg/m2 38.86 kg/m2      Hypothyroidism Updated lab needed controlled when last checked  Hyperlipidemia with target LDL less than 100 Hyperlipidemia:Low fat diet discussed and encouraged.   Lipid Panel  Lab Results  Component Value Date   CHOL 218 (H) 09/22/2017   HDL 58 09/22/2017   LDLCALC 132 (H) 09/22/2017   TRIG 149 09/22/2017   CHOLHDL 3.8 09/22/2017   Updated lab needed , not at goal when last checked.     IGT (impaired glucose tolerance) Patient educated about the importance of  limiting  Carbohydrate intake , the need to commit to daily physical activity for a minimum of 30 minutes , and to commit weight loss. The fact that changes in all these areas will reduce or eliminate all together the development of diabetes is stressed.  Has been maintaining normal blood sugars when checked this year, which is good  Diabetic Labs Latest Ref Rng & Units 09/22/2017 12/15/2016 07/12/2016 07/12/2016 12/26/2015  HbA1c <5.7 % of total Hgb 5.6 5.6 - 5.5 5.6  Chol <200 mg/dL  218(H) - - 201(H) 164  HDL >50 mg/dL 58 - - 73 74  Calc LDL mg/dL (calc) 132(H) - - 109(H) 70  Triglycerides <150 mg/dL 149 - - 93 101  Creatinine 0.50 - 0.99 mg/dL 0.73 0.79 0.68 0.68 0.74   BP/Weight 02/27/2018 12/19/2017 07/26/2017 05/05/2017 12/27/2016 12/23/2016 5/43/6067  Systolic BP 703 403 524 818 590 931 -  Diastolic BP 78 80 68 82 68 54 -  Wt. (Lbs) 211 210 209.04 211 212.25 - 208  BMI 38.59 39.04 38.86 38.59 38.82 - 38.66   No flowsheet data found.

## 2018-02-27 NOTE — Assessment & Plan Note (Signed)
Updated lab needed controlled when last checked

## 2018-02-27 NOTE — Patient Instructions (Addendum)
F/U in 5 months, call if you need me before  PLEASE reconsider all vaccines , you need them likwe the rest of [place]  Weight loss is vital for your health, you can do this, goal of 10 pounds   TSH, fasting lipid, cmp and EGFR today, cologuard will be arranged from office today  It is important that you exercise regularly at least 30 minutes 5 times a week. If you develop chest pain, have severe difficulty breathing, or feel very tired, stop exercising immediately and seek medical attention  Thank you  for choosing [facility]. We consider it a privelige to serve you.  Delivering excellent health care in a caring and  compassionate way is our goal.  Partnering with you,  so that together we can achieve this goal is our strategy.

## 2018-02-27 NOTE — Assessment & Plan Note (Signed)
Controlled, no change in medication DASH diet and commitment to daily physical activity for a minimum of 30 minutes discussed and encouraged, as a part of hypertension management. The importance of attaining a healthy weight is also discussed.  BP/Weight 02/27/2018 12/19/2017 07/26/2017 05/05/2017 12/27/2016 12/23/2016 10/31/5748  Systolic BP 518 335 825 189 842 103 -  Diastolic BP 78 80 68 82 68 54 -  Wt. (Lbs) 211 210 209.04 211 212.25 - 208  BMI 38.59 39.04 38.86 38.59 38.82 - 38.66

## 2018-02-28 DIAGNOSIS — E785 Hyperlipidemia, unspecified: Secondary | ICD-10-CM | POA: Diagnosis not present

## 2018-02-28 DIAGNOSIS — I10 Essential (primary) hypertension: Secondary | ICD-10-CM | POA: Diagnosis not present

## 2018-03-01 ENCOUNTER — Encounter: Payer: Self-pay | Admitting: Family Medicine

## 2018-03-01 ENCOUNTER — Other Ambulatory Visit: Payer: Self-pay | Admitting: Family Medicine

## 2018-03-01 DIAGNOSIS — E038 Other specified hypothyroidism: Secondary | ICD-10-CM

## 2018-03-01 DIAGNOSIS — E785 Hyperlipidemia, unspecified: Secondary | ICD-10-CM

## 2018-03-01 DIAGNOSIS — I10 Essential (primary) hypertension: Secondary | ICD-10-CM

## 2018-03-01 LAB — COMPLETE METABOLIC PANEL WITHOUT GFR
AG Ratio: 1.5 (calc) (ref 1.0–2.5)
ALT: 6 U/L (ref 6–29)
AST: 14 U/L (ref 10–35)
Albumin: 3.8 g/dL (ref 3.6–5.1)
Alkaline phosphatase (APISO): 119 U/L (ref 33–130)
BUN: 11 mg/dL (ref 7–25)
CO2: 24 mmol/L (ref 20–32)
Calcium: 8.9 mg/dL (ref 8.6–10.4)
Chloride: 107 mmol/L (ref 98–110)
Creat: 0.77 mg/dL (ref 0.50–0.99)
GFR, Est African American: 92 mL/min/1.73m2
GFR, Est Non African American: 79 mL/min/1.73m2
Globulin: 2.6 g/dL (ref 1.9–3.7)
Glucose, Bld: 88 mg/dL (ref 65–99)
Potassium: 3.8 mmol/L (ref 3.5–5.3)
Sodium: 142 mmol/L (ref 135–146)
Total Bilirubin: 0.7 mg/dL (ref 0.2–1.2)
Total Protein: 6.4 g/dL (ref 6.1–8.1)

## 2018-03-01 LAB — LIPID PANEL
CHOL/HDL RATIO: 3.9 (calc) (ref <5.0)
CHOLESTEROL: 227 mg/dL — AB (ref <200)
HDL: 58 mg/dL (ref >50)
LDL Cholesterol (Calc): 139 mg/dL (calc) — ABNORMAL HIGH
NON-HDL CHOLESTEROL (CALC): 169 mg/dL — AB (ref <130)
Triglycerides: 160 mg/dL — ABNORMAL HIGH (ref <150)

## 2018-03-01 LAB — TSH: TSH: 5.56 mIU/L — ABNORMAL HIGH (ref 0.40–4.50)

## 2018-03-01 MED ORDER — LEVOTHYROXINE SODIUM 100 MCG PO TABS: 100.0000 ug | ORAL_TABLET | Freq: Every day | ORAL | 3 refills | Status: DC

## 2018-03-01 NOTE — Telephone Encounter (Signed)
-----  Message from Fayrene Helper, MD sent at 03/01/2018  5:22 AM EST ----- Please mail lab order for fasting lipid, chem 7 and eGFR and TSH to be drawn in April, 2020 along with the result note I sent her. Please review the note with her also,due to  change in med dose

## 2018-03-01 NOTE — Progress Notes (Signed)
sy

## 2018-03-08 ENCOUNTER — Other Ambulatory Visit: Payer: Self-pay | Admitting: Family Medicine

## 2018-04-27 DIAGNOSIS — Z1211 Encounter for screening for malignant neoplasm of colon: Secondary | ICD-10-CM | POA: Diagnosis not present

## 2018-04-27 LAB — COLOGUARD: COLOGUARD: NEGATIVE

## 2018-07-05 IMAGING — DX DG SHOULDER 1V*L*
1 series · 1 of 1 positions shown · non-contrast
Comparison: Prior CT from 11/29/2016.

CLINICAL DATA: Follow-up examination status post left shoulder
arthroplasty.

EXAM:
LEFT SHOULDER - 1 VIEW

[shoulder]
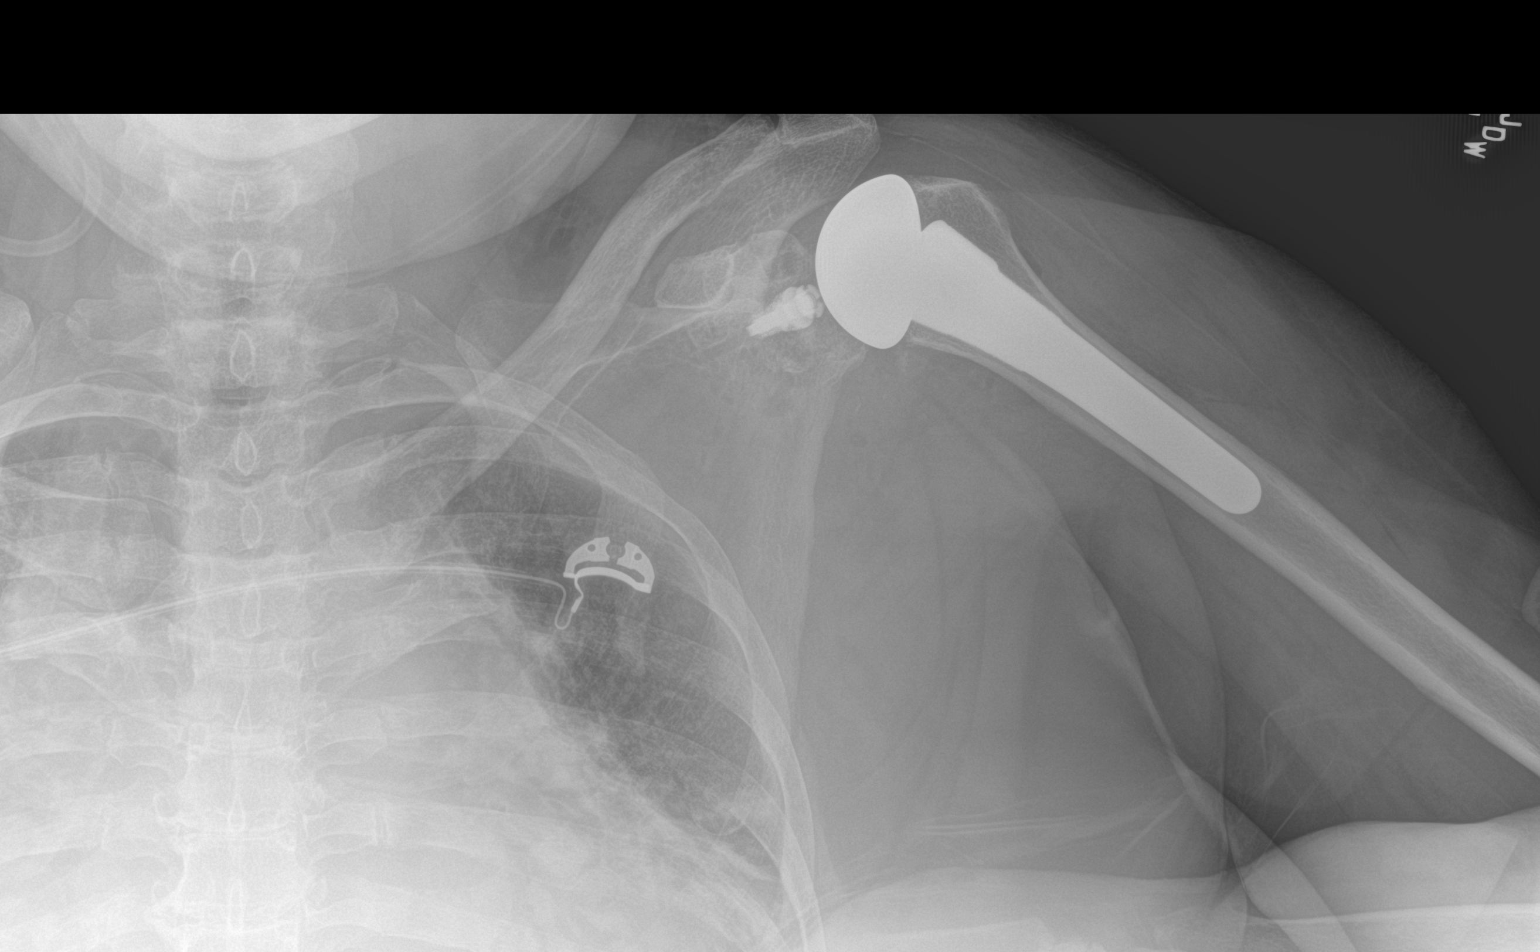

[1 of 1 positions shown; findings below may reference images not displayed]

FINDINGS: Postoperative changes from recent placement of a left total shoulder
arthroplasty. Prosthetic components appear well seated and
positioned. No periprosthetic fracture or other complication.
Postoperative soft tissue swelling and emphysema present about the
left shoulder. Atelectatic changes noted within the visualized lung.
IMPRESSION: Sequelae of recent left shoulder arthroplasty without complication.

## 2018-07-31 ENCOUNTER — Ambulatory Visit: Payer: Medicare Other | Admitting: Family Medicine

## 2018-08-02 ENCOUNTER — Ambulatory Visit (INDEPENDENT_AMBULATORY_CARE_PROVIDER_SITE_OTHER): Payer: Medicare HMO | Admitting: Family Medicine

## 2018-08-02 ENCOUNTER — Other Ambulatory Visit: Payer: Self-pay

## 2018-08-02 ENCOUNTER — Encounter: Payer: Self-pay | Admitting: Family Medicine

## 2018-08-02 VITALS — BP 130/70 | Ht 62.0 in | Wt 206.0 lb

## 2018-08-02 DIAGNOSIS — E785 Hyperlipidemia, unspecified: Secondary | ICD-10-CM | POA: Diagnosis not present

## 2018-08-02 DIAGNOSIS — Z1231 Encounter for screening mammogram for malignant neoplasm of breast: Secondary | ICD-10-CM | POA: Diagnosis not present

## 2018-08-02 DIAGNOSIS — K219 Gastro-esophageal reflux disease without esophagitis: Secondary | ICD-10-CM

## 2018-08-02 DIAGNOSIS — I10 Essential (primary) hypertension: Secondary | ICD-10-CM

## 2018-08-02 DIAGNOSIS — E038 Other specified hypothyroidism: Secondary | ICD-10-CM

## 2018-08-02 MED ORDER — LEVOTHYROXINE SODIUM 100 MCG PO TABS: 100.0000 ug | ORAL_TABLET | Freq: Every day | ORAL | 3 refills | Status: DC

## 2018-08-02 MED ORDER — AMLODIPINE BESYLATE 5 MG PO TABS: 5.0000 mg | ORAL_TABLET | Freq: Every day | ORAL | 3 refills | Status: DC

## 2018-08-02 MED ORDER — OMEPRAZOLE 40 MG PO CPDR: 40.0000 mg | DELAYED_RELEASE_CAPSULE | Freq: Every day | ORAL | 3 refills | Status: DC

## 2018-08-02 NOTE — Progress Notes (Signed)
Virtual Visit via Telephone Note  I connected with Victoria Reyes on 08/02/18 at  1:00 PM EDT by telephone and verified that I am speaking with the correct person using two identifiers.   I discussed the limitations, risks, security and privacy concerns of performing an evaluation and management service by telephone and the availability of in person appointments. I also discussed with the patient that there may be a patient responsible charge related to this service. The patient expressed understanding and agreed to proceed. I am in the office and patient is in her home   History of Present Illness: Review of Systems  Constitutional: Negative for chills and fever.  HENT: Negative for congestion, ear pain and sinus pain.   Eyes: Negative for discharge.  Respiratory: Negative for cough and sputum production.   Cardiovascular: Negative for chest pain, palpitations, leg swelling and PND.  Gastrointestinal: Positive for heartburn.       Controlled with daily PPI  Genitourinary: Negative for dysuria and frequency.  Musculoskeletal: Positive for joint pain.       Generalized osteoarthritis  Skin: Negative for rash.  Neurological: Negative for tremors, seizures and headaches.  Psychiatric/Behavioral: Negative for depression and suicidal ideas.      Observations/Objective: BP 130/70   Ht 5\' 2"  (1.575 m)   Wt 206 lb (93.4 kg)   BMI 37.68 kg/m    Assessment and Plan: HTN (hypertension) Controlled, no change in medication DASH diet and commitment to daily physical activity for a minimum of 30 minutes discussed and encouraged, as a part of hypertension management. The importance of attaining a healthy weight is also discussed.  BP/Weight 08/02/2018 02/27/2018 12/19/2017 07/26/2017 05/05/2017 12/27/2016 4/94/4967  Systolic BP 591 638 466 599 357 017 793  Diastolic BP 70 78 80 68 82 68 54  Wt. (Lbs) 206 211 210 209.04 211 212.25 -  BMI 37.68 38.59 39.04 38.86 38.59 38.82 -        Hyperlipidemia with target LDL less than 100 Hyperlipidemia:Low fat diet discussed and encouraged. Improved with change in diet, she is applauded on this and encouraged to continue same  Lipid Panel  Lab Results  Component Value Date   CHOL 191 08/04/2018   HDL 56 08/04/2018   LDLCALC 113 (H) 08/04/2018   TRIG 116 08/04/2018   CHOLHDL 3.4 08/04/2018       Hypothyroidism Controlled, no change in medication   Morbid obesity ([place]) Improved, obesity linked with hypertension and arthritis and thyroid disease  Patient re-educated about  the importance of commitment to a  minimum of 150 minutes of exercise per week as able.  The importance of healthy food choices with portion control discussed, as well as eating regularly and within a 12 hour window most days. The need to choose "clean , green" food 50 to 75% of the time is discussed, as well as to make water the primary drink and set a goal of 64 ounces water daily.  Encouraged to start a food diary,  and to consider  joining a support group. Sample diet sheets offered. Goals set by the patient for the next several months.   Weight /BMI 08/02/2018 02/27/2018 12/19/2017  WEIGHT 206 lb 211 lb 210 lb  HEIGHT 5\' 2"  5\' 2"  5' 1.5"  BMI 37.68 kg/m2 38.59 kg/m2 39.04 kg/m2      GERD (gastroesophageal reflux disease) Relies on daily PPI wih success in symptom control denies dysphagia.Reminded of the need to avoid caffeine     Follow Up Instructions:  I discussed the assessment and treatment plan with the patient. The patient was provided an opportunity to ask questions and all were answered. The patient agreed with the plan and demonstrated an understanding of the instructions.   The patient was advised to call back or seek an in-person evaluation if the symptoms worsen or if the condition fails to improve as anticipated.  I provided 22 minutes of non-face-to-face time during this encounter.   Tula Nakayama,  MD

## 2018-08-02 NOTE — Patient Instructions (Addendum)
Annual physical  with MD in 6.5 months, call if you need me before   Mammogram  scheduled , and appointment provided, also your current (new ) insurance information will be entered ( you will be called)  Please get fasting labs  in the next 1 to 2  Weeks   No medication changes, continue your responsible efforts at staying healthy  It is important that you exercise regularly at least 30 minutes 5 times a week. If you develop chest pain, have severe difficulty breathing, or feel very tired, stop exercising immediately and seek medical attention    Social distancing. Frequent hand washing with soap and water Keeping your hands off of your face.Wear a mask when outside of your home These 3 practices will help to keep both you and your community healthy during this time. Please practice them faithfully!

## 2018-08-04 DIAGNOSIS — E038 Other specified hypothyroidism: Secondary | ICD-10-CM | POA: Diagnosis not present

## 2018-08-04 DIAGNOSIS — E785 Hyperlipidemia, unspecified: Secondary | ICD-10-CM | POA: Diagnosis not present

## 2018-08-04 DIAGNOSIS — I10 Essential (primary) hypertension: Secondary | ICD-10-CM | POA: Diagnosis not present

## 2018-08-05 ENCOUNTER — Encounter: Payer: Self-pay | Admitting: Family Medicine

## 2018-08-05 LAB — BASIC METABOLIC PANEL WITH GFR
BUN: 9 mg/dL (ref 7–25)
CALCIUM: 9 mg/dL (ref 8.6–10.4)
CO2: 27 mmol/L (ref 20–32)
CREATININE: 0.75 mg/dL (ref 0.50–0.99)
Chloride: 107 mmol/L (ref 98–110)
GFR, Est African American: 95 mL/min/{1.73_m2} (ref >=60)
GFR, Est Non African American: 82 mL/min/{1.73_m2} (ref >=60)
GLUCOSE: 81 mg/dL (ref 65–99)
Potassium: 3.8 mmol/L (ref 3.5–5.3)
Sodium: 143 mmol/L (ref 135–146)

## 2018-08-05 LAB — TSH: TSH: 0.51 m[IU]/L (ref 0.40–4.50)

## 2018-08-05 LAB — LIPID PANEL
CHOLESTEROL: 191 mg/dL (ref <200)
HDL: 56 mg/dL (ref >=50)
LDL Cholesterol (Calc): 113 mg/dL (calc) — ABNORMAL HIGH
Non-HDL Cholesterol (Calc): 135 mg/dL (calc) — ABNORMAL HIGH (ref <130)
Total CHOL/HDL Ratio: 3.4 (calc) (ref <5.0)
Triglycerides: 116 mg/dL (ref <150)

## 2018-08-05 NOTE — Assessment & Plan Note (Signed)
Controlled, no change in medication  

## 2018-08-05 NOTE — Assessment & Plan Note (Signed)
Hyperlipidemia:Low fat diet discussed and encouraged. Improved with change in diet, she is applauded on this and encouraged to continue same  Lipid Panel  Lab Results  Component Value Date   CHOL 191 08/04/2018   HDL 56 08/04/2018   LDLCALC 113 (H) 08/04/2018   TRIG 116 08/04/2018   CHOLHDL 3.4 08/04/2018

## 2018-08-05 NOTE — Assessment & Plan Note (Signed)
Improved, obesity linked with hypertension and arthritis and thyroid disease  Patient re-educated about  the importance of commitment to a  minimum of 150 minutes of exercise per week as able.  The importance of healthy food choices with portion control discussed, as well as eating regularly and within a 12 hour window most days. The need to choose "clean , green" food 50 to 75% of the time is discussed, as well as to make water the primary drink and set a goal of 64 ounces water daily.  Encouraged to start a food diary,  and to consider  joining a support group. Sample diet sheets offered. Goals set by the patient for the next several months.   Weight /BMI 08/02/2018 02/27/2018 12/19/2017  WEIGHT 206 lb 211 lb 210 lb  HEIGHT 5\' 2"  5\' 2"  5' 1.5"  BMI 37.68 kg/m2 38.59 kg/m2 39.04 kg/m2

## 2018-08-05 NOTE — Assessment & Plan Note (Signed)
Controlled, no change in medication DASH diet and commitment to daily physical activity for a minimum of 30 minutes discussed and encouraged, as a part of hypertension management. The importance of attaining a healthy weight is also discussed.  BP/Weight 08/02/2018 02/27/2018 12/19/2017 07/26/2017 05/05/2017 12/27/2016 08/14/6977  Systolic BP 480 165 537 482 [phone]  Diastolic BP 70 78 80 68 82 68 54  Wt. (Lbs) 206 211 210 209.04 211 212.25 -  BMI 37.68 38.59 39.04 38.86 38.59 38.82 -

## 2018-08-05 NOTE — Assessment & Plan Note (Signed)
Relies on daily PPI wih success in symptom control denies dysphagia.Reminded of the need to avoid caffeine

## 2018-08-07 ENCOUNTER — Encounter: Payer: Self-pay | Admitting: *Deleted

## 2018-11-10 ENCOUNTER — Other Ambulatory Visit: Payer: Self-pay

## 2018-11-10 ENCOUNTER — Ambulatory Visit (HOSPITAL_COMMUNITY)
Admission: RE | Admit: 2018-11-10 | Discharge: 2018-11-10 | Disposition: A | Discharge status: Home / Self Care | Payer: Medicare HMO | Source: Ambulatory Visit | Attending: Family Medicine | Admitting: Family Medicine

## 2018-11-10 DIAGNOSIS — Z1231 Encounter for screening mammogram for malignant neoplasm of breast: Secondary | ICD-10-CM | POA: Diagnosis not present

## 2018-11-22 ENCOUNTER — Ambulatory Visit (INDEPENDENT_AMBULATORY_CARE_PROVIDER_SITE_OTHER): Payer: Medicare HMO | Admitting: Orthopedic Surgery

## 2018-11-22 ENCOUNTER — Other Ambulatory Visit: Payer: Self-pay

## 2018-11-22 ENCOUNTER — Ambulatory Visit: Payer: Medicare HMO

## 2018-11-22 ENCOUNTER — Encounter: Payer: Self-pay | Admitting: Orthopedic Surgery

## 2018-11-22 VITALS — BP 130/60 | HR 75 | Temp 98.4°F | Ht 62.0 in | Wt 206.0 lb

## 2018-11-22 DIAGNOSIS — M25561 Pain in right knee: Secondary | ICD-10-CM

## 2018-11-22 DIAGNOSIS — M25511 Pain in right shoulder: Secondary | ICD-10-CM

## 2018-11-22 NOTE — Patient Instructions (Signed)
Use Tylenol or ibuprofen for pain  It is okay to use ice or heat for discomfort  Ease back into normal activities  I did not see any fracture

## 2018-11-22 NOTE — Progress Notes (Signed)
Victoria Reyes  11/22/2018  HISTORY SECTION :  Chief Complaint  Patient presents with  . Shoulder Pain    right   . Knee Pain    right since fall 11/18/2018   HPI The patient presents for evaluation after falling injuring her right side  She complains of right shoulder pain and right knee pain since falling on August 8  She has pain in the front of the right shoulder and over the acromion pain when she elevates the arm  She has pain over the right shin bone and medial joint line of the right knee.  She is able to walk.  She has some mild discomfort when she walks.  She did rest her's knee put some ice and heat on it  She is here for evaluation and treatment.  The quality of her pain is a dull ache and the severity is mild  Review of Systems  Cardiovascular: Positive for leg swelling.  Musculoskeletal: Positive for back pain.     Past Medical History:  Diagnosis Date  . Anemia    as a  young woman  . Diverticulitis 2005   CT   . DJD (degenerative joint disease)   . GERD (gastroesophageal reflux disease)   . Headache(784.0)   . Hiatal hernia   . Hyperlipidemia   . Hypertension   . Hypothyroidism   . Knee pain, right   . Low back pain   . Neuropathy    peroneal nerve  . Obesity   . Pneumonia    as child  . Pre-diabetes   . S/P colonoscopy 2005   Dr. Tamala Julian: sigmoid diverticulosis  . Thyroid disease   . Tobacco abuse     Past Surgical History:  Procedure Laterality Date  . BACK SURGERY    . COLONOSCOPY  12/18/2010   Procedure: COLONOSCOPY;  Surgeon: Dorothyann Peng, MD;  [facility];  Service: Endoscopy;  Laterality: N/A;  10:15am  . cyst removal right wrist    . EYE SURGERY Bilateral    cataract surgery with lens implant  . TONSILLECTOMY    . TOTAL SHOULDER ARTHROPLASTY Left 12/21/2016   Procedure: TOTAL SHOULDER ARTHROPLASTY;  Surgeon: Meredith Pel, MD;  Location: [facility];  Service: Orthopedics;  Laterality: Left;  . TUBAL LIGATION     . umbillical hernia       Allergies  Allergen Reactions  . Hydrocodone Nausea Only    REACTION: Nausea  . Sulfa Antibiotics Itching     Current Outpatient Medications:  .  amLODipine (NORVASC) 5 MG tablet, Take 1 tablet (5 mg total) by mouth daily., Disp: 90 tablet, Rfl: 3 .  levothyroxine (SYNTHROID) 100 MCG tablet, Take 1 tablet (100 mcg total) by mouth daily., Disp: 90 tablet, Rfl: 3 .  omeprazole (PRILOSEC) 40 MG capsule, Take 1 capsule (40 mg total) by mouth daily., Disp: 90 capsule, Rfl: 3   PHYSICAL EXAM SECTION: 1) BP 130/60   Pulse 75   Ht 5\' 2"  (1.575 m)   Wt 206 lb (93.4 kg)   BMI 37.68 kg/m   Body mass index is 37.68 kg/m. General appearance: Well-developed well-nourished no gross deformities  2) Cardiovascular normal pulse and perfusion the extremities with normal color and no edema  3) Neurologically deep tendon reflexes are equal and normal, no sensation loss or deficits no pathologic reflexes  4) Psychological: Awake alert and oriented x3 mood and affect normal  5) Skin no lacerations or ulcerations no nodularity no palpable  masses, no erythema or nodularity  6) Musculoskeletal:   Right shoulder tender over the [facility] joint active range of motion 150 degrees of flexion she has mild crepitance on range of motion rotator cuff strength is normal  Right knee there is a large bruise over the shin and proximal tibia with tenderness along the medial joint line and some crepitance knee feels stable and she has normal range of motion  Her gait seems normal  MEDICAL DECISION SECTION:  Encounter Diagnoses  Name Primary?  . Acute pain of right knee Yes  . Acute pain of right shoulder     Imaging Right shoulder x-ray was normal  Right knee x-ray was normal  Reports have been submitted  Plan:  (Rx., Inj., surg., Frx, MRI/CT, XR:2)  Tylenol or ibuprofen over-the-counter ease back into normal activity follow-up as needed  11:45 AM Arther Abbott,  MD  11/22/2018

## 2018-12-21 ENCOUNTER — Ambulatory Visit: Payer: Medicare Other

## 2018-12-22 ENCOUNTER — Ambulatory Visit (INDEPENDENT_AMBULATORY_CARE_PROVIDER_SITE_OTHER): Payer: Medicare HMO | Admitting: Family Medicine

## 2018-12-22 ENCOUNTER — Encounter: Payer: Self-pay | Admitting: Family Medicine

## 2018-12-22 ENCOUNTER — Other Ambulatory Visit: Payer: Self-pay

## 2018-12-22 VITALS — BP 130/60 | HR 75 | Resp 15 | Ht 62.0 in | Wt 203.0 lb

## 2018-12-22 DIAGNOSIS — Z0000 Encounter for general adult medical examination without abnormal findings: Secondary | ICD-10-CM

## 2018-12-22 NOTE — Progress Notes (Signed)
Subjective:   Victoria Reyes is a 68 y.o. female who presents for Medicare Annual (Subsequent) preventive examination.  Location of Patient: Home Location of Provider: Telehealth Consent was obtain for visit to be over via telehealth. I verified that I am speaking with the correct person using two identifiers.   Review of Systems:    Cardiac Risk Factors include: advanced age (>81men, >45 women);hypertension;dyslipidemia;obesity (BMI >30kg/m2)     Objective:     Vitals: BP 130/60   Pulse 75   Resp 15   Ht 5\' 2"  (1.575 m)   Wt 203 lb (92.1 kg)   BMI 37.13 kg/m   Body mass index is 37.13 kg/m.  Advanced Directives 12/19/2017 01/12/2017 12/15/2016 10/18/2016 12/09/2015  Does Patient Have a Medical Advance Directive? No Yes Yes Yes Yes  Type of Advance Directive - Living will Living will Living will Living will  Does patient want to make changes to medical advance directive? Yes (ED - Information included in AVS) Yes (Inpatient - patient defers changing a medical advance directive at this time) No - Patient declined Yes (MAU/Ambulatory/Procedural Areas - Information given) -  Copy of [place] in Chart? - - - - No - copy requested    Tobacco Social History   Tobacco Use  Smoking Status Former Smoker  . Packs/day: 0.25  . Years: 45.00  . Pack years: 11.25  . Types: Cigarettes  . Start date: 09/30/1963  . Quit date: 08/15/2015  . Years since quitting: 3.3  Smokeless Tobacco Never Used     Counseling given: Yes   Clinical Intake:  Pre-visit preparation completed: Yes  Pain : No/denies pain Pain Score: 0-No pain     BMI - recorded: 37.13 Nutritional Status: BMI > 30  Obese Nutritional Risks: None Diabetes: No  How often do you need to have someone help you when you read instructions, pamphlets, or other written materials from your doctor or pharmacy?: 1 - Never What is the last grade level you completed in school?: 12+ [employer]   Interpreter Needed?: No     Past Medical History:  Diagnosis Date  . Anemia    as a  young woman  . Diverticulitis 2005   CT   . DJD (degenerative joint disease)   . GERD (gastroesophageal reflux disease)   . Headache(784.0)   . Hiatal hernia   . Hyperlipidemia   . Hypertension   . Hypothyroidism   . Knee pain, right   . Low back pain   . Neuropathy    peroneal nerve  . Obesity   . Pneumonia    as child  . Pre-diabetes   . S/P colonoscopy 2005   Dr. Tamala Julian: sigmoid diverticulosis  . Thyroid disease   . Tobacco abuse    Past Surgical History:  Procedure Laterality Date  . BACK SURGERY    . COLONOSCOPY  12/18/2010   Procedure: COLONOSCOPY;  Surgeon: Dorothyann Peng, MD;  [facility];  Service: Endoscopy;  Laterality: N/A;  10:15am  . cyst removal right wrist    . EYE SURGERY Bilateral    cataract surgery with lens implant  . TONSILLECTOMY    . TOTAL SHOULDER ARTHROPLASTY Left 12/21/2016   Procedure: TOTAL SHOULDER ARTHROPLASTY;  Surgeon: Meredith Pel, MD;  Location: [facility];  Service: Orthopedics;  Laterality: Left;  . TUBAL LIGATION    . umbillical hernia     Family History  Problem Relation Age of Onset  . Heart  attack Sister        pacemaker  . Kidney failure Sister        on dialysis  . Colon cancer Neg Hx    Social History   Socioeconomic History  . Marital status: Widowed    Spouse name: Not on file  . Number of children: Not on file  . Years of education: 38  . Highest education level: Some college, no degree  Occupational History  . Not on file  Social Needs  . Financial resource strain: Somewhat hard  . Food insecurity    Worry: Never true    Inability: Never true  . Transportation needs    Medical: No    Non-medical: No  Tobacco Use  . Smoking status: Former Smoker    Packs/day: 0.25    Years: 45.00    Pack years: 11.25    Types: Cigarettes    Start date: 09/30/1963    Quit date: 08/15/2015    Years since quitting: 3.3   . Smokeless tobacco: Never Used  Substance and Sexual Activity  . Alcohol use: No    Alcohol/week: 0.0 standard drinks  . Drug use: No  . Sexual activity: Not Currently    Birth control/protection: Post-menopausal  Lifestyle  . Physical activity    Days per week: 3 days    Minutes per session: 30 min  . Stress: Only a little  Relationships  . Social connections    Talks on phone: More than three times a week    Gets together: More than three times a week    Attends religious service: More than 4 times per year    Active member of club or organization: Yes    Attends meetings of clubs or organizations: More than 4 times per year    Relationship status: Widowed  Other Topics Concern  . Not on file  Social History Narrative  . Not on file    Outpatient Encounter Medications as of 12/22/2018  Medication Sig  . amLODipine (NORVASC) 5 MG tablet Take 1 tablet (5 mg total) by mouth daily.  Marland Kitchen levothyroxine (SYNTHROID) 100 MCG tablet Take 1 tablet (100 mcg total) by mouth daily.  Marland Kitchen omeprazole (PRILOSEC) 40 MG capsule Take 1 capsule (40 mg total) by mouth daily.   No facility-administered encounter medications on file as of 12/22/2018.     Activities of Daily Living In your present state of health, do you have any difficulty performing the following activities: 12/22/2018  Hearing? N  Vision? N  Difficulty concentrating or making decisions? N  Walking or climbing stairs? N  Dressing or bathing? N  Doing errands, shopping? N  Preparing Food and eating ? N  Using the Toilet? N  In the past six months, have you accidently leaked urine? N  Do you have problems with loss of bowel control? N  Managing your Medications? N  Managing your Finances? N  Housekeeping or managing your Housekeeping? N  Some recent data might be hidden    Patient Care Team: Fayrene Helper, MD as PCP - General (Family Medicine) Gala Romney Cristopher Estimable, MD (Gastroenterology)    Assessment:   This is a  routine wellness examination for Victoria Reyes.  Exercise Activities and Dietary recommendations Current Exercise Habits: Home exercise routine, Type of exercise: walking, Time (Minutes): 45, Frequency (Times/Week): 1, Weekly Exercise (Minutes/Week): 45, Intensity: Mild, Exercise limited by: None identified  Goals    . Increase water intake    . Reduce fat intake  Recommend decreasing the amount of fried/high fatty foods.        Fall Risk Fall Risk  12/22/2018 08/02/2018 12/19/2017 07/26/2017 10/18/2016  Falls in the past year? 1 0 No No Yes  Number falls in past yr: 0 0 - - 2 or more  Injury with Fall? 1 0 - - No  Follow up - - - - Falls evaluation completed;Education provided;Falls prevention discussed  Comment - - - - reveals patient accidentally stumbled   Is the patient's home free of loose throw rugs in walkways, pet beds, electrical cords, etc?   yes      Grab bars in the bathroom? yes      Handrails on the stairs?   yes      Adequate lighting?   yes     Depression Screen PHQ 2/9 Scores 12/22/2018 12/19/2017 07/26/2017 10/18/2016  PHQ - 2 Score 0 0 0 0  PHQ- 9 Score - - - -     Cognitive Function     6CIT Screen 12/22/2018 12/19/2017 10/18/2016  What Year? 0 points 0 points 0 points  What month? 0 points 0 points 0 points  What time? 0 points 0 points 0 points  Count back from 20 0 points 0 points 0 points  Months in reverse 0 points 0 points 0 points  Repeat phrase 0 points 0 points 0 points  Total Score 0 0 0    There is no immunization history for the selected administration types on file for this patient.  Qualifies for Shingles Vaccine? Pt declined  Screening Tests Health Maintenance  Topic Date Due  . TETANUS/TDAP  10/05/1968  . PNA vac Low Risk Adult (1 of 2 - PCV13) 10/06/2014  . INFLUENZA VACCINE  11/11/2018  . MAMMOGRAM  11/09/2020  . COLONOSCOPY  12/30/2020  . DEXA SCAN  Completed  . Hepatitis C Screening  Completed    Cancer Screenings: Lung: Low Dose CT  Chest recommended if Age 65-80 years, 30 pack-year currently smoking OR have quit w/in 15years. Patient does qualify. Breast:  Up to date on Mammogram? Yes   Up to date of Bone Density/Dexa? Yes Colorectal:  Due 2022  Additional Screenings:   Hepatitis C Screening: Completed     Plan:      1. Encounter for Medicare annual wellness exam  I have personally reviewed and noted the following in the patient's chart:   . Medical and social history . Use of alcohol, tobacco or illicit drugs  . Current medications and supplements . Functional ability and status . Nutritional status . Physical activity . Advanced directives . List of other physicians . Hospitalizations, surgeries, and ER visits in previous 12 months . Vitals . Screenings to include cognitive, depression, and falls . Referrals and appointments  In addition, I have reviewed and discussed with patient certain preventive protocols, quality metrics, and best practice recommendations. A written personalized care plan for preventive services as well as general preventive health recommendations were provided to patient.    I provided 20 minutes of non-face-to-face time during this encounter.   Perlie Mayo, NP  12/22/2018

## 2018-12-22 NOTE — Patient Instructions (Signed)
Victoria Reyes , Thank you for taking time to come for your Medicare Wellness Visit. I appreciate your ongoing commitment to your health goals. Please review the following plan we discussed and let me know if I can assist you in the future.   Please continue to practice social distancing to keep you, your family, and our community safe.  If you must go out, please wear a Mask and practice good handwashing.  Screening recommendations/referrals: Colonoscopy: Due 2022 Mammogram: Up to date Bone Density:  Up to date Recommended yearly ophthalmology/optometry visit for glaucoma screening and checkup Recommended yearly dental visit for hygiene and checkup  Vaccinations: Influenza vaccine: Declined Pneumococcal vaccine: Declined Tdap vaccine: Declined Shingles vaccine: Declined  Advanced directives: Completed  Conditions/risks identified: Fall   Next appointment: 02/20/2019    Preventive Care 34 Years and Older, Female Preventive care refers to lifestyle choices and visits with your health care provider that can promote health and wellness. What does preventive care include?  A yearly physical exam. This is also called an annual well check.  Dental exams once or twice a year.  Routine eye exams. Ask your health care provider how often you should have your eyes checked.  Personal lifestyle choices, including:  Daily care of your teeth and gums.  Regular physical activity.  Eating a healthy diet.  Avoiding tobacco and drug use.  Limiting alcohol use.  Practicing safe sex.  Taking low-dose aspirin every day.  Taking vitamin and mineral supplements as recommended by your health care provider. What happens during an annual well check? The services and screenings done by your health care provider during your annual well check will depend on your age, overall health, lifestyle risk factors, and family history of disease. Counseling  Your health care provider may ask you  questions about your:  Alcohol use.  Tobacco use.  Drug use.  Emotional well-being.  Home and relationship well-being.  Sexual activity.  Eating habits.  History of falls.  Memory and ability to understand (cognition).  Work and work Statistician.  Reproductive health. Screening  You may have the following tests or measurements:  Height, weight, and BMI.  Blood pressure.  Lipid and cholesterol levels. These may be checked every 5 years, or more frequently if you are over 24 years old.  Skin check.  Lung cancer screening. You may have this screening every year starting at age 58 if you have a 30-pack-year history of smoking and currently smoke or have quit within the past 15 years.  Fecal occult blood test (FOBT) of the stool. You may have this test every year starting at age 97.  Flexible sigmoidoscopy or colonoscopy. You may have a sigmoidoscopy every 5 years or a colonoscopy every 10 years starting at age 5.  Hepatitis C blood test.  Hepatitis B blood test.  Sexually transmitted disease (STD) testing.  Diabetes screening. This is done by checking your blood sugar (glucose) after you have not eaten for a while (fasting). You may have this done every 1-3 years.  Bone density scan. This is done to screen for osteoporosis. You may have this done starting at age 58.  Mammogram. This may be done every 1-2 years. Talk to your health care provider about how often you should have regular mammograms. Talk with your health care provider about your test results, treatment options, and if necessary, the need for more tests. Vaccines  Your health care provider may recommend certain vaccines, such as:  Influenza vaccine. This is recommended every  year.  Tetanus, diphtheria, and acellular pertussis (Tdap, Td) vaccine. You may need a Td booster every 10 years.  Zoster vaccine. You may need this after age 42.  Pneumococcal 13-valent conjugate (PCV13) vaccine. One dose is  recommended after age 19.  Pneumococcal polysaccharide (PPSV23) vaccine. One dose is recommended after age 72. Talk to your health care provider about which screenings and vaccines you need and how often you need them. This information is not intended to replace advice given to you by your health care provider. Make sure you discuss any questions you have with your health care provider. Document Released: 04/25/2015 Document Revised: 12/17/2015 Document Reviewed: 01/28/2015 Elsevier Interactive Patient Education  2017 [place] Prevention in the Home Falls can cause injuries. They can happen to people of all ages. There are many things you can do to make your home safe and to help prevent falls. What can I do on the outside of my home?  Regularly fix the edges of walkways and driveways and fix any cracks.  Remove anything that might make you trip as you walk through a door, such as a raised step or threshold.  Trim any bushes or trees on the path to your home.  Use bright outdoor lighting.  Clear any walking paths of anything that might make someone trip, such as rocks or tools.  Regularly check to see if handrails are loose or broken. Make sure that both sides of any steps have handrails.  Any raised decks and porches should have guardrails on the edges.  Have any leaves, snow, or ice cleared regularly.  Use sand or salt on walking paths during winter.  Clean up any spills in your garage right away. This includes oil or grease spills. What can I do in the bathroom?  Use night lights.  Install grab bars by the toilet and in the tub and shower. Do not use towel bars as grab bars.  Use non-skid mats or decals in the tub or shower.  If you need to sit down in the shower, use a plastic, non-slip stool.  Keep the floor dry. Clean up any water that spills on the floor as soon as it happens.  Remove soap buildup in the tub or shower regularly.  Attach bath mats  securely with double-sided non-slip rug tape.  Do not have throw rugs and other things on the floor that can make you trip. What can I do in the bedroom?  Use night lights.  Make sure that you have a light by your bed that is easy to reach.  Do not use any sheets or blankets that are too big for your bed. They should not hang down onto the floor.  Have a firm chair that has side arms. You can use this for support while you get dressed.  Do not have throw rugs and other things on the floor that can make you trip. What can I do in the kitchen?  Clean up any spills right away.  Avoid walking on wet floors.  Keep items that you use a lot in easy-to-reach places.  If you need to reach something above you, use a strong step stool that has a grab bar.  Keep electrical cords out of the way.  Do not use floor polish or wax that makes floors slippery. If you must use wax, use non-skid floor wax.  Do not have throw rugs and other things on the floor that can make you trip. What can  I do with my stairs?  Do not leave any items on the stairs.  Make sure that there are handrails on both sides of the stairs and use them. Fix handrails that are broken or loose. Make sure that handrails are as long as the stairways.  Check any carpeting to make sure that it is firmly attached to the stairs. Fix any carpet that is loose or worn.  Avoid having throw rugs at the top or bottom of the stairs. If you do have throw rugs, attach them to the floor with carpet tape.  Make sure that you have a light switch at the top of the stairs and the bottom of the stairs. If you do not have them, ask someone to add them for you. What else can I do to help prevent falls?  Wear shoes that:  Do not have high heels.  Have rubber bottoms.  Are comfortable and fit you well.  Are closed at the toe. Do not wear sandals.  If you use a stepladder:  Make sure that it is fully opened. Do not climb a closed  stepladder.  Make sure that both sides of the stepladder are locked into place.  Ask someone to hold it for you, if possible.  Clearly mark and make sure that you can see:  Any grab bars or handrails.  First and last steps.  Where the edge of each step is.  Use tools that help you move around (mobility aids) if they are needed. These include:  Canes.  Walkers.  Scooters.  Crutches.  Turn on the lights when you go into a dark area. Replace any light bulbs as soon as they burn out.  Set up your furniture so you have a clear path. Avoid moving your furniture around.  If any of your floors are uneven, fix them.  If there are any pets around you, be aware of where they are.  Review your medicines with your doctor. Some medicines can make you feel dizzy. This can increase your chance of falling. Ask your doctor what other things that you can do to help prevent falls. This information is not intended to replace advice given to you by your health care provider. Make sure you discuss any questions you have with your health care provider. Document Released: 01/23/2009 Document Revised: 09/04/2015 Document Reviewed: 05/03/2014 Elsevier Interactive Patient Education  2017 [employer].

## 2018-12-25 ENCOUNTER — Ambulatory Visit: Payer: Medicare Other

## 2019-01-30 ENCOUNTER — Ambulatory Visit: Payer: Medicare HMO | Admitting: Family Medicine

## 2019-02-07 ENCOUNTER — Other Ambulatory Visit: Payer: Self-pay

## 2019-02-07 ENCOUNTER — Encounter: Payer: Self-pay | Admitting: Family Medicine

## 2019-02-07 ENCOUNTER — Ambulatory Visit (INDEPENDENT_AMBULATORY_CARE_PROVIDER_SITE_OTHER): Payer: Medicare HMO | Admitting: Family Medicine

## 2019-02-07 VITALS — BP 138/68 | HR 86 | Temp 97.9°F | Resp 15 | Ht 62.5 in | Wt 208.1 lb

## 2019-02-07 DIAGNOSIS — M7989 Other specified soft tissue disorders: Secondary | ICD-10-CM | POA: Diagnosis not present

## 2019-02-07 DIAGNOSIS — M5441 Lumbago with sciatica, right side: Secondary | ICD-10-CM | POA: Diagnosis not present

## 2019-02-07 DIAGNOSIS — G8929 Other chronic pain: Secondary | ICD-10-CM

## 2019-02-07 MED ORDER — METHYLPREDNISOLONE ACETATE 80 MG/ML IJ SUSP
80.0000 mg | Freq: Once | INTRAMUSCULAR | Status: AC
  Administered 2019-02-07: 80 mg via INTRAMUSCULAR

## 2019-02-07 MED ORDER — KETOROLAC TROMETHAMINE 60 MG/2ML IM SOLN
60.0000 mg | Freq: Once | INTRAMUSCULAR | Status: AC
  Administered 2019-02-07: 60 mg via INTRAMUSCULAR

## 2019-02-07 NOTE — Progress Notes (Signed)
Subjective:     Patient ID: Victoria Reyes, female   DOB: 10-18-1950, 68 y.o.   MRN: [id number]  Victoria Reyes presents for Edema (feet swelling. has been going on for years.)  Victoria Reyes is a 68 year old female patient with a significant history that includes hypertension, hyperlipidemia, hypothyroidism, low back pain, neuropathy, obesity among others.  Reports today for ongoing leg swelling that she has had for years.  Reports that it goes down at night when she has her legs up in bed.  Reports that it is worse when she is sitting for long periods of time or standing for long periods of time.  Reports that she does not like to wear compression socks but she will if that is what she needs to do.  Denies having any pain unless to squeeze them.  Denies having any sensation changes or skin changes.  She denies having any cough any shortness of breath or any other changes that could be associated with uncontrolled high blood pressure or heart failure.  Back pain has been ongoing over the years. Reports that her pain is right now about 5 out of 10.  But at its worst it can be a 10 out of 10 if she has to go lay down.  She thinks is more sciatic related.  Right side predominantly does go into the buttocks not usually down the leg though.  No loss of bowel or bladder.  No weakness associated or sensation changes in the saddle area.  Reports is just a nagging discomfort.  And it really impairs her doing any activity during the day.  Today patient denies signs and symptoms of COVID 19 infection including fever, chills, cough, shortness of breath, and headache.  Past Medical, Surgical, Social History, Allergies, and Medications have been Reviewed.  Past Medical History:  Diagnosis Date  . Anemia    as a  young woman  . Diverticulitis 2005   CT   . DJD (degenerative joint disease)   . GERD (gastroesophageal reflux disease)   . Headache(784.0)   . Hiatal hernia   . Hyperlipidemia   .  Hypertension   . Hypothyroidism   . Knee pain, right   . Low back pain   . Neuropathy    peroneal nerve  . Obesity   . Pneumonia    as child  . Pre-diabetes   . S/P colonoscopy 2005   Dr. Tamala Julian: sigmoid diverticulosis  . Thyroid disease   . Tobacco abuse    Past Surgical History:  Procedure Laterality Date  . BACK SURGERY    . COLONOSCOPY  12/18/2010   Procedure: COLONOSCOPY;  Surgeon: Dorothyann Peng, MD;  [facility];  Service: Endoscopy;  Laterality: N/A;  10:15am  . cyst removal right wrist    . EYE SURGERY Bilateral    cataract surgery with lens implant  . TONSILLECTOMY    . TOTAL SHOULDER ARTHROPLASTY Left 12/21/2016   Procedure: TOTAL SHOULDER ARTHROPLASTY;  Surgeon: Meredith Pel, MD;  Location: [facility];  Service: Orthopedics;  Laterality: Left;  . TUBAL LIGATION    . umbillical hernia     Social History   Socioeconomic History  . Marital status: Widowed    Spouse name: Not on file  . Number of children: Not on file  . Years of education: 86  . Highest education level: Some college, no degree  Occupational History  . Not on file  Social Needs  . Financial resource strain:  Somewhat hard  . Food insecurity    Worry: Never true    Inability: Never true  . Transportation needs    Medical: No    Non-medical: No  Tobacco Use  . Smoking status: Former Smoker    Packs/day: 0.25    Years: 45.00    Pack years: 11.25    Types: Cigarettes    Start date: 09/30/1963    Quit date: 08/15/2015    Years since quitting: 3.4  . Smokeless tobacco: Never Used  Substance and Sexual Activity  . Alcohol use: No    Alcohol/week: 0.0 standard drinks  . Drug use: No  . Sexual activity: Not Currently    Birth control/protection: Post-menopausal  Lifestyle  . Physical activity    Days per week: 3 days    Minutes per session: 30 min  . Stress: Only a little  Relationships  . Social connections    Talks on phone: More than three times a week    Gets together:  More than three times a week    Attends religious service: More than 4 times per year    Active member of club or organization: Yes    Attends meetings of clubs or organizations: More than 4 times per year    Relationship status: Widowed  . Intimate partner violence    Fear of current or ex partner: No    Emotionally abused: No    Physically abused: No    Forced sexual activity: No  Other Topics Concern  . Not on file  Social History Narrative  . Not on file    Outpatient Encounter Medications as of 02/07/2019  Medication Sig  . amLODipine (NORVASC) 5 MG tablet Take 1 tablet (5 mg total) by mouth daily.  Marland Kitchen levothyroxine (SYNTHROID) 100 MCG tablet Take 1 tablet (100 mcg total) by mouth daily.  Marland Kitchen omeprazole (PRILOSEC) 40 MG capsule Take 1 capsule (40 mg total) by mouth daily.   No facility-administered encounter medications on file as of 02/07/2019.    Allergies  Allergen Reactions  . Hydrocodone Nausea Only    REACTION: Nausea  . Sulfa Antibiotics Itching    Review of Systems  Constitutional: Negative for chills and fever.  HENT: Negative.   Eyes: Negative.   Respiratory: Negative.  Negative for cough and shortness of breath.   Cardiovascular: Positive for leg swelling. Negative for chest pain and palpitations.  Gastrointestinal: Negative.   Endocrine: Negative.   Genitourinary: Negative.   Musculoskeletal: Positive for back pain.  Skin: Negative.   Allergic/Immunologic: Negative.   Neurological: Negative.   Hematological: Negative.   Psychiatric/Behavioral: Negative.   All other systems reviewed and are negative.      Objective:     BP 138/68   Pulse 86   Temp 97.9 F (36.6 C) (Oral)   Resp 15   Ht 5' 2.5" (1.588 m)   Wt 208 lb 1.9 oz (94.4 kg)   SpO2 94%   BMI 37.46 kg/m   Physical Exam Vitals signs and nursing note reviewed.  Constitutional:      Appearance: Normal appearance. She is obese.  HENT:     Head: Normocephalic and atraumatic.      Right Ear: External ear normal.     Left Ear: External ear normal.     Nose: Nose normal.     Mouth/Throat:     Pharynx: Oropharynx is clear.  Eyes:     General:        Right eye: No  discharge.        Left eye: No discharge.     Conjunctiva/sclera: Conjunctivae normal.  Neck:     Musculoskeletal: Normal range of motion and neck supple.  Cardiovascular:     Rate and Rhythm: Normal rate and regular rhythm.     Pulses: Normal pulses.          Dorsalis pedis pulses are 2+ on the right side and 2+ on the left side.       Posterior tibial pulses are 2+ on the right side and 2+ on the left side.     Heart sounds: Normal heart sounds.  Pulmonary:     Effort: Pulmonary effort is normal.     Breath sounds: Normal breath sounds.  Musculoskeletal:     Lumbar back: She exhibits decreased range of motion, tenderness and pain.     Right lower leg: Edema present.     Left lower leg: Edema present.     Right foot: Normal range of motion.     Left foot: Normal range of motion.  Feet:     Right foot:     Skin integrity: Dry skin present. No skin breakdown.     Left foot:     Skin integrity: Dry skin present. No skin breakdown.  Skin:    General: Skin is warm and dry.  Neurological:     General: No focal deficit present.     Mental Status: She is alert and oriented to person, place, and time.  Psychiatric:        Mood and Affect: Mood normal.        Behavior: Behavior normal.        Thought Content: Thought content normal.        Judgment: Judgment normal.        Assessment and Plan       1. Chronic right-sided low back pain with right-sided sciatica Chronic right-sided low back pain with some sciatica that goes into the buttocks.  No signs of cauda equina.  Provided with injections today to help with discomfort.  And inflammation.  - ketorolac (TORADOL) injection 60 mg - methylPREDNISolone acetate (DEPO-MEDROL) injection 80 mg  2. Leg swelling Has some chronic leg swelling and  advised to wear compression socks at least 8 hours a day.  Especially when she is dependent sitting or standing for long periods of time.  Pulses are intact skin is without ulceration or skin changes.  She has no signs of symptoms of shortness of breath PE with otherwise unremarkable.  Follow up: 02/20/2019  Perlie Mayo, DNP, AGNP-BC [address], [address] [place], [place] [address] Office Hours: Mon-Thurs 8 am-5 pm; Fri 8 am-12 pm Office Phone:  [phone]  Office Fax: [phone]

## 2019-02-07 NOTE — Patient Instructions (Addendum)
  I appreciate the opportunity to provide you with the care for your health and wellness. Today we discussed: leg swelling and back pain  Follow up: 02/20/2019  Please wear compression socks at least 8 hours of the day.  Especially if you are going to be up walking around or sitting for long periods of time.  You can take a break from these every couple hours and then put them back on to help get used to them and to relieve some of the compression if it is hard to tolerate.  Additionally appear to be sitting for long periods of time make sure that you get up and walk around every 20 minutes of an hour that you sit.  Today you reported that your back pain was giving her trouble as well.  You have been provided with 2 injections to help with your discomfort and pain.  Hopefully these will relieve your discomfort and get you back to be able to stand and walk around.  Remember even though you are in pain mobility is best for discomfort especially in your back. So walk as much as you can.  Hope you feel better it was a pleasure to meet you. :)  Please continue to practice social distancing to keep you, your family, and our community safe.  If you must go out, please wear a mask and practice good handwashing.  It was a pleasure to see you and I look forward to continuing to work together on your health and well-being. Please do not hesitate to call the office if you need care or have questions about your care.  Have a wonderful day and week. With Gratitude, Cherly Beach, DNP, AGNP-BC

## 2019-02-20 ENCOUNTER — Encounter: Payer: Medicare HMO | Admitting: Family Medicine

## 2019-03-06 DIAGNOSIS — H5213 Myopia, bilateral: Secondary | ICD-10-CM | POA: Diagnosis not present

## 2019-03-07 ENCOUNTER — Encounter: Payer: Self-pay | Admitting: Family Medicine

## 2019-03-07 ENCOUNTER — Other Ambulatory Visit: Payer: Self-pay

## 2019-03-07 ENCOUNTER — Ambulatory Visit (INDEPENDENT_AMBULATORY_CARE_PROVIDER_SITE_OTHER): Payer: Medicare HMO | Admitting: Family Medicine

## 2019-03-07 VITALS — BP 112/74 | HR 74 | Temp 97.4°F | Resp 15 | Ht 62.5 in | Wt 208.0 lb

## 2019-03-07 DIAGNOSIS — E038 Other specified hypothyroidism: Secondary | ICD-10-CM

## 2019-03-07 DIAGNOSIS — E785 Hyperlipidemia, unspecified: Secondary | ICD-10-CM

## 2019-03-07 DIAGNOSIS — E559 Vitamin D deficiency, unspecified: Secondary | ICD-10-CM

## 2019-03-07 DIAGNOSIS — Z2821 Immunization not carried out because of patient refusal: Secondary | ICD-10-CM

## 2019-03-07 DIAGNOSIS — I10 Essential (primary) hypertension: Secondary | ICD-10-CM

## 2019-03-07 DIAGNOSIS — Z0000 Encounter for general adult medical examination without abnormal findings: Secondary | ICD-10-CM

## 2019-03-07 NOTE — Patient Instructions (Signed)
F/U in office wqith MD in 6 months, call if you need me sooner  Annual physical exam with MD in 1 year  Please get TSH, vit , vhem 7 and EGr an CBC non fasting in next 1 week  It is important that you exercise regularly at least 30 minutes 5 times a week. If you develop chest pain, have severe difficulty breathing, or feel very tired, stop exercising immediately and seek medical attention   Think about what you will eat, plan ahead. Choose " clean, green, fresh or frozen" over canned, processed or packaged foods which are more sugary, salty and fatty. 70 to 75% of food eaten should be vegetables and fruit. Three meals at set times with snacks allowed between meals, but they must be fruit or vegetables. Aim to eat over a 12 hour period , example 7 am to 7 pm, and STOP after  your last meal of the day. Drink water,generally about 64 ounces per day, no other drink is as healthy. Fruit juice is best enjoyed in a healthy way, by EATING the fruit.  Pls reduce candy, and fruit juices  pleease reconsider vaccines  Thanks for choosing [facility], we consider it a privelige to serve you.

## 2019-03-11 DIAGNOSIS — Z2821 Immunization not carried out because of patient refusal: Secondary | ICD-10-CM | POA: Insufficient documentation

## 2019-03-11 NOTE — Assessment & Plan Note (Signed)
  Patient re-educated about  the importance of commitment to a  minimum of 150 minutes of exercise per week as able.  The importance of healthy food choices with portion control discussed, as well as eating regularly and within a 12 hour window most days. The need to choose "clean , green" food 50 to 75% of the time is discussed, as well as to make water the primary drink and set a goal of 64 ounces water daily.    Weight /BMI 03/07/2019 02/07/2019 12/22/2018  WEIGHT 208 lb 208 lb 1.9 oz 203 lb  HEIGHT 5' 2.5" 5' 2.5" 5\' 2"   BMI 37.44 kg/m2 37.46 kg/m2 37.13 kg/m2

## 2019-03-11 NOTE — Progress Notes (Signed)
    Victoria Reyes     MRN: [id number]      DOB: Mar 06, 1951  HPI: Patient is in for annual physical exam. No other health concerns are expressed or addressed at the visit. Recent labs, if available are reviewed. Immunization is reviewed , and  updated if needed.   PE: BP 112/74   Pulse 74   Temp (!) 97.4 F (36.3 C) (Temporal)   Resp 15   Ht 5' 2.5" (1.588 m)   Wt 208 lb (94.3 kg)   SpO2 98%   BMI 37.44 kg/m   Pleasant  female, alert and oriented x 3, in no cardio-pulmonary distress. Afebrile. HEENT No facial trauma or asymetry. Sinuses non tender.  Extra occullar muscles intact.. External ears normal, . Neck: supple, no adenopathy,JVD or thyromegaly.No bruits.  Chest: Clear to ascultation bilaterally.No crackles or wheezes. Non tender to palpation  Breast: No asymetry,no masses or lumps. No tenderness. No nipple discharge or inversion. No axillary or supraclavicular adenopathy  Cardiovascular system; Heart sounds normal,  S1 and  S2 ,no S3.  No murmur, or thrill. Apical beat not displaced Peripheral pulses normal.  Abdomen: Soft, non tender, no organomegaly or masses. No bruits. Bowel sounds normal. No guarding, tenderness or rebound.     Musculoskeletal exam: Full ROM of spine, hips , shoulders and knees. No deformity ,swelling or crepitus noted. No muscle wasting or atrophy.   Neurologic: Cranial nerves 2 to 12 intact. Power, tone ,sensation and reflexes normal throughout. No disturbance in gait. No tremor.  Skin: Intact, no ulceration, erythema , scaling or rash noted. Pigmentation normal throughout  Psych; Normal mood and affect. Judgement and concentration normal   Assessment & Plan:   Immunization refused Re educated and again refuses influenza and pneumonia vaccines  Morbid obesity ([name])  Patient re-educated about  the importance of commitment to a  minimum of 150 minutes of exercise per week as able.  The importance of healthy  food choices with portion control discussed, as well as eating regularly and within a 12 hour window most days. The need to choose "clean , green" food 50 to 75% of the time is discussed, as well as to make water the primary drink and set a goal of 64 ounces water daily.    Weight /BMI 03/07/2019 02/07/2019 12/22/2018  WEIGHT 208 lb 208 lb 1.9 oz 203 lb  HEIGHT 5' 2.5" 5' 2.5" 5\' 2"   BMI 37.44 kg/m2 37.46 kg/m2 37.13 kg/m2     Annual physical exam Annual exam as documented. Counseling done  re healthy lifestyle involving commitment to 150 minutes exercise per week, heart healthy diet, and attaining healthy weight.The importance of adequate sleep also discussed. Regular seat belt use and home safety, is also discussed. Changes in health habits are decided on by the patient with goals and time frames  set for achieving them. Immunization and cancer screening needs are specifically addressed at this visit.

## 2019-03-11 NOTE — Assessment & Plan Note (Signed)
Re educated and again refuses influenza and pneumonia vaccines

## 2019-03-11 NOTE — Assessment & Plan Note (Signed)

## 2019-03-13 DIAGNOSIS — I10 Essential (primary) hypertension: Secondary | ICD-10-CM | POA: Diagnosis not present

## 2019-03-13 DIAGNOSIS — E559 Vitamin D deficiency, unspecified: Secondary | ICD-10-CM | POA: Diagnosis not present

## 2019-03-14 ENCOUNTER — Encounter: Payer: Self-pay | Admitting: Family Medicine

## 2019-03-14 ENCOUNTER — Other Ambulatory Visit: Payer: Self-pay | Admitting: Family Medicine

## 2019-03-14 LAB — CBC
HCT: 36.6 % (ref 35.0–45.0)
Hemoglobin: 12.1 g/dL (ref 11.7–15.5)
MCH: 27.7 pg (ref 27.0–33.0)
MCHC: 33.1 g/dL (ref 32.0–36.0)
MCV: 83.8 fL (ref 80.0–100.0)
MPV: 11.7 fL (ref 7.5–12.5)
Platelets: 289 10*3/uL (ref 140–400)
RBC: 4.37 10*6/uL (ref 3.80–5.10)
RDW: 13.3 % (ref 11.0–15.0)
WBC: 11.1 10*3/uL — ABNORMAL HIGH (ref 3.8–10.8)

## 2019-03-14 LAB — BASIC METABOLIC PANEL WITH GFR
BUN: 12 mg/dL (ref 7–25)
CO2: 26 mmol/L (ref 20–32)
Calcium: 9 mg/dL (ref 8.6–10.4)
Chloride: 106 mmol/L (ref 98–110)
Creat: 0.78 mg/dL (ref 0.50–0.99)
GFR, Est African American: 90 mL/min/{1.73_m2} (ref >=60)
GFR, Est Non African American: 78 mL/min/{1.73_m2} (ref >=60)
Glucose, Bld: 84 mg/dL (ref 65–99)
Potassium: 4.2 mmol/L (ref 3.5–5.3)
Sodium: 142 mmol/L (ref 135–146)

## 2019-03-14 LAB — VITAMIN D 25 HYDROXY (VIT D DEFICIENCY, FRACTURES): Vit D, 25-Hydroxy: 33 ng/mL (ref 30–100)

## 2019-03-14 LAB — TSH: TSH: 0.1 mIU/L — ABNORMAL LOW (ref 0.40–4.50)

## 2019-03-14 MED ORDER — LEVOTHYROXINE SODIUM 75 MCG PO TABS: 75.0000 ug | ORAL_TABLET | Freq: Every day | ORAL | 3 refills | Status: DC

## 2019-04-03 ENCOUNTER — Encounter: Payer: Medicare HMO | Admitting: Family Medicine

## 2019-07-08 ENCOUNTER — Other Ambulatory Visit: Payer: Self-pay | Admitting: Family Medicine

## 2019-08-30 ENCOUNTER — Other Ambulatory Visit: Payer: Self-pay | Admitting: Family Medicine

## 2019-09-04 ENCOUNTER — Ambulatory Visit: Payer: Medicare HMO | Admitting: Family Medicine

## 2019-09-06 ENCOUNTER — Other Ambulatory Visit: Payer: Self-pay | Admitting: Family Medicine

## 2019-09-11 ENCOUNTER — Encounter: Payer: Self-pay | Admitting: Internal Medicine

## 2019-09-11 ENCOUNTER — Other Ambulatory Visit: Payer: Self-pay

## 2019-09-11 ENCOUNTER — Ambulatory Visit (INDEPENDENT_AMBULATORY_CARE_PROVIDER_SITE_OTHER): Payer: Medicare HMO | Admitting: Internal Medicine

## 2019-09-11 VITALS — BP 134/75 | HR 84 | Temp 98.3°F | Resp 15 | Ht 61.5 in | Wt 214.0 lb

## 2019-09-11 DIAGNOSIS — E785 Hyperlipidemia, unspecified: Secondary | ICD-10-CM

## 2019-09-11 DIAGNOSIS — I10 Essential (primary) hypertension: Secondary | ICD-10-CM | POA: Diagnosis not present

## 2019-09-11 DIAGNOSIS — M545 Low back pain, unspecified: Secondary | ICD-10-CM

## 2019-09-11 DIAGNOSIS — G8929 Other chronic pain: Secondary | ICD-10-CM | POA: Insufficient documentation

## 2019-09-11 DIAGNOSIS — K219 Gastro-esophageal reflux disease without esophagitis: Secondary | ICD-10-CM | POA: Diagnosis not present

## 2019-09-11 DIAGNOSIS — E039 Hypothyroidism, unspecified: Secondary | ICD-10-CM | POA: Diagnosis not present

## 2019-09-11 DIAGNOSIS — M7989 Other specified soft tissue disorders: Secondary | ICD-10-CM | POA: Diagnosis not present

## 2019-09-11 NOTE — Progress Notes (Signed)
Established Patient Office Visit  Subjective:  Patient ID: Victoria Reyes, female    DOB: 23-Jan-1951  Age: 69 y.o. MRN: [id number]  CC:  Chief Complaint  Patient presents with  . Hypertension    follow up  . Hypothyroidism  . thinning hair    x 1 year  . Gastroesophageal Reflux    HPI Victoria Reyes Is 69 year old female with past medical history of hypertension, hypothyroidism, GERD, chronic back pain, chronic left leg swelling presents to our office for follow-up on chronic medical issues.  Today patient reports that she is doing well overall.  Complaining of hair thinning and hair fall since 2 years.  Denies association with photosensitivity, facial rash, arthritis, joint stiffness.  Has chronic back pain for which she had back surgery long time ago.  Patient tells me that she gets steroid injection every now and then for severe pain.  She takes 2 Aleve once daily.  She was prescribed gabapentin long time ago with she stopped taking.  Refused physical therapy.  Has chronic left leg tingling/numbness and swelling associated with back surgery.  Denies redness or pain or previous history of DVT in the left leg.  Has hypothyroidism for which she has been taking Synthyroid 75 mcg once daily.  Her last TSH was checked on 03/13/2019 which came back on lower side.  Her Synthyroid dose was decreased from 100 mcg to 75 mcg.  Her blood pressure is well controlled today.  She takes amlodipine 5 mg once daily.  Reports being compliant with her medication.  Denies headache, blurry vision, chest pain, shortness of breath, palpitation, orthopnea, PND, nausea, vomiting, diarrhea, urinary symptoms.  She tells me that she is scared of needles that is why she have not received Covid vaccine yet.    Past Medical History:  Diagnosis Date  . Anemia    as a  young woman  . Diverticulitis 2005   CT   . DJD (degenerative joint disease)   . GERD (gastroesophageal reflux disease)   .  Headache(784.0)   . Hiatal hernia   . Hyperlipidemia   . Hypertension   . Hypothyroidism   . Knee pain, right   . Low back pain   . Neuropathy    peroneal nerve  . Obesity   . Pneumonia    as child  . Pre-diabetes   . S/P colonoscopy 2005   Dr. Tamala Julian: sigmoid diverticulosis  . Thyroid disease   . Tobacco abuse     Past Surgical History:  Procedure Laterality Date  . BACK SURGERY    . COLONOSCOPY  12/18/2010   Procedure: COLONOSCOPY;  Surgeon: Dorothyann Peng, MD;  [facility];  Service: Endoscopy;  Laterality: N/A;  10:15am  . cyst removal right wrist    . EYE SURGERY Bilateral    cataract surgery with lens implant  . TONSILLECTOMY    . TOTAL SHOULDER ARTHROPLASTY Left 12/21/2016   Procedure: TOTAL SHOULDER ARTHROPLASTY;  Surgeon: Meredith Pel, MD;  Location: [facility];  Service: Orthopedics;  Laterality: Left;  . TUBAL LIGATION    . umbillical hernia      Family History  Problem Relation Age of Onset  . Heart attack Sister        pacemaker  . Kidney failure Sister        on dialysis  . Colon cancer Neg Hx     Social History   Socioeconomic History  . Marital status: Widowed    Spouse  name: Not on file  . Number of children: Not on file  . Years of education: 71  . Highest education level: Some college, no degree  Occupational History  . Not on file  Tobacco Use  . Smoking status: Former Smoker    Packs/day: 0.25    Years: 45.00    Pack years: 11.25    Types: Cigarettes    Start date: 09/30/1963    Quit date: 08/15/2015    Years since quitting: 4.0  . Smokeless tobacco: Never Used  Substance and Sexual Activity  . Alcohol use: No    Alcohol/week: 0.0 standard drinks  . Drug use: No  . Sexual activity: Not Currently    Birth control/protection: Post-menopausal  Other Topics Concern  . Not on file  Social History Narrative  . Not on file   Social Determinants of Health   Financial Resource Strain:   . Difficulty of Paying Living  Expenses:   Food Insecurity:   . Worried About Charity fundraiser in the Last Year:   . Arboriculturist in the Last Year:   Transportation Needs:   . Film/video editor (Medical):   Marland Kitchen Lack of Transportation (Non-Medical):   Physical Activity:   . Days of Exercise per Week:   . Minutes of Exercise per Session:   Stress:   . Feeling of Stress :   Social Connections:   . Frequency of Communication with Friends and Family:   . Frequency of Social Gatherings with Friends and Family:   . Attends Religious Services:   . Active Member of Clubs or Organizations:   . Attends Archivist Meetings:   Marland Kitchen Marital Status:   Intimate Partner Violence:   . Fear of Current or Ex-Partner:   . Emotionally Abused:   Marland Kitchen Physically Abused:   . Sexually Abused:     Outpatient Medications Prior to Visit  Medication Sig Dispense Refill  . amLODipine (NORVASC) 5 MG tablet TAKE 1 TABLET BY MOUTH EVERY DAY 90 tablet 3  . levothyroxine (SYNTHROID) 75 MCG tablet TAKE 1 TABLET BY MOUTH EVERY DAY 90 tablet 1  . omeprazole (PRILOSEC) 40 MG capsule TAKE 1 CAPSULE BY MOUTH EVERY DAY 90 capsule 1   No facility-administered medications prior to visit.    Allergies  Allergen Reactions  . Hydrocodone Nausea Only    REACTION: Nausea  . Sulfa Antibiotics Itching    ROS Review of Systems  Constitutional: Negative.   HENT: Negative.   Eyes: Negative.   Respiratory: Negative.   Cardiovascular: Positive for leg swelling.  Gastrointestinal: Negative.   Genitourinary: Negative.   Musculoskeletal: Positive for back pain.  Allergic/Immunologic: Negative.   Neurological: Negative.   Hematological: Negative.   Psychiatric/Behavioral: Negative.       Objective:    Physical Exam  Constitutional: She is oriented to person, place, and time. She appears well-developed and well-nourished.  HENT:  Head: Normocephalic and atraumatic.  Eyes: Pupils are equal, round, and reactive to light.    Cardiovascular: Normal rate, regular rhythm and normal heart sounds.  Left leg: Nonpitting swelling noted in left leg.  Pulmonary/Chest: Effort normal and breath sounds normal.  Abdominal: Soft. Bowel sounds are normal.  Musculoskeletal:        General: Edema present. Normal range of motion.     Cervical back: Normal range of motion and neck supple.  Neurological: She is alert and oriented to person, place, and time. She has normal reflexes.  Psychiatric: She  has a normal mood and affect. Her behavior is normal. Judgment and thought content normal.    BP 134/75   Pulse 84   Temp 98.3 F (36.8 C) (Temporal)   Resp 15   Ht 5' 1.5" (1.562 m)   Wt 214 lb 0.6 oz (97.1 kg)   SpO2 98%   BMI 39.79 kg/m  Wt Readings from Last 3 Encounters:  09/11/19 214 lb 0.6 oz (97.1 kg)  03/07/19 208 lb (94.3 kg)  02/07/19 208 lb 1.9 oz (94.4 kg)     Health Maintenance Due  Topic Date Due  . COVID-19 Vaccine (1) Never done  . TETANUS/TDAP  Never done    There are no preventive care reminders to display for this patient.  Lab Results  Component Value Date   TSH 0.10 (L) 03/13/2019   Lab Results  Component Value Date   WBC 11.1 (H) 03/13/2019   HGB 12.1 03/13/2019   HCT 36.6 03/13/2019   MCV 83.8 03/13/2019   PLT 289 03/13/2019   Lab Results  Component Value Date   NA 142 03/13/2019   K 4.2 03/13/2019   CO2 26 03/13/2019   GLUCOSE 84 03/13/2019   BUN 12 03/13/2019   CREATININE 0.78 03/13/2019   BILITOT 0.7 02/28/2018   ALKPHOS 108 07/12/2016   AST 14 02/28/2018   ALT 6 02/28/2018   PROT 6.4 02/28/2018   ALBUMIN 3.7 07/12/2016   CALCIUM 9.0 03/13/2019   ANIONGAP 10 12/15/2016   Lab Results  Component Value Date   CHOL 191 08/04/2018   Lab Results  Component Value Date   HDL 56 08/04/2018   Lab Results  Component Value Date   LDLCALC 113 (H) 08/04/2018   Lab Results  Component Value Date   TRIG 116 08/04/2018   Lab Results  Component Value Date   CHOLHDL 3.4  08/04/2018   Lab Results  Component Value Date   HGBA1C 5.6 09/22/2017      Assessment & Plan:   Hypertension: Well-controlled -Continue amlodipine 5 mg once daily -Advised DASH diet, exercise.  Hypothyroidism: Check TSH -Continue Synthyroid 75 mcg once daily  Chronic back pain: -Patient refused physical therapy and gabapentin -Discussed to avoid taking Aleve due to increased risk for gastric ulcers as well as kidney problems. -Advised to take Tylenol arthritis as needed.  Avoid NSAIDs.  Left leg swelling: Chronic -Advised leg elevation, compression stockings.  GERD: Stable -Continue PPI  Hair fall/hair thinning: -Could be secondary to underlying hypothyroidism.  -TSH.  Advised to take over-the-counter biotin supplements.  Check labs including fasting lipid panel, CMP, TSH.  Follow-up in 1 month.  Consider restarting gabapentin 300 mg 3 times daily.  Problem List Items Addressed This Visit      Cardiovascular and Mediastinum   HTN (hypertension) - Primary   Relevant Orders   COMPLETE METABOLIC PANEL WITH GFR   TSH     Digestive   GERD (gastroesophageal reflux disease)     Endocrine   Hypothyroidism   Relevant Orders   TSH     Other   Morbid obesity ([place])   Relevant Orders   COMPLETE METABOLIC PANEL WITH GFR   Lipid panel   Hyperlipidemia with target LDL less than 100   Relevant Orders   COMPLETE METABOLIC PANEL WITH GFR   Lipid panel   Left leg swelling   Chronic back pain      No orders of the defined types were placed in this encounter.   Follow-up: No follow-ups  on file.    Mckinley Jewel, MD

## 2019-09-11 NOTE — Patient Instructions (Addendum)
Check fasting lipid panel, TSH, CMP Leg elevation & Compression stockings  Follow up in 1 month. Call if needed.

## 2019-09-12 DIAGNOSIS — I10 Essential (primary) hypertension: Secondary | ICD-10-CM | POA: Diagnosis not present

## 2019-09-12 DIAGNOSIS — E039 Hypothyroidism, unspecified: Secondary | ICD-10-CM | POA: Diagnosis not present

## 2019-09-12 DIAGNOSIS — E785 Hyperlipidemia, unspecified: Secondary | ICD-10-CM | POA: Diagnosis not present

## 2019-09-12 LAB — COMPLETE METABOLIC PANEL WITH GFR
AG Ratio: 1.5 (calc) (ref 1.0–2.5)
ALT: 7 U/L (ref 6–29)
AST: 12 U/L (ref 10–35)
Albumin: 4 g/dL (ref 3.6–5.1)
Alkaline phosphatase (APISO): 116 U/L (ref 37–153)
BUN: 11 mg/dL (ref 7–25)
CO2: 26 mmol/L (ref 20–32)
Calcium: 9.2 mg/dL (ref 8.6–10.4)
Chloride: 106 mmol/L (ref 98–110)
Creat: 0.81 mg/dL (ref 0.50–0.99)
GFR, Est African American: 86 mL/min/{1.73_m2} (ref >=60)
GFR, Est Non African American: 74 mL/min/{1.73_m2} (ref >=60)
Globulin: 2.6 g/dL (calc) (ref 1.9–3.7)
Glucose, Bld: 91 mg/dL (ref 65–139)
Potassium: 4.3 mmol/L (ref 3.5–5.3)
Sodium: 141 mmol/L (ref 135–146)
Total Bilirubin: 0.5 mg/dL (ref 0.2–1.2)
Total Protein: 6.6 g/dL (ref 6.1–8.1)

## 2019-09-12 LAB — LIPID PANEL
Cholesterol: 229 mg/dL — ABNORMAL HIGH (ref <200)
HDL: 60 mg/dL (ref >=50)
LDL Cholesterol (Calc): 142 mg/dL (calc) — ABNORMAL HIGH
Non-HDL Cholesterol (Calc): 169 mg/dL (calc) — ABNORMAL HIGH (ref <130)
Total CHOL/HDL Ratio: 3.8 (calc) (ref <5.0)
Triglycerides: 144 mg/dL (ref <150)

## 2019-09-12 LAB — TSH: TSH: 7.47 mIU/L — ABNORMAL HIGH (ref 0.40–4.50)

## 2019-09-13 ENCOUNTER — Other Ambulatory Visit: Payer: Self-pay | Admitting: Internal Medicine

## 2019-09-13 MED ORDER — ATORVASTATIN CALCIUM 40 MG PO TABS: 40.0000 mg | ORAL_TABLET | Freq: Every day | ORAL | 3 refills | Status: DC

## 2019-09-13 MED ORDER — LEVOTHYROXINE SODIUM 100 MCG PO TABS: 100.0000 ug | ORAL_TABLET | Freq: Every day | ORAL | 3 refills | Status: DC

## 2019-10-04 ENCOUNTER — Other Ambulatory Visit: Payer: Self-pay

## 2019-10-04 ENCOUNTER — Telehealth (INDEPENDENT_AMBULATORY_CARE_PROVIDER_SITE_OTHER): Payer: Medicare HMO | Admitting: Family Medicine

## 2019-10-04 ENCOUNTER — Encounter: Payer: Self-pay | Admitting: Family Medicine

## 2019-10-04 VITALS — BP 118/74 | Temp 97.7°F | Resp 16 | Ht 62.5 in | Wt 214.0 lb

## 2019-10-04 DIAGNOSIS — M545 Low back pain, unspecified: Secondary | ICD-10-CM

## 2019-10-04 DIAGNOSIS — E038 Other specified hypothyroidism: Secondary | ICD-10-CM

## 2019-10-04 DIAGNOSIS — E785 Hyperlipidemia, unspecified: Secondary | ICD-10-CM | POA: Diagnosis not present

## 2019-10-04 DIAGNOSIS — I10 Essential (primary) hypertension: Secondary | ICD-10-CM | POA: Diagnosis not present

## 2019-10-04 DIAGNOSIS — M7989 Other specified soft tissue disorders: Secondary | ICD-10-CM

## 2019-10-04 DIAGNOSIS — G8929 Other chronic pain: Secondary | ICD-10-CM

## 2019-10-04 MED ORDER — FUROSEMIDE 20 MG PO TABS: ORAL_TABLET | ORAL | 0 refills | Status: DC

## 2019-10-04 MED ORDER — POTASSIUM CHLORIDE ER 10 MEQ PO TBCR: EXTENDED_RELEASE_TABLET | ORAL | 0 refills | Status: DC

## 2019-10-04 NOTE — Progress Notes (Signed)
   Victoria Reyes     MRN: [id number]      DOB: [birth year]   HPI Victoria Reyes is here with left leg swelling since 2007 , happens in Summer months. States overnight swelling decreases, but left ankle stayswollen and tender ROS Denies recent fever or chills. Denies sinus pressure, nasal congestion, ear pain or sore throat. Denies chest congestion, productive cough or wheezing. Denies chest pains, palpitations and leg swelling Denies abdominal pain, nausea, vomiting,diarrhea or constipation.   Denies dysuria, frequency, hesitancy or incontinence. Denies joint pain, swelling and limitation in mobility. Denies headaches, seizures, numbness, or tingling. Denies depression, anxiety or insomnia. Denies skin break down or rash.   PE  BP 118/74 Comment: patient hasn't taken it  Temp 97.7 F (36.5 C) (Temporal)   Resp 16   Ht 5' 2.5" (1.588 m)   Wt 214 lb (97.1 kg)   BMI 38.52 kg/m   Patient alert and oriented and in no cardiopulmonary distress.  HEENT: No facial asymmetry, EOMI,     Neck supple .  Chest: Clear to auscultation bilaterally.  CVS: S1, S2 no murmurs, no S3.Regular rate.  ABD: Soft non tender.   Ext:  One plus edema of LLE  MS: Adequate ROM spine, shoulders, hips and knees.  Skin: Intact, no ulcerations or rash noted.  Psych: Good eye contact, normal affect. Memory intact not anxious or depressed appearing.  CNS: CN 2-12 intact, power,  normal throughout.no focal deficits noted.   Assessment & Plan  Left leg swelling Short course of lasix and potassium, then as needed, also leg elevation and hose compression  HTN (hypertension) Controlled, no change in medication DASH diet and commitment to daily physical activity for a minimum of 30 minutes discussed and encouraged, as a part of hypertension management. The importance of attaining a healthy weight is also discussed.  BP/Weight 10/04/2019 09/11/2019 03/07/2019 02/07/2019 12/22/2018 11/22/2018 1/91/4782    Systolic BP 956 213 086 578 469 629 528  Diastolic BP 74 75 74 68 60 60 70  Wt. (Lbs) 214 214.04 208 208.12 203 206 206  BMI 38.52 39.79 37.44 37.46 37.13 37.68 37.68       Morbid obesity (HCC)  Patient re-educated about  the importance of commitment to a  minimum of 150 minutes of exercise per week as able.  The importance of healthy food choices with portion control discussed, as well as eating regularly and within a 12 hour window most days. The need to choose "clean , green" food 50 to 75% of the time is discussed, as well as to make water the primary drink and set a goal of 64 ounces water daily.    Weight /BMI 10/04/2019 09/11/2019 03/07/2019  WEIGHT 214 lb 214 lb 0.6 oz 208 lb  HEIGHT 5' 2.5" 5' 1.5" 5' 2.5"  BMI 38.52 kg/m2 39.79 kg/m2 37.44 kg/m2  Associated with hypertension and arthritis    Hyperlipidemia with target LDL less than 100 Hyperlipidemia:Low fat diet discussed and encouraged.   Lipid Panel  Lab Results  Component Value Date   CHOL 229 (H) 09/12/2019   HDL 60 09/12/2019   LDLCALC 142 (H) 09/12/2019   TRIG 144 09/12/2019   CHOLHDL 3.8 09/12/2019   Needs to lower fat intake    Chronic back pain Unchanged, working on weight loss and stretching exercise

## 2019-10-04 NOTE — Patient Instructions (Addendum)
Annual physical exam  With MD in November when due, call if you need me before  Wellness visit when due    CBC, fasting lipid, cmp and EGFr, TSH 1 week before next   New for  Leg swelling is a 5 day course of lasix and potassium then use as needed.  Please use support sock regularly and reduce salt intake  Take alleve once daily for the next 5 days  It is important that you exercise regularly at least 30 minutes 5 times a week. If you develop chest pain, have severe difficulty breathing, or feel very tired, stop exercising immediately and seek medical attention  Think about what you will eat, plan ahead. Choose " clean, green, fresh or frozen" over canned, processed or packaged foods which are more sugary, salty and fatty. 70 to 75% of food eaten should be vegetables and fruit. Three meals at set times with snacks allowed between meals, but they must be fruit or vegetables. Aim to eat over a 12 hour period , example 7 am to 7 pm, and STOP after  your last meal of the day. Drink water,generally about 64 ounces per day, no other drink is as healthy. Fruit juice is best enjoyed in a healthy way, by EATING the fruit. Thanks for choosing [facility], we consider it a privelige to serve you.

## 2019-10-07 ENCOUNTER — Encounter: Payer: Self-pay | Admitting: Family Medicine

## 2019-10-07 NOTE — Assessment & Plan Note (Signed)
  Patient re-educated about  the importance of commitment to a  minimum of 150 minutes of exercise per week as able.  The importance of healthy food choices with portion control discussed, as well as eating regularly and within a 12 hour window most days. The need to choose "clean , green" food 50 to 75% of the time is discussed, as well as to make water the primary drink and set a goal of 64 ounces water daily.    Weight /BMI 10/04/2019 09/11/2019 03/07/2019  WEIGHT 214 lb 214 lb 0.6 oz 208 lb  HEIGHT 5' 2.5" 5' 1.5" 5' 2.5"  BMI 38.52 kg/m2 39.79 kg/m2 37.44 kg/m2  Associated with hypertension and arthritis

## 2019-10-07 NOTE — Assessment & Plan Note (Signed)
Unchanged, working on weight loss and stretching exercise

## 2019-10-07 NOTE — Assessment & Plan Note (Signed)
Controlled, no change in medication DASH diet and commitment to daily physical activity for a minimum of 30 minutes discussed and encouraged, as a part of hypertension management. The importance of attaining a healthy weight is also discussed.  BP/Weight 10/04/2019 09/11/2019 03/07/2019 02/07/2019 12/22/2018 11/22/2018 1/67/5612  Systolic BP 548 323 468 873 730 816 838  Diastolic BP 74 75 74 68 60 60 70  Wt. (Lbs) 214 214.04 208 208.12 203 206 206  BMI 38.52 39.79 37.44 37.46 37.13 37.68 37.68

## 2019-10-07 NOTE — Assessment & Plan Note (Signed)
Hyperlipidemia:Low fat diet discussed and encouraged.   Lipid Panel  Lab Results  Component Value Date   CHOL 229 (H) 09/12/2019   HDL 60 09/12/2019   LDLCALC 142 (H) 09/12/2019   TRIG 144 09/12/2019   CHOLHDL 3.8 09/12/2019   Needs to lower fat intake

## 2019-10-07 NOTE — Assessment & Plan Note (Signed)
Short course of lasix and potassium, then as needed, also leg elevation and hose compression

## 2019-10-11 ENCOUNTER — Other Ambulatory Visit: Payer: Self-pay

## 2019-10-11 ENCOUNTER — Ambulatory Visit (INDEPENDENT_AMBULATORY_CARE_PROVIDER_SITE_OTHER): Payer: Medicare HMO | Admitting: Family Medicine

## 2019-10-11 ENCOUNTER — Encounter: Payer: Self-pay | Admitting: Family Medicine

## 2019-10-11 VITALS — BP 107/55 | HR 94 | Temp 97.7°F | Resp 16 | Ht 61.5 in | Wt 209.1 lb

## 2019-10-11 DIAGNOSIS — M545 Low back pain, unspecified: Secondary | ICD-10-CM

## 2019-10-11 DIAGNOSIS — M7989 Other specified soft tissue disorders: Secondary | ICD-10-CM

## 2019-10-11 DIAGNOSIS — I10 Essential (primary) hypertension: Secondary | ICD-10-CM | POA: Diagnosis not present

## 2019-10-11 DIAGNOSIS — G8929 Other chronic pain: Secondary | ICD-10-CM | POA: Diagnosis not present

## 2019-10-11 MED ORDER — FUROSEMIDE 20 MG PO TABS: 20.0000 mg | ORAL_TABLET | Freq: Every day | ORAL | 3 refills | Status: DC

## 2019-10-11 MED ORDER — POTASSIUM CHLORIDE ER 10 MEQ PO TBCR: 10.0000 meq | EXTENDED_RELEASE_TABLET | Freq: Two times a day (BID) | ORAL | 3 refills | Status: DC

## 2019-10-11 NOTE — Patient Instructions (Addendum)
Keep appointment in November, call if you need me before  Take lasix and potassium every day,    Swelling much improved  Please continue to move towards getting your Covid vaccines  Thanks for choosing [facility], we consider it a privelige to serve you.

## 2019-10-12 ENCOUNTER — Encounter: Payer: Self-pay | Admitting: Family Medicine

## 2019-10-12 NOTE — Assessment & Plan Note (Signed)
Controlled, no change in medication DASH diet and commitment to daily physical activity for a minimum of 30 minutes discussed and encouraged, as a part of hypertension management. The importance of attaining a healthy weight is also discussed.  BP/Weight 10/11/2019 10/04/2019 09/11/2019 03/07/2019 02/07/2019 12/22/2018 0/33/5331  Systolic BP 740 992 780 044 715 806 386  Diastolic BP 55 74 75 74 68 60 60  Wt. (Lbs) 209.08 214 214.04 208 208.12 203 206  BMI 38.87 38.52 39.79 37.44 37.46 37.13 37.68

## 2019-10-12 NOTE — Assessment & Plan Note (Signed)
Marked improvement on daily lasix , continue same with potassium supplement, esp in Summer months

## 2019-10-12 NOTE — Progress Notes (Signed)
   Victoria Reyes     MRN: [id number]      DOB: May 14, 1950   HPI Victoria Reyes is here for follow up and re-evaluation of leg swelling and hypertension. Reports marked improvement with addutiion of diuretic, but has reduced potassium to one daily, I advise her to take 2. No other concerns   ROS Denies recent fever or chills. Denies sinus pressure, nasal congestion, ear pain or sore throat. Denies chest congestion, productive cough or wheezing. Denies chest pains, palpitations and leg swelling Denies skin break down or rash.   PE  BP (!) 107/55   Pulse 94   Temp 97.7 F (36.5 C)   Resp 16   Ht 5' 1.5" (1.562 m)   Wt 209 lb 1.3 oz (94.8 kg)   SpO2 95%   BMI 38.87 kg/m   Patient alert and oriented and in no cardiopulmonary distress.  HEENT: No facial asymmetry, EOMI,     Neck supple .  Chest: Clear to auscultation bilaterally.  CVS: S1, S2 no murmurs, no S3.Regular rate.     Ext: No edema  MS: Adequate though reduced  ROM lumbar spine,normal in  shoulders, hips and knees.  Skin: Intact, no ulcerations or rash noted.  Psych: Good eye contact, normal affect. Memory intact not anxious or depressed appearing.  CNS: CN 2-12 intact, power,  normal throughout.no focal deficits noted.   Assessment & Plan  HTN (hypertension) Controlled, no change in medication DASH diet and commitment to daily physical activity for a minimum of 30 minutes discussed and encouraged, as a part of hypertension management. The importance of attaining a healthy weight is also discussed.  BP/Weight 10/11/2019 10/04/2019 09/11/2019 03/07/2019 02/07/2019 12/22/2018 1/77/9390  Systolic BP 300 923 300 762 263 335 456  Diastolic BP 55 74 75 74 68 60 60  Wt. (Lbs) 209.08 214 214.04 208 208.12 203 206  BMI 38.87 38.52 39.79 37.44 37.46 37.13 37.68       Left leg swelling Marked improvement on daily lasix , continue same with potassium supplement, esp in Summer months  Morbid obesity  ([place]) Obesity linked with hypertension and arthritis  Patient re-educated about  the importance of commitment to a  minimum of 150 minutes of exercise per week as able.  The importance of healthy food choices with portion control discussed, as well as eating regularly and within a 12 hour window most days. The need to choose "clean , green" food 50 to 75% of the time is discussed, as well as to make water the primary drink and set a goal of 64 ounces water daily.    Weight /BMI 10/11/2019 10/04/2019 09/11/2019  WEIGHT 209 lb 1.3 oz 214 lb 214 lb 0.6 oz  HEIGHT 5' 1.5" 5' 2.5" 5' 1.5"  BMI 38.87 kg/m2 38.52 kg/m2 39.79 kg/m2      Chronic back pain No recent flares, chronic and unchanged

## 2019-10-12 NOTE — Assessment & Plan Note (Signed)
Obesity linked with hypertension and arthritis  Patient re-educated about  the importance of commitment to a  minimum of 150 minutes of exercise per week as able.  The importance of healthy food choices with portion control discussed, as well as eating regularly and within a 12 hour window most days. The need to choose "clean , green" food 50 to 75% of the time is discussed, as well as to make water the primary drink and set a goal of 64 ounces water daily.    Weight /BMI 10/11/2019 10/04/2019 09/11/2019  WEIGHT 209 lb 1.3 oz 214 lb 214 lb 0.6 oz  HEIGHT 5' 1.5" 5' 2.5" 5' 1.5"  BMI 38.87 kg/m2 38.52 kg/m2 39.79 kg/m2

## 2019-10-12 NOTE — Assessment & Plan Note (Signed)
No recent flares, chronic and unchanged

## 2019-10-26 ENCOUNTER — Other Ambulatory Visit: Payer: Self-pay | Admitting: Family Medicine

## 2019-11-08 ENCOUNTER — Ambulatory Visit (INDEPENDENT_AMBULATORY_CARE_PROVIDER_SITE_OTHER): Payer: Medicare HMO | Admitting: Family Medicine

## 2019-11-08 ENCOUNTER — Encounter: Payer: Self-pay | Admitting: Family Medicine

## 2019-11-08 ENCOUNTER — Other Ambulatory Visit: Payer: Self-pay

## 2019-11-08 ENCOUNTER — Ambulatory Visit (HOSPITAL_COMMUNITY)
Admission: RE | Admit: 2019-11-08 | Discharge: 2019-11-08 | Disposition: A | Discharge status: Home / Self Care | Payer: Medicare HMO | Source: Ambulatory Visit | Attending: Family Medicine | Admitting: Family Medicine

## 2019-11-08 VITALS — BP 133/63 | HR 87 | Ht 62.5 in | Wt 210.0 lb

## 2019-11-08 DIAGNOSIS — M7989 Other specified soft tissue disorders: Secondary | ICD-10-CM

## 2019-11-08 DIAGNOSIS — I10 Essential (primary) hypertension: Secondary | ICD-10-CM | POA: Diagnosis not present

## 2019-11-08 DIAGNOSIS — M533 Sacrococcygeal disorders, not elsewhere classified: Secondary | ICD-10-CM | POA: Diagnosis not present

## 2019-11-08 DIAGNOSIS — R600 Localized edema: Secondary | ICD-10-CM | POA: Diagnosis not present

## 2019-11-08 DIAGNOSIS — M25561 Pain in right knee: Secondary | ICD-10-CM | POA: Diagnosis not present

## 2019-11-08 DIAGNOSIS — M79605 Pain in left leg: Secondary | ICD-10-CM | POA: Diagnosis not present

## 2019-11-08 MED ORDER — METHYLPREDNISOLONE ACETATE 80 MG/ML IJ SUSP
80.0000 mg | Freq: Once | INTRAMUSCULAR | Status: AC
  Administered 2019-11-08: 80 mg via INTRAMUSCULAR

## 2019-11-08 MED ORDER — KETOROLAC TROMETHAMINE 60 MG/2ML IM SOLN
60.0000 mg | Freq: Once | INTRAMUSCULAR | Status: AC
  Administered 2019-11-08: 60 mg via INTRAMUSCULAR

## 2019-11-08 MED ORDER — IBUPROFEN 600 MG PO TABS: ORAL_TABLET | ORAL | 0 refills | Status: DC

## 2019-11-08 MED ORDER — PREDNISONE 10 MG PO TABS: 10.0000 mg | ORAL_TABLET | Freq: Two times a day (BID) | ORAL | 0 refills | Status: DC

## 2019-11-08 NOTE — Progress Notes (Signed)
Victoria Reyes     MRN: [id number]      DOB: 06-02-1950   HPI [name] is here for follow up and re-evaluation of chronic medical conditions, medication management and review of any available recent lab and radiology data.  Preventive health is updated, specifically  Cancer screening and Immunization.   Questions or concerns regarding consultations or procedures which the PT has had in the interim are  addressed. The PT denies any adverse reactions to current medications since the last visit.  There are no new concerns.  There are no specific complaints   ROS Denies recent fever or chills. Denies sinus pressure, nasal congestion, ear pain or sore throat. Denies chest congestion, productive cough or wheezing. Denies chest pains, palpitations and leg swelling Denies abdominal pain, nausea, vomiting,diarrhea or constipation.   Denies dysuria, frequency, hesitancy or incontinence. Denies joint pain, swelling and limitation in mobility. Denies headaches, seizures, numbness, or tingling. Denies depression, anxiety or insomnia. Denies skin break down or rash.   PE  BP (!) 133/63   Pulse 87   Ht 5' 2.5" (1.588 m)   Wt (!) 210 lb (95.3 kg)   SpO2 97%   BMI 37.80 kg/m   Patient alert and oriented and in no cardiopulmonary distress.  HEENT: No facial asymmetry, EOMI,     Neck supple .  Chest: Clear to auscultation bilaterally.  CVS: S1, S2 no murmurs, no S3.Regular rate.  ABD: Soft non tender.   Ext: one plus edema left leg with calf tenderness  MS: decreased  ROM lumbar  spine,  and right knee, tender over right sI joint.  Skin: Intact, no ulcerations or rash noted.  Psych: Good eye contact, normal affect. Memory intact not anxious or depressed appearing.  CNS: CN 2-12 intact, power,  normal throughout.no focal deficits noted.   Assessment & Plan  Left leg swelling Persistent and no response to lasix , states she is allergic, venous doppler stat  Knee pain,  right Uncontrolled.Toradol and depo medrol administered IM in the office , to be followed by a short course of oral prednisone and NSAIDS.   Sacro ilial pain Uncontrolled.Toradol and depo medrol administered IM in the office , to be followed by a short course of oral prednisone and NSAIDS.   HTN (hypertension) Controlled, no change in medication DASH diet and commitment to daily physical activity for a minimum of 30 minutes discussed and encouraged, as a part of hypertension management. The importance of attaining a healthy weight is also discussed.  BP/Weight 11/08/2019 10/11/2019 10/04/2019 09/11/2019 03/07/2019 02/07/2019 0/81/4481  Systolic BP 856 314 970 263 785 885 027  Diastolic BP 63 55 74 75 74 68 60  Wt. (Lbs) 210 209.08 214 214.04 208 208.12 203  BMI 37.8 38.87 38.52 39.79 37.44 37.46 37.13       Morbid obesity (HCC) Obesity linked with hTN and arthritis  Patient re-educated about  the importance of commitment to a  minimum of 150 minutes of exercise per week as able.  The importance of healthy food choices with portion control discussed, as well as eating regularly and within a 12 hour window most days. The need to choose "clean , green" food 50 to 75% of the time is discussed, as well as to make water the primary drink and set a goal of 64 ounces water daily.    Weight /BMI 11/08/2019 10/11/2019 10/04/2019  WEIGHT 210 lb 209 lb 1.3 oz 214 lb  HEIGHT 5' 2.5" 5' 1.5"  5' 2.5"  BMI 37.8 kg/m2 38.87 kg/m2 38.52 kg/m2

## 2019-11-08 NOTE — Assessment & Plan Note (Addendum)
Persistent and no response to lasix , states she is allergic, venous doppler stat

## 2019-11-08 NOTE — Patient Instructions (Addendum)
Follow-up as before call if you need me sooner.  Toradol 60 mg IM and Depo-Medrol 80 mg IM in the office today for acute right knee and sacroiliac pain.  5-day course of ibuprofen and prednisone is also prescribed.  Ultrasound of left leg today to evaluate chronic.  Pain and swelling and blue nodes or clots.  If this is negative you will be referred to vascular surgery for further evaluation of chronic left leg swelling.  1200 and   1500-calorie diet sheets will be provided at visit.  It is important for you to continue to work persistently at weight loss to improve your health.  It is important that you exercise regularly at least 30 minutes 5 times a week. If you develop chest pain, have severe difficulty breathing, or feel very tired, stop exercising immediately and seek medical attention    Think about what you will eat, plan ahead. Choose " clean, green, fresh or frozen" over canned, processed or packaged foods which are more sugary, salty and fatty. 70 to 75% of food eaten should be vegetables and fruit. Three meals at set times with snacks allowed between meals, but they must be fruit or vegetables. Aim to eat over a 12 hour period , example 7 am to 7 pm, and STOP after  your last meal of the day. Drink water,generally about 64 ounces per day, no other drink is as healthy. Fruit juice is best enjoyed in a healthy way, by EATING the fruit. Thanks for choosing [facility], we consider it a privelige to serve you.

## 2019-11-10 ENCOUNTER — Encounter: Payer: Self-pay | Admitting: Family Medicine

## 2019-11-10 NOTE — Assessment & Plan Note (Signed)
Obesity linked with hTN and arthritis  Patient re-educated about  the importance of commitment to a  minimum of 150 minutes of exercise per week as able.  The importance of healthy food choices with portion control discussed, as well as eating regularly and within a 12 hour window most days. The need to choose "clean , green" food 50 to 75% of the time is discussed, as well as to make water the primary drink and set a goal of 64 ounces water daily.    Weight /BMI 11/08/2019 10/11/2019 10/04/2019  WEIGHT 210 lb 209 lb 1.3 oz 214 lb  HEIGHT 5' 2.5" 5' 1.5" 5' 2.5"  BMI 37.8 kg/m2 38.87 kg/m2 38.52 kg/m2

## 2019-11-10 NOTE — Assessment & Plan Note (Signed)
Controlled, no change in medication DASH diet and commitment to daily physical activity for a minimum of 30 minutes discussed and encouraged, as a part of hypertension management. The importance of attaining a healthy weight is also discussed.  BP/Weight 11/08/2019 10/11/2019 10/04/2019 09/11/2019 03/07/2019 02/07/2019 4/85/4627  Systolic BP 035 009 381 829 937 169 678  Diastolic BP 63 55 74 75 74 68 60  Wt. (Lbs) 210 209.08 214 214.04 208 208.12 203  BMI 37.8 38.87 38.52 39.79 37.44 37.46 37.13

## 2019-11-10 NOTE — Assessment & Plan Note (Signed)
Uncontrolled.Toradol and depo medrol administered IM in the office , to be followed by a short course of oral prednisone and NSAIDS.  

## 2019-12-05 ENCOUNTER — Telehealth: Payer: Self-pay

## 2019-12-05 NOTE — Telephone Encounter (Signed)
Either Moderna or Pfizerare the vaccines I recommend

## 2019-12-05 NOTE — Telephone Encounter (Signed)
Pt called wanted Dr. Griffin Dakin advice on which COVID vac to get because she knows pt medical history

## 2019-12-06 NOTE — Telephone Encounter (Signed)
Lvm with dr simpsons recommendation

## 2019-12-06 NOTE — Telephone Encounter (Signed)
I left pt a voicemail that dr simpson recommends both the moderna or the pfizer vaccine. I have called her twice with no answer. Please let her know if she calls back again

## 2019-12-06 NOTE — Telephone Encounter (Signed)
Pt is returning call.  

## 2019-12-06 NOTE — Telephone Encounter (Signed)
LVM for pt to call the office.

## 2019-12-24 ENCOUNTER — Other Ambulatory Visit (HOSPITAL_COMMUNITY): Payer: Self-pay | Admitting: Family Medicine

## 2019-12-24 DIAGNOSIS — Z1231 Encounter for screening mammogram for malignant neoplasm of breast: Secondary | ICD-10-CM

## 2019-12-25 ENCOUNTER — Telehealth: Payer: Self-pay | Admitting: Family Medicine

## 2019-12-25 ENCOUNTER — Encounter: Payer: Medicare HMO | Admitting: Family Medicine

## 2019-12-25 NOTE — Telephone Encounter (Signed)
Aware she can take claritin or zyrtec and saline nasal spray and that with no fever she should be fine to get the 2nd vaccine

## 2019-12-25 NOTE — Telephone Encounter (Signed)
Pt called and said her granddaughter has given her a cold, and she goes tomorrow for her 2nd covid vaccine and she would like to know what she can take over the counter.

## 2020-01-11 DIAGNOSIS — A048 Other specified bacterial intestinal infections: Secondary | ICD-10-CM

## 2020-01-11 HISTORY — DX: Other specified bacterial intestinal infections: A04.8

## 2020-01-14 ENCOUNTER — Telehealth: Payer: Medicare HMO

## 2020-01-15 ENCOUNTER — Other Ambulatory Visit: Payer: Self-pay | Admitting: Family Medicine

## 2020-01-16 ENCOUNTER — Telehealth: Payer: Self-pay

## 2020-01-16 NOTE — Telephone Encounter (Signed)
Called to confirm the appt, and pt wanted to know if there was some pain medication she could take for the pain when going to bed, the gabapentin didn't work

## 2020-01-16 NOTE — Telephone Encounter (Signed)
I recommend ES tylenol (OTC)one at bedtime ,

## 2020-01-17 ENCOUNTER — Other Ambulatory Visit: Payer: Self-pay

## 2020-01-17 ENCOUNTER — Telehealth (INDEPENDENT_AMBULATORY_CARE_PROVIDER_SITE_OTHER): Payer: Medicare HMO | Admitting: Family Medicine

## 2020-01-17 ENCOUNTER — Encounter: Payer: Self-pay | Admitting: Family Medicine

## 2020-01-17 VITALS — BP 148/74 | Ht 63.0 in | Wt 210.0 lb

## 2020-01-17 DIAGNOSIS — Z0000 Encounter for general adult medical examination without abnormal findings: Secondary | ICD-10-CM | POA: Diagnosis not present

## 2020-01-17 NOTE — Patient Instructions (Addendum)
Victoria Reyes , Thank you for taking time to come for your Medicare Wellness Visit. I appreciate your ongoing commitment to your health goals. Please review the following plan we discussed and let me know if I can assist you in the future.   Please continue to practice social distancing to keep you, your family, and our community safe.  If you must go out, please wear a Mask and practice good handwashing.  Screening recommendations/referrals: Colonoscopy: up to date Mammogram: coming up Bone Density: up todate Recommended yearly ophthalmology/optometry visit for glaucoma screening and checkup Recommended yearly dental visit for hygiene and checkup  Vaccinations: Influenza vaccine: declined Pneumococcal vaccine: declined Tdap vaccine: declined Shingles vaccine:  Declined CoVID: completed  Advanced directives: completed  Conditions/risks identified: Falls   Next appointment: 03/10/2020 as scheduled    Preventive Care 40 Years and Older, Female Preventive care refers to lifestyle choices and visits with your health care provider that can promote health and wellness. What does preventive care include?  A yearly physical exam. This is also called an annual well check.  Dental exams once or twice a year.  Routine eye exams. Ask your health care provider how often you should have your eyes checked.  Personal lifestyle choices, including:  Daily care of your teeth and gums.  Regular physical activity.  Eating a healthy diet.  Avoiding tobacco and drug use.  Limiting alcohol use.  Practicing safe sex.  Taking low-dose aspirin every day.  Taking vitamin and mineral supplements as recommended by your health care provider. What happens during an annual well check? The services and screenings done by your health care provider during your annual well check will depend on your age, overall health, lifestyle risk factors, and family history of disease. Counseling  Your health  care provider may ask you questions about your:  Alcohol use.  Tobacco use.  Drug use.  Emotional well-being.  Home and relationship well-being.  Sexual activity.  Eating habits.  History of falls.  Memory and ability to understand (cognition).  Work and work Statistician.  Reproductive health. Screening  You may have the following tests or measurements:  Height, weight, and BMI.  Blood pressure.  Lipid and cholesterol levels. These may be checked every 5 years, or more frequently if you are over 38 years old.  Skin check.  Lung cancer screening. You may have this screening every year starting at age 23 if you have a 30-pack-year history of smoking and currently smoke or have quit within the past 15 years.  Fecal occult blood test (FOBT) of the stool. You may have this test every year starting at age 3.  Flexible sigmoidoscopy or colonoscopy. You may have a sigmoidoscopy every 5 years or a colonoscopy every 10 years starting at age 5.  Hepatitis C blood test.  Hepatitis B blood test.  Sexually transmitted disease (STD) testing.  Diabetes screening. This is done by checking your blood sugar (glucose) after you have not eaten for a while (fasting). You may have this done every 1-3 years.  Bone density scan. This is done to screen for osteoporosis. You may have this done starting at age 56.  Mammogram. This may be done every 1-2 years. Talk to your health care provider about how often you should have regular mammograms. Talk with your health care provider about your test results, treatment options, and if necessary, the need for more tests. Vaccines  Your health care provider may recommend certain vaccines, such as:  Influenza vaccine. This  is recommended every year.  Tetanus, diphtheria, and acellular pertussis (Tdap, Td) vaccine. You may need a Td booster every 10 years.  Zoster vaccine. You may need this after age 68.  Pneumococcal 13-valent conjugate  (PCV13) vaccine. One dose is recommended after age 74.  Pneumococcal polysaccharide (PPSV23) vaccine. One dose is recommended after age 10. Talk to your health care provider about which screenings and vaccines you need and how often you need them. This information is not intended to replace advice given to you by your health care provider. Make sure you discuss any questions you have with your health care provider. Document Released: 04/25/2015 Document Revised: 12/17/2015 Document Reviewed: 01/28/2015 Elsevier Interactive Patient Education  2017 [employer] Prevention in the Home Falls can cause injuries. They can happen to people of all ages. There are many things you can do to make your home safe and to help prevent falls. What can I do on the outside of my home?  Regularly fix the edges of walkways and driveways and fix any cracks.  Remove anything that might make you trip as you walk through a door, such as a raised step or threshold.  Trim any bushes or trees on the path to your home.  Use bright outdoor lighting.  Clear any walking paths of anything that might make someone trip, such as rocks or tools.  Regularly check to see if handrails are loose or broken. Make sure that both sides of any steps have handrails.  Any raised decks and porches should have guardrails on the edges.  Have any leaves, snow, or ice cleared regularly.  Use sand or salt on walking paths during winter.  Clean up any spills in your garage right away. This includes oil or grease spills. What can I do in the bathroom?  Use night lights.  Install grab bars by the toilet and in the tub and shower. Do not use towel bars as grab bars.  Use non-skid mats or decals in the tub or shower.  If you need to sit down in the shower, use a plastic, non-slip stool.  Keep the floor dry. Clean up any water that spills on the floor as soon as it happens.  Remove soap buildup in the tub or shower  regularly.  Attach bath mats securely with double-sided non-slip rug tape.  Do not have throw rugs and other things on the floor that can make you trip. What can I do in the bedroom?  Use night lights.  Make sure that you have a light by your bed that is easy to reach.  Do not use any sheets or blankets that are too big for your bed. They should not hang down onto the floor.  Have a firm chair that has side arms. You can use this for support while you get dressed.  Do not have throw rugs and other things on the floor that can make you trip. What can I do in the kitchen?  Clean up any spills right away.  Avoid walking on wet floors.  Keep items that you use a lot in easy-to-reach places.  If you need to reach something above you, use a strong step stool that has a grab bar.  Keep electrical cords out of the way.  Do not use floor polish or wax that makes floors slippery. If you must use wax, use non-skid floor wax.  Do not have throw rugs and other things on the floor that can make you  trip. What can I do with my stairs?  Do not leave any items on the stairs.  Make sure that there are handrails on both sides of the stairs and use them. Fix handrails that are broken or loose. Make sure that handrails are as long as the stairways.  Check any carpeting to make sure that it is firmly attached to the stairs. Fix any carpet that is loose or worn.  Avoid having throw rugs at the top or bottom of the stairs. If you do have throw rugs, attach them to the floor with carpet tape.  Make sure that you have a light switch at the top of the stairs and the bottom of the stairs. If you do not have them, ask someone to add them for you. What else can I do to help prevent falls?  Wear shoes that:  Do not have high heels.  Have rubber bottoms.  Are comfortable and fit you well.  Are closed at the toe. Do not wear sandals.  If you use a stepladder:  Make sure that it is fully  opened. Do not climb a closed stepladder.  Make sure that both sides of the stepladder are locked into place.  Ask someone to hold it for you, if possible.  Clearly mark and make sure that you can see:  Any grab bars or handrails.  First and last steps.  Where the edge of each step is.  Use tools that help you move around (mobility aids) if they are needed. These include:  Canes.  Walkers.  Scooters.  Crutches.  Turn on the lights when you go into a dark area. Replace any light bulbs as soon as they burn out.  Set up your furniture so you have a clear path. Avoid moving your furniture around.  If any of your floors are uneven, fix them.  If there are any pets around you, be aware of where they are.  Review your medicines with your doctor. Some medicines can make you feel dizzy. This can increase your chance of falling. Ask your doctor what other things that you can do to help prevent falls. This information is not intended to replace advice given to you by your health care provider. Make sure you discuss any questions you have with your health care provider. Document Released: 01/23/2009 Document Revised: 09/04/2015 Document Reviewed: 05/03/2014 Elsevier Interactive Patient Education  2017 [employer].

## 2020-01-17 NOTE — Progress Notes (Signed)
Subjective:   Victoria Reyes is a 69 y.o. female who presents for Medicare Annual (Subsequent) preventive examination.  Method of visit: Telephone  Location of Patient: Home Location of Provider: Office Consent was obtain for visit over the telephone. Services rendered by provider: Visit was performed via telephone  I verified that I am speaking with the correct person using two identifiers.  Review of Systems     Cardiac Risk Factors include: none     Objective:    Today's Vitals   01/17/20 1133 01/17/20 1135  BP: (!) 148/74   Weight: 210 lb (95.3 kg)   Height: 5\' 3"  (1.6 m)   PainSc: 10-Worst pain ever 10-Worst pain ever  PainLoc: Hip    Body mass index is 37.2 kg/m.  Advanced Directives 01/17/2020 12/19/2017 01/12/2017 12/15/2016 10/18/2016 12/09/2015  Does Patient Have a Medical Advance Directive? Yes No Yes Yes Yes Yes  Type of Advance Directive Living will - Living will Living will Living will Living will  Does patient want to make changes to medical advance directive? No - Patient declined Yes (ED - Information included in AVS) Yes (Inpatient - patient defers changing a medical advance directive at this time) No - Patient declined Yes (MAU/Ambulatory/Procedural Areas - Information given) -  Copy of [place] in Chart? - - - - - No - copy requested  Would patient like information on creating a medical advance directive? Yes (MAU/Ambulatory/Procedural Areas - Information given) - - - - -    Current Medications (verified) Outpatient Encounter Medications as of 01/17/2020  Medication Sig   amLODipine (NORVASC) 5 MG tablet TAKE 1 TABLET BY MOUTH EVERY DAY   atorvastatin (LIPITOR) 40 MG tablet Take 1 tablet (40 mg total) by mouth daily.   levothyroxine (SYNTHROID) 100 MCG tablet Take 1 tablet (100 mcg total) by mouth daily.   omeprazole (PRILOSEC) 40 MG capsule TAKE 1 CAPSULE BY MOUTH EVERY DAY   [DISCONTINUED] ibuprofen (ADVIL) 600 MG tablet Take  one tablet by mouth three times daily for 5 days, then stop (Patient not taking: Reported on 01/17/2020)   [DISCONTINUED] predniSONE (DELTASONE) 10 MG tablet Take 1 tablet (10 mg total) by mouth 2 (two) times daily with a meal. (Patient not taking: Reported on 01/17/2020)   No facility-administered encounter medications on file as of 01/17/2020.    Allergies (verified) Hydrocodone and Sulfa antibiotics   History: Past Medical History:  Diagnosis Date   Anemia    as a  young woman   Diverticulitis 2005   CT    DJD (degenerative joint disease)    GERD (gastroesophageal reflux disease)    Headache(784.0)    Hiatal hernia    Hyperlipidemia    Hypertension    Hypothyroidism    Knee pain, right    Low back pain    Neuropathy    peroneal nerve   Obesity    Pneumonia    as child   Pre-diabetes    S/P colonoscopy 2005   Dr. Tamala Julian: sigmoid diverticulosis   Thyroid disease    Tobacco abuse    Past Surgical History:  Procedure Laterality Date   BACK SURGERY     COLONOSCOPY  12/18/2010   Procedure: COLONOSCOPY;  Surgeon: Dorothyann Peng, MD;  Location[facility];  Service: Endoscopy;  Laterality: N/A;  10:15am   cyst removal right wrist     EYE SURGERY Bilateral    cataract surgery with lens implant   TONSILLECTOMY  TOTAL SHOULDER ARTHROPLASTY Left 12/21/2016   Procedure: TOTAL SHOULDER ARTHROPLASTY;  Surgeon: Meredith Pel, MD;  Location: [facility];  Service: Orthopedics;  Laterality: Left;   TUBAL LIGATION     umbillical hernia     Family History  Problem Relation Age of Onset   Heart attack Sister        pacemaker   Kidney failure Sister        on dialysis   Colon cancer Neg Hx    Social History   Socioeconomic History   Marital status: Widowed    Spouse name: Not on file   Number of children: Not on file   Years of education: 12   Highest education level: Some college, no degree  Occupational History   Not on file    Tobacco Use   Smoking status: Former Smoker    Packs/day: 0.25    Years: 45.00    Pack years: 11.25    Types: Cigarettes    Start date: 09/30/1963    Quit date: 08/15/2015    Years since quitting: 4.4   Smokeless tobacco: Never Used  Vaping Use   Vaping Use: Never used  Substance and Sexual Activity   Alcohol use: No    Alcohol/week: 0.0 standard drinks   Drug use: No   Sexual activity: Not Currently    Birth control/protection: Post-menopausal  Other Topics Concern   Not on file  Social History Narrative   Not on file   Social Determinants of Health   Financial Resource Strain: Low Risk    Difficulty of Paying Living Expenses: Not hard at all  Food Insecurity: No Food Insecurity   Worried About Charity fundraiser in the Last Year: Never true   [place] in the Last Year: Never true  Transportation Needs: No Transportation Needs   Lack of Transportation (Medical): No   Lack of Transportation (Non-Medical): No  Physical Activity: Insufficiently Active   Days of Exercise per Week: 3 days   Minutes of Exercise per Session: 30 min  Stress: No Stress Concern Present   Feeling of Stress : Not at all  Social Connections: Socially Isolated   Frequency of Communication with Friends and Family: More than three times a week   Frequency of Social Gatherings with Friends and Family: More than three times a week   Attends Religious Services: Never   Marine scientist or Organizations: No   Attends Archivist Meetings: Never   Marital Status: Widowed    Tobacco Counseling Counseling given: Not Answered   Clinical Intake:  Pre-visit preparation completed: Yes  Pain : 0-10 Pain Score: 10-Worst pain ever Faces Pain Scale: Hurts worst Pain Type: Chronic pain Pain Location: Hip Pain Orientation: Mid Pain Descriptors / Indicators: Aching Pain Onset: In the past 7 days Pain Frequency: Constant Pain Relieving Factors: aleve Effect  of Pain on Daily Activities: cant sleep at night keepiong her awake  Faces Pain Scale: Hurts worst Pain Relieving Factors: aleve  BMI - recorded: 37.8 Nutritional Status: BMI > 30  Obese Nutritional Risks: None Diabetes: No  How often do you need to have someone help you when you read instructions, pamphlets, or other written materials from your doctor or pharmacy?: 1 - Never What is the last grade level you completed in school?: 12  Diabetic?no  Interpreter Needed?: No      Activities of Daily Living In your present state of health, do you have any difficulty performing  the following activities: 01/17/2020  Hearing? N  Vision? N  Difficulty concentrating or making decisions? N  Walking or climbing stairs? N  Dressing or bathing? N  Doing errands, shopping? N  Preparing Food and eating ? N  Using the Toilet? N  In the past six months, have you accidently leaked urine? N  Do you have problems with loss of bowel control? N  Managing your Medications? N  Managing your Finances? N  Housekeeping or managing your Housekeeping? N  Some recent data might be hidden    Patient Care Team: Fayrene Helper, MD as PCP - General (Family Medicine) Gala Romney, Cristopher Estimable, MD (Gastroenterology)  Indicate any recent Medical Services you may have received from other than [facility] providers in the past year (date may be approximate).     Assessment:   This is a routine wellness examination for Markan.  Hearing/Vision screen No exam data present  Dietary issues and exercise activities discussed: Current Exercise Habits: Home exercise routine, Type of exercise: walking, Time (Minutes): 30, Frequency (Times/Week): 3, Weekly Exercise (Minutes/Week): 90, Intensity: Mild, Exercise limited by: None identified  Goals     Increase water intake     Reduce fat intake     Recommend decreasing the amount of fried/high fatty foods.      Weight (lb) < 200 lb (90.7 kg)     Would like to lose 50lbs        Depression Screen PHQ 2/9 Scores 01/17/2020 01/17/2020 10/11/2019 10/04/2019 09/11/2019 02/07/2019 12/22/2018  PHQ - 2 Score 0 0 0 0 0 0 0  PHQ- 9 Score - - - - - - -    Fall Risk Fall Risk  01/17/2020 11/08/2019 10/11/2019 10/04/2019 10/04/2019  Falls in the past year? 0 0 0 1 0  Number falls in past yr: 0 0 - 0 -  Injury with Fall? 0 0 - 0 -  Risk for fall due to : No Fall Risks - - - -  Follow up Falls evaluation completed - - - -  Comment - - - - -    Any stairs in or around the home? No  If so, are there any without handrails? No  Home free of loose throw rugs in walkways, pet beds, electrical cords, etc? No  Adequate lighting in your home to reduce risk of falls? No   ASSISTIVE DEVICES UTILIZED TO PREVENT FALLS:  Life alert? No  Use of a cane, walker or w/c? No  Grab bars in the bathroom? No  Shower chair or bench in shower? No  Elevated toilet seat or a handicapped toilet? No   TIMED UP AND GO:  Was the test performed? No .  Length of time to ambulate 10 feet: NA sec.     Cognitive Function:     6CIT Screen 01/17/2020 12/22/2018 12/19/2017 10/18/2016  What Year? 0 points 0 points 0 points 0 points  What month? 0 points 0 points 0 points 0 points  What time? 0 points 0 points 0 points 0 points  Count back from 20 0 points 0 points 0 points 0 points  Months in reverse 0 points 0 points 0 points 0 points  Repeat phrase 0 points 0 points 0 points 0 points  Total Score 0 0 0 0    Immunizations There is no immunization history for the selected administration types on file for this patient.  TDAP status: Up to date Flu Vaccine status: Declined, Education has been provided regarding the  importance of this vaccine but patient still declined. Advised may receive this vaccine at local pharmacy or Health Dept. Aware to provide a copy of the vaccination record if obtained from local pharmacy or Health Dept. Verbalized acceptance and understanding. Pneumococcal vaccine status:  Declined,  Education has been provided regarding the importance of this vaccine but patient still declined. Advised may receive this vaccine at local pharmacy or Health Dept. Aware to provide a copy of the vaccination record if obtained from local pharmacy or Health Dept. Verbalized acceptance and understanding.  Covid-19 vaccine status: Completed vaccines  Qualifies for Shingles Vaccine? No   Zostavax completed No   Shingrix Completed?: No.    Education has been provided regarding the importance of this vaccine. Patient has been advised to call insurance company to determine out of pocket expense if they have not yet received this vaccine. Advised may also receive vaccine at local pharmacy or Health Dept. Verbalized acceptance and understanding.  Screening Tests Health Maintenance  Topic Date Due   TETANUS/TDAP  Never done   PNA vac Low Risk Adult (1 of 2 - PCV13) Never done   INFLUENZA VACCINE  Never done   COVID-19 Vaccine (1) 10/19/2020 (Originally 10/05/1962)   MAMMOGRAM  11/09/2020   COLONOSCOPY  12/30/2020   DEXA SCAN  Completed   Hepatitis C Screening  Completed    Health Maintenance  Health Maintenance Due  Topic Date Due   TETANUS/TDAP  Never done   PNA vac Low Risk Adult (1 of 2 - PCV13) Never done   INFLUENZA VACCINE  Never done    Colorectal cancer screening: Completed 12-31-2010. Repeat every 10 years Mammogram status: Completed 11-10-18. Repeat every year Bone Density status: Completed 12-12-19. Results reflect: Bone density results: NORMAL. Repeat every 5 years.  Lung Cancer Screening: (Low Dose CT Chest recommended if Age 77-80 years, 30 pack-year currently smoking OR have quit w/in 15years.) does qualify.   Lung Cancer Screening Referral: yes  Additional Screening:  Hepatitis C Screening: does not qualify; Completed yes  Vision Screening: Recommended annual ophthalmology exams for early detection of glaucoma and other disorders of the eye. Is the  patient up to date with their annual eye exam?  No  Who is the provider or what is the name of the office in which the patient attends annual eye exams? [facility] If pt is not established with a provider, would they like to be referred to a provider to establish care? No .   Dental Screening: Recommended annual dental exams for proper oral hygiene  Community Resource Referral / Chronic Care Management: CRR required this visit?  No   CCM required this visit?  No      Plan:     1. Encounter for Medicare annual wellness exam   I have personally reviewed and noted the following in the patients chart:    Medical and social history  Use of alcohol, tobacco or illicit drugs   Current medications and supplements  Functional ability and status  Nutritional status  Physical activity  Advanced directives  List of other physicians  Hospitalizations, surgeries, and ER visits in previous 12 months  Vitals  Screenings to include cognitive, depression, and falls  Referrals and appointments  In addition, I have reviewed and discussed with patient certain preventive protocols, quality metrics, and best practice recommendations. A written personalized care plan for preventive services as well as general preventive health recommendations were provided to patient.     Perlie Mayo, NP  01/17/2020    I provided 20 minutes of non-face-to-face time during this encounter.

## 2020-01-18 NOTE — Telephone Encounter (Signed)
Patient aware of treatment plan 

## 2020-01-28 ENCOUNTER — Other Ambulatory Visit: Payer: Self-pay

## 2020-01-28 ENCOUNTER — Encounter (HOSPITAL_COMMUNITY): Payer: Self-pay | Admitting: *Deleted

## 2020-01-28 ENCOUNTER — Inpatient Hospital Stay (HOSPITAL_COMMUNITY)
Admission: EM | Admit: 2020-01-28 | Discharge: 2020-01-31 | DRG: 379 | Disposition: A | Discharge status: Home / Self Care | Payer: Medicare HMO | Attending: Internal Medicine | Admitting: Internal Medicine

## 2020-01-28 DIAGNOSIS — K922 Gastrointestinal hemorrhage, unspecified: Secondary | ICD-10-CM

## 2020-01-28 DIAGNOSIS — Z882 Allergy status to sulfonamides status: Secondary | ICD-10-CM

## 2020-01-28 DIAGNOSIS — Z20822 Contact with and (suspected) exposure to covid-19: Secondary | ICD-10-CM | POA: Diagnosis not present

## 2020-01-28 DIAGNOSIS — E669 Obesity, unspecified: Secondary | ICD-10-CM | POA: Diagnosis present

## 2020-01-28 DIAGNOSIS — K5730 Diverticulosis of large intestine without perforation or abscess without bleeding: Secondary | ICD-10-CM | POA: Diagnosis present

## 2020-01-28 DIAGNOSIS — Z885 Allergy status to narcotic agent status: Secondary | ICD-10-CM | POA: Diagnosis not present

## 2020-01-28 DIAGNOSIS — K2970 Gastritis, unspecified, without bleeding: Secondary | ICD-10-CM | POA: Diagnosis present

## 2020-01-28 DIAGNOSIS — Z79890 Hormone replacement therapy (postmenopausal): Secondary | ICD-10-CM | POA: Diagnosis not present

## 2020-01-28 DIAGNOSIS — K449 Diaphragmatic hernia without obstruction or gangrene: Secondary | ICD-10-CM | POA: Diagnosis present

## 2020-01-28 DIAGNOSIS — D649 Anemia, unspecified: Secondary | ICD-10-CM | POA: Diagnosis present

## 2020-01-28 DIAGNOSIS — Z87891 Personal history of nicotine dependence: Secondary | ICD-10-CM | POA: Diagnosis not present

## 2020-01-28 DIAGNOSIS — K264 Chronic or unspecified duodenal ulcer with hemorrhage: Secondary | ICD-10-CM | POA: Diagnosis not present

## 2020-01-28 DIAGNOSIS — R000 Tachycardia, unspecified: Secondary | ICD-10-CM | POA: Diagnosis not present

## 2020-01-28 DIAGNOSIS — Z96612 Presence of left artificial shoulder joint: Secondary | ICD-10-CM | POA: Diagnosis present

## 2020-01-28 DIAGNOSIS — E876 Hypokalemia: Secondary | ICD-10-CM | POA: Diagnosis present

## 2020-01-28 DIAGNOSIS — T39395A Adverse effect of other nonsteroidal anti-inflammatory drugs [NSAID], initial encounter: Secondary | ICD-10-CM | POA: Diagnosis present

## 2020-01-28 DIAGNOSIS — E039 Hypothyroidism, unspecified: Secondary | ICD-10-CM | POA: Diagnosis not present

## 2020-01-28 DIAGNOSIS — G629 Polyneuropathy, unspecified: Secondary | ICD-10-CM | POA: Diagnosis present

## 2020-01-28 DIAGNOSIS — I951 Orthostatic hypotension: Secondary | ICD-10-CM | POA: Diagnosis not present

## 2020-01-28 DIAGNOSIS — K269 Duodenal ulcer, unspecified as acute or chronic, without hemorrhage or perforation: Secondary | ICD-10-CM | POA: Diagnosis present

## 2020-01-28 DIAGNOSIS — B9681 Helicobacter pylori [H. pylori] as the cause of diseases classified elsewhere: Secondary | ICD-10-CM | POA: Diagnosis present

## 2020-01-28 DIAGNOSIS — E785 Hyperlipidemia, unspecified: Secondary | ICD-10-CM | POA: Diagnosis not present

## 2020-01-28 DIAGNOSIS — G8929 Other chronic pain: Secondary | ICD-10-CM | POA: Diagnosis present

## 2020-01-28 DIAGNOSIS — Z79899 Other long term (current) drug therapy: Secondary | ICD-10-CM | POA: Diagnosis not present

## 2020-01-28 DIAGNOSIS — R350 Frequency of micturition: Secondary | ICD-10-CM | POA: Diagnosis present

## 2020-01-28 DIAGNOSIS — K219 Gastro-esophageal reflux disease without esophagitis: Secondary | ICD-10-CM | POA: Diagnosis not present

## 2020-01-28 DIAGNOSIS — I10 Essential (primary) hypertension: Secondary | ICD-10-CM | POA: Diagnosis present

## 2020-01-28 DIAGNOSIS — K921 Melena: Secondary | ICD-10-CM | POA: Diagnosis present

## 2020-01-28 DIAGNOSIS — K259 Gastric ulcer, unspecified as acute or chronic, without hemorrhage or perforation: Secondary | ICD-10-CM | POA: Diagnosis not present

## 2020-01-28 DIAGNOSIS — Z6838 Body mass index (BMI) 38.0-38.9, adult: Secondary | ICD-10-CM

## 2020-01-28 DIAGNOSIS — R7302 Impaired glucose tolerance (oral): Secondary | ICD-10-CM | POA: Diagnosis present

## 2020-01-28 LAB — COMPREHENSIVE METABOLIC PANEL
ALT: 12 U/L (ref 0–44)
AST: 16 U/L (ref 15–41)
Albumin: 3.8 g/dL (ref 3.5–5.0)
Alkaline Phosphatase: 107 U/L (ref 38–126)
Anion gap: 13 (ref 5–15)
BUN: 23 mg/dL (ref 8–23)
CO2: 23 mmol/L (ref 22–32)
Calcium: 9.1 mg/dL (ref 8.9–10.3)
Chloride: 104 mmol/L (ref 98–111)
Creatinine, Ser: 0.7 mg/dL (ref 0.44–1.00)
GFR, Estimated: >60 mL/min (ref >60)
Glucose, Bld: 105 mg/dL — ABNORMAL HIGH (ref 70–99)
Potassium: 3.4 mmol/L — ABNORMAL LOW (ref 3.5–5.1)
Sodium: 140 mmol/L (ref 135–145)
Total Bilirubin: 0.7 mg/dL (ref 0.3–1.2)
Total Protein: 7.3 g/dL (ref 6.5–8.1)

## 2020-01-28 LAB — URINALYSIS, ROUTINE W REFLEX MICROSCOPIC
Bilirubin Urine: NEGATIVE
Glucose, UA: NEGATIVE mg/dL
Hgb urine dipstick: NEGATIVE
Ketones, ur: NEGATIVE mg/dL
Leukocytes,Ua: NEGATIVE
Nitrite: NEGATIVE
Protein, ur: NEGATIVE mg/dL
Specific Gravity, Urine: 1.015 (ref 1.005–1.030)
pH: 5 (ref 5.0–8.0)

## 2020-01-28 LAB — CBC
HCT: 39.6 % (ref 36.0–46.0)
Hemoglobin: 12.5 g/dL (ref 12.0–15.0)
MCH: 27.4 pg (ref 26.0–34.0)
MCHC: 31.6 g/dL (ref 30.0–36.0)
MCV: 86.8 fL (ref 80.0–100.0)
Platelets: 185 10*3/uL (ref 150–400)
RBC: 4.56 MIL/uL (ref 3.87–5.11)
RDW: 14.2 % (ref 11.5–15.5)
WBC: 14 10*3/uL — ABNORMAL HIGH (ref 4.0–10.5)
nRBC: 0 % (ref 0.0–0.2)

## 2020-01-28 LAB — POC OCCULT BLOOD, ED: Fecal Occult Bld: POSITIVE — AB

## 2020-01-28 LAB — RESPIRATORY PANEL BY RT PCR (FLU A&B, COVID)
Influenza A by PCR: NEGATIVE
Influenza B by PCR: NEGATIVE
SARS Coronavirus 2 by RT PCR: NEGATIVE

## 2020-01-28 LAB — TYPE AND SCREEN
ABO/RH(D): O POS
Antibody Screen: NEGATIVE

## 2020-01-28 MED ORDER — LACTATED RINGERS IV BOLUS
1000.0000 mL | Freq: Once | INTRAVENOUS | Status: AC
  Administered 2020-01-28: 1000 mL via INTRAVENOUS

## 2020-01-28 MED ORDER — PANTOPRAZOLE SODIUM 40 MG IV SOLR
40.0000 mg | Freq: Two times a day (BID) | INTRAVENOUS | Status: DC
  Administered 2020-01-28 – 2020-01-31 (×5): 40 mg via INTRAVENOUS
  Filled 2020-01-28 (×6): qty 40

## 2020-01-28 NOTE — ED Provider Notes (Signed)
[facility] EMERGENCY DEPARTMENT Provider Note   CSN: [id number] Arrival date & time: 01/28/20  2049     History Chief Complaint  Patient presents with  . Rectal Bleeding    Victoria Reyes is a 69 y.o. female.  HPI 70 year old female presents with concern for rectal bleeding.  She states she takes ibuprofen every other day or so for chronic back issues.  Most recently took last night.  This morning she had a black stool.  This evening she had a softer stool that looked like dark blood.  She has no abdominal plain, vomiting, lightheadedness, shortness of breath or chest pain.  She is not on blood thinners.  She has been diagnosed with diverticulosis before.   Past Medical History:  Diagnosis Date  . Anemia    as a  young woman  . Diverticulitis 2005   CT   . DJD (degenerative joint disease)   . GERD (gastroesophageal reflux disease)   . Headache(784.0)   . Hiatal hernia   . Hyperlipidemia   . Hypertension   . Hypothyroidism   . Knee pain, right   . Low back pain   . Neuropathy    peroneal nerve  . Obesity   . Pneumonia    as child  . Pre-diabetes   . S/P colonoscopy 2005   Dr. Tamala Julian: sigmoid diverticulosis  . Thyroid disease   . Tobacco abuse     Patient Active Problem List   Diagnosis Date Noted  . Knee pain, right 11/08/2019  . Sacro ilial pain 11/08/2019  . Chronic back pain 09/11/2019  . Immunization refused 03/11/2019  . Cutaneous wart 09/30/2014  . Left leg swelling 08/15/2013  . HTN (hypertension) 01/01/2013  . Hyperlipidemia with target LDL less than 100 05/04/2012  . IGT (impaired glucose tolerance) 03/14/2011  . GERD (gastroesophageal reflux disease) 03/14/2011  . Chronic right-sided low back pain with right-sided sciatica 06/25/2009  . Morbid obesity ([place]) 02/17/2007  . Hypothyroidism 06/14/2006  . DIVERTICULOSIS, COLON 03/01/2006  . HIATAL HERNIA WITH REFLUX, HX OF 03/01/2006    Past Surgical History:  Procedure Laterality Date  . BACK  SURGERY    . COLONOSCOPY  12/18/2010   Procedure: COLONOSCOPY;  Surgeon: Dorothyann Peng, MD;  [facility];  Service: Endoscopy;  Laterality: N/A;  10:15am  . cyst removal right wrist    . EYE SURGERY Bilateral    cataract surgery with lens implant  . TONSILLECTOMY    . TOTAL SHOULDER ARTHROPLASTY Left 12/21/2016   Procedure: TOTAL SHOULDER ARTHROPLASTY;  Surgeon: Meredith Pel, MD;  Location: [facility];  Service: Orthopedics;  Laterality: Left;  . TUBAL LIGATION    . umbillical hernia       OB History   No obstetric history on file.     Family History  Problem Relation Age of Onset  . Heart attack Sister        pacemaker  . Kidney failure Sister        on dialysis  . Colon cancer Neg Hx     Social History   Tobacco Use  . Smoking status: Former Smoker    Packs/day: 0.25    Years: 45.00    Pack years: 11.25    Types: Cigarettes    Start date: 09/30/1963    Quit date: 08/15/2015    Years since quitting: 4.4  . Smokeless tobacco: Never Used  Vaping Use  . Vaping Use: Never used  Substance Use Topics  .  Alcohol use: No    Alcohol/week: 0.0 standard drinks  . Drug use: No    Home Medications Prior to Admission medications   Medication Sig Start Date End Date Taking? Authorizing Provider  amLODipine (NORVASC) 5 MG tablet TAKE 1 TABLET BY MOUTH EVERY DAY 09/06/19   Fayrene Helper, MD  atorvastatin (LIPITOR) 40 MG tablet Take 1 tablet (40 mg total) by mouth daily. 09/13/19   Pahwani, Michell Heinrich, MD  levothyroxine (SYNTHROID) 100 MCG tablet Take 1 tablet (100 mcg total) by mouth daily. 09/13/19   Pahwani, Michell Heinrich, MD  omeprazole (PRILOSEC) 40 MG capsule TAKE 1 CAPSULE BY MOUTH EVERY DAY 08/30/19   Fayrene Helper, MD    Allergies    Hydrocodone and Sulfa antibiotics  Review of Systems   Review of Systems  Respiratory: Negative for shortness of breath.   Cardiovascular: Negative for chest pain.  Gastrointestinal: Positive for blood in stool. Negative for  abdominal pain and vomiting.  Neurological: Negative for light-headedness.  All other systems reviewed and are negative.   Physical Exam Updated Vital Signs BP 124/89   Pulse (!) 107   Temp 98.4 F (36.9 C) (Oral)   Resp 19   Ht 5' 1.5" (1.562 m)   Wt 95.3 kg   SpO2 100%   BMI 39.04 kg/m   Physical Exam Vitals and nursing note reviewed. Exam conducted with a chaperone present.  Constitutional:      General: She is not in acute distress.    Appearance: She is well-developed. She is obese. She is not ill-appearing or diaphoretic.  HENT:     Head: Normocephalic and atraumatic.     Right Ear: External ear normal.     Left Ear: External ear normal.     Nose: Nose normal.  Eyes:     General:        Right eye: No discharge.        Left eye: No discharge.  Cardiovascular:     Rate and Rhythm: Regular rhythm. Tachycardia present.     Heart sounds: Normal heart sounds.  Pulmonary:     Effort: Pulmonary effort is normal.  Abdominal:     Palpations: Abdomen is soft.     Tenderness: There is no abdominal tenderness.  Genitourinary:    Rectum: Guaiac result positive.     Comments: No rectal masses. There is dark red blood on DRE Skin:    General: Skin is warm and dry.  Neurological:     Mental Status: She is alert.  Psychiatric:        Mood and Affect: Mood is not anxious.     ED Results / Procedures / Treatments   Labs (all labs ordered are listed, but only abnormal results are displayed) Labs Reviewed  COMPREHENSIVE METABOLIC PANEL - Abnormal; Notable for the following components:      Result Value   Potassium 3.4 (*)    Glucose, Bld 105 (*)    All other components within normal limits  CBC - Abnormal; Notable for the following components:   WBC 14.0 (*)    All other components within normal limits  POC OCCULT BLOOD, ED - Abnormal; Notable for the following components:   Fecal Occult Bld POSITIVE (*)    All other components within normal limits  RESPIRATORY  PANEL BY RT PCR (FLU A&B, COVID)  URINALYSIS, ROUTINE W REFLEX MICROSCOPIC  TYPE AND SCREEN    EKG EKG Interpretation  Date/Time:  Monday January 28 2020 22:31:58  EDT Ventricular Rate:  108 PR Interval:    QRS Duration: 78 QT Interval:  321 QTC Calculation: 431 R Axis:   -9 Text Interpretation: Sinus tachycardia Low voltage, precordial leads Confirmed by Sherwood Gambler [phone]) on 01/28/2020 10:55:07 PM   Radiology No results found.  Procedures Procedures (including critical care time)  Medications Ordered in ED Medications  lactated ringers bolus 1,000 mL (1,000 mLs Intravenous New Bag/Given 01/28/20 2232)    ED Course  I have reviewed the triage vital signs and the nursing notes.  Pertinent labs & imaging results that were available during my care of the patient were reviewed by me and considered in my medical decision making (see chart for details).    MDM Rules/Calculators/A&P                          Patient's initial heart rate was in the 120s and during my first exam she was around 130 and up to 140 with orthostatics. She had another bloody bowel movement after the rectal exam. While she is not unstable, her tachycardia is concerning, even though it is now improving. Her initial hemoglobin is not stated but definitely anemic, I think she needs to be observed and probable GI consult in the morning if continuing to bleed.  Final Clinical Impression(s) / ED Diagnoses Final diagnoses:  Acute GI bleeding    Rx / DC Orders ED Discharge Orders    None       Sherwood Gambler, MD 01/28/20 2323

## 2020-01-28 NOTE — ED Triage Notes (Signed)
Pt c/o black tarry stools that started today; pt denies any pain

## 2020-01-29 ENCOUNTER — Inpatient Hospital Stay (HOSPITAL_COMMUNITY): Payer: Medicare HMO | Admitting: Anesthesiology

## 2020-01-29 ENCOUNTER — Encounter (HOSPITAL_COMMUNITY)
Admission: EM | Disposition: A | Discharge status: Home / Self Care | Payer: Self-pay | Source: Home / Self Care | Attending: Internal Medicine

## 2020-01-29 ENCOUNTER — Encounter (HOSPITAL_COMMUNITY): Payer: Self-pay | Admitting: Family Medicine

## 2020-01-29 DIAGNOSIS — K922 Gastrointestinal hemorrhage, unspecified: Secondary | ICD-10-CM | POA: Diagnosis not present

## 2020-01-29 DIAGNOSIS — K269 Duodenal ulcer, unspecified as acute or chronic, without hemorrhage or perforation: Secondary | ICD-10-CM | POA: Diagnosis present

## 2020-01-29 DIAGNOSIS — E038 Other specified hypothyroidism: Secondary | ICD-10-CM

## 2020-01-29 DIAGNOSIS — I10 Essential (primary) hypertension: Secondary | ICD-10-CM

## 2020-01-29 DIAGNOSIS — K259 Gastric ulcer, unspecified as acute or chronic, without hemorrhage or perforation: Secondary | ICD-10-CM | POA: Diagnosis present

## 2020-01-29 DIAGNOSIS — E039 Hypothyroidism, unspecified: Secondary | ICD-10-CM | POA: Diagnosis not present

## 2020-01-29 DIAGNOSIS — E785 Hyperlipidemia, unspecified: Secondary | ICD-10-CM

## 2020-01-29 DIAGNOSIS — T39395A Adverse effect of other nonsteroidal anti-inflammatory drugs [NSAID], initial encounter: Secondary | ICD-10-CM

## 2020-01-29 DIAGNOSIS — K921 Melena: Secondary | ICD-10-CM | POA: Diagnosis present

## 2020-01-29 DIAGNOSIS — K219 Gastro-esophageal reflux disease without esophagitis: Secondary | ICD-10-CM

## 2020-01-29 DIAGNOSIS — K264 Chronic or unspecified duodenal ulcer with hemorrhage: Secondary | ICD-10-CM

## 2020-01-29 HISTORY — PX: BIOPSY: SHX5522

## 2020-01-29 HISTORY — PX: ESOPHAGOGASTRODUODENOSCOPY (EGD) WITH PROPOFOL: SHX5813

## 2020-01-29 LAB — COMPREHENSIVE METABOLIC PANEL
ALT: 11 U/L (ref 0–44)
AST: 15 U/L (ref 15–41)
Albumin: 3.5 g/dL (ref 3.5–5.0)
Alkaline Phosphatase: 91 U/L (ref 38–126)
Anion gap: 9 (ref 5–15)
BUN: 18 mg/dL (ref 8–23)
CO2: 25 mmol/L (ref 22–32)
Calcium: 8.7 mg/dL — ABNORMAL LOW (ref 8.9–10.3)
Chloride: 106 mmol/L (ref 98–111)
Creatinine, Ser: 0.59 mg/dL (ref 0.44–1.00)
GFR, Estimated: >60 mL/min (ref >60)
Glucose, Bld: 95 mg/dL (ref 70–99)
Potassium: 3.8 mmol/L (ref 3.5–5.1)
Sodium: 140 mmol/L (ref 135–145)
Total Bilirubin: 0.7 mg/dL (ref 0.3–1.2)
Total Protein: 6.6 g/dL (ref 6.5–8.1)

## 2020-01-29 LAB — PROTIME-INR
INR: 1 (ref 0.8–1.2)
Prothrombin Time: 12.8 seconds (ref 11.4–15.2)

## 2020-01-29 LAB — CBC
HCT: 35.8 % — ABNORMAL LOW (ref 36.0–46.0)
Hemoglobin: 11 g/dL — ABNORMAL LOW (ref 12.0–15.0)
MCH: 27.3 pg (ref 26.0–34.0)
MCHC: 30.7 g/dL (ref 30.0–36.0)
MCV: 88.8 fL (ref 80.0–100.0)
Platelets: 309 10*3/uL (ref 150–400)
RBC: 4.03 MIL/uL (ref 3.87–5.11)
RDW: 14.3 % (ref 11.5–15.5)
WBC: 12.1 10*3/uL — ABNORMAL HIGH (ref 4.0–10.5)
nRBC: 0 % (ref 0.0–0.2)

## 2020-01-29 LAB — HIV ANTIBODY (ROUTINE TESTING W REFLEX): HIV Screen 4th Generation wRfx: NONREACTIVE

## 2020-01-29 LAB — MAGNESIUM: Magnesium: 2 mg/dL (ref 1.7–2.4)

## 2020-01-29 LAB — TSH: TSH: 0.221 u[IU]/mL — ABNORMAL LOW (ref 0.350–4.500)

## 2020-01-29 SURGERY — ESOPHAGOGASTRODUODENOSCOPY (EGD) WITH PROPOFOL
Anesthesia: General

## 2020-01-29 MED ORDER — LIDOCAINE VISCOUS HCL 2 % MT SOLN
OROMUCOSAL | Status: AC
  Administered 2020-01-29: 15 mL via OROMUCOSAL
  Filled 2020-01-29: qty 15

## 2020-01-29 MED ORDER — POTASSIUM CHLORIDE 10 MEQ/100ML IV SOLN
10.0000 meq | INTRAVENOUS | Status: AC
  Administered 2020-01-29 (×2): 10 meq via INTRAVENOUS
  Filled 2020-01-29 (×2): qty 100

## 2020-01-29 MED ORDER — PROPOFOL 500 MG/50ML IV EMUL
INTRAVENOUS | Status: DC | PRN
  Administered 2020-01-29: 150 ug/kg/min via INTRAVENOUS

## 2020-01-29 MED ORDER — AMLODIPINE BESYLATE 5 MG PO TABS
5.0000 mg | ORAL_TABLET | Freq: Every day | ORAL | Status: DC
  Administered 2020-01-30 – 2020-01-31 (×2): 5 mg via ORAL
  Filled 2020-01-29 (×3): qty 1

## 2020-01-29 MED ORDER — STERILE WATER FOR IRRIGATION IR SOLN
Status: DC | PRN
  Administered 2020-01-29: 1.5 mL

## 2020-01-29 MED ORDER — LACTATED RINGERS IV SOLN: INTRAVENOUS | Status: DC

## 2020-01-29 MED ORDER — ACETAMINOPHEN 325 MG PO TABS: 650.0000 mg | ORAL_TABLET | Freq: Four times a day (QID) | ORAL | Status: DC | PRN

## 2020-01-29 MED ORDER — ATORVASTATIN CALCIUM 40 MG PO TABS
40.0000 mg | ORAL_TABLET | Freq: Every day | ORAL | Status: DC
  Filled 2020-01-29 (×2): qty 1

## 2020-01-29 MED ORDER — PROPOFOL 10 MG/ML IV BOLUS
INTRAVENOUS | Status: DC | PRN
  Administered 2020-01-29: 50 mg via INTRAVENOUS

## 2020-01-29 MED ORDER — LACTATED RINGERS IV SOLN: INTRAVENOUS | Status: DC | PRN

## 2020-01-29 MED ORDER — ONDANSETRON HCL 4 MG PO TABS: 4.0000 mg | ORAL_TABLET | Freq: Four times a day (QID) | ORAL | Status: DC | PRN

## 2020-01-29 MED ORDER — SODIUM CHLORIDE 0.9 % IV SOLN: INTRAVENOUS | Status: DC

## 2020-01-29 MED ORDER — GLYCOPYRROLATE 0.2 MG/ML IJ SOLN
INTRAMUSCULAR | Status: AC
  Administered 2020-01-29: 0.2 mg via INTRAVENOUS
  Filled 2020-01-29: qty 1

## 2020-01-29 MED ORDER — LIDOCAINE HCL (CARDIAC) PF 100 MG/5ML IV SOSY
PREFILLED_SYRINGE | INTRAVENOUS | Status: DC | PRN
  Administered 2020-01-29: 50 mg via INTRAVENOUS

## 2020-01-29 MED ORDER — LIDOCAINE VISCOUS HCL 2 % MT SOLN: 15.0000 mL | Freq: Once | OROMUCOSAL | Status: AC

## 2020-01-29 MED ORDER — GLYCOPYRROLATE 0.2 MG/ML IJ SOLN: 0.2000 mg | Freq: Once | INTRAMUSCULAR | Status: AC

## 2020-01-29 MED ORDER — LEVOTHYROXINE SODIUM 100 MCG PO TABS
100.0000 ug | ORAL_TABLET | Freq: Every day | ORAL | Status: DC
  Administered 2020-01-30 – 2020-01-31 (×2): 100 ug via ORAL
  Filled 2020-01-29 (×3): qty 1

## 2020-01-29 MED ORDER — ACETAMINOPHEN 650 MG RE SUPP: 650.0000 mg | Freq: Four times a day (QID) | RECTAL | Status: DC | PRN

## 2020-01-29 MED ORDER — ONDANSETRON HCL 4 MG/2ML IJ SOLN: 4.0000 mg | Freq: Four times a day (QID) | INTRAMUSCULAR | Status: DC | PRN

## 2020-01-29 NOTE — Anesthesia Postprocedure Evaluation (Signed)
Anesthesia Post Note  Patient: Victoria Reyes  Procedure(s) Performed: ESOPHAGOGASTRODUODENOSCOPY (EGD) WITH PROPOFOL (N/A ) BIOPSY  Patient location during evaluation: Phase II Anesthesia Type: General Level of consciousness: awake, oriented, awake and alert and patient cooperative Pain management: satisfactory to patient Vital Signs Assessment: post-procedure vital signs reviewed and stable Respiratory status: spontaneous breathing, respiratory function stable and nonlabored ventilation Cardiovascular status: stable Postop Assessment: no apparent nausea or vomiting Anesthetic complications: no   No complications documented.   Last Vitals:  Vitals:   01/29/20 0845 01/29/20 0953  BP: (!) 142/76 (!) 149/71  Pulse: 70 66  Resp: 16 15  Temp: 36.7 C 36.7 C  SpO2:  100%    Last Pain:  Vitals:   01/29/20 1220  TempSrc:   PainSc: 0-No pain                 Astou Lada

## 2020-01-29 NOTE — Transfer of Care (Signed)
Immediate Anesthesia Transfer of Care Note  Patient: Victoria Reyes  Procedure(s) Performed: ESOPHAGOGASTRODUODENOSCOPY (EGD) WITH PROPOFOL (N/A ) BIOPSY  Patient Location: PACU  Anesthesia Type:General  Level of Consciousness: awake, alert , oriented and patient cooperative  Airway & Oxygen Therapy: Patient Spontanous Breathing  Post-op Assessment: Report given to RN, Post -op Vital signs reviewed and stable and Patient moving all extremities X 4  Post vital signs: Reviewed and stable  Last Vitals:  Vitals Value Taken Time  BP    Temp    Pulse    Resp    SpO2      Last Pain:  Vitals:   01/29/20 1220  TempSrc:   PainSc: 0-No pain      Patients Stated Pain Goal: 0 (25/95/63 [id number])  Complications: No complications documented.

## 2020-01-29 NOTE — Anesthesia Preprocedure Evaluation (Signed)
Anesthesia Evaluation  Patient identified by MRN, date of birth, ID band Patient awake    Reviewed: Allergy & Precautions, H&P , NPO status , Patient's Chart, lab work & pertinent test results, reviewed documented beta blocker date and time   Airway Mallampati: II  TM Distance: >3 FB Neck ROM: full    Dental no notable dental hx.    Pulmonary pneumonia, resolved, former smoker,    Pulmonary exam normal breath sounds clear to auscultation       Cardiovascular Exercise Tolerance: Good hypertension, negative cardio ROS   Rhythm:regular Rate:Normal     Neuro/Psych  Headaches,  Neuromuscular disease negative psych ROS   GI/Hepatic Neg liver ROS, hiatal hernia, GERD  Medicated,  Endo/Other  Hypothyroidism   Renal/GU negative Renal ROS  negative genitourinary   Musculoskeletal   Abdominal   Peds  Hematology  (+) Blood dyscrasia, anemia ,   Anesthesia Other Findings   Reproductive/Obstetrics negative OB ROS                             Anesthesia Physical Anesthesia Plan  ASA: III and emergent  Anesthesia Plan: General   Post-op Pain Management:    Induction:   PONV Risk Score and Plan: Propofol infusion  Airway Management Planned:   Additional Equipment:   Intra-op Plan:   Post-operative Plan:   Informed Consent: I have reviewed the patients History and Physical, chart, labs and discussed the procedure including the risks, benefits and alternatives for the proposed anesthesia with the patient or authorized representative who has indicated his/her understanding and acceptance.     Dental Advisory Given  Plan Discussed with: CRNA  Anesthesia Plan Comments:         Anesthesia Quick Evaluation

## 2020-01-29 NOTE — Progress Notes (Signed)
PROGRESS NOTE   Victoria Reyes  GYJ:[id number] DOB: [birth year] DOA: 01/28/2020 PCP: Victoria Helper, MD   Chief Complaint  Patient presents with  . Rectal Bleeding    Brief Admission History:  69 y.o. female, with chief complaint of prediabetes, obesity, neuropathy, hypothyroidism, hypertension, hyperlipidemia, diverticulosis, and more presents to the ED with a chief complaint of melena.  Patient reports that today she has had 2 black tarry bowel movements.  The second 1 had burgundy colored blood mixed in with it.  Patient reports that she has had no stomach pain, no nausea no vomiting.  Her last meal was dinner before she came into the hospital.  Her last normal bowel movement would have been on October 17.  Patient has not had a fever, she has not had diarrhea, she has never had a bleed like this before.  Patient repeatedly states that she was in her totally normal state of health and "there is nothing wrong with me."  Patient does have urinary frequency that is a chronic issue for her.  She was hemoccult positive.    Assessment & Plan:   Active Problems:   Hypothyroidism   GERD (gastroesophageal reflux disease)   Hyperlipidemia with target LDL less than 100   HTN (hypertension)   GI bleed   Melena   NSAID-induced gastric ulcer   NSAID-induced duodenal ulcer  1. Upper GI bleed - EGD with findings of NSAID induced gastric and duodenal ulcers. She has been started on IV protonix 40 mg every 12 hours, clear liquid diet. Continue supportive measures.  Pt advised to avoid NSAIDs in the future.  2. GERD - IV protonix ordered.  3. Essential hypertension - stable and controlled blood pressures. Resumed home amlodipine.  4. Hyperlipidemia - resumed home medication.  5. Orthostatic hypotension - this is being addressed with IV fluids.  6. Reactive leukocytosis - secondary to GI bleeding.  This is trending down.  No infections found.  7. Hypothyroidism - TSH suppressed, check free T4.     DVT prophylaxis:  SCDs Code Status: Full  Family Communication:  Disposition:   Status is: Inpatient  Remains inpatient appropriate because:IV treatments appropriate due to intensity of illness or inability to take PO and Inpatient level of care appropriate due to severity of illness   Dispo: The patient is from: Home              Anticipated d/c is to: Home              Anticipated d/c date is: 1 day              Patient currently is not medically stable to d/c.   Consultants:   GI   Procedures:   EGD 01/29/20: Dr. Jenetta Downer Performed EGD under propofol.  Patient had presence of 3 ulcers with clean base in the incisura.  She was also found to have 5 cratered ulcers in her duodenal bulb and first portion of the duodenum with significant edema of the duodenal sweep.  2 of the ulcers had some hematin at the rate but no vessel was identified.  2 of the ulcers had a spontaneous oozing of blood.  This was ablated with bipolar probe.  Biopsies were taken from the stomach to rule out H. Pylori.  Constellation of findings is likely related to NSAID use.  Recommendations - Return patient to hospital ward for ongoing care.  - Clear liquid diet, advance tomorrow if tolerated.  - Continue pantoprazole 40 mg  BID IV. - Await pathology results.  - No ibuprofen, naproxen, or other non-steroidal anti-inflammatory drugs.   Maylon Peppers, MD  Antimicrobials:  N/a   Subjective: Pt seems equivocal about staying to be observed. No other complaints.  She did have a black stool overnight.    Objective: Vitals:   01/29/20 0751 01/29/20 0845 01/29/20 0953 01/29/20 1259  BP:  (!) 142/76 (!) 149/71 132/69  Pulse:  70 66   Resp:  16 15 18   Temp: 98.1 F (36.7 C) 98 F (36.7 C) 98 F (36.7 C) 97.9 F (36.6 C)  TempSrc: Oral Oral Oral Oral  SpO2:   100% 96%  Weight:   95.3 kg   Height:   5' 1.5" (1.562 m)     Intake/Output Summary (Last 24 hours) at 01/29/2020 1342 Last data  filed at 01/29/2020 1249 Gross per 24 hour  Intake 500 ml  Output --  Net 500 ml   Filed Weights   01/28/20 2057 01/29/20 0953  Weight: 95.3 kg 95.3 kg    Examination:  General exam: Appears calm and comfortable  Respiratory system: Clear to auscultation. Respiratory effort normal. Cardiovascular system: S1 & S2 heard, RRR. No JVD, murmurs, rubs, gallops or clicks. No pedal edema. Gastrointestinal system: Abdomen is nondistended, soft and nontender. No organomegaly or masses felt. Normal bowel sounds heard. Central nervous system: Alert and oriented. No focal neurological deficits. Extremities: Symmetric 5 x 5 power. Skin: No rashes, lesions or ulcers Psychiatry: Judgement and insight appear normal. Mood & affect appropriate.   Data Reviewed: I have personally reviewed following labs and imaging studies  CBC: Recent Labs  Lab 01/28/20 2146 01/29/20 0501  WBC 14.0* 12.1*  HGB 12.5 11.0*  HCT 39.6 35.8*  MCV 86.8 88.8  PLT 185 607    Basic Metabolic Panel: Recent Labs  Lab 01/28/20 2146 01/29/20 0501  NA 140 140  K 3.4* 3.8  CL 104 106  CO2 23 25  GLUCOSE 105* 95  BUN 23 18  CREATININE 0.70 0.59  CALCIUM 9.1 8.7*  MG  --  2.0    GFR: Estimated Creatinine Clearance: 69.7 mL/min (by C-G formula based on SCr of 0.59 mg/dL).  Liver Function Tests: Recent Labs  Lab 01/28/20 2146 01/29/20 0501  AST 16 15  ALT 12 11  ALKPHOS 107 91  BILITOT 0.7 0.7  PROT 7.3 6.6  ALBUMIN 3.8 3.5    CBG: No results for input(s): GLUCAP in the last 168 hours.  Recent Results (from the past 240 hour(s))  Respiratory Panel by RT PCR (Flu A&B, Covid) - Nasopharyngeal Swab     Status: None   Collection Time: 01/28/20 10:50 PM   Specimen: Nasopharyngeal Swab  Result Value Ref Range Status   SARS Coronavirus 2 by RT PCR NEGATIVE NEGATIVE Final    Comment: (NOTE) SARS-CoV-2 target nucleic acids are NOT DETECTED.  The SARS-CoV-2 RNA is generally detectable in upper  respiratoy specimens during the acute phase of infection. The lowest concentration of SARS-CoV-2 viral copies this assay can detect is 131 copies/mL. A negative result does not preclude SARS-Cov-2 infection and should not be used as the sole basis for treatment or other patient management decisions. A negative result may occur with  improper specimen collection/handling, submission of specimen other than nasopharyngeal swab, presence of viral mutation(s) within the areas targeted by this assay, and inadequate number of viral copies (<131 copies/mL). A negative result must be combined with clinical observations, patient history, and epidemiological information. The  expected result is Negative.  Fact Sheet for Patients:  PinkCheek.be  Fact Sheet for Healthcare Providers:  GravelBags.it  This test is no t yet approved or cleared by the [place] FDA and  has been authorized for detection and/or diagnosis of SARS-CoV-2 by FDA under an Emergency Use Authorization (EUA). This EUA will remain  in effect (meaning this test can be used) for the duration of the COVID-19 declaration under Section 564(b)(1) of the Act, 21 U.S.C. section 360bbb-3(b)(1), unless the authorization is terminated or revoked sooner.     Influenza A by PCR NEGATIVE NEGATIVE Final   Influenza B by PCR NEGATIVE NEGATIVE Final    Comment: (NOTE) The Xpert Xpress SARS-CoV-2/FLU/RSV assay is intended as an aid in  the diagnosis of influenza from Nasopharyngeal swab specimens and  should not be used as a sole basis for treatment. Nasal washings and  aspirates are unacceptable for Xpert Xpress SARS-CoV-2/FLU/RSV  testing.  Fact Sheet for Patients: PinkCheek.be  Fact Sheet for Healthcare Providers: GravelBags.it  This test is not yet approved or cleared by the [place] FDA and  has been  authorized for detection and/or diagnosis of SARS-CoV-2 by  FDA under an Emergency Use Authorization (EUA). This EUA will remain  in effect (meaning this test can be used) for the duration of the  Covid-19 declaration under Section 564(b)(1) of the Act, 21  U.S.C. section 360bbb-3(b)(1), unless the authorization is  terminated or revoked. Performed at [facility], [address], [place], [place] [address]      Radiology Studies: No results found.   Scheduled Meds: . amLODipine  5 mg Oral Daily  . atorvastatin  40 mg Oral Daily  . levothyroxine  100 mcg Oral Daily  . pantoprazole (PROTONIX) IV  40 mg Intravenous Q12H   Continuous Infusions: . lactated ringers 75 mL/hr at 01/29/20 0055     LOS: 1 day   Time spent: 30 minutes   Mirabel Ahlgren Wynetta Emery, MD How to contact the [facility] Attending or Consulting provider [place] or covering provider during after hours [place], for this patient?  1. Check the care team in [facility] and look for a) attending/consulting [facility] provider listed and b) the [facility] team listed 2. Log into [email or web address] and use [place]'s universal password to access. If you do not have the password, please contact the hospital operator. 3. Locate the [facility] provider you are looking for under [facility] and page to a number that you can be directly reached. 4. If you still have difficulty reaching the provider, please page the [facility] (Director on Call) for the Hospitalists listed on amion for assistance.  01/29/2020, 1:42 PM

## 2020-01-29 NOTE — ED Notes (Signed)
GI Provider at bedside. 

## 2020-01-29 NOTE — H&P (Addendum)
TRH H&P    Patient Demographics:    Victoria Reyes, is a 69 y.o. female  MRN: [id number]  DOB - Sep 27, 1950  Admit Date - 01/28/2020  Referring MD/NP/PA: Regenia Skeeter  Outpatient Primary MD for the patient is Fayrene Helper, MD  Patient coming from: Home  Chief complaint-melena   HPI:    Victoria Reyes  is a 69 y.o. female, with chief complaint of prediabetes, obesity, neuropathy, hypothyroidism, hypertension, hyperlipidemia, diverticulosis, and more presents to the ED with a chief complaint of melena.  Patient reports that today she has had 2 black tarry bowel movements.  The second 1 had burgundy colored blood mixed in with it.  Patient reports that she has had no stomach pain, no nausea no vomiting.  Her last meal was dinner before she came into the hospital.  Her last normal bowel movement would have been on October 17.  Patient has not had a fever, she has not had diarrhea, she has never had a bleed like this before.  Patient repeatedly states that she was in her totally normal state of health and "there is nothing wrong with me."  Patient does have urinary frequency that is a chronic issue for her.  In the ED Temperature 98.4, respiratory rate 19, heart rate 107, blood pressure 124/89, satting at 100% White blood cell count 14 hemoglobin 12.5 CHEM panel unremarkable aside from mild hypokalemia at 3.4 UA is not indicative of UTI FOBT positive 1 L LR given Type and screen EKG showing sinus tach Orthostatic vitals with heart rate they went up to 140    Review of systems:    In addition to the HPI above,  No Fever-chills, No Headache, No changes with Vision or hearing, No problems swallowing food or Liquids, No Chest pain, Cough or Shortness of Breath, No Abdominal pain, No Nausea or Vomiting, bowel movements are regular, Positive for blood in stool but not in urine  No dysuria, positive for  urinary frequency  no new skin rashes or bruises, No new joints pains-aches,  No new weakness, tingling, numbness in any extremity, No recent weight gain or loss, No polyuria, polydypsia or polyphagia, No significant Mental Stressors.  All other systems reviewed and are negative.    Past History of the following :    Past Medical History:  Diagnosis Date  . Anemia    as a  young woman  . Diverticulitis 2005   CT   . DJD (degenerative joint disease)   . GERD (gastroesophageal reflux disease)   . Headache(784.0)   . Hiatal hernia   . Hyperlipidemia   . Hypertension   . Hypothyroidism   . Knee pain, right   . Low back pain   . Neuropathy    peroneal nerve  . Obesity   . Pneumonia    as child  . Pre-diabetes   . S/P colonoscopy 2005   Dr. Tamala Julian: sigmoid diverticulosis  . Thyroid disease   . Tobacco abuse       Past Surgical History:  Procedure Laterality Date  .  BACK SURGERY    . COLONOSCOPY  12/18/2010   Procedure: COLONOSCOPY;  Surgeon: Dorothyann Peng, MD;  Location[facility];  Service: Endoscopy;  Laterality: N/A;  10:15am  . cyst removal right wrist    . EYE SURGERY Bilateral    cataract surgery with lens implant  . TONSILLECTOMY    . TOTAL SHOULDER ARTHROPLASTY Left 12/21/2016   Procedure: TOTAL SHOULDER ARTHROPLASTY;  Surgeon: Meredith Pel, MD;  Location: [facility];  Service: Orthopedics;  Laterality: Left;  . TUBAL LIGATION    . umbillical hernia        Social History:      Social History   Tobacco Use  . Smoking status: Former Smoker    Packs/day: 0.25    Years: 45.00    Pack years: 11.25    Types: Cigarettes    Start date: 09/30/1963    Quit date: 08/15/2015    Years since quitting: 4.4  . Smokeless tobacco: Never Used  Substance Use Topics  . Alcohol use: No    Alcohol/week: 0.0 standard drinks       Family History :     Family History  Problem Relation Age of Onset  . Heart attack Sister        pacemaker  . Kidney failure  Sister        on dialysis  . Colon cancer Neg Hx       Home Medications:   Prior to Admission medications   Medication Sig Start Date End Date Taking? Authorizing Provider  amLODipine (NORVASC) 5 MG tablet TAKE 1 TABLET BY MOUTH EVERY DAY 09/06/19   Fayrene Helper, MD  atorvastatin (LIPITOR) 40 MG tablet Take 1 tablet (40 mg total) by mouth daily. 09/13/19   Pahwani, Michell Heinrich, MD  levothyroxine (SYNTHROID) 100 MCG tablet Take 1 tablet (100 mcg total) by mouth daily. 09/13/19   Pahwani, Michell Heinrich, MD  omeprazole (PRILOSEC) 40 MG capsule TAKE 1 CAPSULE BY MOUTH EVERY DAY 08/30/19   Fayrene Helper, MD     Allergies:     Allergies  Allergen Reactions  . Hydrocodone Nausea Only    REACTION: Nausea  . Sulfa Antibiotics Itching     Physical Exam:   Vitals  Blood pressure 133/66, pulse 78, temperature 98.4 F (36.9 C), temperature source Oral, resp. rate 20, height 5' 1.5" (1.562 m), weight 95.3 kg, SpO2 98 %.  1.  General: Laying left lateral decubitus in bed, spirited, no acute distress  2. Psychiatric: Alert and oriented x3, mood and behavior normal for situation  3. Neurologic: Cranial nerves II through XII are intact, moves all 4 extremities voluntarily, normal gait, no focal deficit on limited exam  4. HEENMT:  Head is atraumatic, normocephalic, pupils reactive to light, neck is supple, trachea is midline  5. Respiratory : Lungs are clear to auscultation bilaterally  6. Cardiovascular : Heart rate has normalized at the time of my exam, regular rhythm, no murmurs rubs or gallops  7. Gastrointestinal:  Abdomen is soft, nondistended, nontender to palpation with normoactive bowel sounds  8. Skin:  No acute lesion on limited skin exam  9.Musculoskeletal:  2+ pitting edema, no acute deformity    Data Review:    CBC Recent Labs  Lab 01/28/20 2146  WBC 14.0*  HGB 12.5  HCT 39.6  PLT 185  MCV 86.8  MCH 27.4  MCHC 31.6  RDW 14.2    ------------------------------------------------------------------------------------------------------------------  Results for orders placed or performed during the  hospital encounter of 01/28/20 (from the past 48 hour(s))  Comprehensive metabolic panel     Status: Abnormal   Collection Time: 01/28/20  9:46 PM  Result Value Ref Range   Sodium 140 135 - 145 mmol/L   Potassium 3.4 (L) 3.5 - 5.1 mmol/L   Chloride 104 98 - 111 mmol/L   CO2 23 22 - 32 mmol/L   Glucose, Bld 105 (H) 70 - 99 mg/dL    Comment: Glucose reference range applies only to samples taken after fasting for at least 8 hours.   BUN 23 8 - 23 mg/dL   Creatinine, Ser 0.70 0.44 - 1.00 mg/dL   Calcium 9.1 8.9 - 10.3 mg/dL   Total Protein 7.3 6.5 - 8.1 g/dL   Albumin 3.8 3.5 - 5.0 g/dL   AST 16 15 - 41 U/L   ALT 12 0 - 44 U/L   Alkaline Phosphatase 107 38 - 126 U/L   Total Bilirubin 0.7 0.3 - 1.2 mg/dL   GFR, Estimated >60 >60 mL/min   Anion gap 13 5 - 15    Comment: Performed at [facility], [address], [place], [place] [address]  CBC     Status: Abnormal   Collection Time: 01/28/20  9:46 PM  Result Value Ref Range   WBC 14.0 (H) 4.0 - 10.5 K/uL   RBC 4.56 3.87 - 5.11 MIL/uL   Hemoglobin 12.5 12.0 - 15.0 g/dL   HCT 39.6 36 - 46 %   MCV 86.8 80.0 - 100.0 fL   MCH 27.4 26.0 - 34.0 pg   MCHC 31.6 30.0 - 36.0 g/dL   RDW 14.2 11.5 - 15.5 %   Platelets 185 150 - 400 K/uL   nRBC 0.0 0.0 - 0.2 %    Comment: Performed at [facility], [address] [place], [place] [address]  Type and screen [facility]     Status: None   Collection Time: 01/28/20  9:46 PM  Result Value Ref Range   ABO/RH(D) O POS    Antibody Screen NEG    Sample Expiration      01/31/2020,2359 Performed at [facility], [address], [place], [place] [address]   Urinalysis, Routine w reflex microscopic Urine, Clean Catch     Status: None   Collection Time: 01/28/20  9:51 PM  Result Value Ref Range   Color, Urine YELLOW YELLOW    APPearance CLEAR CLEAR   Specific Gravity, Urine 1.015 1.005 - 1.030   pH 5.0 5.0 - 8.0   Glucose, UA NEGATIVE NEGATIVE mg/dL   Hgb urine dipstick NEGATIVE NEGATIVE   Bilirubin Urine NEGATIVE NEGATIVE   Ketones, ur NEGATIVE NEGATIVE mg/dL   Protein, ur NEGATIVE NEGATIVE mg/dL   Nitrite NEGATIVE NEGATIVE   Leukocytes,Ua NEGATIVE NEGATIVE    Comment: Performed at [facility], [address], [place], [place] [address]  POC occult blood, ED     Status: Abnormal   Collection Time: 01/28/20 10:19 PM  Result Value Ref Range   Fecal Occult Bld POSITIVE (A) NEGATIVE  Respiratory Panel by RT PCR (Flu A&B, Covid) - Nasopharyngeal Swab     Status: None   Collection Time: 01/28/20 10:50 PM   Specimen: Nasopharyngeal Swab  Result Value Ref Range   SARS Coronavirus 2 by RT PCR NEGATIVE NEGATIVE    Comment: (NOTE) SARS-CoV-2 target nucleic acids are NOT DETECTED.  The SARS-CoV-2 RNA is generally detectable in upper respiratoy specimens during the acute phase of infection. The lowest concentration  of SARS-CoV-2 viral copies this assay can detect is 131 copies/mL. A negative result does not preclude SARS-Cov-2 infection and should not be used as the sole basis for treatment or other patient management decisions. A negative result may occur with  improper specimen collection/handling, submission of specimen other than nasopharyngeal swab, presence of viral mutation(s) within the areas targeted by this assay, and inadequate number of viral copies (<131 copies/mL). A negative result must be combined with clinical observations, patient history, and epidemiological information. The expected result is Negative.  Fact Sheet for Patients:  PinkCheek.be  Fact Sheet for Healthcare Providers:  GravelBags.it  This test is no t yet approved or cleared by the [place] FDA and  has been authorized for detection and/or diagnosis of SARS-CoV-2  by FDA under an Emergency Use Authorization (EUA). This EUA will remain  in effect (meaning this test can be used) for the duration of the COVID-19 declaration under Section 564(b)(1) of the Act, 21 U.S.C. section 360bbb-3(b)(1), unless the authorization is terminated or revoked sooner.     Influenza A by PCR NEGATIVE NEGATIVE   Influenza B by PCR NEGATIVE NEGATIVE    Comment: (NOTE) The Xpert Xpress SARS-CoV-2/FLU/RSV assay is intended as an aid in  the diagnosis of influenza from Nasopharyngeal swab specimens and  should not be used as a sole basis for treatment. Nasal washings and  aspirates are unacceptable for Xpert Xpress SARS-CoV-2/FLU/RSV  testing.  Fact Sheet for Patients: PinkCheek.be  Fact Sheet for Healthcare Providers: GravelBags.it  This test is not yet approved or cleared by the [place] FDA and  has been authorized for detection and/or diagnosis of SARS-CoV-2 by  FDA under an Emergency Use Authorization (EUA). This EUA will remain  in effect (meaning this test can be used) for the duration of the  Covid-19 declaration under Section 564(b)(1) of the Act, 21  U.S.C. section 360bbb-3(b)(1), unless the authorization is  terminated or revoked. Performed at [facility], [address], [place], [place] [address]     Chemistries  Recent Labs  Lab 01/28/20 2146  NA 140  K 3.4*  CL 104  CO2 23  GLUCOSE 105*  BUN 23  CREATININE 0.70  CALCIUM 9.1  AST 16  ALT 12  ALKPHOS 107  BILITOT 0.7   ------------------------------------------------------------------------------------------------------------------  ------------------------------------------------------------------------------------------------------------------ GFR: Estimated Creatinine Clearance: 69.7 mL/min (by C-G formula based on SCr of 0.7 mg/dL). Liver Function Tests: Recent Labs  Lab 01/28/20 2146  AST 16  ALT 12  ALKPHOS 107   BILITOT 0.7  PROT 7.3  ALBUMIN 3.8   No results for input(s): LIPASE, AMYLASE in the last 168 hours. No results for input(s): AMMONIA in the last 168 hours. Coagulation Profile: No results for input(s): INR, PROTIME in the last 168 hours. Cardiac Enzymes: No results for input(s): CKTOTAL, CKMB, CKMBINDEX, TROPONINI in the last 168 hours. BNP (last 3 results) No results for input(s): PROBNP in the last 8760 hours. HbA1C: No results for input(s): HGBA1C in the last 72 hours. CBG: No results for input(s): GLUCAP in the last 168 hours. Lipid Profile: No results for input(s): CHOL, HDL, LDLCALC, TRIG, CHOLHDL, LDLDIRECT in the last 72 hours. Thyroid Function Tests: No results for input(s): TSH, T4TOTAL, FREET4, T3FREE, THYROIDAB in the last 72 hours. Anemia Panel: No results for input(s): VITAMINB12, FOLATE, FERRITIN, TIBC, IRON, RETICCTPCT in the last 72 hours.  --------------------------------------------------------------------------------------------------------------- Urine analysis:    Component Value Date/Time   COLORURINE YELLOW 01/28/2020 2151   APPEARANCEUR CLEAR 01/28/2020 2151  LABSPEC 1.015 01/28/2020 2151   PHURINE 5.0 01/28/2020 2151   GLUCOSEU NEGATIVE 01/28/2020 2151   HGBUR NEGATIVE 01/28/2020 2151   HGBUR negative 10/06/2007 [place] 01/28/2020 2151   BILIRUBINUR negative 07/26/2017 [place] 01/28/2020 2151   PROTEINUR NEGATIVE 01/28/2020 2151   UROBILINOGEN 0.2 07/26/2017 1151   UROBILINOGEN 0.2 10/06/2007 1017   NITRITE NEGATIVE 01/28/2020 2151   LEUKOCYTESUR NEGATIVE 01/28/2020 2151      Imaging Results:    No results found.  My personal review of EKG: Rhythm ST, Rate 108 /min, QTc 431 ,no Acute ST changes   Assessment & Plan:    Active Problems:   GI bleed   1. GI bleed 1. Hemoglobin stable at 12.5-trend  2. N.p.o. 3. Protonix twice daily 4. Type and screen 5. Consult  gastro 2. Leukocytosis 1. White blood cell count of 14, likely secondary to GI bleed reaction 2. Trend in the a.m. 3. No infectious symptoms 3. Hypokalemia 1. Replace and recheck 4. Hypothyroidism 1. Check TSH 2. Continue levothyroxine 5. Hyperlipidemia 1. Continue home medication 6. Orthostatic hypotension 1. With orthostatic vital signs heart rate went up to 140 2. Continue monitor orthostatic vital signs every shift 3. Continue fluids    DVT Prophylaxis-   no pharmaceutical anticoagulation due to active bleed -SCDs   AM Labs Ordered, also please review Full Orders  Family Communication: No family at bedside  Code Status: Full code  Admission status:  Inpatient :The appropriate admission status for this patient is INPATIENT. Inpatient status is judged to be reasonable and necessary in order to provide the required intensity of service to ensure the patient's safety. The patient's presenting symptoms, physical exam findings, and initial radiographic and laboratory data in the context of their chronic comorbidities is felt to place them at high risk for further clinical deterioration. Furthermore, it is not anticipated that the patient will be medically stable for discharge from the hospital within 2 midnights of admission. The following factors support the admission status of inpatient.     The patient's presenting symptoms include melena The worrisome physical exam findings include -heart rate of 140 at presentation The initial radiographic and laboratory data are worrisome because of FOBT positive The chronic co-morbidities include with history of thyroid disease, prediabetes, obesity, neuropathy, hypertension, hyperlipidemia, GERD       * I certify that at the point of admission it is my clinical judgment that the patient will require inpatient hospital care spanning beyond 2 midnights from the point of admission due to high intensity of service, high risk for further  deterioration and high frequency of surveillance required.*  Time spent in minutes : [place]

## 2020-01-29 NOTE — Progress Notes (Signed)
Transported to ENDO.

## 2020-01-29 NOTE — Consult Note (Signed)
@LOGO @   Referring Provider: Rolla Plate DO Primary Care Physician:  Fayrene Helper, MD Primary Gastroenterologist:  Dr. Abbey Chatters (previously Dr. Oneida Alar)  Date of Admission: 01/28/20 Date of Consultation: 01/29/20  Reason for Consultation:  Melena, heme positive stool  HPI:  Victoria Reyes is a 69 y.o. year old female with history of prediabetes, obesity, neuropathy, hypothyroidism, HTN, HLD, diverticulosis, adenomatous colon polyps, GERD, hiatal hernia who presented to the emergency room 10/18 with chief complaint of melena and burgundy colored stools.  Hemoglobin 12.5.  FOBT positive.  BUN within normal limits.  WBC 14.0.  Mild hypokalemia with potassium 3.4.  She was initially tachycardic, but this improved. Otherwise, hemodynamically stable. Afebrile.   She was placed on IV Protonix twice daily, made n.p.o., and GI was consulted.  She is also given 1 L LR bolus and started on 75 mL/h infusion of LR.  This morning, hemoglobin is down to 11, WBC slightly elevated at 12.1.  BUN remains normal.  Hypokalemia corrected 3.8.  Today:  After dinner yesterday, she had a black BM. Wiped and saw cranberry red color on the toilet tissue. Also with dark red color in toilet water.Occurred once at home and once here in the emergency department about 3 hours ago. This is the first time this has ever happened. No abdominal pain. Stools were formed. No fever or chills. No nausea or vomiting. No weight loss.   Takes omeprazole as needed for GERD symptoms. Maybe once a month. No dysphagia.   No pepto bismol or iron. Has a lot of hip/back pain. Has been taking ibuprofen several days a week for several months. No blood thinners.   No family history of GI cancer.   No prior EGD.   Colonoscopy 2012 for heme positive stool.  Unable to see complete report.  Pathology with tubular adenoma.  Per telephone note, recommended repeat in 2022.   Past Medical History:  Diagnosis Date  . Anemia     as a  young woman  . Diverticulitis 2005   CT   . DJD (degenerative joint disease)   . GERD (gastroesophageal reflux disease)   . Headache(784.0)   . Hiatal hernia   . Hyperlipidemia   . Hypertension   . Hypothyroidism   . Knee pain, right   . Low back pain   . Neuropathy    peroneal nerve  . Obesity   . Pneumonia    as child  . Pre-diabetes   . S/P colonoscopy 2005   Dr. Tamala Julian: sigmoid diverticulosis  . Thyroid disease   . Tobacco abuse     Past Surgical History:  Procedure Laterality Date  . BACK SURGERY    . COLONOSCOPY  12/18/2010   Procedure: COLONOSCOPY;  Surgeon: Dorothyann Peng, MD;  Location: [facility];  Service: Endoscopy;  Laterality: N/A;  10:15am  . cyst removal right wrist    . EYE SURGERY Bilateral    cataract surgery with lens implant  . TONSILLECTOMY    . TOTAL SHOULDER ARTHROPLASTY Left 12/21/2016   Procedure: TOTAL SHOULDER ARTHROPLASTY;  Surgeon: Meredith Pel, MD;  Location: [place];  Service: Orthopedics;  Laterality: Left;  . TUBAL LIGATION    . umbillical hernia      Prior to Admission medications   Medication Sig Start Date End Date Taking? Authorizing Provider  amLODipine (NORVASC) 5 MG tablet TAKE 1 TABLET BY MOUTH EVERY DAY 09/06/19   Fayrene Helper, MD  atorvastatin (LIPITOR) 40 MG tablet  Take 1 tablet (40 mg total) by mouth daily. 09/13/19   Pahwani, Michell Heinrich, MD  levothyroxine (SYNTHROID) 100 MCG tablet Take 1 tablet (100 mcg total) by mouth daily. 09/13/19   Pahwani, Michell Heinrich, MD  omeprazole (PRILOSEC) 40 MG capsule TAKE 1 CAPSULE BY MOUTH EVERY DAY 08/30/19   Fayrene Helper, MD    Current Facility-Administered Medications  Medication Dose Route Frequency Provider Last Rate Last Admin  . acetaminophen (TYLENOL) tablet 650 mg  650 mg Oral Q6H PRN Zierle-Ghosh, Asia B, DO       Or  . acetaminophen (TYLENOL) suppository 650 mg  650 mg Rectal Q6H PRN Zierle-Ghosh, Asia B, DO      . amLODipine (NORVASC) tablet 5 mg  5 mg Oral Daily  Zierle-Ghosh, Asia B, DO      . atorvastatin (LIPITOR) tablet 40 mg  40 mg Oral Daily Zierle-Ghosh, Asia B, DO      . lactated ringers infusion   Intravenous Continuous Zierle-Ghosh, Asia B, DO 75 mL/hr at 01/29/20 0055 New Bag at 01/29/20 0055  . levothyroxine (SYNTHROID) tablet 100 mcg  100 mcg Oral Daily Zierle-Ghosh, Asia B, DO      . ondansetron (ZOFRAN) tablet 4 mg  4 mg Oral Q6H PRN Zierle-Ghosh, Asia B, DO       Or  . ondansetron (ZOFRAN) injection 4 mg  4 mg Intravenous Q6H PRN Zierle-Ghosh, Asia B, DO      . pantoprazole (PROTONIX) injection 40 mg  40 mg Intravenous Q12H Zierle-Ghosh, Asia B, DO   40 mg at 01/28/20 2327    Allergies as of 01/28/2020 - Review Complete 01/28/2020  Allergen Reaction Noted  . Hydrocodone Nausea Only 06/14/2006  . Sulfa antibiotics Itching 08/03/2011    Family History  Problem Relation Age of Onset  . Heart attack Sister        pacemaker  . Kidney failure Sister        on dialysis  . Colon cancer Neg Hx     Social History   Socioeconomic History  . Marital status: Widowed    Spouse name: Not on file  . Number of children: Not on file  . Years of education: 39  . Highest education level: Some college, no degree  Occupational History  . Not on file  Tobacco Use  . Smoking status: Former Smoker    Packs/day: 0.25    Years: 45.00    Pack years: 11.25    Types: Cigarettes    Start date: 09/30/1963    Quit date: 08/15/2015    Years since quitting: 4.4  . Smokeless tobacco: Never Used  Vaping Use  . Vaping Use: Never used  Substance and Sexual Activity  . Alcohol use: No    Alcohol/week: 0.0 standard drinks  . Drug use: No  . Sexual activity: Not Currently    Birth control/protection: Post-menopausal  Other Topics Concern  . Not on file  Social History Narrative  . Not on file   Social Determinants of Health   Financial Resource Strain: Low Risk   . Difficulty of Paying Living Expenses: Not hard at all  Food Insecurity: No  Food Insecurity  . Worried About Charity fundraiser in the Last Year: Never true  . Ran Out of Food in the Last Year: Never true  Transportation Needs: No Transportation Needs  . Lack of Transportation (Medical): No  . Lack of Transportation (Non-Medical): No  Physical Activity: Insufficiently Active  . Days of Exercise  per Week: 3 days  . Minutes of Exercise per Session: 30 min  Stress: No Stress Concern Present  . Feeling of Stress : Not at all  Social Connections: Socially Isolated  . Frequency of Communication with Friends and Family: More than three times a week  . Frequency of Social Gatherings with Friends and Family: More than three times a week  . Attends Religious Services: Never  . Active Member of Clubs or Organizations: No  . Attends Archivist Meetings: Never  . Marital Status: Widowed  Intimate Partner Violence: Not At Risk  . Fear of Current or Ex-Partner: No  . Emotionally Abused: No  . Physically Abused: No  . Sexually Abused: No    Review of Systems: Gen: Denies fever, chills, cold or flu like symptoms, pre-syncope, or syncope.  CV: Denies chest pain or heart palpitations Resp: Denies shortness of breath or cough.  GI: See HPI GU : Denies urinary burning or urinary incontinence. Admits to increased frequency.  MS: See HPI Heme: See HPI.  Physical Exam: Vital signs in last 24 hours: Temp:  [98.1 F (36.7 C)-98.4 F (36.9 C)] 98.1 F (36.7 C) (10/19 0751) Pulse Rate:  [65-120] 66 (10/19 0746) Resp:  [16-27] 17 (10/19 0746) BP: (124-142)/(59-89) 142/73 (10/19 0748) SpO2:  [96 %-100 %] 100 % (10/19 0746) Weight:  [95.3 kg] 95.3 kg (10/18 2057)   General:   Alert,  Well-developed, well-nourished, pleasant and cooperative in NAD Head:  Normocephalic and atraumatic. Eyes:  Sclera clear, no icterus.   Conjunctiva pink. Ears:  Normal auditory acuity. Lungs:  Clear throughout to auscultation.   No wheezes, crackles, or rhonchi. No acute  distress. Heart:  Regular rate and rhythm; no murmurs, clicks, rubs,  or gallops. Abdomen:  Soft, nontender and nondistended. No masses, hepatosplenomegaly or hernias noted. Normal bowel sounds, without guarding, and without rebound.   Rectal:  Perianal skin tag and external hemorrhoid. Stool appears melanotic with some dark blood noted perianally. No bright red blood.  Msk:  Symmetrical without gross deformities. Normal posture. Extremities:  Without edema. Neurologic:  Alert and  oriented x4;  grossly normal neurologically. Skin:  Intact without significant lesions or rashes. Psych: Normal mood and affect.  Intake/Output from previous day: No intake/output data recorded. Intake/Output this shift: No intake/output data recorded.  Lab Results: Recent Labs    01/28/20 2146 01/29/20 0501  WBC 14.0* 12.1*  HGB 12.5 11.0*  HCT 39.6 35.8*  PLT 185 309   BMET Recent Labs    01/28/20 2146 01/29/20 0501  NA 140 140  K 3.4* 3.8  CL 104 106  CO2 23 25  GLUCOSE 105* 95  BUN 23 18  CREATININE 0.70 0.59  CALCIUM 9.1 8.7*   LFT Recent Labs    01/28/20 2146 01/29/20 0501  PROT 7.3 6.6  ALBUMIN 3.8 3.5  AST 16 15  ALT 12 11  ALKPHOS 107 12  BILITOT 0.7 0.7   PT/INR Recent Labs    01/29/20 0501  LABPROT 12.8  INR 1.0   Impression: 69 year old female with history of prediabetes, obesity, neuropathy, hypothyroidism, HTN, HLD, diverticulosis, tubular adenoma in 2012 (recommended repeat in 2022), GERD, hiatal hernia who presented to the emergency room 10/18 with chief complaint of melena and dark red blood per rectum that started the evening of 10/18. Hemoglobin 12.5 on admission, down to 11.0 this morning. FOBT positive. Melanotic stool on rectal exam. BUN remains within normal limits. She had tachycardia upon presentation, but  this resolved, and she has remaines hemodynamically stable. Last episode of melena was about 3 hours ago with a total of 2 episodes. Admits to ibuprofen  use fairly routinely for right hip and back pain. Taking omeprazole rarely for occasional GERD symptoms. No abdominal pain, dysphagia, or any other significant  GI symptoms.   Concerned for upper GI bleed possibly secondary to gastritis, duodenitis, esophagitis, PUD, cameron erosions, or AVMs in the setting of routine NSAID use. She does have history tubular adenoma, and I can't rule out right sided colonic lesion. Not consistent with diverticular bleed.  We will plan for EGD with propofol with Dr. Jenetta Downer today. She has been NPO since midnight.    Plan: Proceed with EGD with propofol with Dr. Jenetta Downer today. The risks, benefits, and alternatives have been discussed with the patient in detail. The patient states understanding and desires to proceed.  ASA II Keep NPO Continue IV PPI BID.  She will need to avoid NSAIDs moving forward.      LOS: 1 day    01/29/2020, 9:21 AM   Aliene Altes, PA-C [facility]

## 2020-01-29 NOTE — Brief Op Note (Signed)
01/28/2020 - 01/29/2020  1:07 PM  PATIENT:  Victoria Reyes  69 y.o. female  PRE-OPERATIVE DIAGNOSIS:  melena  POST-OPERATIVE DIAGNOSIS:  gastric ulcers, duodenal ulcers,   PROCEDURE:  Procedure(s): ESOPHAGOGASTRODUODENOSCOPY (EGD) WITH PROPOFOL (N/A) BIOPSY  SURGEON:  Surgeon(s) and Role:    * Harvel Quale, MD - Primary  Perform EGD today under propofol.  Patient had presence of 3 ulcers with clean base in the incisura.  She was also found to have 5 cratered ulcers in her duodenal bulb and first portion of the duodenum with significant edema of the duodenal sweep.  2 of the ulcers had some hematin at the rate but no vessel was identified.  2 of the ulcers had a spontaneous oozing of blood.  This was ablated with bipolar probe.  Biopsies were taken from the stomach to rule out H. Pylori.  Constellation of findings is likely related to NSAID use.  Recommendations - Return patient to hospital ward for ongoing care.  - Clear liquid diet, advance tomorrow if tolerated.  - Continue pantoprazole 40 mg BID IV. - Await pathology results.  - No ibuprofen, naproxen, or other non-steroidal anti-inflammatory drugs.   Maylon Peppers, MD Gastroenterology and Hepatology [facility]

## 2020-01-29 NOTE — Op Note (Signed)
[facility] Patient Name: Victoria Reyes Procedure Date: 01/29/2020 12:11 PM MRN: [id number] Date of Birth: July 26, 1950 Attending MD: Maylon Peppers ,  CSN: [id number] Age: 69 Admit Type: Inpatient Procedure:                Upper GI endoscopy Indications:              Melena Providers:                Maylon Peppers, [place] Sharon Seller, RN, Rosina Lowenstein, RN Referring MD:              Medicines:                Monitored Anesthesia Care Complications:            No immediate complications. Estimated Blood Loss:     Estimated blood loss: none. Procedure:                Pre-Anesthesia Assessment:                           - Prior to the procedure, a History and Physical                            was performed, and patient medications, allergies                            and sensitivities were reviewed. The patient's                            tolerance of previous anesthesia was reviewed.                           - The risks and benefits of the procedure and the                            sedation options and risks were discussed with the                            patient. All questions were answered and informed                            consent was obtained.                           - ASA Grade Assessment: III - A patient with severe                            systemic disease.                           After obtaining informed consent, the endoscope was                            passed under direct vision. Throughout the  procedure, the patient's blood pressure, pulse, and                            oxygen saturations were monitored continuously. The                            GIF-H190 ([id number]) scope was introduced through the                            mouth, and advanced to the second part of duodenum.                            The upper GI endoscopy was accomplished without                            difficulty.  The patient tolerated the procedure                            well. Scope In: 12:30:55 PM Scope Out: 12:48:59 PM Total Procedure Duration: 0 hours 18 minutes 4 seconds  Findings:      The examined esophagus was normal.      Three non-bleeding superficial gastric ulcers with a clean ulcer base       (Forrest Class III) were found at the incisura. The largest lesion was 6       mm in largest dimension. Biopsies were taken with a cold forceps for       Helicobacter pylori testing.      Five non-bleeding cratered and superficial duodenal ulcers with a clean       base but some hematin (Forrest Class IIc) were found in the duodenal       bulb and in the first portion of the duodenum. The largest lesion was 10       mm in largest dimension. There was significant edema at the duodenal       sweep. Two ulcers were noted to be oozing spontaneously upon       reinspection. Coagulation for hemostasis using bipolar probe was       successful at these two sites. Impression:               - Normal esophagus.                           - Non-bleeding gastric ulcers with a clean ulcer                            base (Forrest Class III). Biopsied.                           - Oozing duodenal ulcers with clean base but some                            hematin (Forrest Class IIc). A couple of them had                            spontaneous oozing of blood. Treated with bipolar  cautery. Moderate Sedation:      Per Anesthesia Care Recommendation:           - Return patient to hospital ward for ongoing care.                           - Clear liquid diet, advance tomorrow if tolerated.                           - Continue pantoprazole 40 mg BID IV.                           - Await pathology results.                           - No ibuprofen, naproxen, or other non-steroidal                            anti-inflammatory drugs. Procedure Code(s):        --- Professional ---                            27062, 59,GC, Esophagogastroduodenoscopy, flexible,                            transoral; with control of bleeding, any method                           43239, Esophagogastroduodenoscopy, flexible,                            transoral; with biopsy, single or multiple Diagnosis Code(s):        --- Professional ---                           K25.9, Gastric ulcer, unspecified as acute or                            chronic, without hemorrhage or perforation                           K26.4, Chronic or unspecified duodenal ulcer with                            hemorrhage                           K92.1, Melena (includes Hematochezia) CPT copyright 2019 [employer]. All rights reserved. The codes documented in this report are preliminary and upon coder review may  be revised to meet current compliance requirements. Maylon Peppers, MD Maylon Peppers,  01/29/2020 1:05:28 PM This report has been signed electronically. Number of Addenda: 0

## 2020-01-30 ENCOUNTER — Other Ambulatory Visit: Payer: Self-pay

## 2020-01-30 DIAGNOSIS — T39395A Adverse effect of other nonsteroidal anti-inflammatory drugs [NSAID], initial encounter: Secondary | ICD-10-CM

## 2020-01-30 DIAGNOSIS — K219 Gastro-esophageal reflux disease without esophagitis: Secondary | ICD-10-CM

## 2020-01-30 DIAGNOSIS — K921 Melena: Secondary | ICD-10-CM

## 2020-01-30 DIAGNOSIS — K259 Gastric ulcer, unspecified as acute or chronic, without hemorrhage or perforation: Secondary | ICD-10-CM

## 2020-01-30 DIAGNOSIS — K269 Duodenal ulcer, unspecified as acute or chronic, without hemorrhage or perforation: Secondary | ICD-10-CM

## 2020-01-30 LAB — CBC
HCT: 30 % — ABNORMAL LOW (ref 36.0–46.0)
Hemoglobin: 9.6 g/dL — ABNORMAL LOW (ref 12.0–15.0)
MCH: 28.2 pg (ref 26.0–34.0)
MCHC: 32 g/dL (ref 30.0–36.0)
MCV: 88.2 fL (ref 80.0–100.0)
Platelets: 269 10*3/uL (ref 150–400)
RBC: 3.4 MIL/uL — ABNORMAL LOW (ref 3.87–5.11)
RDW: 14.7 % (ref 11.5–15.5)
WBC: 8 10*3/uL (ref 4.0–10.5)
nRBC: 0 % (ref 0.0–0.2)

## 2020-01-30 LAB — BASIC METABOLIC PANEL
Anion gap: 10 (ref 5–15)
BUN: 10 mg/dL (ref 8–23)
CO2: 26 mmol/L (ref 22–32)
Calcium: 8.5 mg/dL — ABNORMAL LOW (ref 8.9–10.3)
Chloride: 104 mmol/L (ref 98–111)
Creatinine, Ser: 0.69 mg/dL (ref 0.44–1.00)
GFR, Estimated: >60 mL/min (ref >60)
Glucose, Bld: 87 mg/dL (ref 70–99)
Potassium: 3.8 mmol/L (ref 3.5–5.1)
Sodium: 140 mmol/L (ref 135–145)

## 2020-01-30 LAB — MAGNESIUM: Magnesium: 2 mg/dL (ref 1.7–2.4)

## 2020-01-30 LAB — SURGICAL PATHOLOGY

## 2020-01-30 LAB — T4, FREE: Free T4: 1.24 ng/dL — ABNORMAL HIGH (ref 0.61–1.12)

## 2020-01-30 NOTE — Progress Notes (Signed)
PROGRESS NOTE  Victoria Reyes XNA:[id number] DOB: [birth year] DOA: 01/28/2020 PCP: Fayrene Helper, MD  Brief History:  69 year old female with a history of impaired glucose tolerance, hypertension, hyperlipidemia, diverticulosis, GERD presenting with melena and burgundy colored stools.  The patient stated that she noted melena after dinner on 01/28/2020.  She also saw a cranberry red color on the toilet tissue when she wiped.  It occurred at home and once in the emergency department.  The patient denied fevers, chills, chest pain, shortness breath, nausea, vomiting, diarrhea.  There is been no recent weight loss.  In the emergency department, the patient was noted to have hemoglobin of 12.5 with normal BUN.  Patient states that she has been taking ibuprofen several days per week for several months.  GI was consulted to assist with management.  The patient underwent EGD on 01/29/2020 which showed 3 clean-based ulcers in the stomach, 5 cratered ulcers in the duodenal bulb, significant edema of the duodenal sweep, 2 ulcers with spontaneous oozing blood which were ablated with the bipolar probe.  She was started on Protonix IV twice daily.  Assessment/Plan: Melena/upper GI bleed -Appreciate GI consult -01/29/2020 EGD as discussed above -Continue PPI twice daily -Avoid NSAIDs -Advance diet -Drop in Hgb partly dilutional  Essential hypertension -Continue amlodipine  Hyperlipidemia -Continue statin  Hypothyroidism -Continue Synthroid  Impaired glucose tolerance -Lifestyle modification  Class II obesity -BMI 38.24 -Lifestyle modification      Status is: Inpatient  Remains inpatient appropriate because:Inpatient level of care appropriate due to severity of illness   Dispo: The patient is from: Home              Anticipated d/c is to: Home              Anticipated d/c date is: 1 day              Patient currently is not medically stable to  d/c.        Family Communication:  no Family at bedside  Consultants:  GI  Code Status:  FULL   DVT Prophylaxis:  SCDs   Procedures: As Listed in Progress Note Above  Antibiotics: None       Subjective: Patient denies fevers, chills, headache, chest pain, dyspnea, nausea, vomiting, diarrhea, abdominal pain, dysuria, hematuria, hematochezia, and melena.   Objective: Vitals:   01/29/20 2104 01/30/20 0202 01/30/20 0500 01/30/20 1439  BP:  (!) 118/50 (!) 109/57 (!) 118/59  Pulse:  76 67 69  Resp:  16 18 17   Temp:  98.3 F (36.8 C) 98.3 F (36.8 C) 97.9 F (36.6 C)  TempSrc:  Oral  Oral  SpO2: 100% 99% 96% 98%  Weight:   93.3 kg   Height:        Intake/Output Summary (Last 24 hours) at 01/30/2020 1509 Last data filed at 01/30/2020 0400 Gross per 24 hour  Intake 1438.07 ml  Output 100 ml  Net 1338.07 ml   Weight change: -0 kg Exam:   General:  Pt is alert, follows commands appropriately, not in acute distress  HEENT: No icterus, No thrush, No neck mass, [place]/AT  Cardiovascular: RRR, S1/S2, no rubs, no gallops  Respiratory: CTA bilaterally, no wheezing, no crackles, no rhonchi  Abdomen: Soft/+BS, non tender, non distended, no guarding  Extremities: Nonpitting edema, No lymphangitis, No petechiae, No rashes, no synovitis   Data Reviewed: I have personally reviewed following labs and imaging studies Basic Metabolic  Panel: Recent Labs  Lab 01/28/20 2146 01/29/20 0501 01/30/20 0419  NA 140 140 140  K 3.4* 3.8 3.8  CL 104 106 104  CO2 23 25 26   GLUCOSE 105* 95 87  BUN 23 18 10   CREATININE 0.70 0.59 0.69  CALCIUM 9.1 8.7* 8.5*  MG  --  2.0 2.0   Liver Function Tests: Recent Labs  Lab 01/28/20 2146 01/29/20 0501  AST 16 15  ALT 12 11  ALKPHOS 107 91  BILITOT 0.7 0.7  PROT 7.3 6.6  ALBUMIN 3.8 3.5   No results for input(s): LIPASE, AMYLASE in the last 168 hours. No results for input(s): AMMONIA in the last 168 hours. Coagulation  Profile: Recent Labs  Lab 01/29/20 0501  INR 1.0   CBC: Recent Labs  Lab 01/28/20 2146 01/29/20 0501 01/30/20 0419  WBC 14.0* 12.1* 8.0  HGB 12.5 11.0* 9.6*  HCT 39.6 35.8* 30.0*  MCV 86.8 88.8 88.2  PLT 185 309 269   Cardiac Enzymes: No results for input(s): CKTOTAL, CKMB, CKMBINDEX, TROPONINI in the last 168 hours. BNP: Invalid input(s): POCBNP CBG: No results for input(s): GLUCAP in the last 168 hours. HbA1C: No results for input(s): HGBA1C in the last 72 hours. Urine analysis:    Component Value Date/Time   COLORURINE YELLOW 01/28/2020 2151   APPEARANCEUR CLEAR 01/28/2020 2151   LABSPEC 1.015 01/28/2020 2151   PHURINE 5.0 01/28/2020 2151   GLUCOSEU NEGATIVE 01/28/2020 2151   HGBUR NEGATIVE 01/28/2020 2151   HGBUR negative 10/06/2007 1017   BILIRUBINUR NEGATIVE 01/28/2020 2151   BILIRUBINUR negative 07/26/2017 [place] 01/28/2020 2151   PROTEINUR NEGATIVE 01/28/2020 2151   UROBILINOGEN 0.2 07/26/2017 1151   UROBILINOGEN 0.2 10/06/2007 1017   NITRITE NEGATIVE 01/28/2020 2151   LEUKOCYTESUR NEGATIVE 01/28/2020 2151   Sepsis Labs: @LABRCNTIP (procalcitonin:4,lacticidven:4) ) Recent Results (from the past 240 hour(s))  Respiratory Panel by RT PCR (Flu A&B, Covid) - Nasopharyngeal Swab     Status: None   Collection Time: 01/28/20 10:50 PM   Specimen: Nasopharyngeal Swab  Result Value Ref Range Status   SARS Coronavirus 2 by RT PCR NEGATIVE NEGATIVE Final    Comment: (NOTE) SARS-CoV-2 target nucleic acids are NOT DETECTED.  The SARS-CoV-2 RNA is generally detectable in upper respiratoy specimens during the acute phase of infection. The lowest concentration of SARS-CoV-2 viral copies this assay can detect is 131 copies/mL. A negative result does not preclude SARS-Cov-2 infection and should not be used as the sole basis for treatment or other patient management decisions. A negative result may occur with  improper specimen collection/handling,  submission of specimen other than nasopharyngeal swab, presence of viral mutation(s) within the areas targeted by this assay, and inadequate number of viral copies (<131 copies/mL). A negative result must be combined with clinical observations, patient history, and epidemiological information. The expected result is Negative.  Fact Sheet for Patients:  PinkCheek.be  Fact Sheet for Healthcare Providers:  GravelBags.it  This test is no t yet approved or cleared by the [place] FDA and  has been authorized for detection and/or diagnosis of SARS-CoV-2 by FDA under an Emergency Use Authorization (EUA). This EUA will remain  in effect (meaning this test can be used) for the duration of the COVID-19 declaration under Section 564(b)(1) of the Act, 21 U.S.C. section 360bbb-3(b)(1), unless the authorization is terminated or revoked sooner.     Influenza A by PCR NEGATIVE NEGATIVE Final   Influenza B by PCR NEGATIVE NEGATIVE Final  Comment: (NOTE) The Xpert Xpress SARS-CoV-2/FLU/RSV assay is intended as an aid in  the diagnosis of influenza from Nasopharyngeal swab specimens and  should not be used as a sole basis for treatment. Nasal washings and  aspirates are unacceptable for Xpert Xpress SARS-CoV-2/FLU/RSV  testing.  Fact Sheet for Patients: PinkCheek.be  Fact Sheet for Healthcare Providers: GravelBags.it  This test is not yet approved or cleared by the [place] FDA and  has been authorized for detection and/or diagnosis of SARS-CoV-2 by  FDA under an Emergency Use Authorization (EUA). This EUA will remain  in effect (meaning this test can be used) for the duration of the  Covid-19 declaration under Section 564(b)(1) of the Act, 21  U.S.C. section 360bbb-3(b)(1), unless the authorization is  terminated or revoked. Performed at [facility], [address], [place], [place] [address]      Scheduled Meds: . amLODipine  5 mg Oral Daily  . atorvastatin  40 mg Oral Daily  . levothyroxine  100 mcg Oral Daily  . pantoprazole (PROTONIX) IV  40 mg Intravenous Q12H   Continuous Infusions: . lactated ringers 75 mL/hr at 01/29/20 1920    Procedures/Studies: No results found.  Orson Eva, DO  [facility]  If 7PM-7AM, please contact night-coverage [email or web address] Password [email or web address] 01/30/2020, 3:09 PM   LOS: 2 days

## 2020-01-30 NOTE — Progress Notes (Signed)
GI Inpatient Follow-up Note  Patient Identification: Victoria Reyes is a 69 y.o. female with PMHx of prediabetes, obesity, neuropathy, hypothyroidism, hypertension,hyperlipidemia, diverticulosis admitted on 01/28/20 with melena/possible hematochezia and positive iFOBT. Had EGD 01/29/20   Subjective: She did not have a bowel movement overnight. She does not have any abdominal pain, nausea, vomiting. She is tolerating her clear liquid diet well. She has not had any additional stools that were dark since the one that prompted admission.  Prior to admission she was on ibuprofen 600 mg 3 times daily and often taking on empty stomach. Generally is taking this for hip pain.  Scheduled Inpatient Medications:  . amLODipine  5 mg Oral Daily  . atorvastatin  40 mg Oral Daily  . levothyroxine  100 mcg Oral Daily  . pantoprazole (PROTONIX) IV  40 mg Intravenous Q12H    Continuous Inpatient Infusions:   . lactated ringers 75 mL/hr at 01/29/20 1920    PRN Inpatient Medications:  acetaminophen **OR** acetaminophen, ondansetron **OR** ondansetron (ZOFRAN) IV  Review of Systems: Constitutional: Weight is stable.  Eyes: No changes in vision. ENT: No oral lesions, sore throat.  GI: see HPI.  Heme/Lymph: No easy bruising.  CV: No chest pain.  GU: No hematuria.  Integumentary: No rashes.  Neuro: No headaches.  Psych: No depression/anxiety.  Endocrine: No heat/cold intolerance.  Allergic/Immunologic: No urticaria.  Resp: No cough, SOB.  Musculoskeletal: No joint swelling.    Physical Examination: BP (!) 109/57   Pulse 67   Temp 98.3 F (36.8 C)   Resp 18   Ht 5' 1.5" (1.562 m)   Wt 93.3 kg   SpO2 96%   BMI 38.24 kg/m  Gen: NAD, alert and oriented x 4 HEENT: PEERLA, EOMI, Neck: supple, no JVD or thyromegaly Chest: CTA bilaterally, no wheezes, crackles, or other adventitious sounds CV: RRR, no m/g/c/r Abd: soft, NT, ND, +BS in all four quadrants; no HSM, guarding, ridigity, or  rebound tenderness Ext: no edema, well perfused with 2+ pulses, Skin: no rash or lesions noted Lymph: no LAD  Data: Lab Results  Component Value Date   WBC 8.0 01/30/2020   HGB 9.6 (L) 01/30/2020   HCT 30.0 (L) 01/30/2020   MCV 88.2 01/30/2020   PLT 269 01/30/2020   Recent Labs  Lab 01/28/20 2146 01/29/20 0501 01/30/20 0419  HGB 12.5 11.0* 9.6*   Lab Results  Component Value Date   NA 140 01/30/2020   K 3.8 01/30/2020   CL 104 01/30/2020   CO2 26 01/30/2020   BUN 10 01/30/2020   CREATININE 0.69 01/30/2020   Lab Results  Component Value Date   ALT 11 01/29/2020   AST 15 01/29/2020   ALKPHOS 91 01/29/2020   BILITOT 0.7 01/29/2020   Recent Labs  Lab 01/29/20 0501  INR 1.0     EGD yesterday - Perform EGD today under propofol.  Patient had presence of 3 ulcers with clean base in the incisura.  She was also found to have 5 cratered ulcers in her duodenal bulb and first portion of the duodenum with significant edema of the duodenal sweep.  2 of the ulcers had some hematin at the rate but no vessel was identified.  2 of the ulcers had a spontaneous oozing of blood.  This was ablated with bipolar probe.  Biopsies were taken from the stomach to rule out H. Pylori.  Constellation of findings is likely related to NSAID use.   Assessment/Plan: Ms. Scheurich is a 69 y.o.  female admitted w/ melena w/ EGD as above.  1. Anemia - hgb dropped from 12.5 on admission 01/28/20 to 9.6 this AM but clinically she has not had any further evidence of bleeding. She is doing well on clear liquid diet and will advance to regular soft diet. Continue IV PPI BID. CBC in a.m. Will have to discontinue NSAIDs at discharge.  Colonoscopy 2012 (for + hemoccult) w/ sessile polyp hepatic flexure, path w/ Tubular adenoma, repeat as outpatient unless hemoglobin continues to drop or has further episode of blood in stool   Will follow up on H pylori reults when available  Case discussed w/ Dr Laural Golden       Please call with questions or concerns.    Ronney Asters, PA-C [facility]

## 2020-01-31 ENCOUNTER — Encounter (HOSPITAL_COMMUNITY): Payer: Self-pay | Admitting: Gastroenterology

## 2020-01-31 ENCOUNTER — Telehealth: Payer: Self-pay | Admitting: Gastroenterology

## 2020-01-31 DIAGNOSIS — K269 Duodenal ulcer, unspecified as acute or chronic, without hemorrhage or perforation: Secondary | ICD-10-CM

## 2020-01-31 DIAGNOSIS — K259 Gastric ulcer, unspecified as acute or chronic, without hemorrhage or perforation: Secondary | ICD-10-CM | POA: Diagnosis not present

## 2020-01-31 DIAGNOSIS — K922 Gastrointestinal hemorrhage, unspecified: Secondary | ICD-10-CM

## 2020-01-31 LAB — BASIC METABOLIC PANEL
Anion gap: 10 (ref 5–15)
BUN: 9 mg/dL (ref 8–23)
CO2: 27 mmol/L (ref 22–32)
Calcium: 8.5 mg/dL — ABNORMAL LOW (ref 8.9–10.3)
Chloride: 105 mmol/L (ref 98–111)
Creatinine, Ser: 0.66 mg/dL (ref 0.44–1.00)
GFR, Estimated: >60 mL/min (ref >60)
Glucose, Bld: 92 mg/dL (ref 70–99)
Potassium: 4 mmol/L (ref 3.5–5.1)
Sodium: 142 mmol/L (ref 135–145)

## 2020-01-31 LAB — CBC
HCT: 31.1 % — ABNORMAL LOW (ref 36.0–46.0)
Hemoglobin: 9.9 g/dL — ABNORMAL LOW (ref 12.0–15.0)
MCH: 28.5 pg (ref 26.0–34.0)
MCHC: 31.8 g/dL (ref 30.0–36.0)
MCV: 89.6 fL (ref 80.0–100.0)
Platelets: 261 10*3/uL (ref 150–400)
RBC: 3.47 MIL/uL — ABNORMAL LOW (ref 3.87–5.11)
RDW: 14.8 % (ref 11.5–15.5)
WBC: 8 10*3/uL (ref 4.0–10.5)
nRBC: 0 % (ref 0.0–0.2)

## 2020-01-31 LAB — MAGNESIUM: Magnesium: 2 mg/dL (ref 1.7–2.4)

## 2020-01-31 MED ORDER — AMOXICILLIN 500 MG PO TABS: 1000.0000 mg | ORAL_TABLET | Freq: Two times a day (BID) | ORAL | 0 refills | Status: DC

## 2020-01-31 MED ORDER — OMEPRAZOLE 40 MG PO CPDR
40.0000 mg | DELAYED_RELEASE_CAPSULE | Freq: Every day | ORAL | 1 refills | Status: DC
Start: 2020-01-31 — End: 2020-03-11

## 2020-01-31 MED ORDER — OMEPRAZOLE 40 MG PO CPDR: 40.0000 mg | DELAYED_RELEASE_CAPSULE | Freq: Two times a day (BID) | ORAL | 0 refills | Status: DC

## 2020-01-31 MED ORDER — CLARITHROMYCIN 500 MG PO TABS: 500.0000 mg | ORAL_TABLET | Freq: Two times a day (BID) | ORAL | 0 refills | Status: DC

## 2020-01-31 NOTE — Discharge Summary (Signed)
Physician Discharge Summary  AUTYMN OMLOR XHB:[id number] DOB: 26-Jan-1951 DOA: 01/28/2020  PCP: Fayrene Helper, MD  Admit date: 01/28/2020 Discharge date: 01/31/2020  Admitted From: Home Disposition:  Home   Recommendations for Outpatient Follow-up:  1. Follow up with PCP in 1-2 weeks 2. Please obtain BMP/CBC in one week    Discharge Condition: Stable CODE STATUS: FULL Diet recommendation: Heart Healthy / Carb Modified    Brief/Interim Summary: 69 year old female with a history of impaired glucose tolerance, hypertension, hyperlipidemia, diverticulosis, GERD presenting with melena and burgundy colored stools.  The patient stated that she noted melena after dinner on 01/28/2020.  She also saw a cranberry red color on the toilet tissue when she wiped.  It occurred at home and once in the emergency department.  The patient denied fevers, chills, chest pain, shortness breath, nausea, vomiting, diarrhea.  There is been no recent weight loss.  In the emergency department, the patient was noted to have hemoglobin of 12.5 with normal BUN.  Patient states that she has been taking ibuprofen several days per week for several months.  GI was consulted to assist with management.  The patient underwent EGD on 01/29/2020 which showed 3 clean-based ulcers in the stomach, 5 cratered ulcers in the duodenal bulb, significant edema of the duodenal sweep, 2 ulcers with spontaneous oozing blood which were ablated with the bipolar probe.  She was started on Protonix IV twice daily.  Discharge Diagnoses:  Melena/upper GI bleed -Appreciate GI consult -01/29/2020 EGD as discussed above -Continue PPI -Avoid NSAIDs -Advance diet which pt tolerated -Drop in Hgb partly dilutional then remained stable -hgb 9.9 at d/c H.Pylori positive-->dc home with Omeprazole  40mg  BID, amoxicillin 1000mg  BID, Biaxin 500mg  BID for 14 days. HOLD lipitor while on H.pylori treatment.   Essential hypertension -Continue  amlodipine -controlled  Hyperlipidemia -pt refuses statin -f/u PCP  Hypothyroidism -Continue Synthroid  Impaired glucose tolerance -Lifestyle modification  Class II obesity -BMI 38.24 -Lifestyle modification   Discharge Instructions   Allergies as of 01/31/2020      Reactions   Hydrocodone Nausea Only   REACTION: Nausea   Sulfa Antibiotics Itching      Medication List    STOP taking these medications   atorvastatin 40 MG tablet Commonly known as: LIPITOR     TAKE these medications   amLODipine 5 MG tablet Commonly known as: NORVASC TAKE 1 TABLET BY MOUTH EVERY DAY   amoxicillin 500 MG tablet Commonly known as: AMOXIL Take 2 tablets (1,000 mg total) by mouth 2 (two) times daily.   clarithromycin 500 MG tablet Commonly known as: BIAXIN Take 1 tablet (500 mg total) by mouth 2 (two) times daily.   levothyroxine 100 MCG tablet Commonly known as: SYNTHROID Take 1 tablet (100 mcg total) by mouth daily.   MULTIVITAMIN PO Take 1 tablet by mouth daily.   omeprazole 40 MG capsule Commonly known as: PRILOSEC Take 1 capsule (40 mg total) by mouth 2 (two) times daily. What changed: You were already taking a medication with the same name, and this prescription was added. Make sure you understand how and when to take each.   omeprazole 40 MG capsule Commonly known as: PRILOSEC Take 1 capsule (40 mg total) by mouth daily. Restart AFTER finished with new prescription for ulcers What changed:   how much to take  additional instructions       Allergies  Allergen Reactions  . Hydrocodone Nausea Only    REACTION: Nausea  . Sulfa Antibiotics Itching  Consultations:  GI   Procedures/Studies: No results found.      Discharge Exam: Vitals:   01/30/20 2142 01/31/20 0449  BP:  (!) 129/53  Pulse:  65  Resp:  18  Temp:  97.6 F (36.4 C)  SpO2: 92% 99%   Vitals:   01/30/20 2040 01/30/20 2142 01/31/20 0449 01/31/20 0500  BP: (!) 122/50   (!) 129/53   Pulse: 76  65   Resp: 17  18   Temp: 98 F (36.7 C)  97.6 F (36.4 C)   TempSrc: Oral  Oral   SpO2: 100% 92% 99%   Weight:    92.9 kg  Height:        General: Pt is alert, awake, not in acute distress Cardiovascular: RRR, S1/S2 +, no rubs, no gallops Respiratory: CTA bilaterally, no wheezing, no rhonchi Abdominal: Soft, NT, ND, bowel sounds + Extremities: non pitting LE edema, no cyanosis   The results of significant diagnostics from this hospitalization (including imaging, microbiology, ancillary and laboratory) are listed below for reference.    Significant Diagnostic Studies: No results found.   Microbiology: Recent Results (from the past 240 hour(s))  Respiratory Panel by RT PCR (Flu A&B, Covid) - Nasopharyngeal Swab     Status: None   Collection Time: 01/28/20 10:50 PM   Specimen: Nasopharyngeal Swab  Result Value Ref Range Status   SARS Coronavirus 2 by RT PCR NEGATIVE NEGATIVE Final    Comment: (NOTE) SARS-CoV-2 target nucleic acids are NOT DETECTED.  The SARS-CoV-2 RNA is generally detectable in upper respiratoy specimens during the acute phase of infection. The lowest concentration of SARS-CoV-2 viral copies this assay can detect is 131 copies/mL. A negative result does not preclude SARS-Cov-2 infection and should not be used as the sole basis for treatment or other patient management decisions. A negative result may occur with  improper specimen collection/handling, submission of specimen other than nasopharyngeal swab, presence of viral mutation(s) within the areas targeted by this assay, and inadequate number of viral copies (<131 copies/mL). A negative result must be combined with clinical observations, patient history, and epidemiological information. The expected result is Negative.  Fact Sheet for Patients:  PinkCheek.be  Fact Sheet for Healthcare Providers:  GravelBags.it  This  test is no t yet approved or cleared by the [place] FDA and  has been authorized for detection and/or diagnosis of SARS-CoV-2 by FDA under an Emergency Use Authorization (EUA). This EUA will remain  in effect (meaning this test can be used) for the duration of the COVID-19 declaration under Section 564(b)(1) of the Act, 21 U.S.C. section 360bbb-3(b)(1), unless the authorization is terminated or revoked sooner.     Influenza A by PCR NEGATIVE NEGATIVE Final   Influenza B by PCR NEGATIVE NEGATIVE Final    Comment: (NOTE) The Xpert Xpress SARS-CoV-2/FLU/RSV assay is intended as an aid in  the diagnosis of influenza from Nasopharyngeal swab specimens and  should not be used as a sole basis for treatment. Nasal washings and  aspirates are unacceptable for Xpert Xpress SARS-CoV-2/FLU/RSV  testing.  Fact Sheet for Patients: PinkCheek.be  Fact Sheet for Healthcare Providers: GravelBags.it  This test is not yet approved or cleared by the [place] FDA and  has been authorized for detection and/or diagnosis of SARS-CoV-2 by  FDA under an Emergency Use Authorization (EUA). This EUA will remain  in effect (meaning this test can be used) for the duration of the  Covid-19 declaration under Section 564(b)(1) of the Act,  21  U.S.C. section 360bbb-3(b)(1), unless the authorization is  terminated or revoked. Performed at [facility], [address], [place], [place] [address]      Labs: Basic Metabolic Panel: Recent Labs  Lab 01/28/20 2146 01/28/20 2146 01/29/20 0501 01/29/20 0501 01/30/20 0419 01/31/20 0631  NA 140  --  140  --  140 142  K 3.4*   < > 3.8   < > 3.8 4.0  CL 104  --  106  --  104 105  CO2 23  --  25  --  26 27  GLUCOSE 105*  --  95  --  87 92  BUN 23  --  18  --  10 9  CREATININE 0.70  --  0.59  --  0.69 0.66  CALCIUM 9.1  --  8.7*  --  8.5* 8.5*  MG  --   --  2.0  --  2.0 2.0   < > = values in this  interval not displayed.   Liver Function Tests: Recent Labs  Lab 01/28/20 2146 01/29/20 0501  AST 16 15  ALT 12 11  ALKPHOS 107 91  BILITOT 0.7 0.7  PROT 7.3 6.6  ALBUMIN 3.8 3.5   No results for input(s): LIPASE, AMYLASE in the last 168 hours. No results for input(s): AMMONIA in the last 168 hours. CBC: Recent Labs  Lab 01/28/20 2146 01/29/20 0501 01/30/20 0419 01/31/20 0631  WBC 14.0* 12.1* 8.0 8.0  HGB 12.5 11.0* 9.6* 9.9*  HCT 39.6 35.8* 30.0* 31.1*  MCV 86.8 88.8 88.2 89.6  PLT 185 309 269 261   Cardiac Enzymes: No results for input(s): CKTOTAL, CKMB, CKMBINDEX, TROPONINI in the last 168 hours. BNP: Invalid input(s): POCBNP CBG: No results for input(s): GLUCAP in the last 168 hours.  Time coordinating discharge:  36 minutes  Signed:  Orson Eva, DO [facility] Pager: [phone] 01/31/2020, 12:25 PM

## 2020-01-31 NOTE — Progress Notes (Signed)
Subjective:  Feels good. No abdominal pain. Solid black stool yesterday. No red/maroon color like before. Tolerating diet but doesn't like the food here.   Objective: Vital signs in last 24 hours: Temp:  [97.6 F (36.4 C)-98 F (36.7 C)] 97.6 F (36.4 C) (10/21 0449) Pulse Rate:  [65-76] 65 (10/21 0449) Resp:  [17-18] 18 (10/21 0449) BP: (118-129)/(50-59) 129/53 (10/21 0449) SpO2:  [92 %-100 %] 99 % (10/21 0449) Weight:  [92.9 kg] 92.9 kg (10/21 0500) Last BM Date: 01/29/20 General:   Alert,  Well-developed, well-nourished, pleasant and cooperative in NAD Head:  Normocephalic and atraumatic. Eyes:  Sclera clear, no icterus.  Abdomen:  Soft, nontender and nondistended.   Extremities:  Without clubbing, deformity or edema. Neurologic:  Alert and  oriented x4;  grossly normal neurologically. Skin:  Intact without significant lesions or rashes. Psych:  Alert and cooperative. Normal mood and affect.  Intake/Output from previous day: 10/20 0701 - 10/21 0700 In: 805.5 [I.V.:805.5] Out: 700 [Urine:700] Intake/Output this shift: No intake/output data recorded.  Lab Results: CBC Recent Labs    01/29/20 0501 01/30/20 0419 01/31/20 0631  WBC 12.1* 8.0 8.0  HGB 11.0* 9.6* 9.9*  HCT 35.8* 30.0* 31.1*  MCV 88.8 88.2 89.6  PLT 309 269 261   BMET Recent Labs    01/29/20 0501 01/30/20 0419 01/31/20 0631  NA 140 140 142  K 3.8 3.8 4.0  CL 106 104 105  CO2 25 26 27   GLUCOSE 95 87 92  BUN 18 10 9   CREATININE 0.59 0.69 0.66  CALCIUM 8.7* 8.5* 8.5*   LFTs Recent Labs    01/28/20 2146 01/29/20 0501  BILITOT 0.7 0.7  ALKPHOS 107 91  AST 16 15  ALT 12 11  PROT 7.3 6.6  ALBUMIN 3.8 3.5   No results for input(s): LIPASE in the last 72 hours. PT/INR Recent Labs    01/29/20 0501  LABPROT 12.8  INR 1.0      Imaging Studies: No results found.[2 weeks]   Assessment: 69 year old female presenting with episodes of melena and burgundy colored stools. History of  ibuprofen and naproxen on a regular basis for several months for arthritis.  GI bleed: Melena and maroon-colored stools. Hemoglobin dropped from 12.5-9.9. Has been stable the last 24 hours. EGD this admission showed 3 gastric ulcers with clean base. 5 ulcers in the duodenal bulb and first portion duodenum, 2 were noted to be oozing, treated with bipolar cautery. Gastric biopsies consistent with H. pylori gastritis.  H/o adenomatous colon polyps: colonoscopy in 2012. Consider at time of repeat EGD.   Plan: 1. Follow up with Dr. Abbey Chatters. 2. Patient will need repeat EGD in 10-12 weeks. Consider surveillance colonoscopy at the same time. 3. Avoid NSAIDs. 4. Recommend treat H.pylori at time of discharge.  Omeprazole 20mg  or 40mg  BID, amoxicillin 1000mg  BID, Biaxin 500mg  BID for 14 days. HOLD lipitor while on H.pylori treatment.  5. Recommend continue PPI BID until follow up EGD. 6. Stable for discharge from GI standpoint, will sign off. Call with questions.   Laureen Ochs. Bernarda Caffey [facility] [phone] 10/21/202110:59 AM     LOS: 3 days

## 2020-01-31 NOTE — Telephone Encounter (Signed)
Please schedule hospital follow up in 6-8 weeks with [facility] or myself. Patient needs repeat EGD+/-colonoscopy.

## 2020-02-04 ENCOUNTER — Telehealth: Payer: Self-pay

## 2020-02-04 NOTE — Telephone Encounter (Signed)
Transition Care Management Unsuccessful Follow-up Telephone Call  Date of discharge and from where:  [facility] 01/31/20  Attempts:  2nd Attempt  Reason for unsuccessful TCM follow-up call:  Left voice message

## 2020-02-04 NOTE — Telephone Encounter (Signed)
Transition Care Management Unsuccessful Follow-up Telephone Call  Date of discharge and from where: [facility] 01/31/20  Attempts:  1st Attempt  Reason for unsuccessful TCM follow-up call:  No answer/busy

## 2020-02-04 NOTE — Telephone Encounter (Signed)
Transition Care Management Follow-up Telephone Call  Date of discharge and from where: 01/31/20 [facility]  How have you been since you were released from the hospital? Just fine  Any questions or concerns? No  Items Reviewed:  Did the pt receive and understand the discharge instructions provided? Yes   Medications obtained and verified? Yes   Other? Yes   Any new allergies since your discharge? No   Dietary orders reviewed? No  Do you have support at home? Yes   Home Care and Equipment/Supplies: Were home health services ordered? not applicable If so, what is the name of the agency? [facility]  Has the agency set up a time to come to the patient's home? not applicable Were any new equipment or medical supplies ordered?  No What is the name of the medical supply agency? [facility] Were you able to get the supplies/equipment? not applicable Do you have any questions related to the use of the equipment or supplies? No  Functional Questionnaire: (I = Independent and D = Dependent) ADLs: i  Bathing/Dressing- i  Meal Prep- i  Eating- i  Maintaining continence- i  Transferring/Ambulation- i  Managing Meds- i  Follow up appointments reviewed:   PCP Hospital f/u appt confirmed? Yes  Scheduled to see Dr.Patel on 02/06/20 @ 9:40am.  [facility] f/u appt confirmed? Yes  Scheduled   Are transportation arrangements needed? No   If their condition worsens, is the pt aware to call PCP or go to the Emergency Dept.? Yes  Was the patient provided with contact information for the PCP's office or ED? Yes  Was to pt encouraged to call back with questions or concerns? Yes

## 2020-02-06 ENCOUNTER — Ambulatory Visit (INDEPENDENT_AMBULATORY_CARE_PROVIDER_SITE_OTHER): Payer: Medicare HMO | Admitting: Internal Medicine

## 2020-02-06 ENCOUNTER — Telehealth: Payer: Self-pay

## 2020-02-06 ENCOUNTER — Encounter: Payer: Self-pay | Admitting: Internal Medicine

## 2020-02-06 ENCOUNTER — Other Ambulatory Visit: Payer: Self-pay

## 2020-02-06 VITALS — BP 118/76 | HR 90 | Resp 18 | Ht 61.5 in | Wt 206.0 lb

## 2020-02-06 DIAGNOSIS — G8929 Other chronic pain: Secondary | ICD-10-CM

## 2020-02-06 DIAGNOSIS — Z09 Encounter for follow-up examination after completed treatment for conditions other than malignant neoplasm: Secondary | ICD-10-CM

## 2020-02-06 DIAGNOSIS — M545 Low back pain, unspecified: Secondary | ICD-10-CM | POA: Diagnosis not present

## 2020-02-06 DIAGNOSIS — T39395A Adverse effect of other nonsteroidal anti-inflammatory drugs [NSAID], initial encounter: Secondary | ICD-10-CM

## 2020-02-06 DIAGNOSIS — K269 Duodenal ulcer, unspecified as acute or chronic, without hemorrhage or perforation: Secondary | ICD-10-CM | POA: Diagnosis not present

## 2020-02-06 DIAGNOSIS — A048 Other specified bacterial intestinal infections: Secondary | ICD-10-CM | POA: Diagnosis not present

## 2020-02-06 DIAGNOSIS — K259 Gastric ulcer, unspecified as acute or chronic, without hemorrhage or perforation: Secondary | ICD-10-CM | POA: Diagnosis not present

## 2020-02-06 NOTE — Telephone Encounter (Signed)
No thank you, I already discussed this with him!

## 2020-02-06 NOTE — Progress Notes (Signed)
Established Patient Office Visit  Subjective:  Patient ID: Victoria Reyes, female    DOB: September 22, 1950  Age: 69 y.o. MRN: [id number]  CC:  Chief Complaint  Patient presents with  . GI Bleeding    ulcers     HPI Victoria Reyes is a 69 year old female with past medical history of hypertension, hypothyroidism and recent episode of upper GI bleeding presents for evaluation after discharge from the hospital.  Patient was admitted for melena all 10/18, had EGD-which showed 3 clean-based ulcers in the stomach, 5 cratered ulcers in the duodenal bulb, significant edema of the duodenal sweep, 2 ulcers with spontaneous oozing blood which were ablated with the bipolar probe.  H. pylori testing was positive.  Patient was started on triple therapy from the hospital.  Patient has been tolerating the medications well. Patient denies any episode of melena or hematochezia since being discharged from the hospital.  She denies any dizziness, lightheadedness, chest pain, dyspnea or palpitations.  Patient has been tolerating diet well.  Patient complains of chronic low back pain, for which she was using ibuprofen before the hospitalization.  She has stopped taking ibuprofen now.  Past Medical History:  Diagnosis Date  . Anemia    as a  young woman  . Diverticulitis 2005   CT   . DJD (degenerative joint disease)   . GERD (gastroesophageal reflux disease)   . Headache(784.0)   . Hiatal hernia   . Hyperlipidemia   . Hypertension   . Hypothyroidism   . Knee pain, right   . Low back pain   . Neuropathy    peroneal nerve  . Obesity   . Pneumonia    as child  . Pre-diabetes   . S/P colonoscopy 2005   Dr. Tamala Julian: sigmoid diverticulosis  . Thyroid disease   . Tobacco abuse     Past Surgical History:  Procedure Laterality Date  . BACK SURGERY    . BIOPSY  01/29/2020   Procedure: BIOPSY;  Surgeon: Harvel Quale, MD;  [facility]: [facility];  Service: Gastroenterology;;  . COLONOSCOPY   12/18/2010   Procedure: COLONOSCOPY;  Surgeon: Dorothyann Peng, MD;  Location: [facility];  Service: Endoscopy;  Laterality: N/A;  10:15am  . cyst removal right wrist    . ESOPHAGOGASTRODUODENOSCOPY (EGD) WITH PROPOFOL N/A 01/29/2020   Procedure: ESOPHAGOGASTRODUODENOSCOPY (EGD) WITH PROPOFOL;  Surgeon: Harvel Quale, MD;  Location: [facility];  Service: Gastroenterology;  Laterality: N/A;  . EYE SURGERY Bilateral    cataract surgery with lens implant  . TONSILLECTOMY    . TOTAL SHOULDER ARTHROPLASTY Left 12/21/2016   Procedure: TOTAL SHOULDER ARTHROPLASTY;  Surgeon: Meredith Pel, MD;  Location: [facility];  Service: Orthopedics;  Laterality: Left;  . TUBAL LIGATION    . umbillical hernia      Family History  Problem Relation Age of Onset  . Heart attack Sister        pacemaker  . Kidney failure Sister        on dialysis  . Colon cancer Neg Hx   . Stomach cancer Neg Hx   . Esophageal cancer Neg Hx     Social History   Socioeconomic History  . Marital status: Widowed    Spouse name: Not on file  . Number of children: Not on file  . Years of education: 44  . Highest education level: Some college, no degree  Occupational History  . Not on file  Tobacco Use  .  Smoking status: Former Smoker    Packs/day: 0.25    Years: 45.00    Pack years: 11.25    Types: Cigarettes    Start date: 09/30/1963    Quit date: 08/15/2015    Years since quitting: 4.4  . Smokeless tobacco: Never Used  Vaping Use  . Vaping Use: Never used  Substance and Sexual Activity  . Alcohol use: No    Alcohol/week: 0.0 standard drinks  . Drug use: No  . Sexual activity: Not Currently    Birth control/protection: Post-menopausal  Other Topics Concern  . Not on file  Social History Narrative  . Not on file   Social Determinants of Health   Financial Resource Strain: Low Risk   . Difficulty of Paying Living Expenses: Not hard at all  Food Insecurity: No Food Insecurity  . Worried  About Charity fundraiser in the Last Year: Never true  . Ran Out of Food in the Last Year: Never true  Transportation Needs: No Transportation Needs  . Lack of Transportation (Medical): No  . Lack of Transportation (Non-Medical): No  Physical Activity: Insufficiently Active  . Days of Exercise per Week: 3 days  . Minutes of Exercise per Session: 30 min  Stress: No Stress Concern Present  . Feeling of Stress : Not at all  Social Connections: Socially Isolated  . Frequency of Communication with Friends and Family: More than three times a week  . Frequency of Social Gatherings with Friends and Family: More than three times a week  . Attends Religious Services: Never  . Active Member of Clubs or Organizations: No  . Attends Archivist Meetings: Never  . Marital Status: Widowed  Intimate Partner Violence: Not At Risk  . Fear of Current or Ex-Partner: No  . Emotionally Abused: No  . Physically Abused: No  . Sexually Abused: No    Outpatient Medications Prior to Visit  Medication Sig Dispense Refill  . amLODipine (NORVASC) 5 MG tablet TAKE 1 TABLET BY MOUTH EVERY DAY (Patient taking differently: Take 5 mg by mouth daily. ) 90 tablet 3  . amoxicillin (AMOXIL) 500 MG tablet Take 2 tablets (1,000 mg total) by mouth 2 (two) times daily. 56 tablet 0  . clarithromycin (BIAXIN) 500 MG tablet Take 1 tablet (500 mg total) by mouth 2 (two) times daily. 28 tablet 0  . levothyroxine (SYNTHROID) 100 MCG tablet Take 1 tablet (100 mcg total) by mouth daily. 90 tablet 3  . Multiple Vitamin (MULTIVITAMIN PO) Take 1 tablet by mouth daily.    Marland Kitchen omeprazole (PRILOSEC) 40 MG capsule Take 1 capsule (40 mg total) by mouth 2 (two) times daily. 28 capsule 0  . omeprazole (PRILOSEC) 40 MG capsule Take 1 capsule (40 mg total) by mouth daily. Restart AFTER finished with new prescription for ulcers 90 capsule 1   No facility-administered medications prior to visit.    Allergies  Allergen Reactions  .  Hydrocodone Nausea Only    REACTION: Nausea  . Sulfa Antibiotics Itching    ROS Review of Systems  Constitutional: Negative for chills and fever.  HENT: Negative for congestion, sinus pressure, sinus pain and sore throat.   Eyes: Negative for pain and discharge.  Respiratory: Negative for cough and shortness of breath.   Cardiovascular: Negative for chest pain and palpitations.  Gastrointestinal: Negative for abdominal pain, constipation, diarrhea, nausea and vomiting.  Endocrine: Negative for polydipsia and polyuria.  Genitourinary: Negative for dysuria and hematuria.  Musculoskeletal: Positive for back  pain. Negative for neck pain and neck stiffness.  Skin: Negative for rash.  Neurological: Negative for dizziness and weakness.  Psychiatric/Behavioral: Negative for agitation and behavioral problems.      Objective:    Physical Exam Vitals reviewed.  Constitutional:      General: She is not in acute distress.    Appearance: She is obese. She is not diaphoretic.  HENT:     Head: Normocephalic and atraumatic.     Nose: Nose normal. No congestion.     Mouth/Throat:     Mouth: Mucous membranes are moist.     Pharynx: No posterior oropharyngeal erythema.  Eyes:     General: No scleral icterus.    Extraocular Movements: Extraocular movements intact.     Pupils: Pupils are equal, round, and reactive to light.  Cardiovascular:     Rate and Rhythm: Normal rate and regular rhythm.     Pulses: Normal pulses.     Heart sounds: Normal heart sounds. No murmur heard.  No friction rub. No gallop.   Pulmonary:     Breath sounds: Normal breath sounds. No wheezing or rales.  Abdominal:     Palpations: Abdomen is soft.     Tenderness: There is no abdominal tenderness.  Musculoskeletal:     Cervical back: Neck supple. No tenderness.     Right lower leg: No edema.     Left lower leg: No edema.  Skin:    General: Skin is warm.     Findings: No rash.  Neurological:     General: No  focal deficit present.     Mental Status: She is alert and oriented to person, place, and time.     Sensory: No sensory deficit.     Motor: No weakness.  Psychiatric:        Mood and Affect: Mood normal.        Behavior: Behavior normal.     BP 118/76   Pulse 90   Resp 18   Ht 5' 1.5" (1.562 m)   Wt 206 lb (93.4 kg)   SpO2 97%   BMI 38.29 kg/m  Wt Readings from Last 3 Encounters:  02/06/20 206 lb (93.4 kg)  01/31/20 204 lb 14.4 oz (92.9 kg)  01/17/20 210 lb (95.3 kg)     Health Maintenance Due  Topic Date Due  . TETANUS/TDAP  Never done  . PNA vac Low Risk Adult (1 of 2 - PCV13) Never done  . INFLUENZA VACCINE  Never done    There are no preventive care reminders to display for this patient.  Lab Results  Component Value Date   TSH 0.221 (L) 01/29/2020   Lab Results  Component Value Date   WBC 8.0 01/31/2020   HGB 9.9 (L) 01/31/2020   HCT 31.1 (L) 01/31/2020   MCV 89.6 01/31/2020   PLT 261 01/31/2020   Lab Results  Component Value Date   NA 142 01/31/2020   K 4.0 01/31/2020   CO2 27 01/31/2020   GLUCOSE 92 01/31/2020   BUN 9 01/31/2020   CREATININE 0.66 01/31/2020   BILITOT 0.7 01/29/2020   ALKPHOS 91 01/29/2020   AST 15 01/29/2020   ALT 11 01/29/2020   PROT 6.6 01/29/2020   ALBUMIN 3.5 01/29/2020   CALCIUM 8.5 (L) 01/31/2020   ANIONGAP 10 01/31/2020   Lab Results  Component Value Date   CHOL 229 (H) 09/12/2019   Lab Results  Component Value Date   HDL 60 09/12/2019   Lab  Results  Component Value Date   LDLCALC 142 (H) 09/12/2019   Lab Results  Component Value Date   TRIG 144 09/12/2019   Lab Results  Component Value Date   CHOLHDL 3.8 09/12/2019   Lab Results  Component Value Date   HGBA1C 5.6 09/22/2017      Assessment & Plan:   Problem List Items Addressed This Visit      Hospital discharge follow-up    -  Primary Chart reviewed from the hospitalization including the discharge summary Medication reconciliation done and  reviewed with the patient Physical exam as above   H. pylori infection Continue triple therapy Continue Omeprazole for now, will hold it before repeat testing for H Pylori    Digestive   NSAID-induced gastric ulcer NSAID-induced duodenal ulcer Avoid NSAID use Omeprazole Continue to follow-up with gastroenterologist Check CBC     Relevant Orders   CBC        Other   Chronic back pain Advised simple back exercises, material provided Tylenol as needed Back brace, heating pads and/or local pain relieving cream as needed advised    No orders of the defined types were placed in this encounter.   Follow-up: Return if symptoms worsen or fail to improve.    Lindell Spar, MD

## 2020-02-06 NOTE — Patient Instructions (Addendum)
Please continue to take antibiotics for H Pylori infection as prescribed.  Please avoid spicy food, and cut down oily and fried food. Please perform back exercises as below.  Back Exercises These exercises help to make your trunk and back strong. They also help to keep the lower back flexible. Doing these exercises can help to prevent back pain or lessen existing pain.  If you have back pain, try to do these exercises 2-3 times each day or as told by your doctor.  As you get better, do the exercises once each day. Repeat the exercises more often as told by your doctor.  To stop back pain from coming back, do the exercises once each day, or as told by your doctor. Exercises Single knee to chest Do these steps 3-5 times in a row for each leg: 1. Lie on your back on a firm bed or the floor with your legs stretched out. 2. Bring one knee to your chest. 3. Grab your knee or thigh with both hands and hold them it in place. 4. Pull on your knee until you feel a gentle stretch in your lower back or buttocks. 5. Keep doing the stretch for 10-30 seconds. 6. Slowly let go of your leg and straighten it. Pelvic tilt Do these steps 5-10 times in a row: 1. Lie on your back on a firm bed or the floor with your legs stretched out. 2. Bend your knees so they point up to the ceiling. Your feet should be flat on the floor. 3. Tighten your lower belly (abdomen) muscles to press your lower back against the floor. This will make your tailbone point up to the ceiling instead of pointing down to your feet or the floor. 4. Stay in this position for 5-10 seconds while you gently tighten your muscles and breathe evenly. Cat-cow Do these steps until your lower back bends more easily: 1. Get on your hands and knees on a firm surface. Keep your hands under your shoulders, and keep your knees under your hips. You may put padding under your knees. 2. Let your head hang down toward your chest. Tighten (contract) the  muscles in your belly. Point your tailbone toward the floor so your lower back becomes rounded like the back of a cat. 3. Stay in this position for 5 seconds. 4. Slowly lift your head. Let the muscles of your belly relax. Point your tailbone up toward the ceiling so your back forms a sagging arch like the back of a cow. 5. Stay in this position for 5 seconds.  Press-ups Do these steps 5-10 times in a row: 1. Lie on your belly (face-down) on the floor. 2. Place your hands near your head, about shoulder-width apart. 3. While you keep your back relaxed and keep your hips on the floor, slowly straighten your arms to raise the top half of your body and lift your shoulders. Do not use your back muscles. You may change where you place your hands in order to make yourself more comfortable. 4. Stay in this position for 5 seconds. 5. Slowly return to lying flat on the floor.  Bridges Do these steps 10 times in a row: 1. Lie on your back on a firm surface. 2. Bend your knees so they point up to the ceiling. Your feet should be flat on the floor. Your arms should be flat at your sides, next to your body. 3. Tighten your butt muscles and lift your butt off the floor until your  waist is almost as high as your knees. If you do not feel the muscles working in your butt and the back of your thighs, slide your feet 1-2 inches farther away from your butt. 4. Stay in this position for 3-5 seconds. 5. Slowly lower your butt to the floor, and let your butt muscles relax. If this exercise is too easy, try doing it with your arms crossed over your chest. Belly crunches Do these steps 5-10 times in a row: 1. Lie on your back on a firm bed or the floor with your legs stretched out. 2. Bend your knees so they point up to the ceiling. Your feet should be flat on the floor. 3. Cross your arms over your chest. 4. Tip your chin a little bit toward your chest but do not bend your neck. 5. Tighten your belly muscles and  slowly raise your chest just enough to lift your shoulder blades a tiny bit off of the floor. Avoid raising your body higher than that, because it can put too much stress on your low back. 6. Slowly lower your chest and your head to the floor. Back lifts Do these steps 5-10 times in a row: 1. Lie on your belly (face-down) with your arms at your sides, and rest your forehead on the floor. 2. Tighten the muscles in your legs and your butt. 3. Slowly lift your chest off of the floor while you keep your hips on the floor. Keep the back of your head in line with the curve in your back. Look at the floor while you do this. 4. Stay in this position for 3-5 seconds. 5. Slowly lower your chest and your face to the floor. Contact a doctor if:  Your back pain gets a lot worse when you do an exercise.  Your back pain does not get better 2 hours after you exercise. If you have any of these problems, stop doing the exercises. Do not do them again unless your doctor says it is okay. Get help right away if:  You have sudden, very bad back pain. If this happens, stop doing the exercises. Do not do them again unless your doctor says it is okay. This information is not intended to replace advice given to you by your health care provider. Make sure you discuss any questions you have with your health care provider. Document Revised: 12/22/2017 Document Reviewed: 12/22/2017 Elsevier Patient Education  2020 [employer].

## 2020-02-07 DIAGNOSIS — E785 Hyperlipidemia, unspecified: Secondary | ICD-10-CM | POA: Diagnosis not present

## 2020-02-07 DIAGNOSIS — I10 Essential (primary) hypertension: Secondary | ICD-10-CM | POA: Diagnosis not present

## 2020-02-08 ENCOUNTER — Other Ambulatory Visit: Payer: Self-pay | Admitting: Family Medicine

## 2020-02-08 LAB — COMPLETE METABOLIC PANEL WITH GFR
AG Ratio: 1.6 (calc) (ref 1.0–2.5)
ALT: 8 U/L (ref 6–29)
AST: 13 U/L (ref 10–35)
Albumin: 3.9 g/dL (ref 3.6–5.1)
Alkaline phosphatase (APISO): 107 U/L (ref 37–153)
BUN: 9 mg/dL (ref 7–25)
CO2: 23 mmol/L (ref 20–32)
Calcium: 9 mg/dL (ref 8.6–10.4)
Chloride: 107 mmol/L (ref 98–110)
Creat: 0.78 mg/dL (ref 0.60–0.93)
GFR, Est African American: 89 mL/min/{1.73_m2} (ref >=60)
GFR, Est Non African American: 77 mL/min/{1.73_m2} (ref >=60)
Globulin: 2.5 g/dL (calc) (ref 1.9–3.7)
Glucose, Bld: 89 mg/dL (ref 65–99)
Potassium: 3.9 mmol/L (ref 3.5–5.3)
Sodium: 141 mmol/L (ref 135–146)
Total Bilirubin: 0.4 mg/dL (ref 0.2–1.2)
Total Protein: 6.4 g/dL (ref 6.1–8.1)

## 2020-02-08 LAB — CBC
HCT: 33.4 % — ABNORMAL LOW (ref 35.0–45.0)
Hemoglobin: 11 g/dL — ABNORMAL LOW (ref 11.7–15.5)
MCH: 28.2 pg (ref 27.0–33.0)
MCHC: 32.9 g/dL (ref 32.0–36.0)
MCV: 85.6 fL (ref 80.0–100.0)
MPV: 11.8 fL (ref 7.5–12.5)
Platelets: 336 10*3/uL (ref 140–400)
RBC: 3.9 10*6/uL (ref 3.80–5.10)
RDW: 13.8 % (ref 11.0–15.0)
WBC: 8.6 10*3/uL (ref 3.8–10.8)

## 2020-02-08 LAB — LIPID PANEL
Cholesterol: 188 mg/dL (ref <200)
HDL: 63 mg/dL (ref >=50)
LDL Cholesterol (Calc): 104 mg/dL (calc) — ABNORMAL HIGH
Non-HDL Cholesterol (Calc): 125 mg/dL (calc) (ref <130)
Total CHOL/HDL Ratio: 3 (calc) (ref <5.0)
Triglycerides: 114 mg/dL (ref <150)

## 2020-02-08 LAB — TSH: TSH: 0.26 mIU/L — ABNORMAL LOW (ref 0.40–4.50)

## 2020-02-15 ENCOUNTER — Ambulatory Visit (HOSPITAL_COMMUNITY): Payer: Medicare HMO

## 2020-02-18 ENCOUNTER — Other Ambulatory Visit: Payer: Self-pay

## 2020-02-18 ENCOUNTER — Ambulatory Visit (HOSPITAL_COMMUNITY)
Admission: RE | Admit: 2020-02-18 | Discharge: 2020-02-18 | Disposition: A | Discharge status: Home / Self Care | Payer: Medicare HMO | Source: Ambulatory Visit | Attending: Family Medicine | Admitting: Family Medicine

## 2020-02-18 DIAGNOSIS — Z1231 Encounter for screening mammogram for malignant neoplasm of breast: Secondary | ICD-10-CM

## 2020-03-10 ENCOUNTER — Ambulatory Visit: Payer: Medicare HMO | Admitting: Family Medicine

## 2020-03-11 ENCOUNTER — Encounter: Payer: Self-pay | Admitting: Family Medicine

## 2020-03-11 ENCOUNTER — Ambulatory Visit (INDEPENDENT_AMBULATORY_CARE_PROVIDER_SITE_OTHER): Payer: Medicare HMO | Admitting: Family Medicine

## 2020-03-11 ENCOUNTER — Other Ambulatory Visit: Payer: Self-pay

## 2020-03-11 VITALS — BP 116/73 | HR 61 | Resp 16 | Ht 61.5 in | Wt 206.0 lb

## 2020-03-11 DIAGNOSIS — I10 Essential (primary) hypertension: Secondary | ICD-10-CM

## 2020-03-11 DIAGNOSIS — E038 Other specified hypothyroidism: Secondary | ICD-10-CM

## 2020-03-11 DIAGNOSIS — Z2821 Immunization not carried out because of patient refusal: Secondary | ICD-10-CM

## 2020-03-11 DIAGNOSIS — Z0000 Encounter for general adult medical examination without abnormal findings: Secondary | ICD-10-CM

## 2020-03-11 DIAGNOSIS — E559 Vitamin D deficiency, unspecified: Secondary | ICD-10-CM

## 2020-03-11 NOTE — Assessment & Plan Note (Signed)

## 2020-03-11 NOTE — Progress Notes (Signed)
    Victoria Reyes     MRN: [id number]      DOB: 1951/01/04  HPI: Patient is in for annual physical exam. No other health concerns are expressed or addressed at the visit. Recent labs,  are reviewed. Immunization is reviewed , and  She is still refusing needed vaccines   PE: BP 116/73   Pulse 61   Resp 16   Ht 5' 1.5" (1.562 m)   Wt 206 lb 0.6 oz (93.5 kg)   SpO2 99%   BMI 38.30 kg/m   Pleasant  female, alert and oriented x 3, in no cardio-pulmonary distress. Afebrile. HEENT No facial trauma or asymetry. Sinuses non tender.  Extra occullar muscles intact.. External ears normal, . Neck: supple, no adenopathy,JVD or thyromegaly.No bruits.  Chest: Clear to ascultation bilaterally.No crackles or wheezes. Non tender to palpation  Breast: Normal mammogram in 02/2020, not examined  Cardiovascular system; Heart sounds normal,  S1 and  S2 ,no S3.  No murmur, or thrill. Apical beat not displaced Peripheral pulses normal.  Abdomen: Soft, non tender, no organomegaly or masses. No bruits. Bowel sounds normal. No guarding, tenderness or rebound.      Musculoskeletal exam: Adequate  ROM of spine, hips , shoulders and knees. No deformity ,swelling or crepitus noted. No muscle wasting or atrophy.   Neurologic: Cranial nerves 2 to 12 intact. Power, tone ,sensation and reflexes normal throughout. No disturbance in gait. No tremor.  Skin: Intact, no ulceration, erythema , scaling or rash noted. Pigmentation normal throughout  Psych; Normal mood and affect. Judgement and concentration normal   Assessment & Plan:  Encounter for annual physical exam Annual exam as documented. Counseling done  re healthy lifestyle involving commitment to 150 minutes exercise per week, heart healthy diet, and attaining healthy weight.The importance of adequate sleep also discussed. Regular seat belt use and home safety, is also discussed. Changes in health habits are decided on by the  patient with goals and time frames  set for achieving them. Immunization and cancer screening needs are specifically addressed at this visit.   Immunization refused Offered and refues flu and pneumonia vacines, re educated also re importance/ need for both

## 2020-03-11 NOTE — Patient Instructions (Addendum)
F/U in office with MD in 5 months, call if you need me before   Please call us when/ if you decide on flu and  pneumonia vaccines, ask [facility] baby for help, she will help you because of how much she loves you   Please get non fasting cBC, TSH and vit D 5 days before next visit   Think about what you will eat, plan ahead. Choose " clean, green, fresh or frozen" over canned, processed or packaged foods which are more sugary, salty and fatty. 70 to 75% of food eaten should be vegetables and fruit. Three meals at set times with snacks allowed between meals, but they must be fruit or vegetables. Aim to eat over a 12 hour period , example 7 am to 7 pm, and STOP after  your last meal of the day. Drink water,generally about 64 ounces per day, no other drink is as healthy. Fruit juice is best enjoyed in a healthy way, by EATING the fruit. It is important that you exercise regularly at least 30 minutes 5 times a week. If you develop chest pain, have severe difficulty breathing, or feel very tired, stop exercising immediately and seek medical attention   Thanks for choosing [facility], we consider it a privelige to serve you.

## 2020-03-13 ENCOUNTER — Other Ambulatory Visit: Payer: Self-pay

## 2020-03-13 ENCOUNTER — Encounter: Payer: Self-pay | Admitting: Orthopedic Surgery

## 2020-03-13 ENCOUNTER — Ambulatory Visit: Payer: Medicare HMO | Admitting: Orthopedic Surgery

## 2020-03-13 VITALS — BP 138/69 | HR 76 | Ht 61.5 in | Wt 202.0 lb

## 2020-03-13 DIAGNOSIS — M23321 Other meniscus derangements, posterior horn of medial meniscus, right knee: Secondary | ICD-10-CM

## 2020-03-13 DIAGNOSIS — M25561 Pain in right knee: Secondary | ICD-10-CM

## 2020-03-13 DIAGNOSIS — H524 Presbyopia: Secondary | ICD-10-CM | POA: Diagnosis not present

## 2020-03-13 DIAGNOSIS — G8929 Other chronic pain: Secondary | ICD-10-CM | POA: Diagnosis not present

## 2020-03-13 NOTE — Progress Notes (Signed)
Chief Complaint  Patient presents with  . Knee Pain    right    History is a 69 year old female fell on her knee back in 2020 around August thought to have contusion was treated with normal measures of pain control active range of motion general exercises has not improved presents with continued medial knee pain swelling and loss of motion especially flexion  Review of systems no chest pain or shortness of breath   Past Medical History:  Diagnosis Date  . Anemia    as a  young woman  . Diverticulitis 2005   CT   . DJD (degenerative joint disease)   . GERD (gastroesophageal reflux disease)   . Headache(784.0)   . Hiatal hernia   . Hyperlipidemia   . Hypertension   . Hypothyroidism   . Knee pain, right   . Low back pain   . Neuropathy    peroneal nerve  . Obesity   . Pneumonia    as child  . Pre-diabetes   . S/P colonoscopy 2005   Dr. Tamala Julian: sigmoid diverticulosis  . Thyroid disease   . Tobacco abuse    Past Surgical History:  Procedure Laterality Date  . BACK SURGERY    . BIOPSY  01/29/2020   Procedure: BIOPSY;  Surgeon: Harvel Quale, MD;  [facility];  Service: Gastroenterology;;  . COLONOSCOPY  12/18/2010   Procedure: COLONOSCOPY;  Surgeon: Dorothyann Peng, MD;  [facility]: [facility];  Service: Endoscopy;  Laterality: N/A;  10:15am  . cyst removal right wrist    . ESOPHAGOGASTRODUODENOSCOPY (EGD) WITH PROPOFOL N/A 01/29/2020   Procedure: ESOPHAGOGASTRODUODENOSCOPY (EGD) WITH PROPOFOL;  Surgeon: Harvel Quale, MD;  Location: [facility];  Service: Gastroenterology;  Laterality: N/A;  . EYE SURGERY Bilateral    cataract surgery with lens implant  . TONSILLECTOMY    . TOTAL SHOULDER ARTHROPLASTY Left 12/21/2016   Procedure: TOTAL SHOULDER ARTHROPLASTY;  Surgeon: Meredith Pel, MD;  Location: [place];  Service: Orthopedics;  Laterality: Left;  . TUBAL LIGATION    . umbillical hernia     BP 374/82   Pulse 76   Ht 5' 1.5"  (1.562 m)   Wt 202 lb (91.6 kg)   BMI 37.55 kg/m   Physical Exam Constitutional:      General: She is not in acute distress.    Appearance: She is well-developed.  Cardiovascular:     Comments: No peripheral edema Musculoskeletal:     Comments: Right knee Skin normal without rash or erythema Medial joint line is tender McMurray's sign is positive Joint effusion is noted Loss of extension normal flexion with pain at terminal flexion Ligaments feel stable Muscle tone and strength normal with intact extensor mechanism  Skin:    General: Skin is warm and dry.  Neurological:     Mental Status: She is alert and oriented to person, place, and time.     Sensory: No sensory deficit.     Coordination: Coordination normal.     Gait: Gait abnormal.     Deep Tendon Reflexes: Reflexes are normal and symmetric.  Psychiatric:        Mood and Affect: Mood normal.        Behavior: Behavior normal.        Thought Content: Thought content normal.    X-ray showed mild varus deformity osteoarthritis mild to moderate  Encounter Diagnoses  Name Primary?  . Derangement of posterior horn of medial meniscus of right knee  Yes  . Chronic pain of right knee     Assessment and plan 69 year old female continued medial pain after falling on her right knee over a year ago has not improved with conservative measures recommend MRI to diagnose or rule out medial meniscal tear and determine if surgery is needed  (Chronic exacerbation low risk of morbidity currently) if surgery needed morbidity would be moderate risk

## 2020-03-13 NOTE — Patient Instructions (Signed)
MRI RT KNEE

## 2020-03-16 NOTE — Assessment & Plan Note (Signed)
Offered and refues flu and pneumonia vacines, re educated also re importance/ need for both

## 2020-03-25 ENCOUNTER — Ambulatory Visit (HOSPITAL_COMMUNITY): Payer: Medicare HMO

## 2020-03-27 ENCOUNTER — Ambulatory Visit (HOSPITAL_COMMUNITY): Payer: Medicare HMO

## 2020-04-01 ENCOUNTER — Other Ambulatory Visit: Payer: Self-pay | Admitting: Family Medicine

## 2020-04-02 ENCOUNTER — Other Ambulatory Visit: Payer: Self-pay | Admitting: Family Medicine

## 2020-04-08 ENCOUNTER — Ambulatory Visit (HOSPITAL_COMMUNITY)
Admission: RE | Admit: 2020-04-08 | Discharge: 2020-04-08 | Disposition: A | Discharge status: Home / Self Care | Payer: Medicare HMO | Source: Ambulatory Visit | Attending: Orthopedic Surgery | Admitting: Orthopedic Surgery

## 2020-04-08 ENCOUNTER — Other Ambulatory Visit: Payer: Self-pay

## 2020-04-08 ENCOUNTER — Encounter: Payer: Self-pay | Admitting: Gastroenterology

## 2020-04-08 DIAGNOSIS — G8929 Other chronic pain: Secondary | ICD-10-CM

## 2020-04-08 DIAGNOSIS — M23321 Other meniscus derangements, posterior horn of medial meniscus, right knee: Secondary | ICD-10-CM | POA: Diagnosis not present

## 2020-04-08 DIAGNOSIS — M7989 Other specified soft tissue disorders: Secondary | ICD-10-CM | POA: Diagnosis not present

## 2020-04-08 DIAGNOSIS — M25561 Pain in right knee: Secondary | ICD-10-CM | POA: Diagnosis not present

## 2020-04-08 DIAGNOSIS — S83231A Complex tear of medial meniscus, current injury, right knee, initial encounter: Secondary | ICD-10-CM | POA: Diagnosis not present

## 2020-04-08 DIAGNOSIS — M1711 Unilateral primary osteoarthritis, right knee: Secondary | ICD-10-CM | POA: Diagnosis not present

## 2020-04-08 NOTE — Progress Notes (Signed)
Referring Provider: Kerri Perches, MD Primary Care Physician:  Kerri Perches, MD Primary GI Physician: Dr. Marletta Lor  Chief Complaint  Patient presents with  . Hospitalization Follow-up    Stools are black since starting Iron    HPI:   Victoria Reyes is a 69 y.o. female presenting today for hospital follow-up. History of prediabetes, obesity, neuropathy, hypothyroidism, HTN, HLD, diverticulosis, adenomatous colon polyps with last colonoscopy in 2012 wth 3 mm tubular adenoma and reocmmended repeat in 2022, GERD, hiatal hernia.   Patient was admitted to [facility] 01/28/2020-01/31/2020 with acute GI bleed. Patient reported acute onset melena and cranberry red color on toilet tissue that began 10/18.  Hemoglobin 12.5 on admission.  Stool heme positive.  Admitted to using ibuprofen and naproxen fairly routinely for right hip and back pain.  Not on PPI routinely.  EGD with 3 gastric ulcers with clean base, 5 ulcers in the duodenal bulb and first portion of duodenum, 2 were noted to be oozing, treated with bipolar cautery.  Gastric biopsies consistent with H. pylori gastritis.  Recommended treating H. pylori at time of discharge with omeprazole, amoxicillin, and Biaxin, continue PPI twice daily, repeat EGD in 10-12 weeks and consider surveillance colonoscopy at that time.  Notably, hemoglobin stabilized around 9.9 by time of discharge.  Hemoglobin 11 on 02/07/2020  Today: She has been doing well since her hospital discharge.  She had follow-up with PCP on 11/30.  She was started on iron at that time and noticed that her stools have turned black since then.  Melena had completely resolved prior to this.  No BRBPR.  She is asking if she needs to continue iron.  She completed prescription for H. pylori and resume taking omeprazole once daily thereafter rather than continuing twice daily.  No abdominal pain, GERD symptoms, dysphagia, nausea, vomiting.  BMs daily without  constipation or diarrhea.  No unintentional weight loss.  No NSAIDs.   Past Medical History:  Diagnosis Date  . Anemia    as a  young woman  . Diverticulitis 2005   CT   . DJD (degenerative joint disease)   . GERD (gastroesophageal reflux disease)   . H. pylori infection 01/2020   S/p treatment with omeprazole, amoxicillin, and Biaxin.  Marland Kitchen Headache(784.0)   . Hiatal hernia   . Hyperlipidemia   . Hypertension   . Hypothyroidism   . Knee pain, right   . Low back pain   . Neuropathy    peroneal nerve  . Obesity   . Pneumonia    as child  . Pre-diabetes   . S/P colonoscopy 2005   Dr. Katrinka Blazing: sigmoid diverticulosis  . Thyroid disease   . Tobacco abuse     Past Surgical History:  Procedure Laterality Date  . BACK SURGERY    . BIOPSY  01/29/2020   Procedure: BIOPSY;  Surgeon: Marguerita Merles, Reuel Boom, MD;  Location: [facility];  Service: Gastroenterology;;  . COLONOSCOPY  12/18/2010   Procedure: COLONOSCOPY;  Surgeon: Arlyce Harman, MD; 3 mm sessile polyp at hepatic flexure, pancolonic diverticulosis, internal hemorrhoids, anal lesion just above the dentate line biopsied via cold forceps.  Colonic polyp was a tubular adenoma.  Anus biopsy with mild active inflammation associated with reactive epithelial changes and hemorrhage.  Recommended repeat colonoscopy in 2022.  . cyst removal right wrist    . ESOPHAGOGASTRODUODENOSCOPY (EGD) WITH PROPOFOL N/A 01/29/2020   Procedure: ESOPHAGOGASTRODUODENOSCOPY (EGD) WITH PROPOFOL;  Surgeon: Dolores Frame, MD;  3 gastric ulcers with clean base, 5 ulcers in the duodenal bulb and first portion of duodenum, 2 were noted to be oozing, treated with bipolar cautery.  Gastric biopsies consistent with H. pylori gastritis.  Marland Kitchen EYE SURGERY Bilateral    cataract surgery with lens implant  . TONSILLECTOMY    . TOTAL SHOULDER ARTHROPLASTY Left 12/21/2016   Procedure: TOTAL SHOULDER ARTHROPLASTY;  Surgeon: Cammy Copa, MD;   Location: [facility];  Service: Orthopedics;  Laterality: Left;  . TUBAL LIGATION    . umbillical hernia      Current Outpatient Medications  Medication Sig Dispense Refill  . amLODipine (NORVASC) 5 MG tablet TAKE 1 TABLET BY MOUTH EVERY DAY (Patient taking differently: Take 5 mg by mouth daily.) 90 tablet 3  . Cholecalciferol (VITAMIN D) 50 MCG (2000 UT) CAPS Take by mouth daily at 12 noon.    . Ferrous Sulfate ([employer] IRON PO) Take 1 tablet by mouth daily.    . furosemide (LASIX) 20 MG tablet TAKE 1 TABLET BY MOUTH DAILY AS NEEDED, FOR LEG SWELLING 90 tablet 1  . levothyroxine (SYNTHROID) 100 MCG tablet Take one tablet by mouth every Monday, Wednesday, Friday and Sunday. Take half tablet every Tuesday, Thursday and Saturday 90 tablet 3  . omeprazole (PRILOSEC) 40 MG capsule Take 1 capsule (40 mg total) by mouth 2 (two) times daily before a meal. 60 capsule 2   No current facility-administered medications for this visit.    Allergies as of 04/09/2020 - Review Complete 04/09/2020  Allergen Reaction Noted  . Nsaids Other (See Comments) 03/16/2020  . Hydrocodone Nausea Only 06/14/2006  . Sulfa antibiotics Itching 08/03/2011    Family History  Problem Relation Age of Onset  . Heart attack Sister        pacemaker  . Kidney failure Sister        on dialysis  . Colon cancer Neg Hx   . Stomach cancer Neg Hx   . Esophageal cancer Neg Hx     Social History   Socioeconomic History  . Marital status: Widowed    Spouse name: Not on file  . Number of children: Not on file  . Years of education: 56  . Highest education level: Some college, no degree  Occupational History  . Not on file  Tobacco Use  . Smoking status: Former Smoker    Packs/day: 0.25    Years: 45.00    Pack years: 11.25    Types: Cigarettes    Start date: 09/30/1963    Quit date: 08/15/2015    Years since quitting: 4.6  . Smokeless tobacco: Never Used  Vaping Use  . Vaping Use: Never used  Substance and Sexual Activity   . Alcohol use: No    Alcohol/week: 0.0 standard drinks  . Drug use: No  . Sexual activity: Not Currently    Birth control/protection: Post-menopausal  Other Topics Concern  . Not on file  Social History Narrative  . Not on file   Social Determinants of Health   Financial Resource Strain: Low Risk   . Difficulty of Paying Living Expenses: Not hard at all  Food Insecurity: No Food Insecurity  . Worried About Programme researcher, broadcasting/film/video in the Last Year: Never true  . Ran Out of Food in the Last Year: Never true  Transportation Needs: No Transportation Needs  . Lack of Transportation (Medical): No  . Lack of Transportation (Non-Medical): No  Physical Activity: Insufficiently Active  . Days of Exercise  per Week: 3 days  . Minutes of Exercise per Session: 30 min  Stress: No Stress Concern Present  . Feeling of Stress : Not at all  Social Connections: Socially Isolated  . Frequency of Communication with Friends and Family: More than three times a week  . Frequency of Social Gatherings with Friends and Family: More than three times a week  . Attends Religious Services: Never  . Active Member of Clubs or Organizations: No  . Attends Archivist Meetings: Never  . Marital Status: Widowed    Review of Systems: Gen: Denies fever, chills, cold or flulike symptoms, lightheadedness, dizziness, presyncope, syncope. CV: Denies chest pain or palpitations. Resp: Denies dyspnea or cough. GI: See HPI Heme: See HPI  Physical Exam: BP 123/67   Pulse 76   Temp (!) 96.9 F (36.1 C) (Temporal)   Ht 5\' 2"  (1.575 m)   Wt 210 lb 12.8 oz (95.6 kg)   BMI 38.56 kg/m  General:   Alert and oriented. No distress noted. Pleasant and cooperative.  Head:  Normocephalic and atraumatic. Eyes:  Conjuctiva clear without scleral icterus. Heart:  S1, S2 present without murmurs appreciated. Lungs:  Clear to auscultation bilaterally. No wheezes, rales, or rhonchi. No distress.  Abdomen:  +BS, soft,  non-tender and non-distended. No rebound or guarding. No HSM or masses noted. Msk:  Symmetrical without gross deformities. Normal posture. Extremities:  Without edema. Neurologic:  Alert and  oriented x4 Psych:  Normal mood and affect.  Assessment:  69 year old female presenting today for hospital follow-up.  Patient was admitted to [facility] 01/28/2020-01/31/2020 with acute GI bleed.  She presented with melena and cranberry red color on toilet tissue that began the day of admission.  Admitted to taking ibuprofen and naproxen fairly routinely.  Underwent EGD and was found to have 3 gastric ulcers with clean base, 5 ulcers in the duodenal bulb and first portion of the duodenum, 2 were noted to be oozing, treated with bipolar cautery.  Gastric biopsies consistent with H. pylori gastritis.  Notably, hemoglobin declined from 12.5 on admission to 9.9 at time of discharge.  Gastric and duodenal ulcers: Multifactorial in the setting of NSAIDs and H. pylori.  She has completed H. pylori treatment with omeprazole, amoxicillin, and Biaxin.  After treatment, she reduced omeprazole to once daily rather than continuing twice daily.  She has discontinued all NSAIDs.  Melena had completely resolved by the time of discharge.  Stools are now dark as PCP started her on iron on 11/30.  She needs 31-month surveillance EGD with biopsies to confirm H. pylori eradication/gastric ulcer healing.  We will increase omeprazole to BID until EGD is completed.  H. pylori infection: S/p treatment with omeprazole twice daily, amoxicillin, and Biaxin x14 days.  Plans for 51-month surveillance EGD in the coming months due to gastric ulcers as per above.  She will need biopsies at that time to confirm H. pylori eradication.  Anemia: In the setting of acute upper GI bleed.  Most recent hemoglobin 11.0 on 02/07/2020, up from 9.9 at the time of hospital discharge on 10/21.  No iron panel on file.  Patient reports PCP started her on  oral iron on 11/30.  We will plan to update CBC and iron panel.  If within normal limits, will discontinue oral iron.  History of adenomatous polyp of colon: History of 3 mm tubular adenoma on her last colonoscopy in September 2012 with recommendations to repeat colonoscopy in 2022.  We will plan  to arrange colonoscopy at the time of upcoming surveillance EGD.  Although this is a few months prior to 10-year surveillance mark, this still follows current guidelines with recommendations to repeat colonoscopy 7-10 years following removal of small tubular adenomas.  Plan:  1.  CBC and iron panel.  2.  Increase omeprazole to 40 mg twice daily until time of EGD to verify gastric ulcer healing. 3.  Proceed with EGD with gastric biopsies + colonoscopy with propofol with Dr. Abbey Chatters in the near future. The risks, benefits, and alternatives have been discussed with the patient in detail. The patient states understanding and desires to proceed.  - ASA III - Hold iron for 7 days prior to procedures. 4.  Continue to avoid all NSAIDs. 5.  Follow-up after procedures.

## 2020-04-09 ENCOUNTER — Ambulatory Visit: Payer: Medicare HMO | Admitting: Gastroenterology

## 2020-04-09 ENCOUNTER — Encounter: Payer: Self-pay | Admitting: Gastroenterology

## 2020-04-09 ENCOUNTER — Encounter: Payer: Self-pay | Admitting: *Deleted

## 2020-04-09 VITALS — BP 123/67 | HR 76 | Temp 96.9°F | Ht 62.0 in | Wt 210.8 lb

## 2020-04-09 DIAGNOSIS — D649 Anemia, unspecified: Secondary | ICD-10-CM

## 2020-04-09 DIAGNOSIS — Z8619 Personal history of other infectious and parasitic diseases: Secondary | ICD-10-CM

## 2020-04-09 DIAGNOSIS — K269 Duodenal ulcer, unspecified as acute or chronic, without hemorrhage or perforation: Secondary | ICD-10-CM | POA: Diagnosis not present

## 2020-04-09 DIAGNOSIS — Z86010 Personal history of colonic polyps: Secondary | ICD-10-CM | POA: Diagnosis not present

## 2020-04-09 DIAGNOSIS — K253 Acute gastric ulcer without hemorrhage or perforation: Secondary | ICD-10-CM

## 2020-04-09 MED ORDER — OMEPRAZOLE 40 MG PO CPDR: 40.0000 mg | DELAYED_RELEASE_CAPSULE | Freq: Two times a day (BID) | ORAL | 2 refills | Status: DC

## 2020-04-09 NOTE — Patient Instructions (Addendum)
Please have labs completed at [facility].  We will arrange for you to have an upper endoscopy and colonoscopy in the near future with Dr. Marletta Lor. You will need to stop iron 7 days prior to your procedure.  Increase omeprazole to 40 mg twice daily 30 minutes before breakfast and dinner for now.  Continue to avoid all NSAID products.   We will plan to see you back after your procedures.  Do not hesitate to call if you have questions or concerns prior.  Ermalinda Memos, PA-C [facility]

## 2020-04-10 ENCOUNTER — Telehealth: Payer: Self-pay | Admitting: *Deleted

## 2020-04-10 ENCOUNTER — Encounter: Payer: Self-pay | Admitting: *Deleted

## 2020-04-10 NOTE — Telephone Encounter (Signed)
PA approved via [employer] for TCS/EGD. Auth# [id number] DOS 05/13/2020-06/12/2020

## 2020-04-11 ENCOUNTER — Encounter: Payer: Self-pay | Admitting: Gastroenterology

## 2020-04-14 NOTE — Progress Notes (Signed)
Cc'ed to pcp °

## 2020-05-01 ENCOUNTER — Other Ambulatory Visit: Payer: Self-pay

## 2020-05-01 ENCOUNTER — Encounter: Payer: Self-pay | Admitting: Orthopedic Surgery

## 2020-05-01 ENCOUNTER — Ambulatory Visit (INDEPENDENT_AMBULATORY_CARE_PROVIDER_SITE_OTHER): Payer: Medicare HMO | Admitting: Orthopedic Surgery

## 2020-05-01 VITALS — BP 140/70 | HR 72 | Ht 62.0 in | Wt 210.0 lb

## 2020-05-01 DIAGNOSIS — M23321 Other meniscus derangements, posterior horn of medial meniscus, right knee: Secondary | ICD-10-CM | POA: Diagnosis not present

## 2020-05-01 DIAGNOSIS — D649 Anemia, unspecified: Secondary | ICD-10-CM | POA: Diagnosis not present

## 2020-05-01 DIAGNOSIS — G8929 Other chronic pain: Secondary | ICD-10-CM | POA: Diagnosis not present

## 2020-05-01 DIAGNOSIS — M1710 Unilateral primary osteoarthritis, unspecified knee: Secondary | ICD-10-CM | POA: Diagnosis not present

## 2020-05-01 DIAGNOSIS — M25561 Pain in right knee: Secondary | ICD-10-CM

## 2020-05-01 NOTE — Patient Instructions (Signed)
Preparing for Knee Replacement Getting prepared before knee replacement surgery can make recovery easier and more comfortable. This document provides some tips and guidelines that will help you prepare for your surgery. Talk with your health care provider so you can learn what to expect before, during, and after surgery. Ask questions if you do not understand something. Tell a health care provider about:  Any allergies you have.  All medicines you are taking, including vitamins, herbs, eye drops, creams, and over-the-counter medicines.  Any problems you or family members have had with anesthetic medicines.  Any blood disorders you have.  Any surgeries you have had.  Any medical conditions you have.  Whether you are pregnant or may be pregnant. What happens before the procedure? Visit your health care providers Keep all appointments before surgery. You will need to have a physical exam before surgery (preoperative exam) to make sure it is safe for you to have knee replacement surgery. You may also need to have more tests. When you go to the exam, bring a list of all the medicines and supplements, including herbs and vitamins, that you take. Have dental care and routine cleanings done before surgery. Germs from anywhere in your body, including your mouth, can travel to your new joint and infect it. Tell your dentist that you plan to have knee replacement surgery. Know the costs of surgery To find out how much the surgery will cost, call your insurance company as soon as you decide to have surgery. Ask questions like:  How much of the surgery and hospital stay will be covered?  What will be covered for: ? Medical equipment? ? Rehabilitation facilities? ? Home care? Prepare your home  Pick a recovery spot that is not your bed. It is better that you sit more upright during recovery. You may want to use a recliner.  Place items that you often use on a small table near your recovery spot.  These may include the TV remote, a cordless phone or your mobile phone, a book, a laptop computer, and a water glass.  Move other items you will need to shelves and drawers that are at countertop height. Do this in your kitchen, bathroom, and bedroom.  You may be given a walker to use at home. Check if you will have enough room to use a walker. Move around your home with your hands out about 6 inches (15 cm) from your sides. You will have enough room if you do not hit anything with your hands as you do this. Walk from: ? Your recovery spot to your kitchen and bathroom. ? Your bed to the bathroom.  Prepare some meals to freeze and reheat later. Make your home safe for recovery  Remove all clutter and throw rugs from your floors. This will help you avoid tripping.  Consider getting safety equipment that will be helpful during your recovery, such as: ? Grab bars in the shower and near the toilet. ? A raised toilet seat. This will help you get on and off the toilet more easily. ? A tub or shower bench.      Prepare your body  If you smoke, quit as soon as you can before surgery. If there is time, it is best to quit several months before surgery. Tell your surgeon if you use any products that contain nicotine or tobacco. These products include cigarettes, chewing tobacco, and vaping devices, such as e-cigarettes. These can delay healing. If you need help quitting, ask your health   care provider.  Talk to your health care provider about doing exercises before your surgery. ? Doing these exercises in the weeks before your surgery may help reduce pain and improve function after surgery. ? Be sure to follow the exercise program given by your health care provider.  Maintain a healthy diet. Do not change your diet before surgery unless your health care provider tells you to do that.  Do not drink any alcohol for at least 48 hours before surgery. Plan your recovery In the first couple of weeks  after surgery, it will be harder for you to do some of your regular activities. You may get tired easily, and you will have limited movement in your leg. To make sure you have all the help you need after your surgery:  Plan to have a responsible adult take you home from the hospital. Your health care provider will tell you how many days you can expect to be in the hospital.  Cancel all work, caregiving, and volunteer responsibilities for at least 4-6 weeks after surgery.  Plan to have a responsible adult stay with you day and night for the first week. This person should be someone you are comfortable with. You may need this person to help you with your exercises and personal care, such as bathing and using the toilet.  If you live alone, arrange for someone to take care of your home and pets for the first 4-6 weeks after surgery.  Arrange for drivers to take you to follow-up visits, the grocery store, and other places you may need to go for at least 4-6 weeks.  Consider applying for a disability parking permit. To get an application, call your local department of motor vehicles [facility]) or your health care provider's office. Summary  Getting prepared before knee replacement surgery can make your recovery easier and more comfortable.  Keep all visits to your health care provider before surgery. You will have an exam and may have tests to make sure that you are ready for your surgery.  Prepare your home and arrange for help at home.  Plan to have a responsible adult take you home from the hospital. Also, plan to have a responsible adult stay with you day and night for the first week after you leave the hospital. This information is not intended to replace advice given to you by your health care provider. Make sure you discuss any questions you have with your health care provider. Document Revised: 09/18/2019 Document Reviewed: 09/18/2019 Elsevier Patient Education  2021 [place].  Knee  Arthroscopy Knee arthroscopy is a surgery to examine the inside of the knee joint and repair any damage to cartilage, surfaces, and other soft tissues around the joint. You may have this surgery if nonsurgical treatment has not relieved your symptoms. Knee arthroscopy may be used to:  Repair a torn ligament or other torn tissues. Ligaments are tissues that connect bones to each other.  Remove bone fragments.  Remove a fluid-filled sac (cyst).  Treat kneecap (patella)problems.  Treat septic knee. This is an advanced infection in the knee. Arthroscopic surgery is done using a thin tube that has a light and camera on the end of it (arthroscope). The arthroscope is placed through a small incision, and the camera sends images to a screen in the operating room. The images are used to help perform the surgery. Tell a health care provider about:  Any allergies you have.  All medicines you are taking, including vitamins, herbs, eye  drops, creams, and over-the-counter medicines.  Any problems you or family members have had with anesthetic medicines.  Any blood disorders you have.  Any surgeries you have had.  Any medical conditions you have.  Whether you are pregnant or may be pregnant. What are the risks? Generally, this is a safe procedure. However, problems may occur, including:  Infection.  Bleeding.  Allergic reactions to medicines.  Damage to blood vessels, nerves, or tissues in the knee.  A blood clot that forms in the leg and travels to the lung (pulmonary embolism).  Failure of the surgery to relieve symptoms.  Knee stiffness. What happens before the procedure? Staying hydrated Follow instructions from your health care provider about hydration, which may include:  Up to 2 hours before the procedure - you may continue to drink clear liquids, such as water, clear fruit juice, black coffee, and plain tea.   Eating and drinking restrictions Follow instructions from your  health care provider about eating and drinking, which may include:  8 hours before the procedure - stop eating heavy meals or foods, such as meat, fried foods, or fatty foods.  6 hours before the procedure - stop eating light meals or foods, such as toast or cereal.  6 hours before the procedure - stop drinking milk or drinks that contain milk.  2 hours before the procedure - stop drinking clear liquids. Medicines Ask your health care provider about:  Changing or stopping your regular medicines. This is especially important if you are taking diabetes medicines or blood thinners.  Taking medicines such as aspirin and ibuprofen. These medicines can thin your blood. Do not take these medicines unless your health care provider tells you to take them.  Taking over-the-counter medicines, vitamins, herbs, and supplements. General instructions  You may have a physical exam and tests, such as an X-ray, CT scan, or MRI.  Do not drink alcohol unless your health care provider says that you can.  Do not use any products that contain nicotine or tobacco for at least 4 weeks before the procedure. These products include cigarettes, e-cigarettes, and chewing tobacco. If you need help quitting, ask your health care provider.  Plan to have a responsible adult take you home from the hospital or clinic.  If you will be going home right after the procedure, plan to have a responsible adult care for you for the time you are told. This is important.  Ask your health care provider: ? How your surgery site will be marked. ? What steps will be taken to help prevent infection. These steps may include:  Removing hair at the surgery site.  Washing skin with a germ-killing soap.  Taking antibiotic medicine. What happens during the procedure?  An IV will be inserted into one of your veins.  You will be given one or more of the following: ? A medicine to help you relax (sedative). ? A medicine to numb the  knee area (local anesthetic). ? A medicine to make you fall asleep (general anesthetic). ? A medicine that is injected into an area of your body to numb everything below the injection site (regional anesthetic). This may be injected into your groin or thigh.  A cuff may be placed around your upper leg to slow blood flow to your lower leg during the procedure.  Several small incisions will be made around your knee.  Your knee joint will be rinsed (flushed) and filled with sterile saline. This is a germ-free solution made of salt  and water. This expands the area to help your surgeon see your joint more clearly.  An arthroscope will be passed through one of your incisions, into your knee joint.  Other surgical instruments will be passed through the other incisions. Then, your surgeon will examine and repair your knee as needed.  The sterile saline will be drained from your knee, and the cuff will be removed from your upper leg.  Your incisions will be closed with adhesive strips or stitches, also called sutures, and covered with a bandage (dressing). The procedure may vary among health care providers and hospitals.   What happens after the procedure?  Your blood pressure, heart rate, breathing rate, and blood oxygen level will be monitored until you leave the hospital or clinic.  You will be given pain medicine as needed.  You may be given medicine to lower your risk of blood clots.  You may have to wear compression stockings. These stockings help to prevent blood clots and reduce swelling in your legs.  You may be given a knee brace or immobilizer.  Do not drive or use machinery until your health care provider approves.   Summary  Knee arthroscopy is a surgery to examine or repair the inside of your knee joint.  Before the procedure, follow instructions from your health care provider about eating and drinking.  Plan to have a responsible adult take you home from the hospital or  clinic. This information is not intended to replace advice given to you by your health care provider. Make sure you discuss any questions you have with your health care provider. Document Revised: 07/30/2019 Document Reviewed: 07/30/2019 Elsevier Patient Education  [place].

## 2020-05-01 NOTE — Progress Notes (Signed)
MRI RESULTS FOLLOW UP   Encounter Diagnoses  Name Primary?  . Derangement of posterior horn of medial meniscus of right knee Yes  . Chronic pain of right knee   . Primary localized osteoarthritis of knee    ASSESSMENT AND PLAN :  We recommend surgical intervention but leave it up to the patient whether she would do arthroscopy or replacement, we also gave her the option of injection and pain medication .  [name] would like to wait until her colonoscopy is done before she decides whether to have a knee arthroscopy or knee replacement  She lives in [place], she has 3 steps to get in the house, she lives with her daughter who is a 35-year-old grandchild  She is not having heart disease or diabetes no serious medical conditions and she is a candidate for same-day surgery for total knee replacement if she chooses that option     Chief Complaint  Patient presents with  . Knee Pain    Right   . Results    Review MRI scan     History is a 70 year old female fell on her knee back in 2020 around August thought to have contusion was treated with normal measures of pain control active range of motion general exercises has not improved presents with continued medial knee pain swelling and loss of motion especially flexion   Review of systems no chest pain or shortness of breath  Victoria Reyes is still having pain in her right knee and we are here today to discuss her MRI no real change in symptoms.  She is concerned that just have a colonoscopy in a week or 2 and does not want to do any surgery until that time      BP 140/70   Pulse 72   Ht 5\' 2"  (1.575 m)   Wt 210 lb (95.3 kg)   BMI 38.41 kg/m   Past Medical History:  Diagnosis Date  . Anemia    as a  young woman  . Diverticulitis 2005   CT   . DJD (degenerative joint disease)   . GERD (gastroesophageal reflux disease)   . H. pylori infection 01/2020   S/p treatment with omeprazole, amoxicillin, and Biaxin.  Marland Kitchen  Headache(784.0)   . Hiatal hernia   . Hyperlipidemia   . Hypertension   . Hypothyroidism   . Knee pain, right   . Low back pain   . Neuropathy    peroneal nerve  . Obesity   . Pneumonia    as child  . Pre-diabetes   . S/P colonoscopy 2005   Dr. Tamala Julian: sigmoid diverticulosis  . Thyroid disease   . Tobacco abuse    MY READING OF THE MRI  Arthritis all 3 compartments severe Medial meniscus complex tear   MRI REPORT:   CLINICAL DATA:  Chronic knee pain for 1 year. Status post fall. Constant swelling.   EXAM: MRI OF THE RIGHT KNEE WITHOUT CONTRAST   TECHNIQUE: Multiplanar, multisequence MR imaging of the knee was performed. No intravenous contrast was administered.   COMPARISON:  None.   FINDINGS: MENISCI   Medial: Small radial tear of the free edge of the posterior horn of the medial meniscus. Complex tear of the body of the medial meniscus.   Lateral: Degeneration of the lateral meniscus. Fraying along the free edge of the body of the lateral meniscus.   LIGAMENTS   Cruciates: Intact PCL. ACL is intact. ACL is increased in signal and mildly  expanded as can be seen with mucinous degeneration.   Collaterals: Medial collateral ligament is intact. Lateral collateral ligament complex is intact.   CARTILAGE   Patellofemoral: Partial-thickness cartilage loss of the patellofemoral compartment.   Medial: High-grade partial-thickness cartilage loss with areas of full-thickness cartilage loss of the medial femorotibial compartment mild subchondral reactive marrow edema and marginal osteophytes.   Lateral: Partial thickness cartilage loss of the lateral femorotibial compartment. Small marginal osteophytes.   JOINT: Small joint effusion. Normal Hoffa's fat-pad. No plical thickening.   POPLITEAL FOSSA: Popliteus tendon is intact. Small Baker's cyst.   EXTENSOR MECHANISM: Intact quadriceps tendon. Intact patellar tendon. Intact lateral patellar retinaculum.  Intact medial patellar retinaculum. Intact MPFL.   BONES: No aggressive osseous lesion. No fracture or dislocation.   Other: No fluid collection or hematoma. Muscles are normal.   IMPRESSION: 1. Small radial tear of the free edge of the posterior horn of the medial meniscus. Complex tear of the body of the medial meniscus. 2. Degeneration of the lateral meniscus. Fraying along the free edge of the body of the lateral meniscus. 3. Tricompartmental cartilage abnormalities as described above most severe in the medial femorotibial compartment.     Electronically Signed   By: Kathreen Devoid   On: 04/08/2020 14:40

## 2020-05-02 LAB — CBC WITH DIFFERENTIAL/PLATELET
Basophils Absolute: 0.1 10*3/uL (ref 0.0–0.2)
Basos: 1 %
EOS (ABSOLUTE): 0.3 10*3/uL (ref 0.0–0.4)
Eos: 3 %
Hematocrit: 40.4 % (ref 34.0–46.6)
Hemoglobin: 12.8 g/dL (ref 11.1–15.9)
Immature Grans (Abs): 0 10*3/uL (ref 0.0–0.1)
Immature Granulocytes: 0 %
Lymphocytes Absolute: 3.9 10*3/uL — ABNORMAL HIGH (ref 0.7–3.1)
Lymphs: 45 %
MCH: 27.1 pg (ref 26.6–33.0)
MCHC: 31.7 g/dL (ref 31.5–35.7)
MCV: 86 fL (ref 79–97)
Monocytes Absolute: 0.6 10*3/uL (ref 0.1–0.9)
Monocytes: 7 %
Neutrophils Absolute: 3.9 10*3/uL (ref 1.4–7.0)
Neutrophils: 44 %
Platelets: 276 10*3/uL (ref 150–450)
RBC: 4.72 x10E6/uL (ref 3.77–5.28)
RDW: 14.2 % (ref 11.7–15.4)
WBC: 8.8 10*3/uL (ref 3.4–10.8)

## 2020-05-02 LAB — IRON,TIBC AND FERRITIN PANEL
Ferritin: 50 ng/mL (ref 15–150)
Iron Saturation: 17 % (ref 15–55)
Iron: 52 ug/dL (ref 27–139)
Total Iron Binding Capacity: 301 ug/dL (ref 250–450)
UIBC: 249 ug/dL (ref 118–369)

## 2020-05-06 ENCOUNTER — Encounter: Payer: Self-pay | Admitting: Family Medicine

## 2020-05-06 ENCOUNTER — Telehealth (INDEPENDENT_AMBULATORY_CARE_PROVIDER_SITE_OTHER): Payer: Medicare HMO | Admitting: Family Medicine

## 2020-05-06 ENCOUNTER — Other Ambulatory Visit: Payer: Self-pay

## 2020-05-06 VITALS — BP 138/88 | Ht 62.0 in | Wt 208.6 lb

## 2020-05-06 DIAGNOSIS — I10 Essential (primary) hypertension: Secondary | ICD-10-CM

## 2020-05-06 DIAGNOSIS — L659 Nonscarring hair loss, unspecified: Secondary | ICD-10-CM | POA: Diagnosis not present

## 2020-05-06 MED ORDER — MINOXIDIL 2.5 MG PO TABS: 2.5000 mg | ORAL_TABLET | Freq: Every day | ORAL | 3 refills | Status: DC

## 2020-05-06 NOTE — Assessment & Plan Note (Signed)
  Patient re-educated about  the importance of commitment to a  minimum of 150 minutes of exercise per week as able.  The importance of healthy food choices with portion control discussed, as well as eating regularly and within a 12 hour window most days. The need to choose "clean , green" food 50 to 75% of the time is discussed, as well as to make water the primary drink and set a goal of 64 ounces water daily.    Weight /BMI 05/06/2020 05/01/2020 04/09/2020  WEIGHT 208 lb 9.6 oz 210 lb 210 lb 12.8 oz  HEIGHT 5\' 2"  5\' 2"  5\' 2"   BMI 38.15 kg/m2 38.41 kg/m2 38.56 kg/m2

## 2020-05-06 NOTE — Patient Instructions (Addendum)
Follow-up in office to reevaluate blood pressure in 6 weeks, call if you need me sooner.  Keep May appt wit Dr Moshe Cipro  Please discontinue amlodipine for blood pressure.  You for your blood pressure is minoxidil 2.5 mg 1 daily.  Please stop using any chemicals straighteners  in your hair just shampoo and condition.  It is important that you exercise regularly at least 30 minutes 5 times a week. If you develop chest pain, have severe difficulty breathing, or feel very tired, stop exercising immediately and seek medical attention  Think about what you will eat, plan ahead. Choose " clean, green, fresh or frozen" over canned, processed or packaged foods which are more sugary, salty and fatty. 70 to 75% of food eaten should be vegetables and fruit. Three meals at set times with snacks allowed between meals, but they must be fruit or vegetables. Aim to eat over a 12 hour period , example 7 am to 7 pm, and STOP after  your last meal of the day. Drink water,generally about 64 ounces per day, no other drink is as healthy. Fruit juice is best enjoyed in a healthy way, by EATING the fruit. Thanks for choosing [facility], we consider it a privelige to serve you.

## 2020-05-06 NOTE — Assessment & Plan Note (Signed)
Likely a combination of chemical straightner as well as amlodipine. Advised to stop chemicals, and BP medication changed to minoxidil F/u eval in office in 5 weeks .

## 2020-05-06 NOTE — Progress Notes (Signed)
Virtual Visit via video visit I connected with Victoria Reyes on 05/06/20 at 10:20 AM EST by video and verified that I am speaking with the correct person using two identifiers.  Location: Patient: home Provider: work     History of Present Illness: C/o hair breaking and hair loss over the past several months, worsening, still uses chemicals to straighten her hair.  On amlodipine for BP   Observations/Objective: BP 138/88   Ht 5\' 2"  (1.575 m)   Wt 208 lb 9.6 oz (94.6 kg)   BMI 38.15 kg/m     Assessment and Plan:  Alopecia Likely a combination of chemical straightner as well as amlodipine. Advised to stop chemicals, and BP medication changed to minoxidil F/u eval in office in 5 weeks .  HTN (hypertension) Medication change due to s/e of hair loss, re evaluate in 5 weeks, new is minoxidil 2.5 mg daily DASH diet and commitment to daily physical activity for a minimum of 30 minutes discussed and encouraged, as a part of hypertension management. The importance of attaining a healthy weight is also discussed.  BP/Weight 05/06/2020 05/01/2020 04/09/2020 03/13/2020 03/11/2020 02/06/2020 79/05/4095  Systolic BP 353 299 242 683 419 622 297  Diastolic BP 88 70 67 69 73 76 53  Wt. (Lbs) 208.6 210 210.8 202 206.04 206 204.9  BMI 38.15 38.41 38.56 37.55 38.3 38.29 38.09       Morbid obesity ([place])  Patient re-educated about  the importance of commitment to a  minimum of 150 minutes of exercise per week as able.  The importance of healthy food choices with portion control discussed, as well as eating regularly and within a 12 hour window most days. The need to choose "clean , green" food 50 to 75% of the time is discussed, as well as to make water the primary drink and set a goal of 64 ounces water daily.    Weight /BMI 05/06/2020 05/01/2020 04/09/2020  WEIGHT 208 lb 9.6 oz 210 lb 210 lb 12.8 oz  HEIGHT 5\' 2"  5\' 2"  5\' 2"   BMI 38.15 kg/m2 38.41 kg/m2 38.56 kg/m2       Follow  Up Instructions:    I discussed the assessment and treatment plan with the patient. The patient was provided an opportunity to ask questions and all were answered. The patient agreed with the plan and demonstrated an understanding of the instructions.   The patient was advised to call back or seek an in-person evaluation if the symptoms worsen or if the condition fails to improve as anticipated.  I provided 12 minutes of non-face-to-face time during this encounter.   Tula Nakayama, MD

## 2020-05-06 NOTE — Assessment & Plan Note (Signed)
Medication change due to s/e of hair loss, re evaluate in 5 weeks, new is minoxidil 2.5 mg daily DASH diet and commitment to daily physical activity for a minimum of 30 minutes discussed and encouraged, as a part of hypertension management. The importance of attaining a healthy weight is also discussed.  BP/Weight 05/06/2020 05/01/2020 04/09/2020 03/13/2020 03/11/2020 02/06/2020 00/92/3300  Systolic BP 762 263 335 456 256 389 373  Diastolic BP 88 70 67 69 73 76 53  Wt. (Lbs) 208.6 210 210.8 202 206.04 206 204.9  BMI 38.15 38.41 38.56 37.55 38.3 38.29 38.09

## 2020-05-09 NOTE — Patient Instructions (Signed)
Victoria Reyes  05/09/2020     @PREFPERIOPPHARMACY @   Your procedure is scheduled on  05/13/2020   Report to [facility] at  34  A.M.    Call this number if you have problems the morning of surgery:  [phone]   Remember:  Follow the diet and prep instructions given to you by the office.                       Take these medicines the morning of surgery with A SIP OF WATER  Levothyroxine, prilosec.    Please brush your teeth.  Do not wear jewelry, make-up or nail polish.  Do not wear lotions, powders, or perfumes, or deodorant.  Do not shave 48 hours prior to surgery.  Men may shave face and neck.  Do not bring valuables to the hospital.  [facility] is not responsible for any belongings or valuables.  Contacts, dentures or bridgework may not be worn into surgery.  Leave your suitcase in the car.  After surgery it may be brought to your room.  For patients admitted to the hospital, discharge time will be determined by your treatment team.  Patients discharged the day of surgery will not be allowed to drive home.    Special instructions:  DO NOT smoke tobacco or vape the morning of your procedure.  Please read over the following fact sheets that you were given. Anesthesia Post-op Instructions and Care and Recovery After Surgery       Upper Endoscopy, Adult, Care After This sheet gives you information about how to care for yourself after your procedure. Your health care provider may also give you more specific instructions. If you have problems or questions, contact your health care provider. What can I expect after the procedure? After the procedure, it is common to have:  A sore throat.  Mild stomach pain or discomfort.  Bloating.  Nausea. Follow these instructions at home:  Follow instructions from your health care provider about what to eat or drink after your procedure.  Return to your normal activities as told by your health care  provider. Ask your health care provider what activities are safe for you.  Take over-the-counter and prescription medicines only as told by your health care provider.  If you were given a sedative during the procedure, it can affect you for several hours. Do not drive or operate machinery until your health care provider says that it is safe.  Keep all follow-up visits as told by your health care provider. This is important.   Contact a health care provider if you have:  A sore throat that lasts longer than one day.  Trouble swallowing. Get help right away if:  You vomit blood or your vomit looks like coffee grounds.  You have: ? A fever. ? Bloody, black, or tarry stools. ? A severe sore throat or you cannot swallow. ? Difficulty breathing. ? Severe pain in your chest or abdomen. Summary  After the procedure, it is common to have a sore throat, mild stomach discomfort, bloating, and nausea.  If you were given a sedative during the procedure, it can affect you for several hours. Do not drive or operate machinery until your health care provider says that it is safe.  Follow instructions from your health care provider about what to eat or drink after your procedure.  Return to your normal activities as told by your health  care provider. This information is not intended to replace advice given to you by your health care provider. Make sure you discuss any questions you have with your health care provider. Document Revised: 03/27/2019 Document Reviewed: 08/29/2017 Elsevier Patient Education  2021 [place].  Colonoscopy, Adult, Care After This sheet gives you information about how to care for yourself after your procedure. Your health care provider may also give you more specific instructions. If you have problems or questions, contact your health care provider. What can I expect after the procedure? After the procedure, it is common to have:  A small amount of blood in your  stool for 24 hours after the procedure.  Some gas.  Mild cramping or bloating of your abdomen. Follow these instructions at home: Eating and drinking  Drink enough fluid to keep your urine pale yellow.  Follow instructions from your health care provider about eating or drinking restrictions.  Resume your normal diet as instructed by your health care provider. Avoid heavy or fried foods that are hard to digest.   Activity  Rest as told by your health care provider.  Avoid sitting for a long time without moving. Get up to take short walks every 1-2 hours. This is important to improve blood flow and breathing. Ask for help if you feel weak or unsteady.  Return to your normal activities as told by your health care provider. Ask your health care provider what activities are safe for you. Managing cramping and bloating  Try walking around when you have cramps or feel bloated.  Apply heat to your abdomen as told by your health care provider. Use the heat source that your health care provider recommends, such as a moist heat pack or a heating pad. ? Place a towel between your skin and the heat source. ? Leave the heat on for 20-30 minutes. ? Remove the heat if your skin turns bright red. This is especially important if you are unable to feel pain, heat, or cold. You may have a greater risk of getting burned.   General instructions  If you were given a sedative during the procedure, it can affect you for several hours. Do not drive or operate machinery until your health care provider says that it is safe.  For the first 24 hours after the procedure: ? Do not sign important documents. ? Do not drink alcohol. ? Do your regular daily activities at a slower pace than normal. ? Eat soft foods that are easy to digest.  Take over-the-counter and prescription medicines only as told by your health care provider.  Keep all follow-up visits as told by your health care provider. This is  important. Contact a health care provider if:  You have blood in your stool 2-3 days after the procedure. Get help right away if you have:  More than a small spotting of blood in your stool.  Large blood clots in your stool.  Swelling of your abdomen.  Nausea or vomiting.  A fever.  Increasing pain in your abdomen that is not relieved with medicine. Summary  After the procedure, it is common to have a small amount of blood in your stool. You may also have mild cramping and bloating of your abdomen.  If you were given a sedative during the procedure, it can affect you for several hours. Do not drive or operate machinery until your health care provider says that it is safe.  Get help right away if you have a lot of  blood in your stool, nausea or vomiting, a fever, or increased pain in your abdomen. This information is not intended to replace advice given to you by your health care provider. Make sure you discuss any questions you have with your health care provider. Document Revised: 03/23/2019 Document Reviewed: 10/23/2018 Elsevier Patient Education  2021 [place] After This sheet gives you information about how to care for yourself after your procedure. Your health care provider may also give you more specific instructions. If you have problems or questions, contact your health care provider. What can I expect after the procedure? After the procedure, it is common to have:  Tiredness.  Forgetfulness about what happened after the procedure.  Impaired judgment for important decisions.  Nausea or vomiting.  Some difficulty with balance. Follow these instructions at home: For the time period you were told by your health care provider:  Rest as needed.  Do not participate in activities where you could fall or become injured.  Do not drive or use machinery.  Do not drink alcohol.  Do not take sleeping pills or medicines that cause  drowsiness.  Do not make important decisions or sign legal documents.  Do not take care of children on your own.      Eating and drinking  Follow the diet that is recommended by your health care provider.  Drink enough fluid to keep your urine pale yellow.  If you vomit: ? Drink water, juice, or soup when you can drink without vomiting. ? Make sure you have little or no nausea before eating solid foods. General instructions  Have a responsible adult stay with you for the time you are told. It is important to have someone help care for you until you are awake and alert.  Take over-the-counter and prescription medicines only as told by your health care provider.  If you have sleep apnea, surgery and certain medicines can increase your risk for breathing problems. Follow instructions from your health care provider about wearing your sleep device: ? Anytime you are sleeping, including during daytime naps. ? While taking prescription pain medicines, sleeping medicines, or medicines that make you drowsy.  Avoid smoking.  Keep all follow-up visits as told by your health care provider. This is important. Contact a health care provider if:  You keep feeling nauseous or you keep vomiting.  You feel light-headed.  You are still sleepy or having trouble with balance after 24 hours.  You develop a rash.  You have a fever.  You have redness or swelling around the IV site. Get help right away if:  You have trouble breathing.  You have new-onset confusion at home. Summary  For several hours after your procedure, you may feel tired. You may also be forgetful and have poor judgment.  Have a responsible adult stay with you for the time you are told. It is important to have someone help care for you until you are awake and alert.  Rest as told. Do not drive or operate machinery. Do not drink alcohol or take sleeping pills.  Get help right away if you have trouble breathing, or if  you suddenly become confused. This information is not intended to replace advice given to you by your health care provider. Make sure you discuss any questions you have with your health care provider. Document Revised: 12/13/2019 Document Reviewed: 03/01/2019 Elsevier Patient Education  2021 [employer].

## 2020-05-12 ENCOUNTER — Other Ambulatory Visit: Payer: Self-pay

## 2020-05-12 ENCOUNTER — Other Ambulatory Visit (HOSPITAL_COMMUNITY)
Admission: RE | Admit: 2020-05-12 | Discharge: 2020-05-12 | Disposition: A | Discharge status: Home / Self Care | Payer: Medicare HMO | Source: Ambulatory Visit | Attending: Internal Medicine | Admitting: Internal Medicine

## 2020-05-12 ENCOUNTER — Encounter (HOSPITAL_COMMUNITY)
Admission: RE | Admit: 2020-05-12 | Discharge: 2020-05-12 | Disposition: A | Discharge status: Home / Self Care | Payer: Medicare HMO | Source: Ambulatory Visit | Attending: Internal Medicine | Admitting: Internal Medicine

## 2020-05-12 ENCOUNTER — Encounter (HOSPITAL_COMMUNITY): Payer: Self-pay

## 2020-05-12 DIAGNOSIS — Z01812 Encounter for preprocedural laboratory examination: Secondary | ICD-10-CM | POA: Diagnosis not present

## 2020-05-12 DIAGNOSIS — Z20822 Contact with and (suspected) exposure to covid-19: Secondary | ICD-10-CM | POA: Insufficient documentation

## 2020-05-12 LAB — SARS CORONAVIRUS 2 (TAT 6-24 HRS): SARS Coronavirus 2: NEGATIVE

## 2020-05-13 ENCOUNTER — Encounter (HOSPITAL_COMMUNITY): Payer: Self-pay

## 2020-05-13 ENCOUNTER — Encounter (HOSPITAL_COMMUNITY)
Admission: RE | Disposition: A | Discharge status: Home / Self Care | Payer: Self-pay | Source: Home / Self Care | Attending: Internal Medicine

## 2020-05-13 ENCOUNTER — Other Ambulatory Visit: Payer: Self-pay

## 2020-05-13 ENCOUNTER — Ambulatory Visit (HOSPITAL_COMMUNITY): Payer: Medicare HMO | Admitting: Certified Registered"

## 2020-05-13 ENCOUNTER — Ambulatory Visit (HOSPITAL_COMMUNITY)
Admission: RE | Admit: 2020-05-13 | Discharge: 2020-05-13 | Disposition: A | Discharge status: Home / Self Care | Payer: Medicare HMO | Attending: Internal Medicine | Admitting: Internal Medicine

## 2020-05-13 DIAGNOSIS — Z882 Allergy status to sulfonamides status: Secondary | ICD-10-CM | POA: Diagnosis not present

## 2020-05-13 DIAGNOSIS — Z1211 Encounter for screening for malignant neoplasm of colon: Secondary | ICD-10-CM | POA: Insufficient documentation

## 2020-05-13 DIAGNOSIS — K449 Diaphragmatic hernia without obstruction or gangrene: Secondary | ICD-10-CM | POA: Diagnosis not present

## 2020-05-13 DIAGNOSIS — Z8711 Personal history of peptic ulcer disease: Secondary | ICD-10-CM | POA: Insufficient documentation

## 2020-05-13 DIAGNOSIS — K279 Peptic ulcer, site unspecified, unspecified as acute or chronic, without hemorrhage or perforation: Secondary | ICD-10-CM | POA: Diagnosis not present

## 2020-05-13 DIAGNOSIS — Z885 Allergy status to narcotic agent status: Secondary | ICD-10-CM | POA: Diagnosis not present

## 2020-05-13 DIAGNOSIS — K253 Acute gastric ulcer without hemorrhage or perforation: Secondary | ICD-10-CM

## 2020-05-13 DIAGNOSIS — K635 Polyp of colon: Secondary | ICD-10-CM | POA: Diagnosis not present

## 2020-05-13 DIAGNOSIS — Z79899 Other long term (current) drug therapy: Secondary | ICD-10-CM | POA: Insufficient documentation

## 2020-05-13 DIAGNOSIS — Z09 Encounter for follow-up examination after completed treatment for conditions other than malignant neoplasm: Secondary | ICD-10-CM | POA: Diagnosis not present

## 2020-05-13 DIAGNOSIS — Z79890 Hormone replacement therapy (postmenopausal): Secondary | ICD-10-CM | POA: Insufficient documentation

## 2020-05-13 DIAGNOSIS — K319 Disease of stomach and duodenum, unspecified: Secondary | ICD-10-CM | POA: Diagnosis not present

## 2020-05-13 DIAGNOSIS — K269 Duodenal ulcer, unspecified as acute or chronic, without hemorrhage or perforation: Secondary | ICD-10-CM

## 2020-05-13 DIAGNOSIS — Z86010 Personal history of colonic polyps: Secondary | ICD-10-CM | POA: Insufficient documentation

## 2020-05-13 DIAGNOSIS — K2970 Gastritis, unspecified, without bleeding: Secondary | ICD-10-CM

## 2020-05-13 DIAGNOSIS — K648 Other hemorrhoids: Secondary | ICD-10-CM | POA: Diagnosis not present

## 2020-05-13 DIAGNOSIS — D123 Benign neoplasm of transverse colon: Secondary | ICD-10-CM | POA: Insufficient documentation

## 2020-05-13 DIAGNOSIS — K5730 Diverticulosis of large intestine without perforation or abscess without bleeding: Secondary | ICD-10-CM | POA: Insufficient documentation

## 2020-05-13 DIAGNOSIS — Z886 Allergy status to analgesic agent status: Secondary | ICD-10-CM | POA: Diagnosis not present

## 2020-05-13 DIAGNOSIS — Z8719 Personal history of other diseases of the digestive system: Secondary | ICD-10-CM | POA: Insufficient documentation

## 2020-05-13 DIAGNOSIS — Z87891 Personal history of nicotine dependence: Secondary | ICD-10-CM | POA: Diagnosis not present

## 2020-05-13 DIAGNOSIS — K3189 Other diseases of stomach and duodenum: Secondary | ICD-10-CM | POA: Diagnosis not present

## 2020-05-13 DIAGNOSIS — K259 Gastric ulcer, unspecified as acute or chronic, without hemorrhage or perforation: Secondary | ICD-10-CM | POA: Diagnosis not present

## 2020-05-13 HISTORY — PX: COLONOSCOPY WITH PROPOFOL: SHX5780

## 2020-05-13 HISTORY — PX: BIOPSY: SHX5522

## 2020-05-13 HISTORY — PX: ESOPHAGOGASTRODUODENOSCOPY (EGD) WITH PROPOFOL: SHX5813

## 2020-05-13 HISTORY — PX: POLYPECTOMY: SHX5525

## 2020-05-13 SURGERY — COLONOSCOPY WITH PROPOFOL
Anesthesia: Monitor Anesthesia Care

## 2020-05-13 MED ORDER — LACTATED RINGERS IV SOLN: INTRAVENOUS | Status: DC | PRN

## 2020-05-13 MED ORDER — PROPOFOL 500 MG/50ML IV EMUL
INTRAVENOUS | Status: DC | PRN
  Administered 2020-05-13: 150 ug/kg/min via INTRAVENOUS

## 2020-05-13 MED ORDER — PROMETHAZINE HCL 25 MG/ML IJ SOLN: 6.2500 mg | INTRAMUSCULAR | Status: DC | PRN

## 2020-05-13 MED ORDER — OMEPRAZOLE 40 MG PO CPDR: 40.0000 mg | DELAYED_RELEASE_CAPSULE | Freq: Every day | ORAL | 5 refills | Status: DC

## 2020-05-13 MED ORDER — PROPOFOL 10 MG/ML IV BOLUS
INTRAVENOUS | Status: DC | PRN
  Administered 2020-05-13: 50 mg via INTRAVENOUS

## 2020-05-13 MED ORDER — LACTATED RINGERS IV SOLN: Freq: Once | INTRAVENOUS | Status: AC

## 2020-05-13 NOTE — Anesthesia Preprocedure Evaluation (Addendum)
Anesthesia Evaluation  Patient identified by MRN, date of birth, ID band Patient awake    Reviewed: Allergy & Precautions, NPO status , Patient's Chart, lab work & pertinent test results  History of Anesthesia Complications Negative for: history of anesthetic complications  Airway Mallampati: II  TM Distance: >3 FB Neck ROM: Full    Dental  (+) Missing,    Pulmonary former smoker,    Pulmonary exam normal        Cardiovascular hypertension, Normal cardiovascular exam     Neuro/Psych  Headaches, negative psych ROS   GI/Hepatic Neg liver ROS, hiatal hernia, PUD, GERD  Medicated and Controlled,  Endo/Other  Hypothyroidism   Renal/GU negative Renal ROS  negative genitourinary   Musculoskeletal  (+) Arthritis ,   Abdominal   Peds  Hematology negative hematology ROS (+)   Anesthesia Other Findings Day of surgery medications reviewed with patient.  Reproductive/Obstetrics negative OB ROS                            Anesthesia Physical Anesthesia Plan  ASA: II  Anesthesia Plan: MAC   Post-op Pain Management:    Induction:   PONV Risk Score and Plan: 2 and Treatment may vary due to age or medical condition and Propofol infusion  Airway Management Planned: Natural Airway and Nasal Cannula  Additional Equipment:   Intra-op Plan:   Post-operative Plan:   Informed Consent: I have reviewed the patients History and Physical, chart, labs and discussed the procedure including the risks, benefits and alternatives for the proposed anesthesia with the patient or authorized representative who has indicated his/her understanding and acceptance.       Plan Discussed with: CRNA  Anesthesia Plan Comments:        Anesthesia Quick Evaluation

## 2020-05-13 NOTE — Transfer of Care (Signed)
Immediate Anesthesia Transfer of Care Note  Patient: Victoria Reyes  Procedure(s) Performed: COLONOSCOPY WITH PROPOFOL (N/A ) ESOPHAGOGASTRODUODENOSCOPY (EGD) WITH PROPOFOL (N/A )  Patient Location: PACU  Anesthesia Type:General  Level of Consciousness: awake, alert , oriented and patient cooperative  Airway & Oxygen Therapy: Patient Spontanous Breathing  Post-op Assessment: Report given to RN, Post -op Vital signs reviewed and stable and Patient moving all extremities X 4  Post vital signs: Reviewed and stable  Last Vitals:  Vitals Value Taken Time  BP    Temp    Pulse    Resp    SpO2      Last Pain:  Vitals:   05/13/20 1246  TempSrc: Oral  PainSc: 8          Complications: No complications documented.

## 2020-05-13 NOTE — Anesthesia Postprocedure Evaluation (Signed)
Anesthesia Post Note  Patient: [name]  Procedure(s) Performed: COLONOSCOPY WITH PROPOFOL (N/A ) ESOPHAGOGASTRODUODENOSCOPY (EGD) WITH PROPOFOL (N/A ) BIOPSY POLYPECTOMY     Patient location during evaluation: PACU Anesthesia Type: MAC Level of consciousness: awake and alert Pain management: pain level controlled Vital Signs Assessment: post-procedure vital signs reviewed and stable Respiratory status: spontaneous breathing and respiratory function stable Cardiovascular status: stable Postop Assessment: no apparent nausea or vomiting Anesthetic complications: no   No complications documented.  Last Vitals:  Vitals:   05/13/20 1615 05/13/20 1625  BP: 135/73 (!) 153/68  Pulse: 81 61  Resp: 20 15  Temp:    SpO2: 99% 100%    Last Pain:  Vitals:   05/13/20 1610  TempSrc:   PainSc: 0-No pain                 Kervens Roper DANIEL

## 2020-05-13 NOTE — Discharge Instructions (Signed)
EGD Discharge instructions Please read the instructions outlined below and refer to this sheet in the next few weeks. These discharge instructions provide you with general information on caring for yourself after you leave the hospital. Your doctor may also give you specific instructions. While your treatment has been planned according to the most current medical practices available, unavoidable complications occasionally occur. If you have any problems or questions after discharge, please call your doctor. ACTIVITY  You may resume your regular activity but move at a slower pace for the next 24 hours.   Take frequent rest periods for the next 24 hours.   Walking will help expel (get rid of) the air and reduce the bloated feeling in your abdomen.   No driving for 24 hours (because of the anesthesia (medicine) used during the test).   You may shower.   Do not sign any important legal documents or operate any machinery for 24 hours (because of the anesthesia used during the test).  NUTRITION  Drink plenty of fluids.   You may resume your normal diet.   Begin with a light meal and progress to your normal diet.   Avoid alcoholic beverages for 24 hours or as instructed by your caregiver.  MEDICATIONS  You may resume your normal medications unless your caregiver tells you otherwise.  WHAT YOU CAN EXPECT TODAY  You may experience abdominal discomfort such as a feeling of fullness or gas pains.  FOLLOW-UP  Your doctor will discuss the results of your test with you.  SEEK IMMEDIATE MEDICAL ATTENTION IF ANY OF THE FOLLOWING OCCUR:  Excessive nausea (feeling sick to your stomach) and/or vomiting.   Severe abdominal pain and distention (swelling).   Trouble swallowing.   Temperature over 101 F (37.8 C).   Rectal bleeding or vomiting of blood.    Colonoscopy Discharge Instructions  Read the instructions outlined below and refer to this sheet in the next few weeks. These  discharge instructions provide you with general information on caring for yourself after you leave the hospital. Your doctor may also give you specific instructions. While your treatment has been planned according to the most current medical practices available, unavoidable complications occasionally occur.   ACTIVITY  You may resume your regular activity, but move at a slower pace for the next 24 hours.   Take frequent rest periods for the next 24 hours.   Walking will help get rid of the air and reduce the bloated feeling in your belly (abdomen).   No driving for 24 hours (because of the medicine (anesthesia) used during the test).    Do not sign any important legal documents or operate any machinery for 24 hours (because of the anesthesia used during the test).  NUTRITION  Drink plenty of fluids.   You may resume your normal diet as instructed by your doctor.   Begin with a light meal and progress to your normal diet. Heavy or fried foods are harder to digest and may make you feel sick to your stomach (nauseated).   Avoid alcoholic beverages for 24 hours or as instructed.  MEDICATIONS  You may resume your normal medications unless your doctor tells you otherwise.  WHAT YOU CAN EXPECT TODAY  Some feelings of bloating in the abdomen.   Passage of more gas than usual.   Spotting of blood in your stool or on the toilet paper.  IF YOU HAD POLYPS REMOVED DURING THE COLONOSCOPY:  No aspirin products for 7 days or as instructed.  No alcohol for 7 days or as instructed.   Eat a soft diet for the next 24 hours.  FINDING OUT THE RESULTS OF YOUR TEST Not all test results are available during your visit. If your test results are not back during the visit, make an appointment with your caregiver to find out the results. Do not assume everything is normal if you have not heard from your caregiver or the medical facility. It is important for you to follow up on all of your test results.   SEEK IMMEDIATE MEDICAL ATTENTION IF:  You have more than a spotting of blood in your stool.   Your belly is swollen (abdominal distention).   You are nauseated or vomiting.   You have a temperature over 101.   You have abdominal pain or discomfort that is severe or gets worse throughout the day.   Your EGD was relatively unremarkable.  All of the ulcerations previously seen in your stomach and small bowel have healed up nicely.  I want you to decrease your omeprazole to once daily.  Continue to avoid NSAIDs.  I did take biopsies to ensure H. pylori eradication.  Await pathology results, my office will contact you.  Your colonoscopy revealed 2 polyp(s) which I removed successfully. Await pathology results, my office will contact you. I recommend repeating colonoscopy in 5 years for surveillance purposes.    Follow-up with GI as needed  I hope you have a great rest of your week!  Elon Alas. Abbey Chatters, D.O. Gastroenterology and Hepatology [facility]

## 2020-05-13 NOTE — Op Note (Signed)
[facility] Patient Name: Victoria Reyes Procedure Date : 05/13/2020 MRN: [id number] Attending MD: Elon Alas. Abbey Chatters , DO Date of Birth: May 16, 1950 CSN: [id number] Age: 70 Admit Type: Outpatient Procedure:                Colonoscopy Indications:              High risk colon cancer surveillance: Personal                            history of colonic polyps Providers:                Elon Alas. Abbey Chatters, DO, Doristine Johns, RN,                            Lesia Sago, Technician Referring MD:              Medicines:                See the Anesthesia note for documentation of the                            administered medications Complications:            No immediate complications. Estimated Blood Loss:     Estimated blood loss was minimal. Procedure:                Pre-Anesthesia Assessment:                           - The anesthesia plan was to use monitored                            anesthesia care (MAC).                           After obtaining informed consent, the colonoscope                            was passed under direct vision. Throughout the                            procedure, the patient's blood pressure, pulse, and                            oxygen saturations were monitored continuously. The                            PCF-H190DL ([id number] Olympus pediatric colonoscope                            was introduced through the anus and advanced to the                            the cecum, identified by appendiceal orifice and                            ileocecal valve. The colonoscopy was performed  without difficulty. The patient tolerated the                            procedure well. The quality of the bowel                            preparation was evaluated using the BBPS [facility] Show Low                            Bowel Preparation Scale) with scores of: Right                            Colon = 3, Transverse Colon = 3 and Left  Colon = 3                            (entire mucosa seen well with no residual staining,                            small fragments of stool or opaque liquid). The                            total BBPS score equals 9. Scope In: 3:34:21 PM Scope Out: 3:56:00 PM Scope Withdrawal Time: 0 hours 12 minutes 9 seconds  Total Procedure Duration: 0 hours 21 minutes 39 seconds  Findings:      The perianal and digital rectal examinations were normal.      Non-bleeding internal hemorrhoids were found during endoscopy.      Multiple small-mouthed diverticula were found in the sigmoid colon and       descending colon.      A 4 mm polyp was found in the sigmoid colon. The polyp was sessile. The       polyp was removed with a cold snare. Resection and retrieval were       complete.      A 5 mm polyp was found in the transverse colon. The polyp was sessile.       The polyp was removed with a cold snare. Resection and retrieval were       complete.      The exam was otherwise without abnormality. Impression:               - Non-bleeding internal hemorrhoids.                           - Diverticulosis in the sigmoid colon and in the                            descending colon.                           - One 4 mm polyp in the sigmoid colon, removed with                            a cold snare. Resected and retrieved.                           -  One 5 mm polyp in the transverse colon, removed                            with a cold snare. Resected and retrieved.                           - The examination was otherwise normal. Recommendation:           - Patient has a contact number available for                            emergencies. The signs and symptoms of potential                            delayed complications were discussed with the                            patient. Return to normal activities tomorrow.                            Written discharge instructions were provided to the                             patient.                           - Resume previous diet.                           - Continue present medications.                           - Await pathology results.                           - Repeat colonoscopy in 5 years for surveillance.                           - Return to GI clinic PRN. Procedure Code(s):        --- Professional ---                           [phone], Colonoscopy, flexible; with removal of                            tumor(s), polyp(s), or other lesion(s) by snare                            technique Diagnosis Code(s):        --- Professional ---                           Z86.010, Personal history of colonic polyps                           K64.8, Other hemorrhoids  K63.5, Polyp of colon                           K57.30, Diverticulosis of large intestine without                            perforation or abscess without bleeding CPT copyright 2019 American Medical Association. All rights reserved. The codes documented in this report are preliminary and upon coder review may  be revised to meet current compliance requirements. Elon Alas. Abbey Chatters, DO [place] Abbey Chatters, DO 05/13/2020 4:04:01 PM This report has been signed electronically. Number of Addenda: 0

## 2020-05-13 NOTE — Op Note (Signed)
[facility] Patient Name: Victoria Reyes Procedure Date : 05/13/2020 MRN: [id number] Attending MD: Elon Alas. Abbey Chatters , DO Date of Birth: 1951-03-09 CSN: [id number] Age: 70 Admit Type: Outpatient Procedure:                Upper GI endoscopy Indications:              Follow-up of peptic ulcer Providers:                Elon Alas. Abbey Chatters, DO, Doristine Johns, RN,                            Lesia Sago, Technician Referring MD:              Medicines:                See the Anesthesia note for documentation of the                            administered medications Complications:            No immediate complications. Estimated Blood Loss:     Estimated blood loss was minimal. Procedure:                Pre-Anesthesia Assessment:                           - The anesthesia plan was to use monitored                            anesthesia care (MAC).                           After obtaining informed consent, the endoscope was                            passed under direct vision. Throughout the                            procedure, the patient's blood pressure, pulse, and                            oxygen saturations were monitored continuously. The                            [id number]-H190 ([id number]) Olympus gastroscope was                            introduced through the mouth, and advanced to the                            second part of duodenum. The upper GI endoscopy was                            accomplished without difficulty. The patient                            tolerated the procedure well. Scope In:  Scope Out: Findings:      There is no endoscopic evidence of areas of erosion, esophagitis,       ulcerations or varices in the entire esophagus.      Localized mild inflammation characterized by erythema was found in the       gastric antrum. Biopsies were taken with a cold forceps for Helicobacter       pylori testing. Gastric ulcers have healed.      The duodenal  bulb, first portion of the duodenum and second portion of       the duodenum were normal. No evidence of dudoenal ulceration. Impression:               - Gastritis. Biopsied.                           - Normal duodenal bulb, first portion of the                            duodenum and second portion of the duodenum. Recommendation:           - Patient has a contact number available for                            emergencies. The signs and symptoms of potential                            delayed complications were discussed with the                            patient. Return to normal activities tomorrow.                            Written discharge instructions were provided to the                            patient.                           - Resume previous diet.                           - Continue present medications.                           - Await pathology results.                           - Return to GI clinic PRN.                           - Decrease PPI to once daily. Procedure Code(s):        --- Professional ---                           [phone], Esophagogastroduodenoscopy, flexible,                            transoral; with biopsy,  single or multiple Diagnosis Code(s):        --- Professional ---                           K29.70, Gastritis, unspecified, without bleeding                           K27.9, Peptic ulcer, site unspecified, unspecified                            as acute or chronic, without hemorrhage or                            perforation CPT copyright 2019 American Medical Association. All rights reserved. The codes documented in this report are preliminary and upon coder review may  be revised to meet current compliance requirements. Elon Alas. Abbey Chatters, DO [place] Abbey Chatters, DO 05/13/2020 4:02:04 PM This report has been signed electronically. Number of Addenda: 0

## 2020-05-13 NOTE — H&P (Signed)
Primary Care Physician:  Fayrene Helper, MD Primary Gastroenterologist:  Dr. Abbey Chatters  Pre-Procedure History & Physical: HPI:  Victoria Reyes is a 70 y.o. female is here for an EGD due to history of gastric and duodenal ulcers, H pylori + and a colonoscopy to be performed for surveillance purposes due to history of adenomatous colon polyps.  Patient was admitted to [facility] 01/28/2020-01/31/2020 with acute GI bleed. Patient reported acute onset melena and cranberry red color on toilet tissue that began 10/18.  Hemoglobin 12.5 on admission.  Stool heme positive.  Admitted to using ibuprofen and naproxen fairly routinely for right hip and back pain.  Not on PPI routinely.  EGD with 3 gastric ulcers with clean base, 5 ulcers in the duodenal bulb and first portion of duodenum, 2 were noted to be oozing, treated with bipolar cautery.  Gastric biopsies consistent with H. pylori gastritis.  Recommended treating H. pylori at time of discharge with omeprazole, amoxicillin, and Biaxin, continue PPI twice daily, repeat EGD in 10-12 weeks and consider surveillance colonoscopy at that time.  Notably, hemoglobin stabilized around 9.9 by time of discharge.  Hemoglobin 11 on 02/07/2020  Past Medical History:  Diagnosis Date  . Anemia    as a  young woman  . Diverticulitis 2005   CT   . DJD (degenerative joint disease)   . GERD (gastroesophageal reflux disease)   . H. pylori infection 01/2020   S/p treatment with omeprazole, amoxicillin, and Biaxin.  Marland Kitchen Headache(784.0)   . Hiatal hernia   . Hyperlipidemia   . Hypertension   . Hypothyroidism   . Knee pain, right   . Low back pain   . Neuropathy    peroneal nerve  . Obesity   . Pneumonia    as child  . S/P colonoscopy 2005   Dr. Tamala Julian: sigmoid diverticulosis  . Thyroid disease   . Tobacco abuse     Past Surgical History:  Procedure Laterality Date  . BACK SURGERY    . BIOPSY  01/29/2020   Procedure: BIOPSY;  Surgeon: Montez Morita, Quillian Quince, MD;  [facility];  Service: Gastroenterology;;  . COLONOSCOPY  12/18/2010   Procedure: COLONOSCOPY;  Surgeon: Dorothyann Peng, MD; 3 mm sessile polyp at hepatic flexure, pancolonic diverticulosis, internal hemorrhoids, anal lesion just above the dentate line biopsied via cold forceps.  Colonic polyp was a tubular adenoma.  Anus biopsy with mild active inflammation associated with reactive epithelial changes and hemorrhage.  Recommended repeat colonoscopy in 2022.  . cyst removal right wrist    . ESOPHAGOGASTRODUODENOSCOPY (EGD) WITH PROPOFOL N/A 01/29/2020   Procedure: ESOPHAGOGASTRODUODENOSCOPY (EGD) WITH PROPOFOL;  Surgeon: Montez Morita, Quillian Quince, MD;   3 gastric ulcers with clean base, 5 ulcers in the duodenal bulb and first portion of duodenum, 2 were noted to be oozing, treated with bipolar cautery.  Gastric biopsies consistent with H. pylori gastritis.  Marland Kitchen EYE SURGERY Bilateral    cataract surgery with lens implant  . TONSILLECTOMY    . TOTAL SHOULDER ARTHROPLASTY Left 12/21/2016   Procedure: TOTAL SHOULDER ARTHROPLASTY;  Surgeon: Meredith Pel, MD;  Location: [place];  Service: Orthopedics;  Laterality: Left;  . TUBAL LIGATION    . umbillical hernia      Prior to Admission medications   Medication Sig Start Date End Date Taking? Authorizing Provider  Cholecalciferol (VITAMIN D) 50 MCG (2000 UT) CAPS Take 2,000 Units by mouth daily at 12 noon.   Yes [provider]  Ferrous Sulfate ([facility] IRON PO) Take 65 mg by mouth daily.   Yes [provider]  levothyroxine (SYNTHROID) 100 MCG tablet Take 100 mcg by mouth See admin instructions. Take 100 mg tablet by mouth every Monday, Wednesday, Friday and Sunday. Take 50 mg tablet every Tuesday, Thursday and Saturday 02/08/20  Yes Fayrene Helper, MD  minoxidil (LONITEN) 2.5 MG tablet Take 1 tablet (2.5 mg total) by mouth daily. 05/06/20  Yes Fayrene Helper, MD  omeprazole (PRILOSEC) 40 MG  capsule Take 1 capsule (40 mg total) by mouth 2 (two) times daily before a meal. 04/09/20  Yes Aliene Altes S, PA-C    Allergies as of 04/09/2020 - Review Complete 04/09/2020  Allergen Reaction Noted  . Nsaids Other (See Comments) 03/16/2020  . Hydrocodone Nausea Only 06/14/2006  . Sulfa antibiotics Itching 08/03/2011    Family History  Problem Relation Age of Onset  . Heart attack Sister        pacemaker  . Kidney failure Sister        on dialysis  . Colon cancer Neg Hx   . Stomach cancer Neg Hx   . Esophageal cancer Neg Hx     Social History   Socioeconomic History  . Marital status: Widowed    Spouse name: Not on file  . Number of children: Not on file  . Years of education: 69  . Highest education level: Some college, no degree  Occupational History  . Not on file  Tobacco Use  . Smoking status: Former Smoker    Packs/day: 0.25    Years: 45.00    Pack years: 11.25    Types: Cigarettes    Start date: 09/30/1963    Quit date: 08/15/2015    Years since quitting: 4.7  . Smokeless tobacco: Never Used  Vaping Use  . Vaping Use: Never used  Substance and Sexual Activity  . Alcohol use: No    Alcohol/week: 0.0 standard drinks  . Drug use: No  . Sexual activity: Not Currently    Birth control/protection: Post-menopausal  Other Topics Concern  . Not on file  Social History Narrative  . Not on file   Social Determinants of Health   Financial Resource Strain: Low Risk   . Difficulty of Paying Living Expenses: Not hard at all  Food Insecurity: No Food Insecurity  . Worried About Charity fundraiser in the Last Year: Never true  . Ran Out of Food in the Last Year: Never true  Transportation Needs: No Transportation Needs  . Lack of Transportation (Medical): No  . Lack of Transportation (Non-Medical): No  Physical Activity: Insufficiently Active  . Days of Exercise per Week: 3 days  . Minutes of Exercise per Session: 30 min  Stress: No Stress Concern Present   . Feeling of Stress : Not at all  Social Connections: Socially Isolated  . Frequency of Communication with Friends and Family: More than three times a week  . Frequency of Social Gatherings with Friends and Family: More than three times a week  . Attends Religious Services: Never  . Active Member of Clubs or Organizations: No  . Attends Archivist Meetings: Never  . Marital Status: Widowed  Intimate Partner Violence: Not At Risk  . Fear of Current or Ex-Partner: No  . Emotionally Abused: No  . Physically Abused: No  . Sexually Abused: No    Review of Systems: See HPI, otherwise negative ROS  Impression/Plan: Victoria Reyes is here  for an EGD due to history of gastric and duodenal ulcers, H pylori + and a colonoscopy to be performed for surveillance purposes due to history of adenomatous colon polyps.   The risks of the procedure including infection, bleed, or perforation as well as benefits, limitations, alternatives and imponderables have been reviewed with the patient. Questions have been answered. All parties agreeable.

## 2020-05-14 ENCOUNTER — Other Ambulatory Visit: Payer: Self-pay | Admitting: Physician Assistant

## 2020-05-14 LAB — SURGICAL PATHOLOGY

## 2020-05-15 ENCOUNTER — Encounter (HOSPITAL_COMMUNITY): Payer: Self-pay | Admitting: Internal Medicine

## 2020-06-17 ENCOUNTER — Ambulatory Visit (INDEPENDENT_AMBULATORY_CARE_PROVIDER_SITE_OTHER): Payer: Medicare HMO | Admitting: Internal Medicine

## 2020-06-17 ENCOUNTER — Encounter: Payer: Self-pay | Admitting: Internal Medicine

## 2020-06-17 ENCOUNTER — Other Ambulatory Visit: Payer: Self-pay

## 2020-06-17 VITALS — BP 132/81 | HR 81 | Temp 98.9°F | Resp 20 | Ht 61.5 in | Wt 211.1 lb

## 2020-06-17 DIAGNOSIS — K219 Gastro-esophageal reflux disease without esophagitis: Secondary | ICD-10-CM | POA: Diagnosis not present

## 2020-06-17 DIAGNOSIS — L659 Nonscarring hair loss, unspecified: Secondary | ICD-10-CM

## 2020-06-17 DIAGNOSIS — E038 Other specified hypothyroidism: Secondary | ICD-10-CM

## 2020-06-17 DIAGNOSIS — I10 Essential (primary) hypertension: Secondary | ICD-10-CM

## 2020-06-17 NOTE — Patient Instructions (Signed)
Please stop taking Minoxidil.  Please continue to take other medications as prescribed.  Please continue to follow low salt diet and perform moderate exercise as tolerated.  Pleas get fasting blood tests done before the next visit with Dr Moshe Cipro.

## 2020-06-17 NOTE — Assessment & Plan Note (Signed)
Lab Results  Component Value Date   TSH 0.26 (L) 02/07/2020   On Levothyroxine Needs to have TSH and free T4 checked as hair loss could be a sign of thyroid dysfunction

## 2020-06-17 NOTE — Assessment & Plan Note (Signed)
Continue Pantoprazole.

## 2020-06-17 NOTE — Assessment & Plan Note (Signed)
BP Readings from Last 1 Encounters:  06/17/20 132/81   Well-controlled with only diet and exercise Discontinued Minoxidil as patient was having headache from it and it did not help with hair loss Advised DASH diet and moderate exercise/walking, at least 150 mins/week

## 2020-06-17 NOTE — Progress Notes (Signed)
Established Patient Office Visit  Subjective:  Patient ID: Victoria Reyes, female    DOB: 1950/10/31  Age: 70 y.o. MRN: [id number]  CC:  Chief Complaint  Patient presents with  . Follow-up    6 week follow up bp evaluation also one side of her nose is stopped up     HPI Victoria Reyes is a 70 year old female with PMH of HTN, GERD and hypothyroidism who presents for follow up of HTN.  Her blood pressure was 132/81 in the office today.  She has stopped taking minoxidil as it was causing her to have headache.  Her amlodipine was discontinued as she was having hair loss with it.  She checks her BP at home and it has been ranging around 130s/70s.  She denies headache, dizziness, chest pain, dyspnea or palpitations.  She continues to have hair loss despite adjusting BP medications.  Her levothyroxine dose was adjusted recently, but she has not had follow-up thyroid function testing yet.  She denies any recent change in appetite or weight, skin or nail changes.  She states that she will get thyroid function test done before the next appointment.  Past Medical History:  Diagnosis Date  . Anemia    as a  young woman  . Diverticulitis 2005   CT   . DJD (degenerative joint disease)   . GERD (gastroesophageal reflux disease)   . H. pylori infection 01/2020   S/p treatment with omeprazole, amoxicillin, and Biaxin.  Marland Kitchen Headache(784.0)   . Hiatal hernia   . Hyperlipidemia   . Hypertension   . Hypothyroidism   . Knee pain, right   . Low back pain   . Neuropathy    peroneal nerve  . Obesity   . Pneumonia    as child  . S/P colonoscopy 2005   Dr. Tamala Julian: sigmoid diverticulosis  . Thyroid disease   . Tobacco abuse     Past Surgical History:  Procedure Laterality Date  . BACK SURGERY    . BIOPSY  01/29/2020   Procedure: BIOPSY;  Surgeon: Harvel Quale, MD;  [facility];  Service: Gastroenterology;;  . BIOPSY  05/13/2020   Procedure: BIOPSY;  Surgeon: Eloise Harman, DO;  Location: [facility] ENDOSCOPY;  Service: Endoscopy;;  . COLONOSCOPY  12/18/2010   Procedure: COLONOSCOPY;  Surgeon: Dorothyann Peng, MD; 3 mm sessile polyp at hepatic flexure, pancolonic diverticulosis, internal hemorrhoids, anal lesion just above the dentate line biopsied via cold forceps.  Colonic polyp was a tubular adenoma.  Anus biopsy with mild active inflammation associated with reactive epithelial changes and hemorrhage.  Recommended repeat colonoscopy in 2022.  Marland Kitchen COLONOSCOPY WITH PROPOFOL N/A 05/13/2020   Procedure: COLONOSCOPY WITH PROPOFOL;  Surgeon: Eloise Harman, DO;  Location: [facility] ENDOSCOPY;  Service: Endoscopy;  Laterality: N/A;  12:30PM  . cyst removal right wrist    . ESOPHAGOGASTRODUODENOSCOPY (EGD) WITH PROPOFOL N/A 01/29/2020   Procedure: ESOPHAGOGASTRODUODENOSCOPY (EGD) WITH PROPOFOL;  Surgeon: Montez Morita, Quillian Quince, MD;   3 gastric ulcers with clean base, 5 ulcers in the duodenal bulb and first portion of duodenum, 2 were noted to be oozing, treated with bipolar cautery.  Gastric biopsies consistent with H. pylori gastritis.  Marland Kitchen ESOPHAGOGASTRODUODENOSCOPY (EGD) WITH PROPOFOL N/A 05/13/2020   Procedure: ESOPHAGOGASTRODUODENOSCOPY (EGD) WITH PROPOFOL;  Surgeon: Eloise Harman, DO;  Location: [place];  Service: Endoscopy;  Laterality: N/A;  . EYE SURGERY Bilateral    cataract surgery with lens implant  . POLYPECTOMY  05/13/2020  Procedure: POLYPECTOMY;  Surgeon: Eloise Harman, DO;  Location: [facility]COPY;  Service: Endoscopy;;  . TONSILLECTOMY    . TOTAL SHOULDER ARTHROPLASTY Left 12/21/2016   Procedure: TOTAL SHOULDER ARTHROPLASTY;  Surgeon: Meredith Pel, MD;  Location: [place];  Service: Orthopedics;  Laterality: Left;  . TUBAL LIGATION    . umbillical hernia      Family History  Problem Relation Age of Onset  . Heart attack Sister        pacemaker  . Kidney failure Sister        on dialysis  . Colon cancer Neg Hx   . Stomach cancer Neg Hx   .  Esophageal cancer Neg Hx     Social History   Socioeconomic History  . Marital status: Widowed    Spouse name: Not on file  . Number of children: Not on file  . Years of education: 90  . Highest education level: Some college, no degree  Occupational History  . Not on file  Tobacco Use  . Smoking status: Former Smoker    Packs/day: 0.25    Years: 45.00    Pack years: 11.25    Types: Cigarettes    Start date: 09/30/1963    Quit date: 08/15/2015    Years since quitting: 4.8  . Smokeless tobacco: Never Used  Vaping Use  . Vaping Use: Never used  Substance and Sexual Activity  . Alcohol use: No    Alcohol/week: 0.0 standard drinks  . Drug use: No  . Sexual activity: Not Currently    Birth control/protection: Post-menopausal  Other Topics Concern  . Not on file  Social History Narrative  . Not on file   Social Determinants of Health   Financial Resource Strain: Low Risk   . Difficulty of Paying Living Expenses: Not hard at all  Food Insecurity: No Food Insecurity  . Worried About Charity fundraiser in the Last Year: Never true  . Ran Out of Food in the Last Year: Never true  Transportation Needs: No Transportation Needs  . Lack of Transportation (Medical): No  . Lack of Transportation (Non-Medical): No  Physical Activity: Insufficiently Active  . Days of Exercise per Week: 3 days  . Minutes of Exercise per Session: 30 min  Stress: No Stress Concern Present  . Feeling of Stress : Not at all  Social Connections: Socially Isolated  . Frequency of Communication with Friends and Family: More than three times a week  . Frequency of Social Gatherings with Friends and Family: More than three times a week  . Attends Religious Services: Never  . Active Member of Clubs or Organizations: No  . Attends Archivist Meetings: Never  . Marital Status: Widowed  Intimate Partner Violence: Not At Risk  . Fear of Current or Ex-Partner: No  . Emotionally Abused: No  .  Physically Abused: No  . Sexually Abused: No    Outpatient Medications Prior to Visit  Medication Sig Dispense Refill  . Cholecalciferol (VITAMIN D) 50 MCG (2000 UT) CAPS Take 2,000 Units by mouth daily at 12 noon.    . Ferrous Sulfate ([facility] IRON PO) Take 65 mg by mouth daily.    Marland Kitchen levothyroxine (SYNTHROID) 100 MCG tablet Take 100 mcg by mouth See admin instructions. Take 100 mg tablet by mouth every Monday, Wednesday, Friday and Sunday. Take 50 mg tablet every Tuesday, Thursday and Saturday 90 tablet 3  . omeprazole (PRILOSEC) 40 MG capsule Take 1 capsule (40 mg  total) by mouth daily. 30 capsule 5  . minoxidil (LONITEN) 2.5 MG tablet Take 1 tablet (2.5 mg total) by mouth daily. 30 tablet 3   No facility-administered medications prior to visit.    Allergies  Allergen Reactions  . Nsaids Other (See Comments)    Bleeding gastric and duodenal ulcers  . Hydrocodone Nausea Only    Can not take anything with " Done" on the end  . Sulfa Antibiotics Itching    ROS Review of Systems  Constitutional: Negative for chills and fever.  HENT: Negative for congestion, sinus pressure, sinus pain and sore throat.   Eyes: Negative for pain and discharge.  Respiratory: Negative for cough and shortness of breath.   Cardiovascular: Negative for chest pain and palpitations.  Gastrointestinal: Negative for abdominal pain, constipation, diarrhea, nausea and vomiting.  Endocrine: Negative for polydipsia and polyuria.  Genitourinary: Negative for dysuria and hematuria.  Musculoskeletal: Negative for neck pain and neck stiffness.  Skin: Negative for rash.       Hair loss  Neurological: Negative for dizziness and weakness.  Psychiatric/Behavioral: Negative for agitation and behavioral problems.      Objective:    Physical Exam Vitals reviewed.  Constitutional:      General: She is not in acute distress.    Appearance: She is not diaphoretic.  HENT:     Head: Normocephalic and atraumatic.      Nose: Nose normal. No congestion.     Mouth/Throat:     Mouth: Mucous membranes are moist.     Pharynx: No posterior oropharyngeal erythema.  Eyes:     General: No scleral icterus.    Extraocular Movements: Extraocular movements intact.     Pupils: Pupils are equal, round, and reactive to light.  Cardiovascular:     Rate and Rhythm: Normal rate and regular rhythm.     Pulses: Normal pulses.     Heart sounds: Normal heart sounds. No murmur heard.   Pulmonary:     Breath sounds: Normal breath sounds. No wheezing or rales.  Musculoskeletal:     Cervical back: Neck supple. No tenderness.     Right lower leg: No edema.     Left lower leg: No edema.  Skin:    General: Skin is warm.     Findings: No rash.  Neurological:     General: No focal deficit present.     Mental Status: She is alert and oriented to person, place, and time.  Psychiatric:        Mood and Affect: Mood normal.        Behavior: Behavior normal.     BP 132/81 (BP Location: Left Arm, Patient Position: Sitting, Cuff Size: Normal)   Pulse 81   Temp 98.9 F (37.2 C) (Oral)   Resp 20   Ht 5' 1.5" (1.562 m)   Wt 211 lb 1.9 oz (95.8 kg)   SpO2 97%   BMI 39.24 kg/m  Wt Readings from Last 3 Encounters:  06/17/20 211 lb 1.9 oz (95.8 kg)  05/13/20 207 lb (93.9 kg)  05/12/20 210 lb (95.3 kg)     There are no preventive care reminders to display for this patient.  There are no preventive care reminders to display for this patient.  Lab Results  Component Value Date   TSH 0.26 (L) 02/07/2020   Lab Results  Component Value Date   WBC 8.8 05/01/2020   HGB 12.8 05/01/2020   HCT 40.4 05/01/2020   MCV 86 05/01/2020  PLT 276 05/01/2020   Lab Results  Component Value Date   NA 141 02/07/2020   K 3.9 02/07/2020   CO2 23 02/07/2020   GLUCOSE 89 02/07/2020   BUN 9 02/07/2020   CREATININE 0.78 02/07/2020   BILITOT 0.4 02/07/2020   ALKPHOS 91 01/29/2020   AST 13 02/07/2020   ALT 8 02/07/2020   PROT 6.4  02/07/2020   ALBUMIN 3.5 01/29/2020   CALCIUM 9.0 02/07/2020   ANIONGAP 10 01/31/2020   Lab Results  Component Value Date   CHOL 188 02/07/2020   Lab Results  Component Value Date   HDL 63 02/07/2020   Lab Results  Component Value Date   LDLCALC 104 (H) 02/07/2020   Lab Results  Component Value Date   TRIG 114 02/07/2020   Lab Results  Component Value Date   CHOLHDL 3.0 02/07/2020   Lab Results  Component Value Date   HGBA1C 5.6 09/22/2017      Assessment & Plan:   Problem List Items Addressed This Visit      Cardiovascular and Mediastinum   HTN (hypertension) - Primary    BP Readings from Last 1 Encounters:  06/17/20 132/81   Well-controlled with only diet and exercise Discontinued Minoxidil as patient was having headache from it and it did not help with hair loss Advised DASH diet and moderate exercise/walking, at least 150 mins/week         Digestive   GERD (gastroesophageal reflux disease)    Continue Pantoprazole        Endocrine   Hypothyroidism    Lab Results  Component Value Date   TSH 0.26 (L) 02/07/2020   On Levothyroxine Needs to have TSH and free T4 checked as hair loss could be a sign of thyroid dysfunction      Relevant Orders   TSH + free T4    Hair loss Check TSH and free T4 Advised to take Biotin  No orders of the defined types were placed in this encounter.   Follow-up: Return if symptoms worsen or fail to improve.    Lindell Spar, MD

## 2020-07-18 ENCOUNTER — Other Ambulatory Visit: Payer: Self-pay | Admitting: Family Medicine

## 2020-08-07 DIAGNOSIS — E559 Vitamin D deficiency, unspecified: Secondary | ICD-10-CM | POA: Diagnosis not present

## 2020-08-07 DIAGNOSIS — E038 Other specified hypothyroidism: Secondary | ICD-10-CM | POA: Diagnosis not present

## 2020-08-07 DIAGNOSIS — I10 Essential (primary) hypertension: Secondary | ICD-10-CM | POA: Diagnosis not present

## 2020-08-08 LAB — CBC
Hematocrit: 37.5 % (ref 34.0–46.6)
Hemoglobin: 12.6 g/dL (ref 11.1–15.9)
MCH: 28.2 pg (ref 26.6–33.0)
MCHC: 33.6 g/dL (ref 31.5–35.7)
MCV: 84 fL (ref 79–97)
Platelets: 265 10*3/uL (ref 150–450)
RBC: 4.47 x10E6/uL (ref 3.77–5.28)
RDW: 14.4 % (ref 11.7–15.4)
WBC: 8.2 10*3/uL (ref 3.4–10.8)

## 2020-08-08 LAB — TSH: TSH: 7.4 u[IU]/mL — ABNORMAL HIGH (ref 0.450–4.500)

## 2020-08-08 LAB — VITAMIN D 25 HYDROXY (VIT D DEFICIENCY, FRACTURES): Vit D, 25-Hydroxy: 30.2 ng/mL (ref 30.0–100.0)

## 2020-08-11 ENCOUNTER — Encounter: Payer: Self-pay | Admitting: Family Medicine

## 2020-08-11 ENCOUNTER — Other Ambulatory Visit: Payer: Self-pay

## 2020-08-11 ENCOUNTER — Ambulatory Visit (INDEPENDENT_AMBULATORY_CARE_PROVIDER_SITE_OTHER): Payer: Medicare HMO | Admitting: Family Medicine

## 2020-08-11 VITALS — BP 159/72 | HR 77 | Resp 15 | Ht 62.0 in | Wt 213.0 lb

## 2020-08-11 DIAGNOSIS — I10 Essential (primary) hypertension: Secondary | ICD-10-CM

## 2020-08-11 DIAGNOSIS — E038 Other specified hypothyroidism: Secondary | ICD-10-CM | POA: Diagnosis not present

## 2020-08-11 MED ORDER — AMLODIPINE BESYLATE 5 MG PO TABS: 5.0000 mg | ORAL_TABLET | Freq: Every day | ORAL | 4 refills | Status: DC

## 2020-08-11 MED ORDER — LEVOTHYROXINE SODIUM 100 MCG PO TABS: 100.0000 ug | ORAL_TABLET | Freq: Every day | ORAL | 3 refills | Status: DC

## 2020-08-11 NOTE — Progress Notes (Signed)
   Victoria Reyes     MRN: [id number]      DOB: June 19, 1950   HPI Victoria Reyes is here for follow up and re-evaluation of chronic medical conditions, medication management and review of any available recent lab and radiology data.  Not taking BP medication, did not tolerate minoxidil Still has hair shedding  Feels cold often and battling weight  ROS Denies recent fever or chills. Denies sinus pressure, nasal congestion, ear pain or sore throat. Denies chest congestion, productive cough or wheezing. Denies chest pains, palpitations and leg swelling Denies abdominal pain, nausea, vomiting,diarrhea or constipation.   Denies dysuria, frequency, hesitancy or incontinence. Denies joint pain, swelling and limitation in mobility. Denies headaches, seizures, numbness, or tingling. Denies depression, anxiety or insomnia. Denies skin break down or rash.   PE  BP (!) 159/72   Pulse 77   Resp 15   Ht 5\' 2"  (1.575 m)   Wt 213 lb (96.6 kg)   SpO2 94%   BMI 38.96 kg/m   Patient alert and oriented and in no cardiopulmonary distress.  HEENT: No facial asymmetry, EOMI,     Neck supple .  Chest: Clear to auscultation bilaterally.  CVS: S1, S2 no murmurs, no S3.Regular rate.  ABD: Soft non tender.   Ext: No edema  MS: Adequate ROM spine, shoulders, hips and knees.  Skin: Intact, no ulcerations or rash noted.  Psych: Good eye contact, normal affect. Memory intact not anxious or depressed appearing.  CNS: CN 2-12 intact, power,  normal throughout.no focal deficits noted.   Assessment & Plan  HTN (hypertension) Uncontrolled start amlodipine 5 mg DASH diet and commitment to daily physical activity for a minimum of 30 minutes discussed and encouraged, as a part of hypertension management. The importance of attaining a healthy weight is also discussed.  BP/Weight 08/11/2020 06/17/2020 05/13/2020 05/12/2020 05/06/2020 05/01/2020 03/70/4888  Systolic BP 916 945 038 882 800 349 179  Diastolic  BP 72 81 68 94 88 70 67  Wt. (Lbs) 213 211.12 207 210 208.6 210 210.8  BMI 38.96 39.24 37.86 38.41 38.15 38.41 38.56       Hypothyroidism Increase to once daily dosing, undercorrected  Morbid obesity ([place])  Patient re-educated about  the importance of commitment to a  minimum of 150 minutes of exercise per week as able.  The importance of healthy food choices with portion control discussed, as well as eating regularly and within a 12 hour window most days. The need to choose "clean , green" food 50 to 75% of the time is discussed, as well as to make water the primary drink and set a goal of 64 ounces water daily.    Weight /BMI 08/11/2020 06/17/2020 05/13/2020  WEIGHT 213 lb 211 lb 1.9 oz 207 lb  HEIGHT 5\' 2"  5' 1.5" 5\' 2"   BMI 38.96 kg/m2 39.24 kg/m2 37.86 kg/m2

## 2020-08-11 NOTE — Assessment & Plan Note (Signed)
Uncontrolled start amlodipine 5 mg DASH diet and commitment to daily physical activity for a minimum of 30 minutes discussed and encouraged, as a part of hypertension management. The importance of attaining a healthy weight is also discussed.  BP/Weight 08/11/2020 06/17/2020 05/13/2020 05/12/2020 05/06/2020 05/01/2020 02/72/5366  Systolic BP 440 347 425 956 387 564 332  Diastolic BP 72 81 68 94 88 70 67  Wt. (Lbs) 213 211.12 207 210 208.6 210 210.8  BMI 38.96 39.24 37.86 38.41 38.15 38.41 38.56

## 2020-08-11 NOTE — Assessment & Plan Note (Signed)
  Patient re-educated about  the importance of commitment to a  minimum of 150 minutes of exercise per week as able.  The importance of healthy food choices with portion control discussed, as well as eating regularly and within a 12 hour window most days. The need to choose "clean , green" food 50 to 75% of the time is discussed, as well as to make water the primary drink and set a goal of 64 ounces water daily.    Weight /BMI 08/11/2020 06/17/2020 05/13/2020  WEIGHT 213 lb 211 lb 1.9 oz 207 lb  HEIGHT 5\' 2"  5' 1.5" 5\' 2"   BMI 38.96 kg/m2 39.24 kg/m2 37.86 kg/m2

## 2020-08-11 NOTE — Patient Instructions (Signed)
F/U in 3 months, re evaluate blood pressure and weight  Increase thyroid dose to 100 mcg every day  When thyroid is corrected this will help your hair, your energy level, may help with weight loss, and you will not feel as cold  Please stop chemicals in your hair, this will help to reduuce breaking  It is important that you exercise regularly at least 30 minutes 5 times a week. If you develop chest pain, have severe difficulty breathing, or feel very tired, stop exercising immediately and seek medical attention   Think about what you will eat, plan ahead. Choose " clean, green, fresh or frozen" over canned, processed or packaged foods which are more sugary, salty and fatty. 70 to 75% of food eaten should be vegetables and fruit. Three meals at set times with snacks allowed between meals, but they must be fruit or vegetables. Aim to eat over a 12 hour period , example 7 am to 7 pm, and STOP after  your last meal of the day. Drink water,generally about 64 ounces per day, no other drink is as healthy. Fruit juice is best enjoyed in a healthy way, by EATING the fruit.

## 2020-08-11 NOTE — Assessment & Plan Note (Signed)
Increase to once daily dosing, undercorrected

## 2020-10-10 ENCOUNTER — Other Ambulatory Visit: Payer: Self-pay | Admitting: Gastroenterology

## 2020-10-10 DIAGNOSIS — K269 Duodenal ulcer, unspecified as acute or chronic, without hemorrhage or perforation: Secondary | ICD-10-CM

## 2020-10-10 DIAGNOSIS — K253 Acute gastric ulcer without hemorrhage or perforation: Secondary | ICD-10-CM

## 2020-11-11 ENCOUNTER — Other Ambulatory Visit: Payer: Self-pay

## 2020-11-11 ENCOUNTER — Ambulatory Visit (INDEPENDENT_AMBULATORY_CARE_PROVIDER_SITE_OTHER): Payer: Medicare HMO | Admitting: Family Medicine

## 2020-11-11 ENCOUNTER — Other Ambulatory Visit (HOSPITAL_COMMUNITY): Payer: Self-pay | Admitting: Family Medicine

## 2020-11-11 ENCOUNTER — Encounter: Payer: Self-pay | Admitting: Family Medicine

## 2020-11-11 VITALS — BP 134/74 | HR 79 | Ht 62.0 in | Wt 215.0 lb

## 2020-11-11 DIAGNOSIS — E038 Other specified hypothyroidism: Secondary | ICD-10-CM

## 2020-11-11 DIAGNOSIS — D649 Anemia, unspecified: Secondary | ICD-10-CM

## 2020-11-11 DIAGNOSIS — L659 Nonscarring hair loss, unspecified: Secondary | ICD-10-CM

## 2020-11-11 DIAGNOSIS — R7302 Impaired glucose tolerance (oral): Secondary | ICD-10-CM

## 2020-11-11 DIAGNOSIS — E559 Vitamin D deficiency, unspecified: Secondary | ICD-10-CM | POA: Diagnosis not present

## 2020-11-11 DIAGNOSIS — Z1231 Encounter for screening mammogram for malignant neoplasm of breast: Secondary | ICD-10-CM | POA: Diagnosis not present

## 2020-11-11 DIAGNOSIS — I10 Essential (primary) hypertension: Secondary | ICD-10-CM

## 2020-11-11 DIAGNOSIS — M79605 Pain in left leg: Secondary | ICD-10-CM | POA: Diagnosis not present

## 2020-11-11 DIAGNOSIS — E785 Hyperlipidemia, unspecified: Secondary | ICD-10-CM | POA: Diagnosis not present

## 2020-11-11 MED ORDER — PREGABALIN 25 MG PO CAPS: 25.0000 mg | ORAL_CAPSULE | Freq: Two times a day (BID) | ORAL | 3 refills | Status: DC

## 2020-11-11 NOTE — Patient Instructions (Addendum)
Annual exam with MD mid December, call if you need me before   Please schedule mammogram  at checkout  Fasting cBC, lipid, cmp and EGFR, TSH, vit D this week  Please commit to a plant based diet, eatiing between 7am and 7 pm, and drink only water, no calories  Trial of lyrica for the nerve pain in your left leg one capsule twice daily  Tylenol ES one tablet twice daily may reduce the right knee pain, losing weight will help  Think about what you will eat, plan ahead. Choose " clean, green, fresh or frozen" over canned, processed or packaged foods which are more sugary, salty and fatty. 70 to 75% of food eaten should be vegetables and fruit. Three meals at set times with snacks allowed between meals, but they must be fruit or vegetables. Aim to eat over a 12 hour period , example 7 am to 7 pm, and STOP after  your last meal of the day. Drink water,generally about 64 ounces per day, no other drink is as healthy. Fruit juice is best enjoyed in a healthy way, by EATING the fruit.  Thanks for choosing [facility], we consider it a privelige to serve you.

## 2020-11-17 ENCOUNTER — Encounter: Payer: Self-pay | Admitting: Family Medicine

## 2020-11-17 DIAGNOSIS — M79605 Pain in left leg: Secondary | ICD-10-CM | POA: Insufficient documentation

## 2020-11-17 NOTE — Assessment & Plan Note (Signed)
Chronic burning pain in LLE, no benefit from gabapentin, trial of lyrica

## 2020-11-17 NOTE — Assessment & Plan Note (Signed)
  Patient re-educated about  the importance of commitment to a  minimum of 150 minutes of exercise per week as able.  The importance of healthy food choices with portion control discussed, as well as eating regularly and within a 12 hour window most days. The need to choose "clean , green" food 50 to 75% of the time is discussed, as well as to make water the primary drink and set a goal of 64 ounces water daily.    Weight /BMI 11/11/2020 08/11/2020 06/17/2020  WEIGHT 215 lb 213 lb 211 lb 1.9 oz  HEIGHT '5\' 2"'$  '5\' 2"'$  5' 1.5"  BMI 39.32 kg/m2 38.96 kg/m2 39.24 kg/m2

## 2020-11-17 NOTE — Assessment & Plan Note (Signed)
Hyperlipidemia:Low fat diet discussed and encouraged.   Lipid Panel  Lab Results  Component Value Date   CHOL 188 02/07/2020   HDL 63 02/07/2020   LDLCALC 104 (H) 02/07/2020   TRIG 114 02/07/2020   CHOLHDL 3.0 02/07/2020     Updated lab needed at/ before next visit.

## 2020-11-17 NOTE — Assessment & Plan Note (Signed)
Controlled, no change in medication DASH diet and commitment to daily physical activity for a minimum of 30 minutes discussed and encouraged, as a part of hypertension management. The importance of attaining a healthy weight is also discussed.  BP/Weight 11/11/2020 08/11/2020 06/17/2020 05/13/2020 05/12/2020 05/06/2020 123456  Systolic BP Q000111Q Q000111Q Q000111Q 0000000 [id number] 0000000 [id number]  Diastolic BP 74 72 81 68 94 88 70  Wt. (Lbs) 215 213 211.12 207 210 208.6 210  BMI 39.32 38.96 39.24 37.86 38.41 38.15 38.41

## 2020-11-17 NOTE — Assessment & Plan Note (Signed)
Controlled, no change in medication  

## 2020-11-17 NOTE — Progress Notes (Signed)
Victoria Reyes     MRN: [id number]      DOB: 1950-09-19   HPI Victoria Reyes is here for follow up and re-evaluation of chronic medical conditions, medication management and review of any available recent lab and radiology data.  Preventive health is updated, specifically  Cancer screening and Immunization.   Questions or concerns regarding consultations or procedures which the PT has had in the interim are  addressed. The PT denies any adverse reactions to current medications since the last visit.  C/o ongoing weight gain , wich makes her feel miserable C/o chronic burning pain in left lg   ROS Denies recent fever or chills. Denies sinus pressure, nasal congestion, ear pain or sore throat. Denies chest congestion, productive cough or wheezing. Denies chest pains, palpitations and leg swelling Denies abdominal pain, nausea, vomiting,diarrhea or constipation.   Denies dysuria, frequency, hesitancy or incontinence. Denies depression, anxiety or insomnia. Denies skin break down or rash.   PE  BP 134/74   Pulse 79   Ht '5\' 2"'$  (1.575 m)   Wt 215 lb (97.5 kg)   SpO2 94%   BMI 39.32 kg/m   Patient alert and oriented and in no cardiopulmonary distress.  HEENT: No facial asymmetry, EOMI,     Neck supple .  Chest: Clear to auscultation bilaterally.  CVS: S1, S2 no murmurs, no S3.Regular rate.  ABD: Soft non tender.   Ext: No edema Decreased ROM lumbar spine, adequate in shoulders, hips and knees.  Skin: Intact, no ulcerations or rash noted.  Psych: Good eye contact, normal affect. Memory intact not anxious or depressed appearing.  CNS: CN 2-12 intact, power,  normal throughout.no focal deficits noted.   Assessment & Plan  HTN (hypertension) Controlled, no change in medication DASH diet and commitment to daily physical activity for a minimum of 30 minutes discussed and encouraged, as a part of hypertension management. The importance of attaining a healthy weight is  also discussed.  BP/Weight 11/11/2020 08/11/2020 06/17/2020 05/13/2020 05/12/2020 05/06/2020 123456  Systolic BP Q000111Q Q000111Q Q000111Q 0000000 [id number] 0000000 [id number]  Diastolic BP 74 72 81 68 94 88 70  Wt. (Lbs) 215 213 211.12 207 210 208.6 210  BMI 39.32 38.96 39.24 37.86 38.41 38.15 38.41       Hypothyroidism Controlled, no change in medication   Morbid obesity ([place])  Patient re-educated about  the importance of commitment to a  minimum of 150 minutes of exercise per week as able.  The importance of healthy food choices with portion control discussed, as well as eating regularly and within a 12 hour window most days. The need to choose "clean , green" food 50 to 75% of the time is discussed, as well as to make water the primary drink and set a goal of 64 ounces water daily.    Weight /BMI 11/11/2020 08/11/2020 06/17/2020  WEIGHT 215 lb 213 lb 211 lb 1.9 oz  HEIGHT '5\' 2"'$  '5\' 2"'$  5' 1.5"  BMI 39.32 kg/m2 38.96 kg/m2 39.24 kg/m2      Hyperlipidemia with target LDL less than 100 Hyperlipidemia:Low fat diet discussed and encouraged.   Lipid Panel  Lab Results  Component Value Date   CHOL 188 02/07/2020   HDL 63 02/07/2020   LDLCALC 104 (H) 02/07/2020   TRIG 114 02/07/2020   CHOLHDL 3.0 02/07/2020     Updated lab needed at/ before next visit.   IGT (impaired glucose tolerance) Patient educated about the importance of limiting  Carbohydrate intake ,  the need to commit to daily physical activity for a minimum of 30 minutes , and to commit weight loss. The fact that changes in all these areas will reduce or eliminate all together the development of diabetes is stressed.   Diabetic Labs Latest Ref Rng & Units 02/07/2020 01/31/2020 01/30/2020 01/29/2020 01/28/2020  HbA1c <5.7 % of total Hgb - - - - -  Chol <200 mg/dL 188 - - - -  HDL > OR = 50 mg/dL 63 - - - -  Calc LDL mg/dL (calc) 104(H) - - - -  Triglycerides <150 mg/dL 114 - - - -  Creatinine 0.60 - 0.93 mg/dL 0.78 0.66 0.69 0.59 0.70   BP/Weight  11/11/2020 08/11/2020 06/17/2020 05/13/2020 05/12/2020 05/06/2020 123456  Systolic BP Q000111Q Q000111Q Q000111Q 0000000 A999333 0000000 [id number]  Diastolic BP 74 72 81 68 94 88 70  Wt. (Lbs) 215 213 211.12 207 210 208.6 210  BMI 39.32 38.96 39.24 37.86 38.41 38.15 38.41   No flowsheet data found.    Leg pain, left Chronic burning pain in LLE, no benefit from gabapentin, trial of lyrica

## 2020-11-17 NOTE — Assessment & Plan Note (Signed)
Patient educated about the importance of limiting  Carbohydrate intake , the need to commit to daily physical activity for a minimum of 30 minutes , and to commit weight loss. The fact that changes in all these areas will reduce or eliminate all together the development of diabetes is stressed.   Diabetic Labs Latest Ref Rng & Units 02/07/2020 01/31/2020 01/30/2020 01/29/2020 01/28/2020  HbA1c <5.7 % of total Hgb - - - - -  Chol <200 mg/dL 188 - - - -  HDL > OR = 50 mg/dL 63 - - - -  Calc LDL mg/dL (calc) 104(H) - - - -  Triglycerides <150 mg/dL 114 - - - -  Creatinine 0.60 - 0.93 mg/dL 0.78 0.66 0.69 0.59 0.70   BP/Weight 11/11/2020 08/11/2020 06/17/2020 05/13/2020 05/12/2020 05/06/2020 123456  Systolic BP Q000111Q Q000111Q Q000111Q 0000000 A999333 0000000 [id number]  Diastolic BP 74 72 81 68 94 88 70  Wt. (Lbs) 215 213 211.12 207 210 208.6 210  BMI 39.32 38.96 39.24 37.86 38.41 38.15 38.41   No flowsheet data found.

## 2020-12-31 ENCOUNTER — Other Ambulatory Visit: Payer: Self-pay | Admitting: Family Medicine

## 2021-01-07 ENCOUNTER — Other Ambulatory Visit: Payer: Self-pay

## 2021-01-07 ENCOUNTER — Ambulatory Visit (INDEPENDENT_AMBULATORY_CARE_PROVIDER_SITE_OTHER): Payer: Medicare HMO | Admitting: Family Medicine

## 2021-01-07 DIAGNOSIS — I10 Essential (primary) hypertension: Secondary | ICD-10-CM

## 2021-01-07 NOTE — Progress Notes (Signed)
   Unable to contact patient for visit, visit cancelled

## 2021-01-19 ENCOUNTER — Other Ambulatory Visit: Payer: Self-pay

## 2021-02-17 DIAGNOSIS — L650 Telogen effluvium: Secondary | ICD-10-CM | POA: Diagnosis not present

## 2021-02-18 DIAGNOSIS — I10 Essential (primary) hypertension: Secondary | ICD-10-CM | POA: Diagnosis not present

## 2021-02-18 DIAGNOSIS — E559 Vitamin D deficiency, unspecified: Secondary | ICD-10-CM | POA: Diagnosis not present

## 2021-02-18 DIAGNOSIS — L650 Telogen effluvium: Secondary | ICD-10-CM | POA: Diagnosis not present

## 2021-02-18 DIAGNOSIS — D649 Anemia, unspecified: Secondary | ICD-10-CM | POA: Diagnosis not present

## 2021-02-18 DIAGNOSIS — E785 Hyperlipidemia, unspecified: Secondary | ICD-10-CM | POA: Diagnosis not present

## 2021-02-18 DIAGNOSIS — E038 Other specified hypothyroidism: Secondary | ICD-10-CM | POA: Diagnosis not present

## 2021-02-19 LAB — CBC WITH DIFFERENTIAL/PLATELET
Basophils Absolute: 0.1 10*3/uL (ref 0.0–0.2)
Basos: 1 %
EOS (ABSOLUTE): 0.3 10*3/uL (ref 0.0–0.4)
Eos: 3 %
Hematocrit: 37.9 % (ref 34.0–46.6)
Hemoglobin: 12.3 g/dL (ref 11.1–15.9)
Immature Grans (Abs): 0 10*3/uL (ref 0.0–0.1)
Immature Granulocytes: 0 %
Lymphocytes Absolute: 4 10*3/uL — ABNORMAL HIGH (ref 0.7–3.1)
Lymphs: 43 %
MCH: 27.5 pg (ref 26.6–33.0)
MCHC: 32.5 g/dL (ref 31.5–35.7)
MCV: 85 fL (ref 79–97)
Monocytes Absolute: 0.7 10*3/uL (ref 0.1–0.9)
Monocytes: 8 %
Neutrophils Absolute: 4.1 10*3/uL (ref 1.4–7.0)
Neutrophils: 45 %
Platelets: 292 10*3/uL (ref 150–450)
RBC: 4.47 x10E6/uL (ref 3.77–5.28)
RDW: 13.9 % (ref 11.7–15.4)
WBC: 9.2 10*3/uL (ref 3.4–10.8)

## 2021-02-19 LAB — LIPID PANEL
Chol/HDL Ratio: 3.2 ratio (ref 0.0–4.4)
Cholesterol, Total: 183 mg/dL (ref 100–199)
HDL: 58 mg/dL (ref >39)
LDL Chol Calc (NIH): 104 mg/dL — ABNORMAL HIGH (ref 0–99)
Triglycerides: 120 mg/dL (ref 0–149)
VLDL Cholesterol Cal: 21 mg/dL (ref 5–40)

## 2021-02-19 LAB — CMP14+EGFR
ALT: 10 IU/L (ref 0–32)
AST: 16 IU/L (ref 0–40)
Albumin/Globulin Ratio: 1.8 (ref 1.2–2.2)
Albumin: 4 g/dL (ref 3.7–4.7)
Alkaline Phosphatase: 141 IU/L — ABNORMAL HIGH (ref 44–121)
BUN/Creatinine Ratio: 19 (ref 12–28)
BUN: 12 mg/dL (ref 8–27)
Bilirubin Total: 0.5 mg/dL (ref 0.0–1.2)
CO2: 22 mmol/L (ref 20–29)
Calcium: 9 mg/dL (ref 8.7–10.3)
Chloride: 107 mmol/L — ABNORMAL HIGH (ref 96–106)
Creatinine, Ser: 0.62 mg/dL (ref 0.57–1.00)
Globulin, Total: 2.2 g/dL (ref 1.5–4.5)
Glucose: 86 mg/dL (ref 70–99)
Potassium: 3.9 mmol/L (ref 3.5–5.2)
Sodium: 142 mmol/L (ref 134–144)
Total Protein: 6.2 g/dL (ref 6.0–8.5)
eGFR: 95 mL/min/{1.73_m2} (ref >59)

## 2021-02-19 LAB — VITAMIN D 25 HYDROXY (VIT D DEFICIENCY, FRACTURES): Vit D, 25-Hydroxy: 33.2 ng/mL (ref 30.0–100.0)

## 2021-02-19 LAB — TSH: TSH: 0.544 u[IU]/mL (ref 0.450–4.500)

## 2021-02-20 ENCOUNTER — Ambulatory Visit (HOSPITAL_COMMUNITY)
Admission: RE | Admit: 2021-02-20 | Discharge: 2021-02-20 | Disposition: A | Discharge status: Home / Self Care | Payer: Medicare HMO | Source: Ambulatory Visit | Attending: Family Medicine | Admitting: Family Medicine

## 2021-02-20 ENCOUNTER — Other Ambulatory Visit: Payer: Self-pay

## 2021-02-20 DIAGNOSIS — Z1231 Encounter for screening mammogram for malignant neoplasm of breast: Secondary | ICD-10-CM | POA: Diagnosis not present

## 2021-03-11 ENCOUNTER — Other Ambulatory Visit: Payer: Self-pay

## 2021-03-11 ENCOUNTER — Ambulatory Visit (INDEPENDENT_AMBULATORY_CARE_PROVIDER_SITE_OTHER): Payer: Medicare HMO

## 2021-03-11 DIAGNOSIS — Z0000 Encounter for general adult medical examination without abnormal findings: Secondary | ICD-10-CM | POA: Diagnosis not present

## 2021-03-11 NOTE — Patient Instructions (Addendum)
Victoria Reyes , Thank you for taking time to come for your Medicare Wellness Visit. I appreciate your ongoing commitment to your health goals. Please review the following plan we discussed and let me know if I can assist you in the future.   These are the goals we discussed:  Goals      Increase water intake     Reduce fat intake     Recommend decreasing the amount of fried/high fatty foods.      Weight (lb) < 200 lb (90.7 kg)     Would like to lose 50lbs         This is a list of the screening recommended for you and due dates:  Health Maintenance  Topic Date Due   COVID-19 Vaccine (1) Never done   Tetanus Vaccine  03/11/2021*   Zoster (Shingles) Vaccine (1 of 2) 06/09/2021*   Flu Shot  07/10/2021*   Pneumonia Vaccine (1 - PCV) 03/11/2022*   Mammogram  02/21/2023   Colon Cancer Screening  05/13/2030   DEXA scan (bone density measurement)  Completed   Hepatitis C Screening: USPSTF Recommendation to screen - Ages 14-79 yo.  Completed   HPV Vaccine  Aged Out  *Topic was postponed. The date shown is not the original due date.       Health Maintenance, Female Adopting a healthy lifestyle and getting preventive care are important in promoting health and wellness. Ask your health care provider about: The right schedule for you to have regular tests and exams. Things you can do on your own to prevent diseases and keep yourself healthy. What should I know about diet, weight, and exercise? Eat a healthy diet  Eat a diet that includes plenty of vegetables, fruits, low-fat dairy products, and lean protein. Do not eat a lot of foods that are high in solid fats, added sugars, or sodium. Maintain a healthy weight Body mass index (BMI) is used to identify weight problems. It estimates body fat based on height and weight. Your health care provider can help determine your BMI and help you achieve or maintain a healthy weight. Get regular exercise Get regular exercise. This is one of  the most important things you can do for your health. Most adults should: Exercise for at least 150 minutes each week. The exercise should increase your heart rate and make you sweat (moderate-intensity exercise). Do strengthening exercises at least twice a week. This is in addition to the moderate-intensity exercise. Spend less time sitting. Even light physical activity can be beneficial. Watch cholesterol and blood lipids Have your blood tested for lipids and cholesterol at 71 years of age, then have this test every 5 years. Have your cholesterol levels checked more often if: Your lipid or cholesterol levels are high. You are older than 71 years of age. You are at high risk for heart disease. What should I know about cancer screening? Depending on your health history and family history, you may need to have cancer screening at various ages. This may include screening for: Breast cancer. Cervical cancer. Colorectal cancer. Skin cancer. Lung cancer. What should I know about heart disease, diabetes, and high blood pressure? Blood pressure and heart disease High blood pressure causes heart disease and increases the risk of stroke. This is more likely to develop in people who have high blood pressure readings or are overweight. Have your blood pressure checked: Every 3-5 years if you are 58-28 years of age. Every year if you are 36 years old  or older. Diabetes Have regular diabetes screenings. This checks your fasting blood sugar level. Have the screening done: Once every three years after age 24 if you are at a normal weight and have a low risk for diabetes. More often and at a younger age if you are overweight or have a high risk for diabetes. What should I know about preventing infection? Hepatitis B If you have a higher risk for hepatitis B, you should be screened for this virus. Talk with your health care provider to find out if you are at risk for hepatitis B infection. Hepatitis  C Testing is recommended for: Everyone born from [birth year] through 1966. Anyone with known risk factors for hepatitis C. Sexually transmitted infections (STIs) Get screened for STIs, including gonorrhea and chlamydia, if: You are sexually active and are younger than 71 years of age. You are older than 72 years of age and your health care provider tells you that you are at risk for this type of infection. Your sexual activity has changed since you were last screened, and you are at increased risk for chlamydia or gonorrhea. Ask your health care provider if you are at risk. Ask your health care provider about whether you are at high risk for HIV. Your health care provider may recommend a prescription medicine to help prevent HIV infection. If you choose to take medicine to prevent HIV, you should first get tested for HIV. You should then be tested every 3 months for as long as you are taking the medicine. Pregnancy If you are about to stop having your period (premenopausal) and you may become pregnant, seek counseling before you get pregnant. Take 400 to 800 micrograms (mcg) of folic acid every day if you become pregnant. Ask for birth control (contraception) if you want to prevent pregnancy. Osteoporosis and menopause Osteoporosis is a disease in which the bones lose minerals and strength with aging. This can result in bone fractures. If you are 68 years old or older, or if you are at risk for osteoporosis and fractures, ask your health care provider if you should: Be screened for bone loss. Take a calcium or vitamin D supplement to lower your risk of fractures. Be given hormone replacement therapy (HRT) to treat symptoms of menopause. Follow these instructions at home: Alcohol use Do not drink alcohol if: Your health care provider tells you not to drink. You are pregnant, may be pregnant, or are planning to become pregnant. If you drink alcohol: Limit how much you have to: 0-1 drink a day. Know  how much alcohol is in your drink. In the [place] one drink equals one 12 oz bottle of beer (355 mL), one 5 oz glass of wine (148 mL), or one 1 oz glass of hard liquor (44 mL). Lifestyle Do not use any products that contain nicotine or tobacco. These products include cigarettes, chewing tobacco, and vaping devices, such as e-cigarettes. If you need help quitting, ask your health care provider. Do not use street drugs. Do not share needles. Ask your health care provider for help if you need support or information about quitting drugs. General instructions Schedule regular health, dental, and eye exams. Stay current with your vaccines. Tell your health care provider if: You often feel depressed. You have ever been abused or do not feel safe at home. Summary Adopting a healthy lifestyle and getting preventive care are important in promoting health and wellness. Follow your health care provider's instructions about healthy diet, exercising, and getting tested or  screened for diseases. Follow your health care provider's instructions on monitoring your cholesterol and blood pressure. This information is not intended to replace advice given to you by your health care provider. Make sure you discuss any questions you have with your health care provider. Document Revised: 08/18/2020 Document Reviewed: 08/18/2020 Elsevier Patient Education  [place].

## 2021-03-11 NOTE — Progress Notes (Signed)
I connected with  Victoria Reyes on 03/11/21 by a audio enabled telemedicine application and verified that I am speaking with the correct person using two identifiers.  Patient Location: Home  Provider Location: Office/Clinic  I discussed the limitations of evaluation and management by telemedicine. The patient expressed understanding and agreed to proceed.    Subjective:   Victoria Reyes is a 70 y.o. female who presents for Medicare Annual (Subsequent) preventive examination.  Review of Systems     Cardiac Risk Factors include: dyslipidemia;hypertension;smoking/ tobacco exposure;sedentary lifestyle;advanced age (>37men, >42 women);obesity (BMI >30kg/m2)     Objective:    There were no vitals filed for this visit. There is no height or weight on file to calculate BMI.  Advanced Directives 03/11/2021 05/13/2020 05/12/2020 01/29/2020 01/28/2020 01/17/2020 12/19/2017  Does Patient Have a Medical Advance Directive? No Yes Yes Yes Yes Yes No  Type of Advance Directive - Living will - Living will Living will Living will -  Does patient want to make changes to medical advance directive? - - No - Patient declined No - Patient declined - No - Patient declined Yes (ED - Information included in AVS)  Copy of [place] in Chart? - - - - - - -  Would patient like information on creating a medical advance directive? Yes (ED - Information included in AVS) - - No - Patient declined - Yes (MAU/Ambulatory/Procedural Areas - Information given) -    Current Medications (verified) Outpatient Encounter Medications as of 03/11/2021  Medication Sig   amLODipine (NORVASC) 5 MG tablet TAKE 1 TABLET (5 MG TOTAL) BY MOUTH DAILY.   Cholecalciferol (VITAMIN D) 50 MCG (2000 UT) CAPS Take 2,000 Units by mouth daily at 12 noon.   levothyroxine (SYNTHROID) 100 MCG tablet Take 1 tablet (100 mcg total) by mouth daily.   omeprazole (PRILOSEC) 40 MG capsule TAKE 1 CAPSULE (40 MG TOTAL) BY MOUTH 2  (TWO) TIMES DAILY BEFORE A MEAL.   pregabalin (LYRICA) 25 MG capsule Take 1 capsule (25 mg total) by mouth 2 (two) times daily.   No facility-administered encounter medications on file as of 03/11/2021.    Allergies (verified) Nsaids, Hydrocodone, and Sulfa antibiotics   History: Past Medical History:  Diagnosis Date   Anemia    as a  young woman   Diverticulitis 2005   CT    DJD (degenerative joint disease)    GERD (gastroesophageal reflux disease)    H. pylori infection 01/2020   S/p treatment with omeprazole, amoxicillin, and Biaxin.   Headache(784.0)    Hiatal hernia    Hyperlipidemia    Hypertension    Hypothyroidism    Knee pain, right    Low back pain    Neuropathy    peroneal nerve   Obesity    Pneumonia    as child   S/P colonoscopy 2005   Dr. Tamala Julian: sigmoid diverticulosis   Thyroid disease    Tobacco abuse    Past Surgical History:  Procedure Laterality Date   BACK SURGERY     BIOPSY  01/29/2020   Procedure: BIOPSY;  Surgeon: Harvel Quale, MD;  Location: [facility];  Service: Gastroenterology;;   BIOPSY  05/13/2020   Procedure: BIOPSY;  Surgeon: Eloise Harman, DO;  Location: [facility] ENDOSCOPY;  Service: Endoscopy;;   COLONOSCOPY  12/18/2010   Procedure: COLONOSCOPY;  Surgeon: Dorothyann Peng, MD; 3 mm sessile polyp at hepatic flexure, pancolonic diverticulosis, internal hemorrhoids, anal lesion just above the  dentate line biopsied via cold forceps.  Colonic polyp was a tubular adenoma.  Anus biopsy with mild active inflammation associated with reactive epithelial changes and hemorrhage.  Recommended repeat colonoscopy in 2022.   COLONOSCOPY WITH PROPOFOL N/A 05/13/2020   Procedure: COLONOSCOPY WITH PROPOFOL;  Surgeon: Eloise Harman, DO;  Location: [facility] ENDOSCOPY;  Service: Endoscopy;  Laterality: N/A;  12:30PM   cyst removal right wrist     ESOPHAGOGASTRODUODENOSCOPY (EGD) WITH PROPOFOL N/A 01/29/2020   Procedure: ESOPHAGOGASTRODUODENOSCOPY (EGD)  WITH PROPOFOL;  Surgeon: Montez Morita, Quillian Quince, MD;   3 gastric ulcers with clean base, 5 ulcers in the duodenal bulb and first portion of duodenum, 2 were noted to be oozing, treated with bipolar cautery.  Gastric biopsies consistent with H. pylori gastritis.   ESOPHAGOGASTRODUODENOSCOPY (EGD) WITH PROPOFOL N/A 05/13/2020   Procedure: ESOPHAGOGASTRODUODENOSCOPY (EGD) WITH PROPOFOL;  Surgeon: Eloise Harman, DO;  Location: [place];  Service: Endoscopy;  Laterality: N/A;   EYE SURGERY Bilateral    cataract surgery with lens implant   POLYPECTOMY  05/13/2020   Procedure: POLYPECTOMY;  Surgeon: Eloise Harman, DO;  Location: [place];  Service: Endoscopy;;   TONSILLECTOMY     TOTAL SHOULDER ARTHROPLASTY Left 12/21/2016   Procedure: TOTAL SHOULDER ARTHROPLASTY;  Surgeon: Meredith Pel, MD;  Location: [place];  Service: Orthopedics;  Laterality: Left;   TUBAL LIGATION     umbillical hernia     Family History  Problem Relation Age of Onset   Heart attack Sister        pacemaker   Kidney failure Sister        on dialysis   Colon cancer Neg Hx    Stomach cancer Neg Hx    Esophageal cancer Neg Hx    Social History   Socioeconomic History   Marital status: Widowed    Spouse name: Not on file   Number of children: Not on file   Years of education: 12   Highest education level: Some college, no degree  Occupational History   Not on file  Tobacco Use   Smoking status: Former    Packs/day: 0.25    Years: 45.00    Pack years: 11.25    Types: Cigarettes    Start date: 09/30/1963    Quit date: 08/15/2015    Years since quitting: 5.5   Smokeless tobacco: Never  Vaping Use   Vaping Use: Never used  Substance and Sexual Activity   Alcohol use: No    Alcohol/week: 0.0 standard drinks   Drug use: No   Sexual activity: Not Currently    Birth control/protection: Post-menopausal  Other Topics Concern   Not on file  Social History Narrative   Not on file   Social  Determinants of Health   Financial Resource Strain: Low Risk    Difficulty of Paying Living Expenses: Not hard at all  Food Insecurity: No Food Insecurity   Worried About Charity fundraiser in the Last Year: Never true   [place] in the Last Year: Never true  Transportation Needs: No Transportation Needs   Lack of Transportation (Medical): No   Lack of Transportation (Non-Medical): No  Physical Activity: Inactive   Days of Exercise per Week: 0 days   Minutes of Exercise per Session: 0 min  Stress: Not on file  Social Connections: Unknown   Frequency of Communication with Friends and Family: Three times a week   Frequency of Social Gatherings with Friends and Family: Three  times a week   Attends Religious Services: Never   Active Member of Clubs or Organizations: No   Attends Music therapist: Never   Marital Status: Not on file    Tobacco Counseling Counseling given: Not Answered   Clinical Intake:     Pain : No/denies pain     Nutritional Status: BMI > 30  Obese Diabetes: No  How often do you need to have someone help you when you read instructions, pamphlets, or other written materials from your doctor or pharmacy?: 1 - Never  Diabetic?no  Interpreter Needed?: No      Activities of Daily Living In your present state of health, do you have any difficulty performing the following activities: 03/11/2021 05/12/2020  Hearing? N N  Vision? N N  Difficulty concentrating or making decisions? N N  Walking or climbing stairs? Y N  Dressing or bathing? N N  Doing errands, shopping? N N  Preparing Food and eating ? N -  Using the Toilet? N -  In the past six months, have you accidently leaked urine? N -  Do you have problems with loss of bowel control? N -  Managing your Medications? N -  Managing your Finances? N -  Housekeeping or managing your Housekeeping? N -  Some recent data might be hidden    Patient Care Team: Fayrene Helper,  MD as PCP - General (Family Medicine) Eloise Harman, DO as Consulting Physician (Gastroenterology)  Indicate any recent Medical Services you may have received from other than [facility] providers in the past year (date may be approximate).     Assessment:   This is a routine wellness examination for Victoria Reyes.  Hearing/Vision screen No results found.  Dietary issues and exercise activities discussed: Current Exercise Habits: The patient does not participate in regular exercise at present, Exercise limited by: orthopedic condition(s);Other - see comments (chronic knee pain)   Goals Addressed             This Visit's Progress    Reduce fat intake   On track    Recommend decreasing the amount of fried/high fatty foods.        Depression Screen PHQ 2/9 Scores 03/11/2021 06/17/2020 03/11/2020 02/06/2020 01/17/2020 01/17/2020 10/11/2019  PHQ - 2 Score 0 0 0 0 0 0 0  PHQ- 9 Score - - - - - - -    Fall Risk Fall Risk  03/11/2021 11/11/2020 08/11/2020 06/17/2020 05/06/2020  Falls in the past year? 0 0 0 0 0  Number falls in past yr: 0 0 0 0 0  Injury with Fall? 0 0 0 0 0  Risk for fall due to : - - - No Fall Risks -  Follow up - - - Falls evaluation completed -  Comment - - - - -    FALL RISK PREVENTION PERTAINING TO THE HOME:  Any stairs in or around the home? No  If so, are there any without handrails? No  Home free of loose throw rugs in walkways, pet beds, electrical cords, etc? Yes  Adequate lighting in your home to reduce risk of falls? Yes   ASSISTIVE DEVICES UTILIZED TO PREVENT FALLS:  Life alert? No  Use of a cane, walker or w/c? Yes  Grab bars in the bathroom? No  Shower chair or bench in shower? No  Elevated toilet seat or a handicapped toilet? No     Cognitive Function:     6CIT Screen 03/11/2021 01/17/2020 12/22/2018  12/19/2017 10/18/2016  What Year? 0 points 0 points 0 points 0 points 0 points  What month? 0 points 0 points 0 points 0 points 0 points  What time? 0 points  0 points 0 points 0 points 0 points  Count back from 20 0 points 0 points 0 points 0 points 0 points  Months in reverse 0 points 0 points 0 points 0 points 0 points  Repeat phrase 0 points 0 points 0 points 0 points 0 points  Total Score 0 0 0 0 0    Immunizations There is no immunization history for the selected administration types on file for this patient.  TDAP status: Up to date  Flu Vaccine status: Declined, Education has been provided regarding the importance of this vaccine but patient still declined. Advised may receive this vaccine at local pharmacy or Health Dept. Aware to provide a copy of the vaccination record if obtained from local pharmacy or Health Dept. Verbalized acceptance and understanding.  Pneumococcal vaccine status: Declined,  Education has been provided regarding the importance of this vaccine but patient still declined. Advised may receive this vaccine at local pharmacy or Health Dept. Aware to provide a copy of the vaccination record if obtained from local pharmacy or Health Dept. Verbalized acceptance and understanding.   Covid-19 vaccine status: Completed vaccines  Qualifies for Shingles Vaccine? Yes   Zostavax completed No   Shingrix Completed?: No.    Education has been provided regarding the importance of this vaccine. Patient has been advised to call insurance company to determine out of pocket expense if they have not yet received this vaccine. Advised may also receive vaccine at local pharmacy or Health Dept. Verbalized acceptance and understanding.  Screening Tests Health Maintenance  Topic Date Due   COVID-19 Vaccine (1) Never done   TETANUS/TDAP  03/11/2021 (Originally [birth year]/1968)   Zoster Vaccines- Shingrix (1 of 2) 06/09/2021 (Originally 10/06/1999)   INFLUENZA VACCINE  07/10/2021 (Originally 11/10/2020)   Pneumonia Vaccine 38+ Years old (1 - PCV) 03/11/2022 (Originally 10/06/2014)   MAMMOGRAM  02/21/2023   COLONOSCOPY (Pts 45-70yrs Insurance  coverage will need to be confirmed)  05/13/2030   DEXA SCAN  Completed   Hepatitis C Screening  Completed   HPV VACCINES  Aged Out    Health Maintenance  Health Maintenance Due  Topic Date Due   COVID-19 Vaccine (1) Never done    Colorectal cancer screening: Type of screening: Colonoscopy. Completed  . Repeat every 10 years  Mammogram status: Completed yes. Repeat every year  Bone Density status: Completed yes. Results reflect: Bone density results: NORMAL. Repeat every 2 years.  Lung Cancer Screening: (Low Dose CT Chest recommended if Age 56-80 years, 30 pack-year currently smoking OR have quit w/in 15years.) does not qualify.   Lung Cancer Screening Referral: na  Additional Screening:  Hepatitis C Screening: does qualify; Completed yes  Vision Screening: Recommended annual ophthalmology exams for early detection of glaucoma and other disorders of the eye. Is the patient up to date with their annual eye exam?  Yes  Who is the provider or what is the name of the office in which the patient attends annual eye exams? [facility] If pt is not established with a provider, would they like to be referred to a provider to establish care? No .   Dental Screening: Recommended annual dental exams for proper oral hygiene  Community Resource Referral / Chronic Care Management: CRR required this visit?  No   CCM required this visit?  No      Plan:     I have personally reviewed and noted the following in the patient's chart:   Medical and social history Use of alcohol, tobacco or illicit drugs  Current medications and supplements including opioid prescriptions.  Functional ability and status Nutritional status Physical activity Advanced directives List of other physicians Hospitalizations, surgeries, and ER visits in previous 12 months Vitals Screenings to include cognitive, depression, and falls Referrals and appointments  In addition, I have reviewed and discussed  with patient certain preventive protocols, quality metrics, and best practice recommendations. A written personalized care plan for preventive services as well as general preventive health recommendations were provided to patient.     Kate Sable, LPN, LPN   08/81/1031   Nurse Notes:  Victoria Reyes , Thank you for taking time to come for your Medicare Wellness Visit. I appreciate your ongoing commitment to your health goals. Please review the following plan we discussed and let me know if I can assist you in the future.   These are the goals we discussed:  Goals      Increase water intake     Reduce fat intake     Recommend decreasing the amount of fried/high fatty foods.      Weight (lb) < 200 lb (90.7 kg)     Would like to lose 50lbs         This is a list of the screening recommended for you and due dates:  Health Maintenance  Topic Date Due   COVID-19 Vaccine (1) Never done   Tetanus Vaccine  03/11/2021*   Zoster (Shingles) Vaccine (1 of 2) 06/09/2021*   Flu Shot  07/10/2021*   Pneumonia Vaccine (1 - PCV) 03/11/2022*   Mammogram  02/21/2023   Colon Cancer Screening  05/13/2030   DEXA scan (bone density measurement)  Completed   Hepatitis C Screening: USPSTF Recommendation to screen - Ages 44-79 yo.  Completed   HPV Vaccine  Aged Out  *Topic was postponed. The date shown is not the original due date.

## 2021-03-24 ENCOUNTER — Ambulatory Visit (INDEPENDENT_AMBULATORY_CARE_PROVIDER_SITE_OTHER): Payer: Medicare HMO | Admitting: Family Medicine

## 2021-03-24 ENCOUNTER — Other Ambulatory Visit: Payer: Self-pay

## 2021-03-24 ENCOUNTER — Telehealth: Payer: Self-pay

## 2021-03-24 ENCOUNTER — Encounter: Payer: Self-pay | Admitting: Family Medicine

## 2021-03-24 VITALS — BP 148/80 | HR 84 | Resp 17 | Ht 61.5 in | Wt 214.1 lb

## 2021-03-24 DIAGNOSIS — I10 Essential (primary) hypertension: Secondary | ICD-10-CM | POA: Diagnosis not present

## 2021-03-24 DIAGNOSIS — Z0000 Encounter for general adult medical examination without abnormal findings: Secondary | ICD-10-CM

## 2021-03-24 MED ORDER — SPIRONOLACTONE 25 MG PO TABS
25.0000 mg | ORAL_TABLET | Freq: Every day | ORAL | 3 refills | Status: DC
Start: 2021-03-24 — End: 2021-06-19

## 2021-03-24 NOTE — Progress Notes (Signed)
° ° °  Victoria Reyes     MRN: [id number]      DOB: February 22, 1951  HPI:No other health concerns are expressed or addressed at the visit. C/O LEG SWELLING CHRONIC, LEFT AND HAIR LOSS Recent labs,  are reviewed. Immunization is reviewed , and  NEEDS COVID BOOSTER REFUSES FLU AND PNEUMONIA.   PE: BP (!) 148/80    Pulse 84    Resp 17    Ht 5' 1.5" (1.562 m)    Wt 214 lb 1.3 oz (97.1 kg)    SpO2 95%    BMI 39.80 kg/m   Pleasant  female, alert and oriented x 3, in no cardio-pulmonary distress. Afebrile. HEENT No facial trauma or asymetry. Sinuses non tender.  Extra occullar muscles intact.. External ears normal, . Neck: supple, no adenopathy,JVD or thyromegaly.No bruits.  Chest: Clear to ascultation bilaterally.No crackles or wheezes. Non tender to palpation  Cardiovascular system; Heart sounds normal,  S1 and  S2 ,no S3.  No murmur, or thrill. Apical beat not displaced Peripheral pulses normal.  Abdomen: Soft, non tender, no organomegaly or masses. No bruits. Bowel sounds normal. No guarding, tenderness or rebound.    Musculoskeletal exam: Full ROM of spine, hips , shoulders and reduced in knees, right greater than left  deformity ,swelling or crepitus noted. No muscle wasting or atrophy.  Left leg swelling, no edema  Neurologic: Cranial nerves 2 to 12 intact. Power, tone ,sensation and reflexes normal throughout. No disturbance in gait. No tremor.  Skin: Intact, no ulceration, erythema , scaling or rash noted. Pigmentation normal throughout Mild patchy alopecia  Psych; Normal mood and affect. Judgement and concentration normal   Assessment & Plan:  HTN (hypertension) Uncontrolled with chronic leg  Swelling, change to spironolactone, d/c amlodipine which may also be contributing to alopecia DASH diet and commitment to daily physical activity for a minimum of 30 minutes discussed and encouraged, as a part of hypertension management. The importance of attaining a  healthy weight is also discussed.  BP/Weight 03/24/2021 11/11/2020 08/11/2020 06/17/2020 05/13/2020 05/12/2020 07/19/8117  Systolic BP 147 829 562 130 865 784 696  Diastolic BP 80 74 72 81 68 94 88  Wt. (Lbs) 214.08 215 213 211.12 207 210 208.6  BMI 39.8 39.32 38.96 39.24 37.86 38.41 38.15       Annual physical exam Annual exam as documented. Counseling done  re healthy lifestyle involving commitment to 150 minutes exercise per week, heart healthy diet, and attaining healthy weight.The importance of adequate sleep also discussed. Regular seat belt use and home safety, is also discussed. Changes in health habits are decided on by the patient with goals and time frames  set for achieving them. Immunization and cancer screening needs are specifically addressed at this visit.

## 2021-03-24 NOTE — Patient Instructions (Addendum)
Follow-up in office to reevaluate blood pressure in 6 weeks call if you need me sooner.  Stop amlodipine effective Wednesday, December 14.  New for blood pressure is spironolactone 25 mg 1 daily.    Start committing to 64 ounces of water daily and stopping sodas.  Start committing to eating salad between 11 and 12 in the morning and your evening lunch/dinner meal as before 7 pm   It is important that you exercise regularly at least 30 minutes 5 times a week. If you develop chest pain, have severe difficulty breathing, or feel very tired, stop exercising immediately and seek medical attention    Thanks for choosing [facility], we consider it a privelige to serve you.

## 2021-03-24 NOTE — Telephone Encounter (Signed)
LVM to call back if she needed anything- was going to call back and let us know if the med sent this am was the one she couldn't tolerate before. It was not the same.

## 2021-03-24 NOTE — Telephone Encounter (Signed)
Patient called and said the blood pressure medicine is not the same has Dr Moshe Cipro wrote this morning, she at school and does not know the name, but it was not the same medicine as before.

## 2021-03-30 DIAGNOSIS — H5213 Myopia, bilateral: Secondary | ICD-10-CM | POA: Diagnosis not present

## 2021-03-30 NOTE — Assessment & Plan Note (Signed)
Uncontrolled with chronic leg  Swelling, change to spironolactone, d/c amlodipine which may also be contributing to alopecia DASH diet and commitment to daily physical activity for a minimum of 30 minutes discussed and encouraged, as a part of hypertension management. The importance of attaining a healthy weight is also discussed.  BP/Weight 03/24/2021 11/11/2020 08/11/2020 06/17/2020 05/13/2020 05/12/2020 2/48/1859  Systolic BP 093 112 162 446 950 722 575  Diastolic BP 80 74 72 81 68 94 88  Wt. (Lbs) 214.08 215 213 211.12 207 210 208.6  BMI 39.8 39.32 38.96 39.24 37.86 38.41 38.15

## 2021-03-30 NOTE — Assessment & Plan Note (Signed)

## 2021-05-20 ENCOUNTER — Telehealth: Payer: Self-pay | Admitting: Family Medicine

## 2021-05-20 NOTE — Telephone Encounter (Signed)
Please call the pt back she wants to speak with her nurse Velna Hatchet

## 2021-05-21 NOTE — Telephone Encounter (Signed)
Called patient and left message for them to return call at the office   

## 2021-06-19 ENCOUNTER — Other Ambulatory Visit: Payer: Self-pay | Admitting: Family Medicine

## 2021-07-10 ENCOUNTER — Telehealth: Payer: Self-pay | Admitting: Family Medicine

## 2021-07-10 NOTE — Telephone Encounter (Signed)
Patient called in requesting " INDIGO " med for dizziness ? ?Patient would like a call back  ?

## 2021-07-13 NOTE — Telephone Encounter (Signed)
Had vertigo Friday am and wants a refill of meclizine to have on hand if it happens again  ?

## 2021-07-14 ENCOUNTER — Other Ambulatory Visit: Payer: Self-pay

## 2021-07-14 DIAGNOSIS — M79674 Pain in right toe(s): Secondary | ICD-10-CM | POA: Diagnosis not present

## 2021-07-14 DIAGNOSIS — R42 Dizziness and giddiness: Secondary | ICD-10-CM

## 2021-07-14 DIAGNOSIS — L03031 Cellulitis of right toe: Secondary | ICD-10-CM | POA: Diagnosis not present

## 2021-07-14 MED ORDER — MECLIZINE HCL 25 MG PO TABS: 25.0000 mg | ORAL_TABLET | Freq: Three times a day (TID) | ORAL | 0 refills | Status: DC | PRN

## 2021-07-14 NOTE — Telephone Encounter (Signed)
Refilled

## 2021-07-18 ENCOUNTER — Other Ambulatory Visit: Payer: Self-pay | Admitting: Gastroenterology

## 2021-07-18 DIAGNOSIS — K269 Duodenal ulcer, unspecified as acute or chronic, without hemorrhage or perforation: Secondary | ICD-10-CM

## 2021-07-18 DIAGNOSIS — K253 Acute gastric ulcer without hemorrhage or perforation: Secondary | ICD-10-CM

## 2021-07-20 NOTE — Telephone Encounter (Signed)
Sending in limited 3 month supply of refills. Please let patient know she needs to schedule a follow-up for additional refills.  ?

## 2021-07-20 NOTE — Telephone Encounter (Signed)
Last ov 04/09/20 ?

## 2021-07-21 NOTE — Telephone Encounter (Signed)
Noted  

## 2021-07-30 DIAGNOSIS — L03031 Cellulitis of right toe: Secondary | ICD-10-CM | POA: Diagnosis not present

## 2021-07-30 DIAGNOSIS — M79676 Pain in unspecified toe(s): Secondary | ICD-10-CM | POA: Diagnosis not present

## 2021-08-19 ENCOUNTER — Encounter: Payer: Self-pay | Admitting: Family Medicine

## 2021-08-19 ENCOUNTER — Ambulatory Visit (INDEPENDENT_AMBULATORY_CARE_PROVIDER_SITE_OTHER): Payer: Medicare HMO | Admitting: Family Medicine

## 2021-08-19 VITALS — BP 135/80 | HR 86 | Ht 61.0 in | Wt 217.0 lb

## 2021-08-19 DIAGNOSIS — E785 Hyperlipidemia, unspecified: Secondary | ICD-10-CM

## 2021-08-19 DIAGNOSIS — L659 Nonscarring hair loss, unspecified: Secondary | ICD-10-CM

## 2021-08-19 DIAGNOSIS — E559 Vitamin D deficiency, unspecified: Secondary | ICD-10-CM

## 2021-08-19 DIAGNOSIS — Z1231 Encounter for screening mammogram for malignant neoplasm of breast: Secondary | ICD-10-CM | POA: Diagnosis not present

## 2021-08-19 DIAGNOSIS — R7302 Impaired glucose tolerance (oral): Secondary | ICD-10-CM

## 2021-08-19 DIAGNOSIS — I10 Essential (primary) hypertension: Secondary | ICD-10-CM | POA: Diagnosis not present

## 2021-08-19 DIAGNOSIS — E038 Other specified hypothyroidism: Secondary | ICD-10-CM | POA: Diagnosis not present

## 2021-08-19 DIAGNOSIS — B719 Cestode infection, unspecified: Secondary | ICD-10-CM | POA: Diagnosis not present

## 2021-08-19 DIAGNOSIS — A071 Giardiasis [lambliasis]: Secondary | ICD-10-CM

## 2021-08-19 NOTE — Progress Notes (Signed)
? ?Victoria Reyes     MRN: [id number]      DOB: [birth year] ? ? ?HPI ?Ms. Oki is here for follow up and re-evaluation of chronic medical conditions, medication management and review of any available recent lab and radiology data.  ?Preventive health is updated, specifically  Cancer screening and Immunization.   ?Questions or concerns regarding consultations or procedures which the PT has had in the interim are  addressed. ?The PT denies any adverse reactions to current medications since the last visit.  ?Has positive giarda and tape worm  diplydium  ?ROS ?Denies recent fever or chills. ?Denies sinus pressure, nasal congestion, ear pain or sore throat. ?Denies chest congestion, productive cough or wheezing. ?Denies chest pains, palpitations and leg swelling ?Denies abdominal pain, nausea, vomiting,diarrhea or constipation.   ?Denies dysuria, frequency, hesitancy or incontinence. ?C/o  joint pain,  and limitation in mobility.Reques for handicap sticker grnted  ?Denies headaches, seizures, numbness, or tingling. ?Denies depression, anxiety or insomnia. ?Denies skin break down or rash. ? ? ?PE ? ?BP 135/80   Pulse 86   Ht '5\' 1"'$  (1.549 m)   Wt 217 lb 0.6 oz (98.4 kg)   SpO2 92%   BMI 41.01 kg/m?  ? ?Patient alert and oriented and in no cardiopulmonary distress. ? ?HEENT: No facial asymmetry, EOMI,     Neck supple . ? ?Chest: Clear to auscultation bilaterally. ? ?CVS: S1, S2 no murmurs, no S3.Regular rate. ? ?ABD: Soft non tender.  ? ?Ext: No edema ? ?MS: decreased ROM spine, adequate in shoulders, hips and knees. ? ?Skin: Intact, no ulcerations or rash noted. ? ?Psych: Good eye contact, normal affect. Memory intact not anxious or depressed appearing. ? ?CNS: CN 2-12 intact, power,  normal throughout.no focal deficits noted. ? ? ?Assessment & Plan ? ?Giardiasis ?flagyll 500 mg twice daily for 1 week ? ?Tapeworm infection ?Paper reporting tapeworm infection from vet, difficult to treat, need to submit stool  sample for testing  ? ?Alopecia ?Ongoing concern led her to getting tested for worms through the Vet has positive report of worm infestation will treat and re test ? ?HTN (hypertension) ?DASH diet and commitment to daily physical activity for a minimum of 30 minutes discussed and encouraged, as a part of hypertension management. ?The importance of attaining a healthy weight is also discussed. ? ? ?  08/19/2021  ? 11:02 AM 03/24/2021  ?  9:42 AM 03/24/2021  ?  9:16 AM 11/11/2020  ?  9:07 AM 08/11/2020  ?  9:58 AM 06/17/2020  ? 10:51 AM 05/13/2020  ?  4:25 PM  ?BP/Weight  ?Systolic BP 409 811 914 782 159 132 153  ?Diastolic BP 80 80 73 74 72 81 68  ?Wt. (Lbs) 217.04  214.08 215 213 211.12   ?BMI 41.01 kg/m2  39.8 kg/m2 39.32 kg/m2 38.96 kg/m2 39.24 kg/m2   ? Controlled, no change in medication ? ? ? ? ? ?Morbid obesity ([place]) ? ?Patient re-educated about  the importance of commitment to a  minimum of 150 minutes of exercise per week as able. ? ?The importance of healthy food choices with portion control discussed, as well as eating regularly and within a 12 hour window most days. ?The need to choose "clean , green" food 50 to 75% of the time is discussed, as well as to make water the primary drink and set a goal of 64 ounces water daily. ? ?  ? ?  08/19/2021  ? 11:02 AM 03/24/2021  ?  9:16 AM 11/11/2020  ?  9:07 AM  ?Weight /BMI  ?Weight 217 lb 0.6 oz 214 lb 1.3 oz 215 lb  ?Height '5\' 1"'$  (1.549 m) 5' 1.5" (1.562 m) '5\' 2"'$  (1.575 m)  ?BMI 41.01 kg/m2 39.8 kg/m2 39.32 kg/m2  ? ? ? ? ?

## 2021-08-19 NOTE — Patient Instructions (Addendum)
F/u in 4 months, call if you need me sooner ? ?Handicap sticker provided ? ?Medication will be sent in later today to  treat the worms ? ?No chemical or heat to your hair and allow it to recover and it will stop breaking ? ?cBC, fasting lipid, cmp and EGFr, tSH and vit D 4 months before next appointment ? ?Please schedule nov mammogram at checkout ? ?Think about what you will eat, plan ahead. ?Choose " clean, green, fresh or frozen" over canned, processed or packaged foods which are more sugary, salty and fatty. ?70 to 75% of food eaten should be vegetables and fruit. ?Three meals at set times with snacks allowed between meals, but they must be fruit or vegetables. ?Aim to eat over a 12 hour period , example 7 am to 7 pm, and STOP after  your last meal of the day. ?Drink water,generally about 64 ounces per day, no other drink is as healthy. Fruit juice is best enjoyed in a healthy way, by EATING the fruit. ? Thanks for choosing [facility], we consider it a privelige to serve you. ? ?

## 2021-08-23 ENCOUNTER — Encounter: Payer: Self-pay | Admitting: Family Medicine

## 2021-08-23 ENCOUNTER — Telehealth: Payer: Self-pay | Admitting: Family Medicine

## 2021-08-23 DIAGNOSIS — A071 Giardiasis [lambliasis]: Secondary | ICD-10-CM | POA: Insufficient documentation

## 2021-08-23 DIAGNOSIS — B719 Cestode infection, unspecified: Secondary | ICD-10-CM | POA: Insufficient documentation

## 2021-08-23 MED ORDER — METRONIDAZOLE 500 MG PO TABS: 500.0000 mg | ORAL_TABLET | Freq: Two times a day (BID) | ORAL | 0 refills | Status: DC

## 2021-08-23 NOTE — Assessment & Plan Note (Signed)
flagyll 500 mg twice daily for 1 week ?

## 2021-08-23 NOTE — Assessment & Plan Note (Signed)
DASH diet and commitment to daily physical activity for a minimum of 30 minutes discussed and encouraged, as a part of hypertension management. ?The importance of attaining a healthy weight is also discussed. ? ? ?  08/19/2021  ? 11:02 AM 03/24/2021  ?  9:42 AM 03/24/2021  ?  9:16 AM 11/11/2020  ?  9:07 AM 08/11/2020  ?  9:58 AM 06/17/2020  ? 10:51 AM 05/13/2020  ?  4:25 PM  ?BP/Weight  ?Systolic BP 109 323 557 322 159 132 153  ?Diastolic BP 80 80 73 74 72 81 68  ?Wt. (Lbs) 217.04  214.08 215 213 211.12   ?BMI 41.01 kg/m2  39.8 kg/m2 39.32 kg/m2 38.96 kg/m2 39.24 kg/m2   ? Controlled, no change in medication ? ? ? ? ?

## 2021-08-23 NOTE — Assessment & Plan Note (Signed)
?  Patient re-educated about  the importance of commitment to a  minimum of 150 minutes of exercise per week as able. ? ?The importance of healthy food choices with portion control discussed, as well as eating regularly and within a 12 hour window most days. ?The need to choose "clean , green" food 50 to 75% of the time is discussed, as well as to make water the primary drink and set a goal of 64 ounces water daily. ? ?  ? ?  08/19/2021  ? 11:02 AM 03/24/2021  ?  9:16 AM 11/11/2020  ?  9:07 AM  ?Weight /BMI  ?Weight 217 lb 0.6 oz 214 lb 1.3 oz 215 lb  ?Height '5\' 1"'$  (1.549 m) 5' 1.5" (1.562 m) '5\' 2"'$  (1.575 m)  ?BMI 41.01 kg/m2 39.8 kg/m2 39.32 kg/m2  ? ? ? ?

## 2021-08-23 NOTE — Assessment & Plan Note (Signed)
Paper reporting tapeworm infection from vet, difficult to treat, need to submit stool sample for testing  ?

## 2021-08-23 NOTE — Assessment & Plan Note (Signed)
Ongoing concern led her to getting tested for worms through the Vet has positive report of worm infestation will treat and re test ?

## 2021-08-23 NOTE — Telephone Encounter (Signed)
Pls call and let pt know that I have prescribed metronidzole for her worm infestation.Sh needs to submit stool for testing in 2 months for O/P to see if she has ongoing infection9 not through the vet) ?

## 2021-08-24 NOTE — Telephone Encounter (Signed)
Patient aware.

## 2021-10-02 ENCOUNTER — Other Ambulatory Visit: Payer: Self-pay | Admitting: Family Medicine

## 2021-10-13 ENCOUNTER — Other Ambulatory Visit: Payer: Self-pay | Admitting: Family Medicine

## 2021-10-21 IMAGING — MR MR KNEE*R* W/O CM
7 series · 40 of 40 positions shown · non-contrast
Comparison: None.

CLINICAL DATA: Chronic knee pain for 1 year. Status post fall.
Constant swelling.

EXAM:
MRI OF THE RIGHT KNEE WITHOUT CONTRAST
TECHNIQUE: Multiplanar, multisequence MR imaging of the knee was performed. No
intravenous contrast was administered.

[Series 8: T2 fat-sat · axial · right · 4.0mm · 0.47mm/px · z∈[-67,+58]mm · 4 of 26 slices shown (1 of 3)]
[im 1/26]
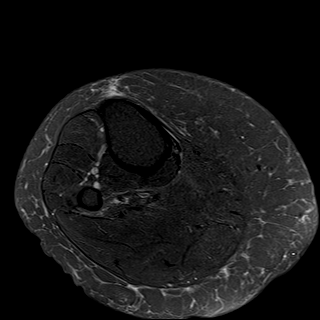
[im 9/26]
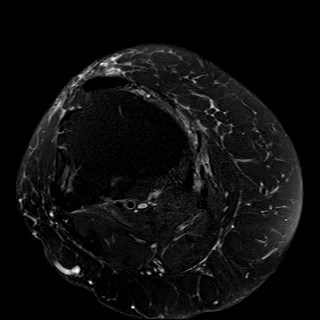
[im 17/26]
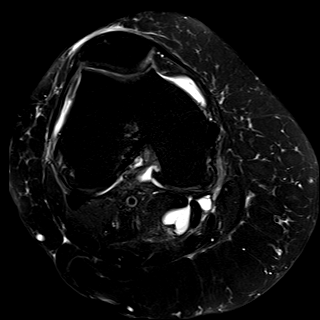
[im 26/26]
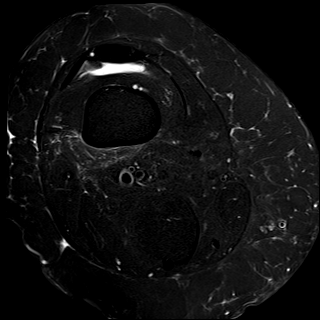

[Series 9: T1 · coronal · right · 4.0mm · 0.59mm/px · 6 of 28 slices shown]
[im 1/28]
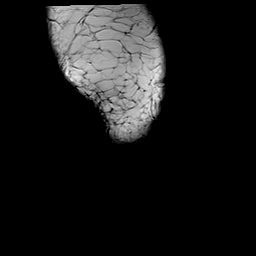
[im 6/28]
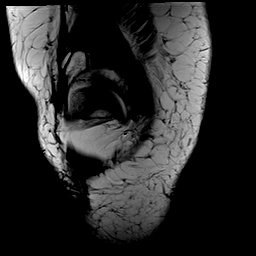
[im 11/28]
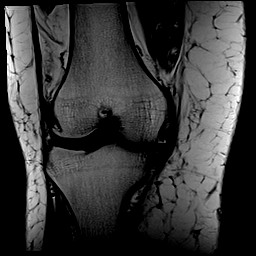
[im 17/28]
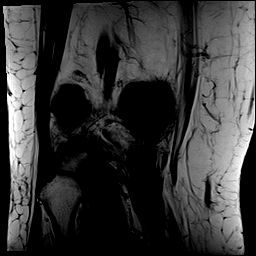
[im 22/28]
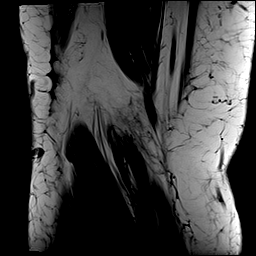
[im 28/28]
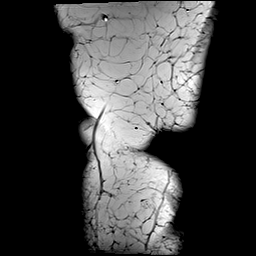

[Series 10: T2 fat-sat · coronal · right · 4.0mm · 0.59mm/px · 6 of 27 slices shown (2 of 3)]
[im 1/27]
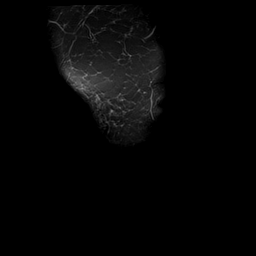
[im 6/27]
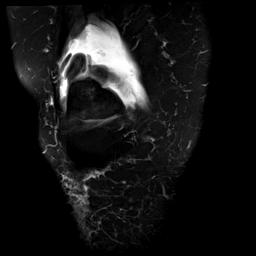
[im 11/27]
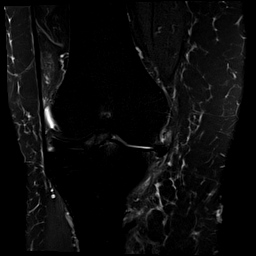
[im 16/27]
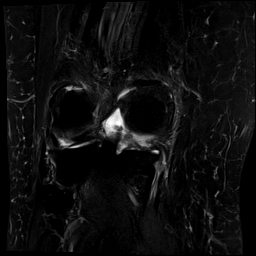
[im 21/27]
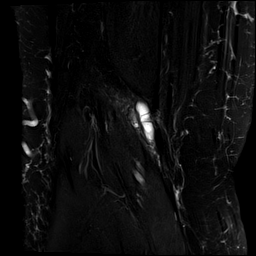
[im 27/27]
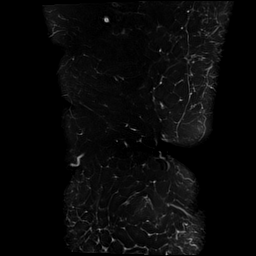

[Series 11: PD fat-sat · coronal · right · 3.0mm · 0.59mm/px · 8 of 38 slices shown (1 of 2)]
[im 1/38]
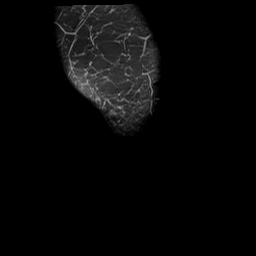
[im 6/38]
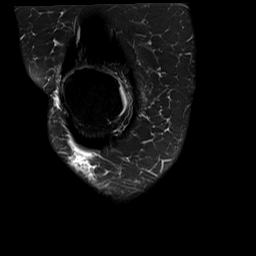
[im 11/38]
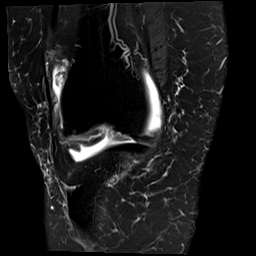
[im 16/38]
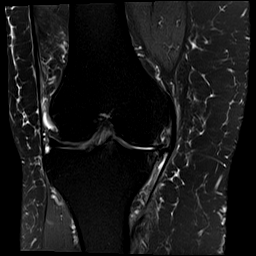
[im 22/38]
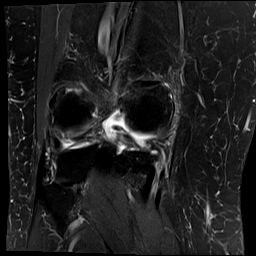
[im 27/38]
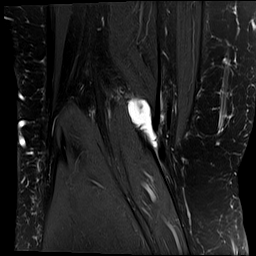
[im 32/38]
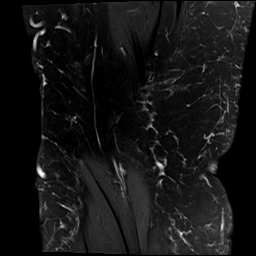
[im 38/38]
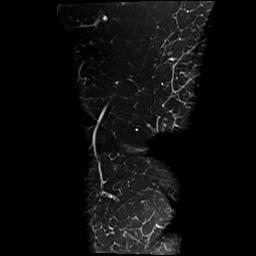

[Series 12: PD fat-sat · sagittal · right · 3.0mm · 0.59mm/px · 6 of 28 slices shown (2 of 2)]
[im 1/28]
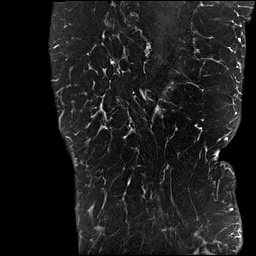
[im 6/28]
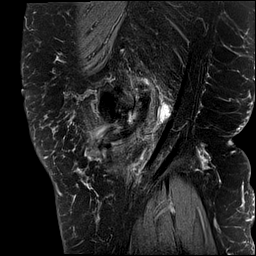
[im 11/28]
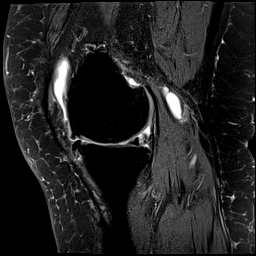
[im 17/28]
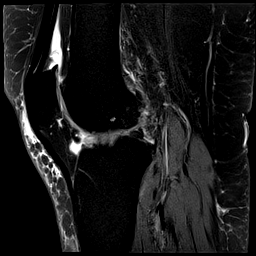
[im 22/28]
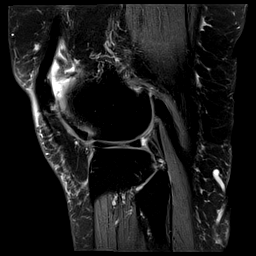
[im 28/28]
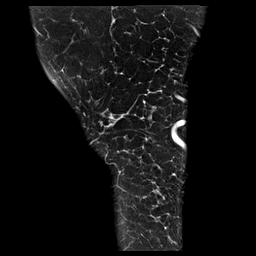

[Series 13: T2 fat-sat · sagittal · right · 3.0mm · 0.59mm/px · 6 of 28 slices shown (3 of 3)]
[im 1/28]
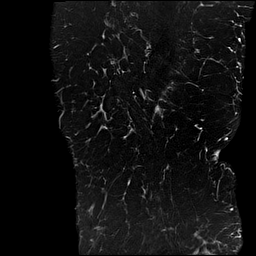
[im 6/28]
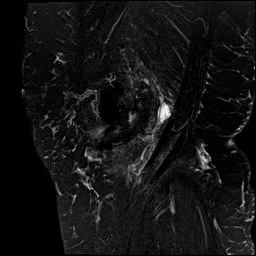
[im 11/28]
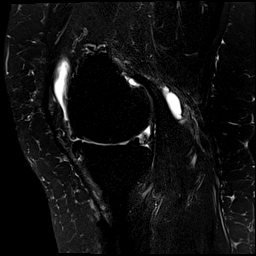
[im 17/28]
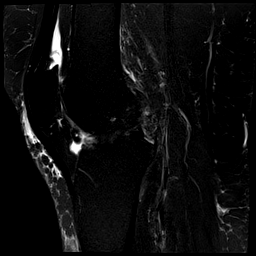
[im 22/28]
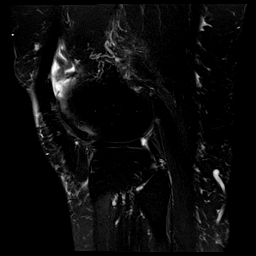
[im 28/28]
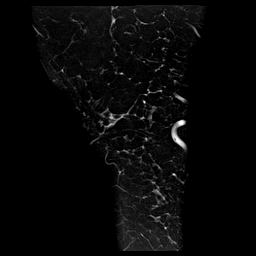

[Series 14: PD · coronal · right · 2.0mm · 0.47mm/px · 4 of 20 slices shown]
[im 1/20]
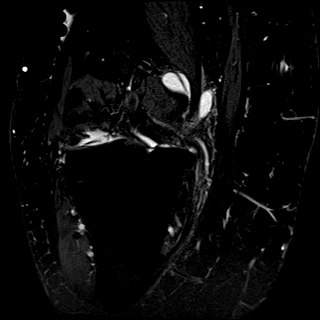
[im 7/20]
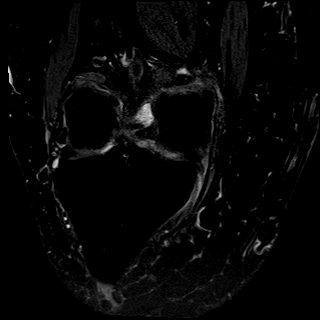
[im 13/20]
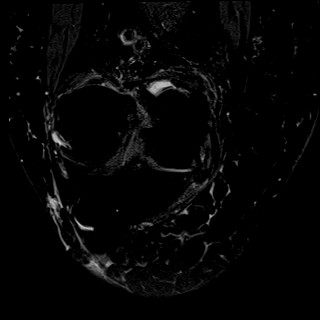
[im 20/20]
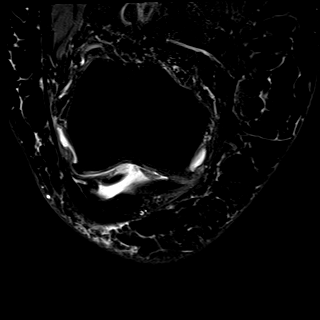

[40 of 40 positions shown; findings below may reference images not displayed]

FINDINGS: MENISCI

Medial: Small radial tear of the free edge of the posterior horn of
the medial meniscus. Complex tear of the body of the medial
meniscus.

Lateral: Degeneration of the lateral meniscus. Fraying along the
free edge of the body of the lateral meniscus.

LIGAMENTS

Cruciates: Intact PCL. ACL is intact. ACL is increased in signal and
mildly expanded as can be seen with mucinous degeneration.

Collaterals: Medial collateral ligament is intact. Lateral
collateral ligament complex is intact.

CARTILAGE

Patellofemoral: Partial-thickness cartilage loss of the
patellofemoral compartment.

Medial: High-grade partial-thickness cartilage loss with areas of
full-thickness cartilage loss of the medial femorotibial compartment
mild subchondral reactive marrow edema and marginal osteophytes.

Lateral: Partial thickness cartilage loss of the lateral
femorotibial compartment. Small marginal osteophytes.

JOINT: Small joint effusion. Normal [place]. No plical
thickening.

POPLITEAL FOSSA: Popliteus tendon is intact. Small Baker's cyst.

EXTENSOR MECHANISM: Intact quadriceps tendon. Intact patellar
tendon. Intact lateral patellar retinaculum. Intact medial patellar
retinaculum. Intact MPFL.

BONES: No aggressive osseous lesion. No fracture or dislocation.

Other: No fluid collection or hematoma. Muscles are normal.
IMPRESSION: 1. Small radial tear of the free edge of the posterior horn of the
medial meniscus. Complex tear of the body of the medial meniscus.
2. Degeneration of the lateral meniscus. Fraying along the free edge
of the body of the lateral meniscus.
3. Tricompartmental cartilage abnormalities as described above most
severe in the medial femorotibial compartment.

## 2021-11-30 ENCOUNTER — Ambulatory Visit: Payer: Medicare HMO | Admitting: Orthopedic Surgery

## 2021-11-30 ENCOUNTER — Ambulatory Visit (INDEPENDENT_AMBULATORY_CARE_PROVIDER_SITE_OTHER): Payer: Medicare HMO

## 2021-11-30 ENCOUNTER — Encounter: Payer: Self-pay | Admitting: Orthopedic Surgery

## 2021-11-30 DIAGNOSIS — M25561 Pain in right knee: Secondary | ICD-10-CM | POA: Diagnosis not present

## 2021-11-30 DIAGNOSIS — M19011 Primary osteoarthritis, right shoulder: Secondary | ICD-10-CM

## 2021-11-30 DIAGNOSIS — M1711 Unilateral primary osteoarthritis, right knee: Secondary | ICD-10-CM | POA: Diagnosis not present

## 2021-11-30 DIAGNOSIS — M25511 Pain in right shoulder: Secondary | ICD-10-CM

## 2021-11-30 NOTE — Progress Notes (Signed)
Office Visit Note   Patient: Victoria Reyes           Date of Birth: 21-Jun-1950           MRN: [id number] Visit Date: 11/30/2021 Requested by: Fayrene Helper, MD [address], [place],  [place] [address] PCP: Fayrene Helper, MD  Subjective: Chief Complaint  Patient presents with   Right Shoulder - Pain   Right Knee - Pain    HPI: Victoria Reyes is a 71 year old patient with 2 orthopedic problems today.  She reports right shoulder pain of 2 years duration.  Had a fall in her home about 2 years ago.  Slid from the den to the living room.  Had some bruising on the right leg and right arm at that time.  Now her main complaint is that she is unable to lay on the right-hand side which is her preferred side for sleeping.  The pain does wake her from sleep at night.  Denies any neck pain or radicular symptoms.  Reports some pain with range of motion but no loss of strength.  Patient also reports right knee pain.  Reports general pain with every step with less than 1 city block of walking endurance.  Describes swelling and stiffness and pain primarily on the medial side.  Reports some popping but no discrete locking symptoms.  She has had stomach problems in the past with nonsteroidals.              ROS: All systems reviewed are negative as they relate to the chief complaint within the history of present illness.  Patient denies  fevers or chills.   Assessment & Plan: Visit Diagnoses:  1. Right shoulder pain, unspecified chronicity   2. Right knee pain, unspecified chronicity   3. Primary osteoarthritis of right knee   4. Arthritis of right shoulder region     Plan: Impression is end-stage right knee arthritis.  Had a long talk today with the patient and her family about knee replacement and the indications for knee replacement.  In general Jakaya is doing well with knee replacement and really does not want to proceed until she becomes more symptomatic which is completely reasonable.   I think she may need knee replacement at sometime in the future but for now an injection is indicated to see if she can obtain some pain relief.  I did tell her that she would know when it is time for her to get knee replacement.  Injection performed today.  Regarding the right shoulder I think she may have AC joint arthritis.  Its probably the most likely pain generator in her shoulder at this time.  Rotator cuff strength is good and there is no limitation of passive external rotation.  I would recommend topical anti-inflammatory for the shoulder and if that does not work she should come back for repeat evaluation and possible ultrasound-guided injection into the [facility] joint.  Oral anti-inflammatories not indicated because of her previous history of GI symptoms  Follow-Up Instructions: Return if symptoms worsen or fail to improve.   Orders:  Orders Placed This Encounter  Procedures   XR KNEE 3 VIEW RIGHT   XR Shoulder Right   No orders of the defined types were placed in this encounter.     Procedures: No procedures performed   Clinical Data: No additional findings.  Objective: Vital Signs: There were no vitals taken for this visit.  Physical Exam:   Constitutional: Patient  appears well-developed HEENT:  Head: Normocephalic Eyes:EOM are normal Neck: Normal range of motion Cardiovascular: Normal rate Pulmonary/chest: Effort normal Neurologic: Patient is alert Skin: Skin is warm Psychiatric: Patient has normal mood and affect   Ortho Exam: Ortho exam demonstrates antalgic gait to the right.  Slight varus alignment in that right lower extremity.  Pedal pulses palpable bilaterally.  No masses lymphadenopathy or skin changes noted in the right or left knee region.  No real effusion in either knee.  Range of motion is 0-1 10 bilaterally.  Collateral and cruciate ligaments are stable.  Patellofemoral crepitus more prominent on the right than the left.  Ortho exam demonstrates good  cervical spine range of motion.  Passive range of motion of the shoulder bilaterally is 60/100/165.  Rotator cuff strength on the right is intact infraspinatus supraspinatus and subscap muscle testing.  Negative O'Brien's testing negative speeds testing.  Patient does have some pain with crossarm adduction as well as asymmetric AC joint tenderness right versus left  Specialty Comments:  No specialty comments available.  Imaging: XR KNEE 3 VIEW RIGHT  Result Date: 11/30/2021 [facility] lateral merchant radiographs right knee reviewed.  End-stage tricompartmental arthritis is present worse on the medial side with bone-on-bone changes and spurring.  Also moderately severe in the patellofemoral compartment and moderate late arthritic in the lateral compartment with spur formation noted.  Varus alignment present.  No acute fracture  XR Shoulder Right  Result Date: 11/30/2021 [facility] axillary outlet radiographs right shoulder reviewed.  No acute fracture.  Shoulder is located.  Acromiohumeral distance maintained.  Moderate to severe AC joint arthritis is present with superior spurring.  Mild glenohumeral joint space narrowing is present with no osteophytes.  Visualized lung fields clear.    PMFS History: Patient Active Problem List   Diagnosis Date Noted   Giardiasis 08/23/2021   Tapeworm infection 08/23/2021   Leg pain, left 11/17/2020   Alopecia 59/56/3875   History of Helicobacter pylori infection 04/09/2020   Anemia 04/09/2020   History of adenomatous polyp of colon 04/09/2020   Gastric ulcer 01/29/2020   Duodenal ulcer 01/29/2020   Melena    Knee pain, right 11/08/2019   Sacro ilial pain 11/08/2019   Chronic back pain 09/11/2019   Immunization refused 03/11/2019   Cutaneous wart 09/30/2014   HTN (hypertension) 01/01/2013   Hyperlipidemia with target LDL less than 100 05/04/2012   Annual physical exam 08/25/2011   IGT (impaired glucose tolerance) 03/14/2011   GERD (gastroesophageal reflux  disease) 03/14/2011   Chronic right-sided low back pain with right-sided sciatica 06/25/2009   Morbid obesity ([place]) 02/17/2007   Hypothyroidism 06/14/2006   DIVERTICULOSIS, COLON 03/01/2006   HIATAL HERNIA WITH REFLUX, HX OF 03/01/2006   Past Medical History:  Diagnosis Date   Anemia    as a  young woman   Diverticulitis 2005   CT    DJD (degenerative joint disease)    GERD (gastroesophageal reflux disease)    H. pylori infection 01/2020   S/p treatment with omeprazole, amoxicillin, and Biaxin.   Headache(784.0)    Hiatal hernia    Hyperlipidemia    Hypertension    Hypothyroidism    Knee pain, right    Low back pain    Neuropathy    peroneal nerve   Obesity    Pneumonia    as child   S/P colonoscopy 2005   Dr. Tamala Julian: sigmoid diverticulosis   Thyroid disease    Tobacco abuse     Family History  Problem Relation Age of Onset   Heart attack Sister        pacemaker   Kidney failure Sister        on dialysis   Colon cancer Neg Hx    Stomach cancer Neg Hx    Esophageal cancer Neg Hx     Past Surgical History:  Procedure Laterality Date   BACK SURGERY     BIOPSY  01/29/2020   Procedure: BIOPSY;  Surgeon: Harvel Quale, MD;  Location[facility];  Service: Gastroenterology;;   BIOPSY  05/13/2020   Procedure: BIOPSY;  Surgeon: Eloise Harman, DO;  [facility] ENDOSCOPY;  Service: Endoscopy;;   COLONOSCOPY  12/18/2010   Procedure: COLONOSCOPY;  Surgeon: Dorothyann Peng, MD; 3 mm sessile polyp at hepatic flexure, pancolonic diverticulosis, internal hemorrhoids, anal lesion just above the dentate line biopsied via cold forceps.  Colonic polyp was a tubular adenoma.  Anus biopsy with mild active inflammation associated with reactive epithelial changes and hemorrhage.  Recommended repeat colonoscopy in 2022.   COLONOSCOPY WITH PROPOFOL N/A 05/13/2020   Procedure: COLONOSCOPY WITH PROPOFOL;  Surgeon: Eloise Harman, DO;  Location[facility] ENDOSCOPY;  Service:  Endoscopy;  Laterality: N/A;  12:30PM   cyst removal right wrist     ESOPHAGOGASTRODUODENOSCOPY (EGD) WITH PROPOFOL N/A 01/29/2020   Procedure: ESOPHAGOGASTRODUODENOSCOPY (EGD) WITH PROPOFOL;  Surgeon: Montez Morita, Quillian Quince, MD;   3 gastric ulcers with clean base, 5 ulcers in the duodenal bulb and first portion of duodenum, 2 were noted to be oozing, treated with bipolar cautery.  Gastric biopsies consistent with H. pylori gastritis.   ESOPHAGOGASTRODUODENOSCOPY (EGD) WITH PROPOFOL N/A 05/13/2020   Procedure: ESOPHAGOGASTRODUODENOSCOPY (EGD) WITH PROPOFOL;  Surgeon: Eloise Harman, DO;  Location: [place];  Service: Endoscopy;  Laterality: N/A;   EYE SURGERY Bilateral    cataract surgery with lens implant   POLYPECTOMY  05/13/2020   Procedure: POLYPECTOMY;  Surgeon: Eloise Harman, DO;  Location: [place];  Service: Endoscopy;;   TONSILLECTOMY     TOTAL SHOULDER ARTHROPLASTY Left 12/21/2016   Procedure: TOTAL SHOULDER ARTHROPLASTY;  Surgeon: Meredith Pel, MD;  Location: [place];  Service: Orthopedics;  Laterality: Left;   TUBAL LIGATION     umbillical hernia     Social History   Occupational History   Not on file  Tobacco Use   Smoking status: Former    Packs/day: 0.25    Years: 45.00    Total pack years: 11.25    Types: Cigarettes    Start date: 09/30/1963    Quit date: 08/15/2015    Years since quitting: 6.2   Smokeless tobacco: Never  Vaping Use   Vaping Use: Never used  Substance and Sexual Activity   Alcohol use: No    Alcohol/week: 0.0 standard drinks of alcohol   Drug use: No   Sexual activity: Not Currently    Birth control/protection: Post-menopausal

## 2021-12-17 DIAGNOSIS — I10 Essential (primary) hypertension: Secondary | ICD-10-CM | POA: Diagnosis not present

## 2021-12-17 DIAGNOSIS — E785 Hyperlipidemia, unspecified: Secondary | ICD-10-CM | POA: Diagnosis not present

## 2021-12-17 DIAGNOSIS — E559 Vitamin D deficiency, unspecified: Secondary | ICD-10-CM | POA: Diagnosis not present

## 2021-12-18 LAB — CBC
Hematocrit: 39.1 % (ref 34.0–46.6)
Hemoglobin: 12.7 g/dL (ref 11.1–15.9)
MCH: 28 pg (ref 26.6–33.0)
MCHC: 32.5 g/dL (ref 31.5–35.7)
MCV: 86 fL (ref 79–97)
Platelets: 269 10*3/uL (ref 150–450)
RBC: 4.53 x10E6/uL (ref 3.77–5.28)
RDW: 13.3 % (ref 11.7–15.4)
WBC: 8.9 10*3/uL (ref 3.4–10.8)

## 2021-12-18 LAB — CMP14+EGFR
ALT: 7 IU/L (ref 0–32)
AST: 13 IU/L (ref 0–40)
Albumin/Globulin Ratio: 2.1 (ref 1.2–2.2)
Albumin: 4.2 g/dL (ref 3.8–4.8)
Alkaline Phosphatase: 132 IU/L — ABNORMAL HIGH (ref 44–121)
BUN/Creatinine Ratio: 14 (ref 12–28)
BUN: 10 mg/dL (ref 8–27)
Bilirubin Total: 0.6 mg/dL (ref 0.0–1.2)
CO2: 21 mmol/L (ref 20–29)
Calcium: 9.1 mg/dL (ref 8.7–10.3)
Chloride: 107 mmol/L — ABNORMAL HIGH (ref 96–106)
Creatinine, Ser: 0.73 mg/dL (ref 0.57–1.00)
Globulin, Total: 2 g/dL (ref 1.5–4.5)
Glucose: 89 mg/dL (ref 70–99)
Potassium: 4 mmol/L (ref 3.5–5.2)
Sodium: 142 mmol/L (ref 134–144)
Total Protein: 6.2 g/dL (ref 6.0–8.5)
eGFR: 87 mL/min/{1.73_m2} (ref >59)

## 2021-12-18 LAB — TSH: TSH: 0.94 u[IU]/mL (ref 0.450–4.500)

## 2021-12-18 LAB — LIPID PANEL
Chol/HDL Ratio: 3.1 ratio (ref 0.0–4.4)
Cholesterol, Total: 178 mg/dL (ref 100–199)
HDL: 58 mg/dL (ref >39)
LDL Chol Calc (NIH): 100 mg/dL — ABNORMAL HIGH (ref 0–99)
Triglycerides: 110 mg/dL (ref 0–149)
VLDL Cholesterol Cal: 20 mg/dL (ref 5–40)

## 2021-12-18 LAB — VITAMIN D 25 HYDROXY (VIT D DEFICIENCY, FRACTURES): Vit D, 25-Hydroxy: 33.8 ng/mL (ref 30.0–100.0)

## 2021-12-23 ENCOUNTER — Encounter: Payer: Self-pay | Admitting: Family Medicine

## 2021-12-23 ENCOUNTER — Ambulatory Visit (INDEPENDENT_AMBULATORY_CARE_PROVIDER_SITE_OTHER): Payer: Medicare HMO | Admitting: Family Medicine

## 2021-12-23 VITALS — BP 131/66 | HR 78 | Resp 16 | Ht 62.5 in | Wt 218.0 lb

## 2021-12-23 DIAGNOSIS — Z1231 Encounter for screening mammogram for malignant neoplasm of breast: Secondary | ICD-10-CM | POA: Diagnosis not present

## 2021-12-23 DIAGNOSIS — K219 Gastro-esophageal reflux disease without esophagitis: Secondary | ICD-10-CM

## 2021-12-23 DIAGNOSIS — I10 Essential (primary) hypertension: Secondary | ICD-10-CM

## 2021-12-23 DIAGNOSIS — E038 Other specified hypothyroidism: Secondary | ICD-10-CM | POA: Diagnosis not present

## 2021-12-23 MED ORDER — OMEPRAZOLE 40 MG PO CPDR: 40.0000 mg | DELAYED_RELEASE_CAPSULE | Freq: Every day | ORAL | 1 refills | Status: DC

## 2021-12-23 MED ORDER — WEGOVY 0.25 MG/0.5ML ~~LOC~~ SOAJ: 0.2500 mg | SUBCUTANEOUS | 1 refills | Status: DC

## 2021-12-23 NOTE — Progress Notes (Signed)
Victoria Reyes     MRN: [id number]      DOB: 14-May-1950   HPI Ms. Gales is here for follow up and re-evaluation of chronic medical conditions, medication management and review of any available recent lab and radiology data.  Preventive health is updated, specifically  Cancer screening and Immunization.   Questions or concerns regarding consultations or procedures which the PT has had in the interim are  addressed. The PT denies any adverse reactions to current medications since the last visit.  C/o ongoing weight gain and wants medication to help with this  ROS Denies recent fever or chills. Denies sinus pressure, nasal congestion, ear pain or sore throat. Denies chest congestion, productive cough or wheezing. Denies chest pains, palpitations and leg swelling Denies abdominal pain, nausea, vomiting,diarrhea or constipation.   Denies dysuria, frequency, hesitancy or incontinence. Denies joint pain, swelling and limitation in mobility. Denies headaches, seizures, numbness, or tingling. Denies depression, anxiety or insomnia. Denies skin break down or rash.   PE  BP 131/66   Pulse 78   Resp 16   Ht 5' 2.5" (1.588 m)   Wt 218 lb (98.9 kg)   SpO2 94%   BMI 39.24 kg/m   Patient alert and oriented and in no cardiopulmonary distress.  HEENT: No facial asymmetry, EOMI,     Neck supple .  Chest: Clear to auscultation bilaterally.  CVS: S1, S2 no murmurs, no S3.Regular rate.  ABD: Soft non tender.   Ext: No edema  MS: Adequate ROM spine, shoulders, hips and knees.  Skin: Intact, no ulcerations or rash noted.  Psych: Good eye contact, normal affect. Memory intact not anxious or depressed appearing.  CNS: CN 2-12 intact, power,  normal throughout.no focal deficits noted.   Assessment & Plan  HTN (hypertension) Controlled, no change in medication DASH diet and commitment to daily physical activity for a minimum of 30 minutes discussed and encouraged, as a part of  hypertension management. The importance of attaining a healthy weight is also discussed.     12/23/2021   10:59 AM 08/19/2021   11:02 AM 03/24/2021    9:42 AM 03/24/2021    9:16 AM 11/11/2020    9:07 AM 08/11/2020    9:58 AM 06/17/2020   10:51 AM  BP/Weight  Systolic BP 542 706 237 628 315 176 160  Diastolic BP 66 80 80 73 74 72 81  Wt. (Lbs) 218 217.04  214.08 215 213 211.12  BMI 39.24 kg/m2 41.01 kg/m2  39.8 kg/m2 39.32 kg/m2 38.96 kg/m2 39.24 kg/m2       Hypothyroidism Controlled, no change in medication   GERD (gastroesophageal reflux disease) Controlled, no change in medication   Morbid obesity ([place])  Patient re-educated about  the importance of commitment to a  minimum of 150 minutes of exercise per week as able.  The importance of healthy food choices with portion control discussed, as well as eating regularly and within a 12 hour window most days. The need to choose "clean , green" food 50 to 75% of the time is discussed, as well as to make water the primary drink and set a goal of 64 ounces water daily.       12/23/2021   10:59 AM 08/19/2021   11:02 AM 03/24/2021    9:16 AM  Weight /BMI  Weight 218 lb 217 lb 0.6 oz 214 lb 1.3 oz  Height 5' 2.5" (1.588 m) '5\' 1"'$  (1.549 m) 5' 1.5" (1.562 m)  BMI 39.24  kg/m2 41.01 kg/m2 39.8 kg/m2    Start weekly wegovy or half phentermine daily if not covered

## 2021-12-23 NOTE — Patient Instructions (Addendum)
Annual exam Dec 14 or after, call if you need me sooner  New for weight loss is once weekly wegovy injection, if not covered I will prescribe phentermine half tab daily   Goal of 4 pounds/ month weight loss  Please schedule nov mammogram at checkout  Labs are excellent , no med changes  Please reconsider vaccines  Eat vegetables, fruit, lean meat like chicken, [place] fish, eggs and use beans as spoource of protein and starch and fiber  Water, 64 oz per day  Thanks for choosing [facility], we consider it a privelige to serve you.

## 2021-12-23 NOTE — Assessment & Plan Note (Signed)
  Patient re-educated about  the importance of commitment to a  minimum of 150 minutes of exercise per week as able.  The importance of healthy food choices with portion control discussed, as well as eating regularly and within a 12 hour window most days. The need to choose "clean , green" food 50 to 75% of the time is discussed, as well as to make water the primary drink and set a goal of 64 ounces water daily.       12/23/2021   10:59 AM 08/19/2021   11:02 AM 03/24/2021    9:16 AM  Weight /BMI  Weight 218 lb 217 lb 0.6 oz 214 lb 1.3 oz  Height 5' 2.5" (1.588 m) '5\' 1"'$  (1.549 m) 5' 1.5" (1.562 m)  BMI 39.24 kg/m2 41.01 kg/m2 39.8 kg/m2    Start weekly wegovy or half phentermine daily if not covered

## 2021-12-23 NOTE — Assessment & Plan Note (Signed)
Controlled, no change in medication  

## 2021-12-23 NOTE — Assessment & Plan Note (Signed)
Controlled, no change in medication DASH diet and commitment to daily physical activity for a minimum of 30 minutes discussed and encouraged, as a part of hypertension management. The importance of attaining a healthy weight is also discussed.     12/23/2021   10:59 AM 08/19/2021   11:02 AM 03/24/2021    9:42 AM 03/24/2021    9:16 AM 11/11/2020    9:07 AM 08/11/2020    9:58 AM 06/17/2020   10:51 AM  BP/Weight  Systolic BP 153 794 327 614 709 295 747  Diastolic BP 66 80 80 73 74 72 81  Wt. (Lbs) 218 217.04  214.08 215 213 211.12  BMI 39.24 kg/m2 41.01 kg/m2  39.8 kg/m2 39.32 kg/m2 38.96 kg/m2 39.24 kg/m2

## 2022-01-04 ENCOUNTER — Telehealth: Payer: Self-pay | Admitting: Family Medicine

## 2022-01-04 NOTE — Telephone Encounter (Signed)
Pt called stating the medication (does not know name) she was prescribed her insurance will not cover it. Wants to know if she can please get something else?

## 2022-01-06 ENCOUNTER — Other Ambulatory Visit: Payer: Self-pay | Admitting: Family Medicine

## 2022-01-06 MED ORDER — PHENTERMINE HCL 37.5 MG PO TABS: ORAL_TABLET | ORAL | 1 refills | Status: DC

## 2022-01-06 NOTE — Telephone Encounter (Signed)
Patient returning call.

## 2022-01-06 NOTE — Telephone Encounter (Signed)
LMTRC

## 2022-01-06 NOTE — Telephone Encounter (Signed)
Wants something else for weight loss, previous med too expensive

## 2022-01-07 NOTE — Telephone Encounter (Signed)
Patient aware.

## 2022-02-19 ENCOUNTER — Ambulatory Visit (INDEPENDENT_AMBULATORY_CARE_PROVIDER_SITE_OTHER): Payer: Medicare HMO | Admitting: Family Medicine

## 2022-02-19 ENCOUNTER — Encounter: Payer: Self-pay | Admitting: Family Medicine

## 2022-02-19 VITALS — BP 136/80 | HR 71 | Ht 61.0 in | Wt 211.0 lb

## 2022-02-19 DIAGNOSIS — L659 Nonscarring hair loss, unspecified: Secondary | ICD-10-CM | POA: Diagnosis not present

## 2022-02-19 DIAGNOSIS — R7302 Impaired glucose tolerance (oral): Secondary | ICD-10-CM | POA: Diagnosis not present

## 2022-02-19 DIAGNOSIS — E038 Other specified hypothyroidism: Secondary | ICD-10-CM | POA: Diagnosis not present

## 2022-02-19 DIAGNOSIS — E785 Hyperlipidemia, unspecified: Secondary | ICD-10-CM | POA: Diagnosis not present

## 2022-02-19 DIAGNOSIS — I10 Essential (primary) hypertension: Secondary | ICD-10-CM | POA: Diagnosis not present

## 2022-02-19 MED ORDER — PHENTERMINE HCL 37.5 MG PO TABS: ORAL_TABLET | ORAL | 1 refills | Status: DC

## 2022-02-19 MED ORDER — MINOXIDIL 2.5 MG PO TABS: 2.5000 mg | ORAL_TABLET | Freq: Every day | ORAL | 2 refills | Status: DC

## 2022-02-19 NOTE — Progress Notes (Signed)
MARRIETTA Reyes     MRN: [id number]      DOB: [birth year]   HPI Ms. Bonsall is here for follow up and re-evaluation of chronic medical conditions, medication management and review of any available recent lab and radiology data.  Preventive health is updated, specifically  Cancer screening and Immunization.   Questions or concerns regarding consultations or procedures which the PT has had in the interim are  addressed. The PT denies any adverse reactions to current medications since the last visit.  Tolerating medication for weight loss well, feels " not working" wants a higher dose, however, she has lost 7 pounds in the past 8 weeks  C/o ongoing hair loss  ROS Denies recent fever or chills. Denies sinus pressure, nasal congestion, ear pain or sore throat. Denies chest congestion, productive cough or wheezing. Denies chest pains, palpitations and leg swelling Denies abdominal pain, nausea, vomiting,diarrhea or constipation.   Denies dysuria, frequency, hesitancy or incontinence. Chronic  joint pain, swelling and limitation in mobility. Denies headaches, seizures, numbness, or tingling. Denies depression, anxiety or insomnia. Denies skin break down or rash.   PE  BP 136/80   Pulse 71   Ht '5\' 1"'$  (1.549 m)   Wt 211 lb (95.7 kg)   SpO2 91%   BMI 39.87 kg/m   Patient alert and oriented and in no cardiopulmonary distress.  HEENT: No facial asymmetry, EOMI,     Neck supple .  Chest: Clear to auscultation bilaterally.  CVS: S1, S2 no murmurs, no S3.Regular rate.  ABD: Soft non tender.   Ext: No edema  MS: Adequate ROM spine, shoulders, hips and knees.  Skin: Intact, no ulcerations or rash noted.  Psych: Good eye contact, normal affect. Memory intact not anxious or depressed appearing.  CNS: CN 2-12 intact, power,  normal throughout.no focal deficits noted.   Assessment & Plan  HTN (hypertension) Controlled, add minoxidil 2.5 mg tab DASH diet and commitment to  daily physical activity for a minimum of 30 minutes discussed and encouraged, as a part of hypertension management. The importance of attaining a healthy weight is also discussed.     02/19/2022    9:45 AM 02/19/2022    9:23 AM 12/23/2021   10:59 AM 08/19/2021   11:02 AM 03/24/2021    9:42 AM 03/24/2021    9:16 AM 11/11/2020    9:07 AM  BP/Weight  Systolic BP 010 272 536 644 034 742 595  Diastolic BP 80 79 66 80 80 73 74  Wt. (Lbs)  211 218 217.04  214.08 215  BMI  39.87 kg/m2 39.24 kg/m2 41.01 kg/m2  39.8 kg/m2 39.32 kg/m2       Hypothyroidism Controlled, no change in medication   Hyperlipidemia with target LDL less than 100 Hyperlipidemia:Low fat diet discussed and encouraged.   Lipid Panel  Lab Results  Component Value Date   CHOL 178 12/17/2021   HDL 58 12/17/2021   LDLCALC 100 (H) 12/17/2021   TRIG 110 12/17/2021   CHOLHDL 3.1 12/17/2021     Needs to reduce fat in diet  IGT (impaired glucose tolerance) Patient educated about the importance of limiting  Carbohydrate intake , the need to commit to daily physical activity for a minimum of 30 minutes , and to commit weight loss. The fact that changes in all these areas will reduce or eliminate all together the development of diabetes is stressed.      Latest Ref Rng & Units 12/17/2021    9:19  AM 02/18/2021   10:47 AM 02/07/2020    7:25 AM 01/31/2020    6:31 AM 01/30/2020    4:19 AM  Diabetic Labs  Chol 100 - 199 mg/dL 178  183  188     HDL >39 mg/dL 58  58  63     Calc LDL 0 - 99 mg/dL 100  104  104     Triglycerides 0 - 149 mg/dL 110  120  114     Creatinine 0.57 - 1.00 mg/dL 0.73  0.62  0.78  0.66  0.69       02/19/2022    9:45 AM 02/19/2022    9:23 AM 12/23/2021   10:59 AM 08/19/2021   11:02 AM 03/24/2021    9:42 AM 03/24/2021    9:16 AM 11/11/2020    9:07 AM  BP/Weight  Systolic BP 937 169 678 938 101 751 025  Diastolic BP 80 79 66 80 80 73 74  Wt. (Lbs)  211 218 217.04  214.08 215  BMI  39.87 kg/m2  39.24 kg/m2 41.01 kg/m2  39.8 kg/m2 39.32 kg/m2       No data to display          Updated lab needed at/ before next visit.   Morbid obesity ([place]) Improved, no med change  Patient re-educated about  the importance of commitment to a  minimum of 150 minutes of exercise per week as able.  The importance of healthy food choices with portion control discussed, as well as eating regularly and within a 12 hour window most days. The need to choose "clean , green" food 50 to 75% of the time is discussed, as well as to make water the primary drink and set a goal of 64 ounces water daily.       02/19/2022    9:23 AM 12/23/2021   10:59 AM 08/19/2021   11:02 AM  Weight /BMI  Weight 211 lb 218 lb 217 lb 0.6 oz  Height '5\' 1"'$  (1.549 m) 5' 2.5" (1.588 m) '5\' 1"'$  (1.549 m)  BMI 39.87 kg/m2 39.24 kg/m2 41.01 kg/m2      Alopecia Addition of minoxidil will be benficial

## 2022-02-19 NOTE — Assessment & Plan Note (Signed)
Patient educated about the importance of limiting  Carbohydrate intake , the need to commit to daily physical activity for a minimum of 30 minutes , and to commit weight loss. The fact that changes in all these areas will reduce or eliminate all together the development of diabetes is stressed.      Latest Ref Rng & Units 12/17/2021    9:19 AM 02/18/2021   10:47 AM 02/07/2020    7:25 AM 01/31/2020    6:31 AM 01/30/2020    4:19 AM  Diabetic Labs  Chol 100 - 199 mg/dL 178  183  188     HDL >39 mg/dL 58  58  63     Calc LDL 0 - 99 mg/dL 100  104  104     Triglycerides 0 - 149 mg/dL 110  120  114     Creatinine 0.57 - 1.00 mg/dL 0.73  0.62  0.78  0.66  0.69       02/19/2022    9:45 AM 02/19/2022    9:23 AM 12/23/2021   10:59 AM 08/19/2021   11:02 AM 03/24/2021    9:42 AM 03/24/2021    9:16 AM 11/11/2020    9:07 AM  BP/Weight  Systolic BP 734 193 790 240 973 532 992  Diastolic BP 80 79 66 80 80 73 74  Wt. (Lbs)  211 218 217.04  214.08 215  BMI  39.87 kg/m2 39.24 kg/m2 41.01 kg/m2  39.8 kg/m2 39.32 kg/m2       No data to display          Updated lab needed at/ before next visit.

## 2022-02-19 NOTE — Assessment & Plan Note (Signed)
Controlled, no change in medication  

## 2022-02-19 NOTE — Assessment & Plan Note (Signed)
Hyperlipidemia:Low fat diet discussed and encouraged.   Lipid Panel  Lab Results  Component Value Date   CHOL 178 12/17/2021   HDL 58 12/17/2021   LDLCALC 100 (H) 12/17/2021   TRIG 110 12/17/2021   CHOLHDL 3.1 12/17/2021     Needs to reduce fat in diet

## 2022-02-19 NOTE — Patient Instructions (Signed)
F/U as before, call if you need me sooner  Congrats on 7 pound weight loss, continue phentermine half tab daily  New additional medicine for blood pressure minoxidil one daily which will help with hair growth  It is important that you exercise regularly at least 30 minutes 5 times a week. If you develop chest pain, have severe difficulty breathing, or feel very tired, stop exercising immediately and seek medical attention   Think about what you will eat, plan ahead. Choose " clean, green, fresh or frozen" over canned, processed or packaged foods which are more sugary, salty and fatty. 70 to 75% of food eaten should be vegetables and fruit. Three meals at set times with snacks allowed between meals, but they must be fruit or vegetables. Aim to eat over a 12 hour period , example 7 am to 7 pm, and STOP after  your last meal of the day. Drink water,generally about 64 ounces per day, no other drink is as healthy. Fruit juice is best enjoyed in a healthy way, by EATING the fruit.   Thanks for choosing [facility], we consider it a privelige to serve you.

## 2022-02-19 NOTE — Assessment & Plan Note (Signed)
Improved, no med change  Patient re-educated about  the importance of commitment to a  minimum of 150 minutes of exercise per week as able.  The importance of healthy food choices with portion control discussed, as well as eating regularly and within a 12 hour window most days. The need to choose "clean , green" food 50 to 75% of the time is discussed, as well as to make water the primary drink and set a goal of 64 ounces water daily.       02/19/2022    9:23 AM 12/23/2021   10:59 AM 08/19/2021   11:02 AM  Weight /BMI  Weight 211 lb 218 lb 217 lb 0.6 oz  Height '5\' 1"'$  (1.549 m) 5' 2.5" (1.588 m) '5\' 1"'$  (1.549 m)  BMI 39.87 kg/m2 39.24 kg/m2 41.01 kg/m2

## 2022-02-19 NOTE — Assessment & Plan Note (Signed)
Addition of minoxidil will be benficial

## 2022-02-19 NOTE — Assessment & Plan Note (Addendum)
Controlled, add minoxidil 2.5 mg tab DASH diet and commitment to daily physical activity for a minimum of 30 minutes discussed and encouraged, as a part of hypertension management. The importance of attaining a healthy weight is also discussed.     02/19/2022    9:45 AM 02/19/2022    9:23 AM 12/23/2021   10:59 AM 08/19/2021   11:02 AM 03/24/2021    9:42 AM 03/24/2021    9:16 AM 11/11/2020    9:07 AM  BP/Weight  Systolic BP 709 295 747 340 370 964 383  Diastolic BP 80 79 66 80 80 73 74  Wt. (Lbs)  211 218 217.04  214.08 215  BMI  39.87 kg/m2 39.24 kg/m2 41.01 kg/m2  39.8 kg/m2 39.32 kg/m2

## 2022-02-22 ENCOUNTER — Telehealth: Payer: Self-pay | Admitting: Family Medicine

## 2022-02-22 ENCOUNTER — Ambulatory Visit (HOSPITAL_COMMUNITY)
Admission: RE | Admit: 2022-02-22 | Discharge: 2022-02-22 | Disposition: A | Discharge status: Home / Self Care | Payer: Medicare HMO | Source: Ambulatory Visit | Attending: Family Medicine | Admitting: Family Medicine

## 2022-02-22 DIAGNOSIS — Z1231 Encounter for screening mammogram for malignant neoplasm of breast: Secondary | ICD-10-CM | POA: Insufficient documentation

## 2022-02-22 NOTE — Telephone Encounter (Signed)
Patient is calling in regard to recent prescriptions.  Patient wants a call back in regard, wants to k ow if they were previously prescribed for her, she's had a past med that has made her head hurt and just wants to check.

## 2022-02-22 NOTE — Telephone Encounter (Signed)
Called pt no answer left vm 

## 2022-02-25 ENCOUNTER — Other Ambulatory Visit (HOSPITAL_COMMUNITY): Payer: Self-pay | Admitting: Family Medicine

## 2022-02-25 DIAGNOSIS — R928 Other abnormal and inconclusive findings on diagnostic imaging of breast: Secondary | ICD-10-CM

## 2022-02-25 NOTE — Progress Notes (Signed)
LMTRC

## 2022-02-26 NOTE — Telephone Encounter (Signed)
Called pt no answer left vm. Per Dr Moshe Cipro she can use roll on rogaine for hair loss, if she doesn't want to take the oral medication

## 2022-02-26 NOTE — Telephone Encounter (Signed)
Pt called in wants to know if increasing the spironolactine going to help with her hair shedding states that she hasn't taken the minoxidil since last year. Please advise.

## 2022-03-01 NOTE — Telephone Encounter (Signed)
Called pt no answer left vm 

## 2022-03-02 NOTE — Telephone Encounter (Signed)
Called pt no answer left vm 

## 2022-03-02 NOTE — Telephone Encounter (Signed)
Pt returned call

## 2022-03-03 NOTE — Telephone Encounter (Signed)
Spoke with pt advised of Dr Griffin Dakin message pt verbalized understanding

## 2022-03-10 ENCOUNTER — Ambulatory Visit (HOSPITAL_COMMUNITY)
Admission: RE | Admit: 2022-03-10 | Discharge: 2022-03-10 | Disposition: A | Discharge status: Home / Self Care | Payer: Medicare HMO | Source: Ambulatory Visit | Attending: Family Medicine | Admitting: Family Medicine

## 2022-03-10 DIAGNOSIS — R928 Other abnormal and inconclusive findings on diagnostic imaging of breast: Secondary | ICD-10-CM | POA: Diagnosis not present

## 2022-03-10 DIAGNOSIS — R92311 Mammographic fatty tissue density, right breast: Secondary | ICD-10-CM | POA: Diagnosis not present

## 2022-03-15 ENCOUNTER — Ambulatory Visit (INDEPENDENT_AMBULATORY_CARE_PROVIDER_SITE_OTHER): Payer: Medicare HMO | Admitting: Internal Medicine

## 2022-03-15 ENCOUNTER — Encounter: Payer: Self-pay | Admitting: Internal Medicine

## 2022-03-15 DIAGNOSIS — Z0000 Encounter for general adult medical examination without abnormal findings: Secondary | ICD-10-CM | POA: Diagnosis not present

## 2022-03-15 NOTE — Patient Instructions (Signed)
  Victoria Reyes , Thank you for taking time to come for your Medicare Wellness Visit. I appreciate your ongoing commitment to your health goals. Please review the following plan we discussed and let me know if I can assist you in the future.   These are the goals we discussed:  Patient found her a job and would like to continue this job. It brings her joy and keeps her active.   This is a list of the screening recommended for you and due dates:  Health Maintenance  Topic Date Due   DTaP/Tdap/Td vaccine (1 - Tdap) Never done   Pneumonia Vaccine (1 - PCV) Never done   Medicare Annual Wellness Visit  03/11/2022   Zoster (Shingles) Vaccine (1 of 2) 03/24/2022*   Flu Shot  07/11/2022*   COVID-19 Vaccine (1) 02/23/2023*   Mammogram  02/23/2024   Colon Cancer Screening  05/13/2030   DEXA scan (bone density measurement)  Completed   Hepatitis C Screening: USPSTF Recommendation to screen - Ages 37-79 yo.  Completed   HPV Vaccine  Aged Out  *Topic was postponed. The date shown is not the original due date.

## 2022-03-15 NOTE — Progress Notes (Signed)
Subjective:  This is a telephone encounter between Victoria Reyes and Lorene Dy on 03/15/2022 for AWV. The visit was conducted with the patient located at home and Lorene Dy at [facility]. The patient's identity was confirmed using their DOB and current address. The patient has consented to being evaluated through a telephone encounter and understands the associated risks (an examination cannot be done and the patient may need to come in for an appointment) / benefits (allows the patient to remain at home, decreasing exposure to coronavirus).      Victoria Reyes is a 71 y.o. female who presents for Medicare Annual (Subsequent) preventive examination.  Review of Systems    Review of Systems  All other systems reviewed and are negative.    Objective:    There were no vitals filed for this visit. There is no height or weight on file to calculate BMI.     03/11/2021    9:40 AM 05/13/2020   12:37 PM 05/12/2020    9:48 AM 01/29/2020    5:29 AM 01/28/2020    8:57 PM 01/17/2020   11:42 AM 12/19/2017   10:48 AM  Advanced Directives  Does Patient Have a Medical Advance Directive? No Yes Yes Yes Yes Yes No  Type of Advance Directive  Living will  Living will Living will Living will   Does patient want to make changes to medical advance directive?   No - Patient declined No - Patient declined  No - Patient declined Yes (ED - Information included in AVS)  Would patient like information on creating a medical advance directive? Yes (ED - Information included in AVS)   No - Patient declined  Yes (MAU/Ambulatory/Procedural Areas - Information given)     Current Medications (verified) Outpatient Encounter Medications as of 03/15/2022  Medication Sig   Cholecalciferol (VITAMIN D) 50 MCG (2000 UT) CAPS Take 2,000 Units by mouth daily at 12 noon.   levothyroxine (SYNTHROID) 100 MCG tablet TAKE 1 TABLET BY MOUTH EVERY DAY   phentermine (ADIPEX-P) 37.5 MG tablet Take  half tablet by mouth once daily  every morning at breakfast   spironolactone (ALDACTONE) 25 MG tablet TAKE 1 TABLET (25 MG TOTAL) BY MOUTH DAILY.   minoxidil (LONITEN) 2.5 MG tablet Take 1 tablet (2.5 mg total) by mouth daily. (Patient not taking: Reported on 03/15/2022)   No facility-administered encounter medications on file as of 03/15/2022.    Allergies (verified) Nsaids, Hydrocodone, and Sulfa antibiotics   History: Past Medical History:  Diagnosis Date   Anemia    as a  young woman   Diverticulitis 2005   CT    DJD (degenerative joint disease)    GERD (gastroesophageal reflux disease)    H. pylori infection 01/2020   S/p treatment with omeprazole, amoxicillin, and Biaxin.   Headache(784.0)    Hiatal hernia    Hyperlipidemia    Hypertension    Hypothyroidism    Knee pain, right    Low back pain    Neuropathy    peroneal nerve   Obesity    Pneumonia    as child   S/P colonoscopy 2005   Dr. Tamala Julian: sigmoid diverticulosis   Thyroid disease    Tobacco abuse    Past Surgical History:  Procedure Laterality Date   BACK SURGERY     BIOPSY  01/29/2020   Procedure: BIOPSY;  Surgeon: Harvel Quale, MD;  Location: [facility];  Service: Gastroenterology;;   BIOPSY  05/13/2020  Procedure: BIOPSY;  Surgeon: Eloise Harman, DO;  Location: [facility] ENDOSCOPY;  Service: Endoscopy;;   COLONOSCOPY  12/18/2010   Procedure: COLONOSCOPY;  Surgeon: Dorothyann Peng, MD; 3 mm sessile polyp at hepatic flexure, pancolonic diverticulosis, internal hemorrhoids, anal lesion just above the dentate line biopsied via cold forceps.  Colonic polyp was a tubular adenoma.  Anus biopsy with mild active inflammation associated with reactive epithelial changes and hemorrhage.  Recommended repeat colonoscopy in 2022.   COLONOSCOPY WITH PROPOFOL N/A 05/13/2020   Procedure: COLONOSCOPY WITH PROPOFOL;  Surgeon: Eloise Harman, DO;  Location: [facility] ENDOSCOPY;  Service: Endoscopy;  Laterality: N/A;  12:30PM   cyst removal right wrist      ESOPHAGOGASTRODUODENOSCOPY (EGD) WITH PROPOFOL N/A 01/29/2020   Procedure: ESOPHAGOGASTRODUODENOSCOPY (EGD) WITH PROPOFOL;  Surgeon: Montez Morita, Quillian Quince, MD;   3 gastric ulcers with clean base, 5 ulcers in the duodenal bulb and first portion of duodenum, 2 were noted to be oozing, treated with bipolar cautery.  Gastric biopsies consistent with H. pylori gastritis.   ESOPHAGOGASTRODUODENOSCOPY (EGD) WITH PROPOFOL N/A 05/13/2020   Procedure: ESOPHAGOGASTRODUODENOSCOPY (EGD) WITH PROPOFOL;  Surgeon: Eloise Harman, DO;  Location: [place];  Service: Endoscopy;  Laterality: N/A;   EYE SURGERY Bilateral    cataract surgery with lens implant   POLYPECTOMY  05/13/2020   Procedure: POLYPECTOMY;  Surgeon: Eloise Harman, DO;  Location: [place];  Service: Endoscopy;;   TONSILLECTOMY     TOTAL SHOULDER ARTHROPLASTY Left 12/21/2016   Procedure: TOTAL SHOULDER ARTHROPLASTY;  Surgeon: Meredith Pel, MD;  Location: [place];  Service: Orthopedics;  Laterality: Left;   TUBAL LIGATION     umbillical hernia     Family History  Problem Relation Age of Onset   Heart attack Sister        pacemaker   Kidney failure Sister        on dialysis   Colon cancer Neg Hx    Stomach cancer Neg Hx    Esophageal cancer Neg Hx    Social History   Socioeconomic History   Marital status: Widowed    Spouse name: Not on file   Number of children: Not on file   Years of education: 12   Highest education level: Some college, no degree  Occupational History   Not on file  Tobacco Use   Smoking status: Former    Packs/day: 0.25    Years: 45.00    Total pack years: 11.25    Types: Cigarettes    Start date: 09/30/1963    Quit date: 08/15/2015    Years since quitting: 6.5   Smokeless tobacco: Never  Vaping Use   Vaping Use: Never used  Substance and Sexual Activity   Alcohol use: No    Alcohol/week: 0.0 standard drinks of alcohol   Drug use: No   Sexual activity: Not Currently    Birth  control/protection: Post-menopausal  Other Topics Concern   Not on file  Social History Narrative   Not on file   Social Determinants of Health   Financial Resource Strain: Low Risk  (03/11/2021)   Overall Financial Resource Strain (CARDIA)    Difficulty of Paying Living Expenses: Not hard at all  Food Insecurity: No Food Insecurity (03/11/2021)   Hunger Vital Sign    Worried About Running Out of Food in the Last Year: Never true    Ran Out of Food in the Last Year: Never true  Transportation Needs: No Transportation Needs (03/11/2021)  PRAPARE - Hydrologist (Medical): No    Lack of Transportation (Non-Medical): No  Physical Activity: Inactive (03/11/2021)   Exercise Vital Sign    Days of Exercise per Week: 0 days    Minutes of Exercise per Session: 0 min  Stress: No Stress Concern Present (01/17/2020)   [place]    Feeling of Stress : Not at all  Social Connections: Unknown (03/11/2021)   Social Connection and Isolation Panel [NHANES]    Frequency of Communication with Friends and Family: Three times a week    Frequency of Social Gatherings with Friends and Family: Three times a week    Attends Religious Services: Never    Active Member of Clubs or Organizations: No    Attends Archivist Meetings: Never    Marital Status: Not on file    Tobacco Counseling Counseling given: Not Answered   Clinical Intake:  Pre-visit preparation completed: Yes  Pain : No/denies pain     Diabetes: No  How often do you need to have someone help you when you read instructions, pamphlets, or other written materials from your doctor or pharmacy?: 1 - Never What is the last grade level you completed in school?: 12th grade  Diabetic?no         Activities of Daily Living    03/15/2022   11:44 AM  In your present state of health, do you have any difficulty performing the  following activities:  Hearing? 0  Vision? 0  Difficulty concentrating or making decisions? 0  Walking or climbing stairs? 0  Dressing or bathing? 0  Doing errands, shopping? 0    Patient Care Team: Fayrene Helper, MD as PCP - General (Family Medicine) Eloise Harman, DO as Consulting Physician (Gastroenterology)  Indicate any recent Medical Services you may have received from other than [facility] providers in the past year (date may be approximate).     Assessment:   This is a routine wellness examination for Victoria Reyes.  Hearing/Vision screen No results found.  Dietary issues and exercise activities discussed:     Goals Addressed   None    Depression Screen    03/15/2022   11:44 AM 03/15/2022   11:07 AM 02/19/2022    9:26 AM 08/19/2021   11:03 AM 03/24/2021    9:15 AM 03/11/2021    9:25 AM 06/17/2020   10:53 AM  PHQ 2/9 Scores  PHQ - 2 Score 0 0 0 0 0 0 0  PHQ- 9 Score  0   0      Fall Risk    03/15/2022   11:43 AM 03/15/2022   11:07 AM 02/19/2022    9:26 AM 08/19/2021   11:03 AM 03/24/2021    9:15 AM  Fall Risk   Falls in the past year? 0 0 0 0 0  Number falls in past yr: 0 0 0 0 0  Injury with Fall? 0 0 0 0 0  Risk for fall due to :   No Fall Risks No Fall Risks   Follow up  Falls evaluation completed Falls evaluation completed Falls evaluation completed     [place]:  Any stairs in or around the home? No  If so, are there any without handrails? No  Home free of loose throw rugs in walkways, pet beds, electrical cords, etc? Yes  Adequate lighting in your home to reduce risk of  falls? Yes   ASSISTIVE DEVICES UTILIZED TO PREVENT FALLS:  Life alert? No  Use of a cane, walker or w/c? No  Grab bars in the bathroom? No  Shower chair or bench in shower? No  Elevated toilet seat or a handicapped toilet? No    Cognitive Function:        03/15/2022   11:44 AM 03/11/2021    9:34 AM 01/17/2020   11:44 AM 12/22/2018    10:20 AM 12/19/2017   10:51 AM  6CIT Screen  What Year? 0 points 0 points 0 points 0 points 0 points  What month? 0 points 0 points 0 points 0 points 0 points  What time? 0 points 0 points 0 points 0 points 0 points  Count back from 20 0 points 0 points 0 points 0 points 0 points  Months in reverse 0 points 0 points 0 points 0 points 0 points  Repeat phrase 0 points 0 points 0 points 0 points 0 points  Total Score 0 points 0 points 0 points 0 points 0 points    Immunizations There is no immunization history for the selected administration types on file for this patient.  TDAP status: Due, Education has been provided regarding the importance of this vaccine. Advised may receive this vaccine at local pharmacy or Health Dept. Aware to provide a copy of the vaccination record if obtained from local pharmacy or Health Dept. Verbalized acceptance and understanding.  Flu Vaccine status: Declined, Education has been provided regarding the importance of this vaccine but patient still declined. Advised may receive this vaccine at local pharmacy or Health Dept. Aware to provide a copy of the vaccination record if obtained from local pharmacy or Health Dept. Verbalized acceptance and understanding.  Pneumococcal vaccine status: Declined,  Education has been provided regarding the importance of this vaccine but patient still declined. Advised may receive this vaccine at local pharmacy or Health Dept. Aware to provide a copy of the vaccination record if obtained from local pharmacy or Health Dept. Verbalized acceptance and understanding.   Covid-19 vaccine status: Declined, Education has been provided regarding the importance of this vaccine but patient still declined. Advised may receive this vaccine at local pharmacy or Health Dept.or vaccine clinic. Aware to provide a copy of the vaccination record if obtained from local pharmacy or Health Dept. Verbalized acceptance and understanding.  Qualifies for  Shingles Vaccine? Yes   Zostavax completed No   Shingrix Completed?: No.    Education has been provided regarding the importance of this vaccine. Patient has been advised to call insurance company to determine out of pocket expense if they have not yet received this vaccine. Advised may also receive vaccine at local pharmacy or Health Dept. Verbalized acceptance and understanding.  Screening Tests Health Maintenance  Topic Date Due   DTaP/Tdap/Td (1 - Tdap) Never done   Pneumonia Vaccine 71+ Years old (1 - PCV) Never done   Medicare Annual Wellness (AWV)  03/11/2022   Zoster Vaccines- Shingrix (1 of 2) 03/24/2022 (Originally 10/06/1999)   INFLUENZA VACCINE  07/11/2022 (Originally 11/10/2021)   COVID-19 Vaccine (1) 02/23/2023 (Originally 04/06/1951)   MAMMOGRAM  02/23/2024   COLONOSCOPY (Pts 45-30yr Insurance coverage will need to be confirmed)  05/13/2030   DEXA SCAN  Completed   Hepatitis C Screening  Completed   HPV VACCINES  Aged Out    Health Maintenance  Health Maintenance Due  Topic Date Due   DTaP/Tdap/Td (1 - Tdap) Never done   Pneumonia Vaccine  70+ Years old (1 - PCV) Never done   Medicare Annual Wellness (AWV)  03/11/2022    Colorectal cancer screening: Type of screening: Colonoscopy. Completed 05/13/2020. Repeat every 10 years  Mammogram status: Completed 02/22/2022. Repeat every year  Bone Density status: Completed 12/28/2017. Results reflect: Bone density results: OSTEOPENIA.   Additional Screening:  Hepatitis C Screening: does not qualify; Completed 07/02/2015  Vision Screening: Recommended annual ophthalmology exams for early detection of glaucoma and other disorders of the eye. Is the patient up to date with their annual eye exam?  Yes  Who is the provider or what is the name of the office in which the patient attends annual eye exams? [facility] If pt is not established with a provider, would they like to be referred to a provider to establish care?  No .   Dental Screening: Recommended annual dental exams for proper oral hygiene  Community Resource Referral / Chronic Care Management: CRR required this visit?  No   CCM required this visit?  No      Plan:     I have personally reviewed and noted the following in the patient's chart:   Medical and social history Use of alcohol, tobacco or illicit drugs  Current medications and supplements including opioid prescriptions. Patient is not currently taking opioid prescriptions. Functional ability and status Nutritional status Physical activity Advanced directives List of other physicians Hospitalizations, surgeries, and ER visits in previous 12 months Vitals Screenings to include cognitive, depression, and falls Referrals and appointments  In addition, I have reviewed and discussed with patient certain preventive protocols, quality metrics, and best practice recommendations. A written personalized care plan for preventive services as well as general preventive health recommendations were provided to patient.     Lorene Dy, MD   03/15/2022

## 2022-03-26 ENCOUNTER — Encounter: Payer: Self-pay | Admitting: Family Medicine

## 2022-03-26 ENCOUNTER — Ambulatory Visit (INDEPENDENT_AMBULATORY_CARE_PROVIDER_SITE_OTHER): Payer: Medicare HMO | Admitting: Family Medicine

## 2022-03-26 VITALS — BP 134/81 | HR 98 | Ht 61.0 in | Wt 205.1 lb

## 2022-03-26 DIAGNOSIS — Z0000 Encounter for general adult medical examination without abnormal findings: Secondary | ICD-10-CM

## 2022-03-26 NOTE — Patient Instructions (Signed)
F/U in 3 months, call if you need me before  No change,in medication  Keep up great weight loss  It is important that you exercise regularly at least 30 minutes 5 times a week. If you develop chest pain, have severe difficulty breathing, or feel very tired, stop exercising immediately and seek medical attention

## 2022-03-28 ENCOUNTER — Encounter: Payer: Self-pay | Admitting: Family Medicine

## 2022-03-28 NOTE — Assessment & Plan Note (Signed)

## 2022-03-28 NOTE — Progress Notes (Signed)
    Victoria Reyes     MRN: [id number]      DOB: 1951/03/10  HPI: Patient is in for annual physical exam. No other health concerns are expressed or addressed at the visit. Recent labs,  are reviewed. Immunization is reviewed , and  updated if needed.   PE: BP 134/81 (BP Location: Right Arm, Patient Position: Sitting, Cuff Size: Large)   Pulse 98   Ht '5\' 1"'$  (1.549 m)   Wt 205 lb 1.9 oz (93 kg)   SpO2 95%   BMI 38.76 kg/m   Pleasant  female, alert and oriented x 3, in no cardio-pulmonary distress. Afebrile. HEENT No facial trauma or asymetry. Sinuses non tender.  Extra occullar muscles intact.. External ears normal, . Neck: supple, no adenopathy,JVD or thyromegaly.No bruits.  Chest: Clear to ascultation bilaterally.No crackles or wheezes. Non tender to palpation    Cardiovascular system; Heart sounds normal,  S1 and  S2 ,no S3.  No murmur, or thrill. Apical beat not displaced Peripheral pulses normal.  Abdomen: Soft, non tender,  Musculoskeletal exam: Adequate ROM of spine, hips , shoulders and knees. No deformity ,swelling or crepitus noted. No muscle wasting or atrophy.   Neurologic: Cranial nerves 2 to 12 intact. Power, tone ,sensation and reflexes normal throughout. No disturbance in gait. No tremor.  Skin: Intact, no ulceration, erythema , scaling or rash noted. Pigmentation normal throughout  Psych; Normal mood and affect. Judgement and concentration normal   Assessment & Plan:  Encounter for annual physical exam Annual exam as documented. Counseling done  re healthy lifestyle involving commitment to 150 minutes exercise per week, heart healthy diet, and attaining healthy weight.The importance of adequate sleep also discussed. Regular seat belt use and home safety, is also discussed. Changes in health habits are decided on by the patient with goals and time frames  set for achieving them. Immunization and cancer screening needs are specifically  addressed at this visit.

## 2022-05-17 ENCOUNTER — Telehealth: Payer: Self-pay | Admitting: Family Medicine

## 2022-05-17 DIAGNOSIS — G629 Polyneuropathy, unspecified: Secondary | ICD-10-CM

## 2022-05-17 DIAGNOSIS — T148XXA Other injury of unspecified body region, initial encounter: Secondary | ICD-10-CM

## 2022-05-17 NOTE — Telephone Encounter (Signed)
Patient called in requesting referral to  [facility]  Phone [phone]

## 2022-05-18 NOTE — Telephone Encounter (Signed)
Patient called back in regard to previous tele.  Patient states that she is requesting referral for Left leg nerve damage . States that she has discussed this with provider before.  Patent wants a call back in regard.

## 2022-05-31 NOTE — Addendum Note (Signed)
Addended by: Fayrene Helper on: 05/31/2022 03:20 AM   Modules accepted: Orders

## 2022-06-10 ENCOUNTER — Encounter: Payer: Self-pay | Admitting: Radiology

## 2022-06-10 DIAGNOSIS — H524 Presbyopia: Secondary | ICD-10-CM | POA: Diagnosis not present

## 2022-06-25 ENCOUNTER — Encounter: Payer: Self-pay | Admitting: Family Medicine

## 2022-06-25 ENCOUNTER — Ambulatory Visit (INDEPENDENT_AMBULATORY_CARE_PROVIDER_SITE_OTHER): Payer: Medicare HMO | Admitting: Family Medicine

## 2022-06-25 VITALS — BP 138/77 | HR 96 | Ht 61.0 in | Wt 200.1 lb

## 2022-06-25 DIAGNOSIS — M7989 Other specified soft tissue disorders: Secondary | ICD-10-CM | POA: Diagnosis not present

## 2022-06-25 DIAGNOSIS — E038 Other specified hypothyroidism: Secondary | ICD-10-CM | POA: Diagnosis not present

## 2022-06-25 DIAGNOSIS — R7302 Impaired glucose tolerance (oral): Secondary | ICD-10-CM | POA: Diagnosis not present

## 2022-06-25 NOTE — Patient Instructions (Addendum)
F/U in 4 months, call if you need me soner  TSH next Monday   It is important that you exercise regularly at least 30 minutes 5 times a week. If you develop chest pain, have severe difficulty breathing, or feel very tired, stop exercising immediately and seek medical attention   Continue phentermine as before  You are being referred to vascular Specialist re leg swelling

## 2022-06-28 ENCOUNTER — Encounter: Payer: Self-pay | Admitting: Family Medicine

## 2022-06-28 DIAGNOSIS — M7989 Other specified soft tissue disorders: Secondary | ICD-10-CM | POA: Insufficient documentation

## 2022-06-28 DIAGNOSIS — E038 Other specified hypothyroidism: Secondary | ICD-10-CM | POA: Diagnosis not present

## 2022-06-28 MED ORDER — PHENTERMINE HCL 37.5 MG PO TABS: ORAL_TABLET | ORAL | 1 refills | Status: DC

## 2022-06-28 NOTE — Progress Notes (Signed)
Victoria Reyes     MRN: [id number]      DOB: 03-18-1951   HPI Ms. Cardile is here for follow up and re-evaluation of chronic medical conditions, medication management and review of any available recent lab and radiology data.  Preventive health is updated, specifically  Cancer screening and Immunization.   Questions or concerns regarding consultations or procedures which the PT has had in the interim are  addressed. The PT denies any adverse reactions to current medications since the last visit.  C/o chronic balding at crown of head, does not want to do anything about this anymore C/o bilateral leg/ ankle swelling support hiose she is usng helps somes   ROS Denies recent fever or chills. Denies sinus pressure, nasal congestion, ear pain or sore throat. Denies chest congestion, productive cough or wheezing. Denies chest pains, palpitations, pND or orthopnea Denies abdominal pain, nausea, vomiting,diarrhea or constipation.   Denies dysuria, frequency, hesitancy or incontinence. Denies joint pain, swelling and limitation in mobility. Denies headaches, seizures, numbness, or tingling. Denies depression, anxiety or insomnia. Denies skin break down or rash.   PE  BP 138/77 (BP Location: Right Arm, Patient Position: Sitting, Cuff Size: Large)   Pulse 96   Ht 5\' 1"  (1.549 m)   Wt 200 lb 1.9 oz (90.8 kg)   SpO2 92%   BMI 37.81 kg/m   Patient alert and oriented and in no cardiopulmonary distress.  HEENT: No facial asymmetry, EOMI,     Neck supple .  Chest: Clear to auscultation bilaterally.  CVS: S1, S2 no murmurs, no S3.Regular rate.  ABD: Soft non tender.   Ext: one p[us edema bilaterally MS: Adequate ROM spine, shoulders, hips and knees.  Skin: Intact, no ulcerations or rash noted.  Psych: Good eye contact, normal affect. Memory intact not anxious or depressed appearing.  CNS: CN 2-12 intact, power,  normal throughout.no focal deficits noted.   Assessment &  Plan  Hypothyroidism Controlled when last checked Updated lab needed at/ before next visit.   IGT (impaired glucose tolerance) Patient educated about the importance of limiting  Carbohydrate intake , the need to commit to daily physical activity for a minimum of 30 minutes , and to commit weight loss. The fact that changes in all these areas will reduce or eliminate all together the development of diabetes is stressed.      Latest Ref Rng & Units 12/17/2021    9:19 AM 02/18/2021   10:47 AM 02/07/2020    7:25 AM 01/31/2020    6:31 AM 01/30/2020    4:19 AM  Diabetic Labs  Chol 100 - 199 mg/dL 178  183  188     HDL >39 mg/dL 58  58  63     Calc LDL 0 - 99 mg/dL 100  104  104     Triglycerides 0 - 149 mg/dL 110  120  114     Creatinine 0.57 - 1.00 mg/dL 0.73  0.62  0.78  0.66  0.69       06/25/2022   10:48 AM 03/26/2022   11:17 AM 02/19/2022    9:45 AM 02/19/2022    9:23 AM 12/23/2021   10:59 AM 08/19/2021   11:02 AM 03/24/2021    9:42 AM  BP/Weight  Systolic BP 0000000 [id number] 0000000 A999333 [id number] [id number]  Diastolic BP 77 81 80 79 66 80 80  Wt. (Lbs) 200.12 205.12  211 218 217.04   BMI 37.81 kg/m2 38.76 kg/m2  39.87  kg/m2 39.24 kg/m2 41.01 kg/m2        No data to display          Updated lab needed at/ before next visit.   Morbid obesity ([place])  Patient re-educated about  the importance of commitment to a  minimum of 150 minutes of exercise per week as able.  The importance of healthy food choices with portion control discussed, as well as eating regularly and within a 12 hour window most days. The need to choose "clean , green" food 50 to 75% of the time is discussed, as well as to make water the primary drink and set a goal of 64 ounces water daily.       06/25/2022   10:48 AM 03/26/2022   11:17 AM 02/19/2022    9:23 AM  Weight /BMI  Weight 200 lb 1.9 oz 205 lb 1.9 oz 211 lb  Height 5\' 1"  (1.549 m) 5\' 1"  (1.549 m) 5\' 1"  (1.549 m)  BMI 37.81 kg/m2 38.76 kg/m2 39.87 kg/m2     Improved continue phentermine  Leg swelling Leg swelling chronic, refer for eval for PVD

## 2022-06-28 NOTE — Assessment & Plan Note (Signed)
Patient educated about the importance of limiting  Carbohydrate intake , the need to commit to daily physical activity for a minimum of 30 minutes , and to commit weight loss. The fact that changes in all these areas will reduce or eliminate all together the development of diabetes is stressed.      Latest Ref Rng & Units 12/17/2021    9:19 AM 02/18/2021   10:47 AM 02/07/2020    7:25 AM 01/31/2020    6:31 AM 01/30/2020    4:19 AM  Diabetic Labs  Chol 100 - 199 mg/dL 178  183  188     HDL >39 mg/dL 58  58  63     Calc LDL 0 - 99 mg/dL 100  104  104     Triglycerides 0 - 149 mg/dL 110  120  114     Creatinine 0.57 - 1.00 mg/dL 0.73  0.62  0.78  0.66  0.69       06/25/2022   10:48 AM 03/26/2022   11:17 AM 02/19/2022    9:45 AM 02/19/2022    9:23 AM 12/23/2021   10:59 AM 08/19/2021   11:02 AM 03/24/2021    9:42 AM  BP/Weight  Systolic BP 0000000 [id number] 0000000 A999333 [id number] 123456  Diastolic BP 77 81 80 79 66 80 80  Wt. (Lbs) 200.12 205.12  211 218 217.04   BMI 37.81 kg/m2 38.76 kg/m2  39.87 kg/m2 39.24 kg/m2 41.01 kg/m2        No data to display          Updated lab needed at/ before next visit.

## 2022-06-28 NOTE — Assessment & Plan Note (Signed)
Leg swelling chronic, refer for eval for PVD

## 2022-06-28 NOTE — Assessment & Plan Note (Signed)
Controlled when last checked Updated lab needed at/ before next visit.  

## 2022-06-28 NOTE — Assessment & Plan Note (Signed)
  Patient re-educated about  the importance of commitment to a  minimum of 150 minutes of exercise per week as able.  The importance of healthy food choices with portion control discussed, as well as eating regularly and within a 12 hour window most days. The need to choose "clean , green" food 50 to 75% of the time is discussed, as well as to make water the primary drink and set a goal of 64 ounces water daily.       06/25/2022   10:48 AM 03/26/2022   11:17 AM 02/19/2022    9:23 AM  Weight /BMI  Weight 200 lb 1.9 oz 205 lb 1.9 oz 211 lb  Height 5\' 1"  (1.549 m) 5\' 1"  (1.549 m) 5\' 1"  (1.549 m)  BMI 37.81 kg/m2 38.76 kg/m2 39.87 kg/m2    Improved continue phentermine

## 2022-06-29 LAB — TSH: TSH: 0.144 u[IU]/mL — ABNORMAL LOW (ref 0.450–4.500)

## 2022-06-29 MED ORDER — LEVOTHYROXINE SODIUM 88 MCG PO TABS: 88.0000 ug | ORAL_TABLET | Freq: Every day | ORAL | 1 refills | Status: DC

## 2022-06-29 NOTE — Addendum Note (Signed)
Addended by: Fayrene Helper on: 06/29/2022 01:36 PM   Modules accepted: Orders

## 2022-07-19 ENCOUNTER — Other Ambulatory Visit: Payer: Self-pay | Admitting: Family Medicine

## 2022-10-29 ENCOUNTER — Ambulatory Visit (INDEPENDENT_AMBULATORY_CARE_PROVIDER_SITE_OTHER): Payer: Medicare HMO | Admitting: Family Medicine

## 2022-10-29 ENCOUNTER — Encounter: Payer: Self-pay | Admitting: Family Medicine

## 2022-10-29 VITALS — BP 120/70 | HR 100 | Ht 61.0 in | Wt 187.1 lb

## 2022-10-29 DIAGNOSIS — R7989 Other specified abnormal findings of blood chemistry: Secondary | ICD-10-CM

## 2022-10-29 DIAGNOSIS — K219 Gastro-esophageal reflux disease without esophagitis: Secondary | ICD-10-CM | POA: Diagnosis not present

## 2022-10-29 DIAGNOSIS — E038 Other specified hypothyroidism: Secondary | ICD-10-CM | POA: Diagnosis not present

## 2022-10-29 DIAGNOSIS — E785 Hyperlipidemia, unspecified: Secondary | ICD-10-CM | POA: Diagnosis not present

## 2022-10-29 MED ORDER — OMEPRAZOLE 40 MG PO CPDR: 40.0000 mg | DELAYED_RELEASE_CAPSULE | Freq: Every day | ORAL | 5 refills | Status: DC

## 2022-10-29 NOTE — Patient Instructions (Addendum)
F/U in 12 weeks, call if you need me sooner  Annual exam 12/16 or after  Congrats on excellent weight loss , keep it up, no change in med dose  Non fasting TSH on 11/01/2022  It is important that you exercise regularly at least 30 minutes 5 times a week. If you develop chest pain, have severe difficulty breathing, or feel very tired, stop exercising immediately and seek medical attention   Thanks for choosing [facility], we consider it a privelige to serve you.

## 2022-11-01 DIAGNOSIS — R7989 Other specified abnormal findings of blood chemistry: Secondary | ICD-10-CM | POA: Insufficient documentation

## 2022-11-01 DIAGNOSIS — E038 Other specified hypothyroidism: Secondary | ICD-10-CM | POA: Diagnosis not present

## 2022-11-01 NOTE — Assessment & Plan Note (Signed)
Excellent response to phentermine , continue same  Patient re-educated about  the importance of commitment to a  minimum of 150 minutes of exercise per week as able.  The importance of healthy food choices with portion control discussed, as well as eating regularly and within a 12 hour window most days. The need to choose "clean , green" food 50 to 75% of the time is discussed, as well as to make water the primary drink and set a goal of 64 ounces water daily.       10/29/2022   10:44 AM 06/25/2022   10:48 AM 03/26/2022   11:17 AM  Weight /BMI  Weight 187 lb 1.9 oz 200 lb 1.9 oz 205 lb 1.9 oz  Height 5\' 1"  (1.549 m) 5\' 1"  (1.549 m) 5\' 1"  (1.549 m)  BMI 35.36 kg/m2 37.81 kg/m2 38.76 kg/m2

## 2022-11-01 NOTE — Assessment & Plan Note (Signed)
Continue daily supplement 

## 2022-11-01 NOTE — Assessment & Plan Note (Signed)
Uncontrolled , resume daily PPI, and avoid foods which trigger symptoms

## 2022-11-01 NOTE — Assessment & Plan Note (Signed)
Updated lab needed.  

## 2022-11-01 NOTE — Progress Notes (Signed)
   Victoria Reyes     MRN: [id number]      DOB: 30-Apr-1950  Chief Complaint  Patient presents with   Follow-up    Follow up requesting acid reflux medication    HPI Victoria Reyes is here for follow up and re-evaluation of chronic medical conditions, medication management and review of any available recent lab and radiology data.  Preventive health is updated, specifically  Cancer screening and Immunization.    The PT denies any adverse reactions to current medications since the last visit. Losing a significant amount of weight with food choice and portion size change, TSH level needs to be updated C/o uncontrolled reflux symptoms, and requests resumptioon of pPI, denies dysphagia, she does not smoke and she avoids caffeine   ROS Denies recent fever or chills. Denies sinus pressure, nasal congestion, ear pain or sore throat. Denies chest congestion, productive cough or wheezing. Denies chest pains, palpitations and leg swelling Denies abdominal pain, nausea, vomiting,diarrhea or constipation.   Denies dysuria, frequency, hesitancy or incontinence. Denies joint pain, swelling and limitation in mobility. Denies headaches, seizures, numbness, or tingling. Denies depression, anxiety or insomnia. Denies skin break down or rash.   PE  BP 120/70   Pulse 100   Ht 5\' 1"  (1.549 m)   Wt 187 lb 1.9 oz (84.9 kg)   SpO2 98%   BMI 35.36 kg/m   Patient alert and oriented and in no cardiopulmonary distress.  HEENT: No facial asymmetry, EOMI,     Neck supple .  Chest: Clear to auscultation bilaterally.  CVS: S1, S2 no murmurs, no S3.Regular rate.  ABD: Soft non tender.   Ext: No edema  MS: Adequate ROM spine, shoulders, hips and knees.  Skin: Intact, no ulcerations or rash noted.  Psych: Good eye contact, normal affect. Memory intact not anxious or depressed appearing.  CNS: CN 2-12 intact, power,  normal throughout.no focal deficits noted.   Assessment &  Plan  Hypothyroidism Updated lab needed   GERD (gastroesophageal reflux disease) Uncontrolled , resume daily PPI, and avoid foods which trigger symptoms  Morbid obesity (HCC) Excellent response to phentermine , continue same  Patient re-educated about  the importance of commitment to a  minimum of 150 minutes of exercise per week as able.  The importance of healthy food choices with portion control discussed, as well as eating regularly and within a 12 hour window most days. The need to choose "clean , green" food 50 to 75% of the time is discussed, as well as to make water the primary drink and set a goal of 64 ounces water daily.       10/29/2022   10:44 AM 06/25/2022   10:48 AM 03/26/2022   11:17 AM  Weight /BMI  Weight 187 lb 1.9 oz 200 lb 1.9 oz 205 lb 1.9 oz  Height 5\' 1"  (1.549 m) 5\' 1"  (1.549 m) 5\' 1"  (1.549 m)  BMI 35.36 kg/m2 37.81 kg/m2 38.76 kg/m2      Hyperlipidemia with target LDL less than 100 Hyperlipidemia:Low fat diet discussed and encouraged.   Lipid Panel  Lab Results  Component Value Date   CHOL 178 12/17/2021   HDL 58 12/17/2021   LDLCALC 100 (H) 12/17/2021   TRIG 110 12/17/2021   CHOLHDL 3.1 12/17/2021     Needs to reduce fat intake  Low vitamin D level Continue daily supplement

## 2022-11-01 NOTE — Assessment & Plan Note (Signed)
Hyperlipidemia:Low fat diet discussed and encouraged.   Lipid Panel  Lab Results  Component Value Date   CHOL 178 12/17/2021   HDL 58 12/17/2021   LDLCALC 100 (H) 12/17/2021   TRIG 110 12/17/2021   CHOLHDL 3.1 12/17/2021     Needs to reduce fat intake

## 2022-11-02 ENCOUNTER — Other Ambulatory Visit: Payer: Self-pay | Admitting: Family Medicine

## 2022-11-02 ENCOUNTER — Other Ambulatory Visit: Payer: Self-pay

## 2022-11-02 DIAGNOSIS — E038 Other specified hypothyroidism: Secondary | ICD-10-CM

## 2022-11-02 LAB — TSH: TSH: 0.137 u[IU]/mL — ABNORMAL LOW (ref 0.450–4.500)

## 2022-11-26 ENCOUNTER — Other Ambulatory Visit (INDEPENDENT_AMBULATORY_CARE_PROVIDER_SITE_OTHER): Payer: Medicare HMO

## 2022-11-26 ENCOUNTER — Encounter: Payer: Self-pay | Admitting: Orthopedic Surgery

## 2022-11-26 ENCOUNTER — Ambulatory Visit: Payer: Medicare HMO | Admitting: Orthopedic Surgery

## 2022-11-26 DIAGNOSIS — M1711 Unilateral primary osteoarthritis, right knee: Secondary | ICD-10-CM | POA: Diagnosis not present

## 2022-11-26 NOTE — Progress Notes (Signed)
Office Visit Note   Patient: Victoria Reyes           Date of Birth: 12-22-1950           MRN: [id number] Visit Date: 11/26/2022 Requested by: Kerri Perches, MD [address] [place],  [place] [address] PCP: Kerri Perches, MD  Subjective: Chief Complaint  Patient presents with   Right Knee - Pain    HPI: Victoria Reyes is a 72 y.o. female who presents to the office reporting continued severe right knee pain.  She had an injection done 11/30/2021 which did not give her much relief.  There is old radiographs are reviewed and she does have end-stage tricompartmental arthritis.  She wants to discuss surgery.  Reports weakness and stiff knees more on the right than the left.  Describes constant pain.  Hard for her to sleep and she reports decreased walking and standing endurance.  She lives with her daughter.  Has 4 steps to get into the house.  No personal or family history of DVT or pulmonary embolism.  No prior right knee surgery..                ROS: All systems reviewed are negative as they relate to the chief complaint within the history of present illness.  Patient denies fevers or chills.  Assessment & Plan: Visit Diagnoses:  1. Primary osteoarthritis of right knee     Plan: Impression is end-stage arthritis of the right knee.  Had a long talk today about total knee replacement in the difficulty of the rehabilitation for the first 4 to 6 weeks.  Patient understands risk and benefits including but not limited to infection or vessel damage knee stiffness incomplete pain relief as well as incomplete restoration of function.  She would like to proceed.  All questions answered.  Follow-Up Instructions: No follow-ups on file.   Orders:  Orders Placed This Encounter  Procedures   XR KNEE 3 VIEW RIGHT   No orders of the defined types were placed in this encounter.     Procedures: No procedures performed   Clinical Data: No additional  findings.  Objective: Vital Signs: There were no vitals taken for this visit.  Physical Exam:  Constitutional: Patient appears well-developed HEENT:  Head: Normocephalic Eyes:EOM are normal Neck: Normal range of motion Cardiovascular: Normal rate Pulmonary/chest: Effort normal Neurologic: Patient is alert Skin: Skin is warm Psychiatric: Patient has normal mood and affect  Ortho Exam: Ortho exam demonstrates range of motion on the right of 10 degrees to 100.  Collateral cruciate ligaments are stable.  Extensor mechanism intact.  Pedal pulses palpable 2+ out of 4.  No masses lymphadenopathy or skin changes noted in that right knee region.  No nerve root tension signs.  No groin pain with internal or external rotation of that right leg.  Specialty Comments:  No specialty comments available.  Imaging: No results found.   PMFS History: Patient Active Problem List   Diagnosis Date Noted   Low vitamin D level 11/01/2022   Leg swelling 06/28/2022   Giardiasis 08/23/2021   Tapeworm infection 08/23/2021   Leg pain, left 11/17/2020   Alopecia 05/06/2020   History of Helicobacter pylori infection 04/09/2020   Anemia 04/09/2020   History of adenomatous polyp of colon 04/09/2020   Gastric ulcer 01/29/2020   Duodenal ulcer 01/29/2020   Knee pain, right 11/08/2019   Sacro ilial pain 11/08/2019   Chronic back pain 09/11/2019  Immunization refused 03/11/2019   Encounter for annual physical exam 07/10/2015   Cutaneous wart 09/30/2014   Hyperlipidemia with target LDL less than 100 05/04/2012   GERD (gastroesophageal reflux disease) 03/14/2011   Chronic right-sided low back pain with right-sided sciatica 06/25/2009   Morbid obesity (HCC) 02/17/2007   Hypothyroidism 06/14/2006   DIVERTICULOSIS, COLON 03/01/2006   HIATAL HERNIA WITH REFLUX, HX OF 03/01/2006   Past Medical History:  Diagnosis Date   Anemia    as a  young woman   Diverticulitis 2005   CT    DJD (degenerative joint  disease)    GERD (gastroesophageal reflux disease)    H. pylori infection 01/2020   S/p treatment with omeprazole, amoxicillin, and Biaxin.   Headache(784.0)    Hiatal hernia    HTN (hypertension) 01/01/2013   Hyperlipidemia    Hypertension    Hypothyroidism    Knee pain, right    Low back pain    Neuropathy    peroneal nerve   Obesity    Pneumonia    as child   S/P colonoscopy 2005   Dr. Katrinka Blazing: sigmoid diverticulosis   Thyroid disease    Tobacco abuse     Family History  Problem Relation Age of Onset   Heart attack Sister        pacemaker   Kidney failure Sister        on dialysis   Colon cancer Neg Hx    Stomach cancer Neg Hx    Esophageal cancer Neg Hx     Past Surgical History:  Procedure Laterality Date   BACK SURGERY     BIOPSY  01/29/2020   Procedure: BIOPSY;  Surgeon: Dolores Frame, MD;  Location: [facility];  Service: Gastroenterology;;   BIOPSY  05/13/2020   Procedure: BIOPSY;  Surgeon: Lanelle Bal, DO;  Location: [facility] ENDOSCOPY;  Service: Endoscopy;;   COLONOSCOPY  12/18/2010   Procedure: COLONOSCOPY;  Surgeon: Arlyce Harman, MD; 3 mm sessile polyp at hepatic flexure, pancolonic diverticulosis, internal hemorrhoids, anal lesion just above the dentate line biopsied via cold forceps.  Colonic polyp was a tubular adenoma.  Anus biopsy with mild active inflammation associated with reactive epithelial changes and hemorrhage.  Recommended repeat colonoscopy in 2022.   COLONOSCOPY WITH PROPOFOL N/A 05/13/2020   Procedure: COLONOSCOPY WITH PROPOFOL;  Surgeon: Lanelle Bal, DO;  Location: [facility] ENDOSCOPY;  Service: Endoscopy;  Laterality: N/A;  12:30PM   cyst removal right wrist     ESOPHAGOGASTRODUODENOSCOPY (EGD) WITH PROPOFOL N/A 01/29/2020   Procedure: ESOPHAGOGASTRODUODENOSCOPY (EGD) WITH PROPOFOL;  Surgeon: Marguerita Merles, Reuel Boom, MD;   3 gastric ulcers with clean base, 5 ulcers in the duodenal bulb and first portion of duodenum, 2 were noted to  be oozing, treated with bipolar cautery.  Gastric biopsies consistent with H. pylori gastritis.   ESOPHAGOGASTRODUODENOSCOPY (EGD) WITH PROPOFOL N/A 05/13/2020   Procedure: ESOPHAGOGASTRODUODENOSCOPY (EGD) WITH PROPOFOL;  Surgeon: Lanelle Bal, DO;  Location: [facility] ENDOSCOPY;  Service: Endoscopy;  Laterality: N/A;   EYE SURGERY Bilateral    cataract surgery with lens implant   POLYPECTOMY  05/13/2020   Procedure: POLYPECTOMY;  Surgeon: Lanelle Bal, DO;  Location: [facility] ENDOSCOPY;  Service: Endoscopy;;   TONSILLECTOMY     TOTAL SHOULDER ARTHROPLASTY Left 12/21/2016   Procedure: TOTAL SHOULDER ARTHROPLASTY;  Surgeon: Cammy Copa, MD;  Location: [facility] OR;  Service: Orthopedics;  Laterality: Left;   TUBAL LIGATION     umbillical hernia  Social History   Occupational History   Not on file  Tobacco Use   Smoking status: Former    Current packs/day: 0.00    Average packs/day: 0.3 packs/day for 51.9 years (13.0 ttl pk-yrs)    Types: Cigarettes    Start date: 09/30/1963    Quit date: 08/15/2015    Years since quitting: 7.2   Smokeless tobacco: Never  Vaping Use   Vaping status: Never Used  Substance and Sexual Activity   Alcohol use: No    Alcohol/week: 0.0 standard drinks of alcohol   Drug use: No   Sexual activity: Not Currently    Birth control/protection: Post-menopausal

## 2022-12-02 ENCOUNTER — Telehealth: Payer: Self-pay | Admitting: Orthopedic Surgery

## 2022-12-02 NOTE — Telephone Encounter (Signed)
Returned patient's call about scheduling right total knee with Dr August Saucer.  Patient did not answer.  Message left on patient's voicemail, providing my name and direct number for scheduling.

## 2022-12-07 ENCOUNTER — Other Ambulatory Visit: Payer: Self-pay

## 2022-12-15 DIAGNOSIS — E038 Other specified hypothyroidism: Secondary | ICD-10-CM | POA: Diagnosis not present

## 2022-12-16 NOTE — Pre-Procedure Instructions (Signed)
Victoria Reyes  12/16/2022    Surgical Instructions   Your procedure is scheduled on Tuesday, September 17. Report to [facility] Main Entrance "A" at 9:25 AM , then check in with the Admitting office. Any questions or running late day of surgery: call [phone]- this is the pre- surgery desk.  Questions prior to your surgery date: call [phone], Monday-Friday, 8am-4pm, ask for any nurse. If you experience any cold or flu symptoms such as cough, fever, chills, shortness of breath, etc. between now and your scheduled surgery, please notify us at the above number.     Remember:  Do not eat after midnight the night before your surgery  You may drink clear liquids until 8:25 AM the morning of your surgery.   Clear liquids allowed are: Water, Non-Citrus Juices (without pulp), Carbonated Beverages, Clear Tea, Black Coffee Only (NO MILK, CREAM OR POWDERED CREAMER of any kind), and Gatorade.  Drink the Pre- Surgery Ensure between 7:45 AM and 8:25 AM, then nothing else to drink. Take these medicines the morning of surgery with A SIP OF WATER : levothyroxine (SYNTHROID)    May take these medicines IF NEEDED: omeprazole (PRILOSEC)    One week prior to surgery, STOP taking any Aspirin (unless otherwise instructed by your surgeon) Aleve, Naproxen, Ibuprofen, Motrin, Advil, Goody's, BC's, all herbal medications, fish oil, and non-prescription vitamins.             Pre-operative CHG Bathing Instructions   You can play a key role in reducing the risk of infection after surgery. Your skin needs to be as free of germs as possible. You can reduce the number of germs on your skin by washing with CHG (chlorhexidine gluconate) soap before surgery. CHG is an antiseptic soap that kills germs and continues to kill germs even after washing.   DO NOT use if you have an allergy to chlorhexidine/CHG or antibacterial soaps. If your skin becomes reddened or irritated, stop using the CHG and notify  one of our RNs at [phone].              TAKE A SHOWER THE NIGHT BEFORE SURGERY AND THE DAY OF SURGERY    Please keep in mind the following:  DO NOT shave, including legs and underarms, 48 hours prior to surgery.   You may shave your face before/day of surgery.  Place clean sheets on your bed the night before surgery Use a clean washcloth (not used since being washed) for each shower. DO NOT sleep with pet's night before surgery.  CHG Shower Instructions:  If you choose to wash your hair and private area, wash first with your normal shampoo/soap.  After you use shampoo/soap, rinse your hair and body thoroughly to remove shampoo/soap residue.  Turn the water OFF and apply half the bottle of CHG soap to a CLEAN washcloth.  Apply CHG soap ONLY FROM YOUR NECK DOWN TO YOUR TOES (washing for 3-5 minutes)  DO NOT use CHG soap on face, private areas, open wounds, or sores.  Pay special attention to the area where your surgery is being performed.  If you are having back surgery, having someone wash your back for you may be helpful. Wait 2 minutes after CHG soap is applied, then you may rinse off the CHG soap.  Pat dry with a clean towel  Put on clean pajamas    Additional instructions for the day of surgery: DO NOT APPLY any lotions, deodorants, cologne, or perfumes.   Do  not wear jewelry or makeup Do not wear nail polish, gel polish, artificial nails, or any other type of covering on natural nails (fingers and toes) Do not bring valuables to the hospital. [facility] is not responsible for valuables/personal belongings. Put on clean/comfortable clothes.  Please brush your teeth.  Ask your nurse before applying any prescription medications to the skin.   Do NOT Smoke (Tobacco/Vaping) for 24 hours prior to your procedure.  If you use a CPAP at night, you may bring your mask/headgear for your overnight stay.   You will be asked to remove any contacts, glasses, piercing's, hearing aid's,  dentures/partials prior to surgery. Please bring cases for these items if needed.    Patients discharged the day of surgery will not be allowed to drive home, and someone needs to stay with them for 24 hours.  SURGICAL WAITING ROOM VISITATION Patients may have no more than 2 support people in the waiting area - these visitors may rotate.   Pre-op nurse will coordinate an appropriate time for 1 ADULT support person, who may not rotate, to accompany patient in pre-op.  Children under the age of 32 must have an adult with them who is not the patient and must remain in the main waiting area with an adult.  If the patient needs to stay at the hospital during part of their recovery, the visitor guidelines for inpatient rooms apply.  Please refer to the [facility] website for the visitor guidelines for any additional information.

## 2022-12-17 ENCOUNTER — Other Ambulatory Visit: Payer: Self-pay

## 2022-12-17 ENCOUNTER — Encounter (HOSPITAL_COMMUNITY): Payer: Self-pay

## 2022-12-17 ENCOUNTER — Encounter (HOSPITAL_COMMUNITY)
Admission: RE | Admit: 2022-12-17 | Discharge: 2022-12-17 | Disposition: A | Discharge status: Home / Self Care | Payer: Medicare HMO | Source: Ambulatory Visit | Attending: Orthopedic Surgery

## 2022-12-17 VITALS — BP 159/70 | HR 90 | Temp 98.3°F | Resp 18 | Ht 61.5 in | Wt 200.9 lb

## 2022-12-17 DIAGNOSIS — Z01812 Encounter for preprocedural laboratory examination: Secondary | ICD-10-CM | POA: Insufficient documentation

## 2022-12-17 DIAGNOSIS — Z01818 Encounter for other preprocedural examination: Secondary | ICD-10-CM | POA: Diagnosis present

## 2022-12-17 LAB — BASIC METABOLIC PANEL
Anion gap: 10 (ref 5–15)
BUN: 14 mg/dL (ref 8–23)
CO2: 22 mmol/L (ref 22–32)
Calcium: 8.7 mg/dL — ABNORMAL LOW (ref 8.9–10.3)
Chloride: 108 mmol/L (ref 98–111)
Creatinine, Ser: 0.8 mg/dL (ref 0.44–1.00)
GFR, Estimated: >60 mL/min (ref >60)
Glucose, Bld: 81 mg/dL (ref 70–99)
Potassium: 4 mmol/L (ref 3.5–5.1)
Sodium: 140 mmol/L (ref 135–145)

## 2022-12-17 LAB — CBC
HCT: 40.9 % (ref 36.0–46.0)
Hemoglobin: 12.9 g/dL (ref 12.0–15.0)
MCH: 28.5 pg (ref 26.0–34.0)
MCHC: 31.5 g/dL (ref 30.0–36.0)
MCV: 90.5 fL (ref 80.0–100.0)
Platelets: 288 10*3/uL (ref 150–400)
RBC: 4.52 MIL/uL (ref 3.87–5.11)
RDW: 15.2 % (ref 11.5–15.5)
WBC: 9.3 10*3/uL (ref 4.0–10.5)
nRBC: 0 % (ref 0.0–0.2)

## 2022-12-17 LAB — SURGICAL PCR SCREEN
MRSA, PCR: NEGATIVE
Staphylococcus aureus: NEGATIVE

## 2022-12-17 LAB — URINALYSIS, W/ REFLEX TO CULTURE (INFECTION SUSPECTED)
Bilirubin Urine: NEGATIVE
Glucose, UA: NEGATIVE mg/dL
Ketones, ur: NEGATIVE mg/dL
Leukocytes,Ua: NEGATIVE
Nitrite: NEGATIVE
Protein, ur: NEGATIVE mg/dL
Specific Gravity, Urine: 1.011 (ref 1.005–1.030)
pH: 5 (ref 5.0–8.0)

## 2022-12-17 NOTE — Progress Notes (Addendum)
PCP - Dr. Syliva Overman  EP-no  Endocrine-no  Pulm-no  Chest x-ray - na  EKG - patient denies having HTN, I read it in PCP.  Will get EKG on DOS.  Stress Test - Never  ECHO - never  Cardiac Cath - never  AICD-no PM-no  Nerve Stimulator-no  Dialysis-no  Sleep Study - no CPAP - no  LABS-CBC, BMP, UA/MIcro, PCR Urine  < 5 WBC, many bacteria, negative Leukocyes. ASA-no  ERAS-yes  HA1C-na GLP-1-na Fasting Blood Sugar - na Checks Blood Sugar _na____ times a day  Anesthesia-  Pt denies having chest pain, sob, or fever at this time. All instructions explained to the pt, with a verbal understanding of the material. Pt agrees to go over the instructions while at home for a better understanding. The opportunity to ask questions was provided.

## 2022-12-17 NOTE — Progress Notes (Signed)
Victoria Reyes  Surgical Instructions   Your procedure is scheduled on Tuesday, 17 th. Report to [facility] Main Entrance "A" at 9:45 AM then check in with the Admitting office. Any questions or running late day of surgery: call [phone]  Questions prior to your surgery date: call [phone], Monday-Friday, 8am-4pm. If you experience any cold or flu symptoms such as cough, fever, chills, shortness of breath, etc. between now and your scheduled surgery, please notify us at the above number.     Remember:  Do not eat after midnight the night before your surgery  You may drink clear liquids until 8:25 AM the morning of your surgery.   Clear liquids allowed are: Water, Non-Citrus Juices (without pulp), Carbonated Beverages, Clear Tea, Black Coffee Only (NO MILK, CREAM OR POWDERED CREAMER of any kind), and Gatorade. Drink the pre surgery Ensure you were given, between 7:45 AM and 8:25 AM   Take these medicines the morning of surgery with A SIP OF WATER: Levothyroxine   May take these medicines IF NEEDED: Omeprazole   One week prior to surgery, STOP taking any Aspirin (unless otherwise instructed by your surgeon) Aleve, Naproxen, Ibuprofen, Motrin, Advil, Goody's, BC's, all herbal medications, fish oil, and non-prescription vitamins.                Pre-operative 5 CHG Bathing Instructions   You can play a key role in reducing the risk of infection after surgery. Your skin needs to be as free of germs as possible. You can reduce the number of germs on your skin by washing with CHG (chlorhexidine gluconate) soap before surgery. CHG is an antiseptic soap that kills germs and continues to kill germs even after washing.   DO NOT use if you have an allergy to chlorhexidine/CHG or antibacterial soaps. If your skin becomes reddened or irritated, stop using the CHG and notify one of our RNs at [phone].   Please shower with the CHG soap starting 4 days before surgery using the  following schedule:     Please keep in mind the following:  DO NOT shave, including legs and underarms, starting the day of your first shower.   You may shave your face at any point before/day of surgery.  Place clean sheets on your bed the day you start using CHG soap. Use a clean washcloth (not used since being washed) for each shower. DO NOT sleep with pets once you start using the CHG.   CHG Shower Instructions:  If you choose to wash your hair and private area, wash first with your normal shampoo/soap.  After you use shampoo/soap, rinse your hair and body thoroughly to remove shampoo/soap residue.  Turn the water OFF and apply about 3 tablespoons (45 ml) of CHG soap to a CLEAN washcloth.  Apply CHG soap ONLY FROM YOUR NECK DOWN TO YOUR TOES (washing for 3-5 minutes)  DO NOT use CHG soap on face, private areas, open wounds, or sores.  Pay special attention to the area where your surgery is being performed.  If you are having back surgery, having someone wash your back for you may be helpful. Wait 2 minutes after CHG soap is applied, then you may rinse off the CHG soap.  Pat dry with a clean towel  Put on clean clothes/pajamas   If you choose to wear lotion, please use ONLY the CHG-compatible lotions on the back of this paper.   Additional instructions for the day of surgery: DO NOT APPLY any lotions, deodorants,  cologne, or perfumes.   Do not bring valuables to the hospital. [facility] is not responsible for any belongings/valuables. Do not wear nail polish, gel polish, artificial nails, or any other type of covering on natural nails (fingers and toes) Do not wear jewelry or makeup Put on clean/comfortable clothes.  Please brush your teeth.  Ask your nurse before applying any prescription medications to the skin.     CHG Compatible Lotions   Aveeno Moisturizing lotion  Cetaphil Moisturizing Cream  Cetaphil Moisturizing Lotion  Clairol Herbal Essence Moisturizing Lotion,  Dry Skin  Clairol Herbal Essence Moisturizing Lotion, Extra Dry Skin  Clairol Herbal Essence Moisturizing Lotion, Normal Skin  Curel Age Defying Therapeutic Moisturizing Lotion with Alpha Hydroxy  Curel Extreme Care Body Lotion  Curel Soothing Hands Moisturizing Hand Lotion  Curel Therapeutic Moisturizing Cream, Fragrance-Free  Curel Therapeutic Moisturizing Lotion, Fragrance-Free  Curel Therapeutic Moisturizing Lotion, Original Formula  Eucerin Daily Replenishing Lotion  Eucerin Dry Skin Therapy Plus Alpha Hydroxy Crme  Eucerin Dry Skin Therapy Plus Alpha Hydroxy Lotion  Eucerin Original Crme  Eucerin Original Lotion  Eucerin Plus Crme Eucerin Plus Lotion  Eucerin TriLipid Replenishing Lotion  Keri Anti-Bacterial Hand Lotion  Keri Deep Conditioning Original Lotion Dry Skin Formula Softly Scented  Keri Deep Conditioning Original Lotion, Fragrance Free Sensitive Skin Formula  Keri Lotion Fast Absorbing Fragrance Free Sensitive Skin Formula  Keri Lotion Fast Absorbing Softly Scented Dry Skin Formula  Keri Original Lotion  Keri Skin Renewal Lotion Keri Silky Smooth Lotion  Keri Silky Smooth Sensitive Skin Lotion  Nivea Body Creamy Conditioning Oil  Nivea Body Extra Enriched Lotion  Nivea Body Original Lotion  Nivea Body Sheer Moisturizing Lotion Nivea Crme  Nivea Skin Firming Lotion  NutraDerm 30 Skin Lotion  NutraDerm Skin Lotion  NutraDerm Therapeutic Skin Cream  NutraDerm Therapeutic Skin Lotion  ProShield Protective Hand Cream  Provon moisturizing lotion  Please read over the following fact sheets that you were given.  ou may drink clear liquids until 8:25 AM, the morning of your surgery.   Clear liquids allowed are: Water, Non-Citrus Juices (without pulp), Carbonated Beverages, Clear Tea, Black Coffee Only (NO MILK, CREAM OR POWDERED CREAMER of any kind), and Gatorade.    Take these medicines the morning of surgery with A SIP OF WATER    May take these medicines IF  NEEDED:    One week prior to surgery, STOP taking any Aspirin (unless otherwise instructed by your surgeon) Aleve, Naproxen, Ibuprofen, Motrin, Advil, Goody's, BC's, all herbal medications, fish oil, and non-prescription vitamins.                     Do NOT Smoke (Tobacco/Vaping) for 24 hours prior to your procedure.  If you use a CPAP at night, you may bring your mask/headgear for your overnight stay.   You will be asked to remove any contacts, glasses, piercing's, hearing aid's, dentures/partials prior to surgery. Please bring cases for these items if needed.    Patients discharged the day of surgery will not be allowed to drive home, and someone needs to stay with them for 24 hours.  SURGICAL WAITING ROOM VISITATION Patients may have no more than 2 support people in the waiting area - these visitors may rotate.   Pre-op nurse will coordinate an appropriate time for 1 ADULT support person, who may not rotate, to accompany patient in pre-op.  Children under the age of 85 must have an adult with them who is not the  patient and must remain in the main waiting area with an adult.  If the patient needs to stay at the hospital during part of their recovery, the visitor guidelines for inpatient rooms apply.  Please refer to the [facility] website for the visitor guidelines for any additional information.   If you received a COVID test during your pre-op visit  it is requested that you wear a mask when out in public, stay away from anyone that may not be feeling well and notify your surgeon if you develop symptoms. If you have been in contact with anyone that has tested positive in the last 10 days please notify you surgeon.

## 2022-12-20 ENCOUNTER — Other Ambulatory Visit: Payer: Self-pay | Admitting: Family Medicine

## 2022-12-20 MED ORDER — LEVOTHYROXINE SODIUM 88 MCG PO TABS: 88.0000 ug | ORAL_TABLET | Freq: Every day | ORAL | 1 refills | Status: DC

## 2022-12-27 ENCOUNTER — Other Ambulatory Visit: Payer: Self-pay | Admitting: *Deleted

## 2022-12-27 ENCOUNTER — Telehealth: Payer: Self-pay | Admitting: *Deleted

## 2022-12-27 DIAGNOSIS — M1711 Unilateral primary osteoarthritis, right knee: Secondary | ICD-10-CM

## 2022-12-27 MED ORDER — TRANEXAMIC ACID 1000 MG/10ML IV SOLN
2000.0000 mg | INTRAVENOUS | Status: AC
  Filled 2022-12-27 (×3): qty 20

## 2022-12-27 NOTE — Care Plan (Signed)
[facility] RNCM call to patient to discuss her upcoming Right total knee arthroplasty with Dr. August Saucer on 12/28/22. She is an [facility] patient and is agreeable to case management. She plans to return home after discharge and has her children that will be assisting her. Anticipate HHPT will be needed after a short hospital stay. Referral made to [facility] after choice provided. Referral made to [employer] for CPM and walker prior to surgery, but they have been unable to reach patient. They will arrange CPM delivery after surgery since surgery is tomorrow. RW will need to be provided by another company prior to discharge. Reviewed post op care instructions. Will continue to follow for needs.

## 2022-12-27 NOTE — Telephone Encounter (Signed)
[facility] pre-op call completed. 

## 2022-12-28 ENCOUNTER — Ambulatory Visit (HOSPITAL_COMMUNITY): Payer: Medicare HMO | Admitting: Anesthesiology

## 2022-12-28 ENCOUNTER — Encounter (HOSPITAL_COMMUNITY)
Admission: RE | Disposition: A | Discharge status: Home / Self Care | Payer: Self-pay | Source: Home / Self Care | Attending: Orthopedic Surgery

## 2022-12-28 ENCOUNTER — Other Ambulatory Visit: Payer: Self-pay

## 2022-12-28 ENCOUNTER — Observation Stay (HOSPITAL_COMMUNITY)
Admission: RE | Admit: 2022-12-28 | Discharge: 2022-12-29 | Disposition: A | Discharge status: Home / Self Care | Payer: Medicare HMO | Attending: Orthopedic Surgery | Admitting: Orthopedic Surgery

## 2022-12-28 ENCOUNTER — Encounter (HOSPITAL_COMMUNITY): Payer: Self-pay | Admitting: Orthopedic Surgery

## 2022-12-28 DIAGNOSIS — Z87891 Personal history of nicotine dependence: Secondary | ICD-10-CM | POA: Insufficient documentation

## 2022-12-28 DIAGNOSIS — M1711 Unilateral primary osteoarthritis, right knee: Secondary | ICD-10-CM | POA: Diagnosis not present

## 2022-12-28 DIAGNOSIS — E039 Hypothyroidism, unspecified: Secondary | ICD-10-CM | POA: Diagnosis not present

## 2022-12-28 DIAGNOSIS — Z96642 Presence of left artificial hip joint: Secondary | ICD-10-CM | POA: Insufficient documentation

## 2022-12-28 DIAGNOSIS — E785 Hyperlipidemia, unspecified: Secondary | ICD-10-CM | POA: Diagnosis not present

## 2022-12-28 DIAGNOSIS — Z96651 Presence of right artificial knee joint: Secondary | ICD-10-CM

## 2022-12-28 DIAGNOSIS — E559 Vitamin D deficiency, unspecified: Secondary | ICD-10-CM | POA: Insufficient documentation

## 2022-12-28 DIAGNOSIS — G8918 Other acute postprocedural pain: Secondary | ICD-10-CM | POA: Diagnosis not present

## 2022-12-28 DIAGNOSIS — Z01818 Encounter for other preprocedural examination: Principal | ICD-10-CM

## 2022-12-28 DIAGNOSIS — M179 Osteoarthritis of knee, unspecified: Secondary | ICD-10-CM | POA: Diagnosis present

## 2022-12-28 HISTORY — PX: TOTAL KNEE ARTHROPLASTY: SHX125

## 2022-12-28 LAB — VITAMIN D 25 HYDROXY (VIT D DEFICIENCY, FRACTURES): Vit D, 25-Hydroxy: 36.84 ng/mL (ref 30–100)

## 2022-12-28 SURGERY — ARTHROPLASTY, KNEE, TOTAL
Anesthesia: Spinal | Site: Knee | Laterality: Right

## 2022-12-28 MED ORDER — ONDANSETRON HCL 4 MG/2ML IJ SOLN
INTRAMUSCULAR | Status: DC | PRN
  Administered 2022-12-28: 4 mg via INTRAVENOUS

## 2022-12-28 MED ORDER — OXYCODONE HCL 5 MG PO TABS
5.0000 mg | ORAL_TABLET | ORAL | Status: DC | PRN
  Filled 2022-12-28: qty 2

## 2022-12-28 MED ORDER — CHLORHEXIDINE GLUCONATE 0.12 % MT SOLN: 15.0000 mL | Freq: Once | OROMUCOSAL | Status: AC

## 2022-12-28 MED ORDER — TRANEXAMIC ACID 1000 MG/10ML IV SOLN
INTRAVENOUS | Status: DC | PRN
  Administered 2022-12-28: 2000 mg via TOPICAL

## 2022-12-28 MED ORDER — MIDAZOLAM HCL 2 MG/2ML IJ SOLN
INTRAMUSCULAR | Status: AC
  Filled 2022-12-28: qty 2

## 2022-12-28 MED ORDER — PROPOFOL 500 MG/50ML IV EMUL
INTRAVENOUS | Status: DC | PRN
  Administered 2022-12-28: 75 ug/kg/min via INTRAVENOUS

## 2022-12-28 MED ORDER — VANCOMYCIN HCL 1000 MG IV SOLR
INTRAVENOUS | Status: AC
  Filled 2022-12-28: qty 20

## 2022-12-28 MED ORDER — METHOCARBAMOL 500 MG PO TABS
500.0000 mg | ORAL_TABLET | Freq: Four times a day (QID) | ORAL | Status: DC | PRN
  Administered 2022-12-28 – 2022-12-29 (×2): 500 mg via ORAL
  Filled 2022-12-28 (×2): qty 1

## 2022-12-28 MED ORDER — LACTATED RINGERS IV SOLN: INTRAVENOUS | Status: DC

## 2022-12-28 MED ORDER — BUPIVACAINE-EPINEPHRINE (PF) 0.5% -1:200000 IJ SOLN
INTRAMUSCULAR | Status: DC | PRN
  Administered 2022-12-28: 20 mL via PERINEURAL

## 2022-12-28 MED ORDER — BUPIVACAINE HCL (PF) 0.25 % IJ SOLN
INTRAMUSCULAR | Status: AC
  Filled 2022-12-28: qty 30

## 2022-12-28 MED ORDER — HYDROMORPHONE HCL 2 MG PO TABS
1.0000 mg | ORAL_TABLET | ORAL | Status: DC | PRN
  Administered 2022-12-29: 1 mg via ORAL
  Administered 2022-12-29: 2 mg via ORAL
  Filled 2022-12-28 (×3): qty 1

## 2022-12-28 MED ORDER — DEXAMETHASONE SODIUM PHOSPHATE 10 MG/ML IJ SOLN
INTRAMUSCULAR | Status: DC | PRN
  Administered 2022-12-28 (×5): 5 mg via INTRAVENOUS

## 2022-12-28 MED ORDER — SODIUM CHLORIDE 0.9 % IR SOLN
Status: DC | PRN
  Administered 2022-12-28: 3000 mL

## 2022-12-28 MED ORDER — HYDROMORPHONE HCL 1 MG/ML IJ SOLN
0.5000 mg | INTRAMUSCULAR | Status: DC | PRN
  Administered 2022-12-28 – 2022-12-29 (×2): 0.5 mg via INTRAVENOUS
  Filled 2022-12-28 (×2): qty 0.5

## 2022-12-28 MED ORDER — ALBUMIN HUMAN 5 % IV SOLN
INTRAVENOUS | Status: DC | PRN
Start: 2022-12-28 — End: 2022-12-28

## 2022-12-28 MED ORDER — ORAL CARE MOUTH RINSE: 15.0000 mL | Freq: Once | OROMUCOSAL | Status: AC

## 2022-12-28 MED ORDER — ACETAMINOPHEN 325 MG PO TABS: 325.0000 mg | ORAL_TABLET | Freq: Four times a day (QID) | ORAL | Status: DC | PRN

## 2022-12-28 MED ORDER — SODIUM CHLORIDE (PF) 0.9 % IJ SOLN: INTRAMUSCULAR | Status: DC | PRN

## 2022-12-28 MED ORDER — METOCLOPRAMIDE HCL 5 MG PO TABS: 5.0000 mg | ORAL_TABLET | Freq: Three times a day (TID) | ORAL | Status: DC | PRN

## 2022-12-28 MED ORDER — TRANEXAMIC ACID-NACL 1000-0.7 MG/100ML-% IV SOLN
INTRAVENOUS | Status: AC
  Filled 2022-12-28: qty 100

## 2022-12-28 MED ORDER — ACETAMINOPHEN 500 MG PO TABS
1000.0000 mg | ORAL_TABLET | Freq: Four times a day (QID) | ORAL | Status: DC
  Administered 2022-12-28 – 2022-12-29 (×3): 1000 mg via ORAL
  Filled 2022-12-28 (×3): qty 2

## 2022-12-28 MED ORDER — SODIUM CHLORIDE 0.9 % IV SOLN: INTRAVENOUS | Status: AC

## 2022-12-28 MED ORDER — PHENYLEPHRINE HCL-NACL 20-0.9 MG/250ML-% IV SOLN
INTRAVENOUS | Status: DC | PRN
  Administered 2022-12-28: 45 ug/min via INTRAVENOUS

## 2022-12-28 MED ORDER — IRRISEPT - 450ML BOTTLE WITH 0.05% CHG IN STERILE WATER, USP 99.95% OPTIME
TOPICAL | Status: DC | PRN
  Administered 2022-12-28: 450 mL via TOPICAL

## 2022-12-28 MED ORDER — PHENOL 1.4 % MT LIQD: 1.0000 | OROMUCOSAL | Status: DC | PRN

## 2022-12-28 MED ORDER — PROPOFOL 1000 MG/100ML IV EMUL
INTRAVENOUS | Status: AC
  Filled 2022-12-28: qty 100

## 2022-12-28 MED ORDER — ONDANSETRON HCL 4 MG/2ML IJ SOLN: 4.0000 mg | Freq: Four times a day (QID) | INTRAMUSCULAR | Status: DC | PRN

## 2022-12-28 MED ORDER — PANTOPRAZOLE SODIUM 40 MG PO TBEC
40.0000 mg | DELAYED_RELEASE_TABLET | Freq: Every day | ORAL | Status: DC
  Administered 2022-12-28 – 2022-12-29 (×2): 40 mg via ORAL
  Filled 2022-12-28 (×2): qty 1

## 2022-12-28 MED ORDER — FENTANYL CITRATE (PF) 100 MCG/2ML IJ SOLN
25.0000 ug | INTRAMUSCULAR | Status: DC | PRN
  Administered 2022-12-28: 50 ug via INTRAVENOUS

## 2022-12-28 MED ORDER — VANCOMYCIN HCL 1000 MG IV SOLR
INTRAVENOUS | Status: DC | PRN
  Administered 2022-12-28: 1000 mg via TOPICAL

## 2022-12-28 MED ORDER — METHOCARBAMOL 500 MG PO TABS
ORAL_TABLET | ORAL | Status: AC
  Filled 2022-12-28: qty 1

## 2022-12-28 MED ORDER — ASPIRIN 81 MG PO CHEW
81.0000 mg | CHEWABLE_TABLET | Freq: Two times a day (BID) | ORAL | Status: DC
  Administered 2022-12-28: 81 mg via ORAL
  Filled 2022-12-28 (×2): qty 1

## 2022-12-28 MED ORDER — DOCUSATE SODIUM 100 MG PO CAPS
100.0000 mg | ORAL_CAPSULE | Freq: Two times a day (BID) | ORAL | Status: DC
  Administered 2022-12-28 – 2022-12-29 (×2): 100 mg via ORAL
  Filled 2022-12-28 (×2): qty 1

## 2022-12-28 MED ORDER — METOCLOPRAMIDE HCL 5 MG/ML IJ SOLN: 5.0000 mg | Freq: Three times a day (TID) | INTRAMUSCULAR | Status: DC | PRN

## 2022-12-28 MED ORDER — POVIDONE-IODINE 10 % EX SWAB
2.0000 | Freq: Once | CUTANEOUS | Status: AC
  Administered 2022-12-28: 2 via TOPICAL

## 2022-12-28 MED ORDER — MENTHOL 3 MG MT LOZG: 1.0000 | LOZENGE | OROMUCOSAL | Status: DC | PRN

## 2022-12-28 MED ORDER — CEFAZOLIN SODIUM-DEXTROSE 2-4 GM/100ML-% IV SOLN
2.0000 g | Freq: Three times a day (TID) | INTRAVENOUS | Status: AC
  Administered 2022-12-28 – 2022-12-29 (×2): 2 g via INTRAVENOUS
  Filled 2022-12-28 (×2): qty 100

## 2022-12-28 MED ORDER — POVIDONE-IODINE 10 % EX SWAB: 2.0000 | Freq: Once | CUTANEOUS | Status: DC

## 2022-12-28 MED ORDER — ACETAMINOPHEN 500 MG PO TABS
1000.0000 mg | ORAL_TABLET | Freq: Once | ORAL | Status: AC
  Administered 2022-12-28: 1000 mg via ORAL
  Filled 2022-12-28: qty 2

## 2022-12-28 MED ORDER — 0.9 % SODIUM CHLORIDE (POUR BTL) OPTIME
TOPICAL | Status: DC | PRN
  Administered 2022-12-28: 1000 mL

## 2022-12-28 MED ORDER — PHENYLEPHRINE 80 MCG/ML (10ML) SYRINGE FOR IV PUSH (FOR BLOOD PRESSURE SUPPORT): PREFILLED_SYRINGE | INTRAVENOUS | Status: DC | PRN

## 2022-12-28 MED ORDER — FENTANYL CITRATE (PF) 100 MCG/2ML IJ SOLN: 100.0000 ug | Freq: Once | INTRAMUSCULAR | Status: AC

## 2022-12-28 MED ORDER — LEVOTHYROXINE SODIUM 88 MCG PO TABS
88.0000 ug | ORAL_TABLET | Freq: Every day | ORAL | Status: DC
  Administered 2022-12-29: 88 ug via ORAL
  Filled 2022-12-28: qty 1

## 2022-12-28 MED ORDER — FENTANYL CITRATE (PF) 100 MCG/2ML IJ SOLN
INTRAMUSCULAR | Status: AC
  Administered 2022-12-28: 100 ug via INTRAVENOUS
  Filled 2022-12-28: qty 2

## 2022-12-28 MED ORDER — CLONIDINE HCL (ANALGESIA) 100 MCG/ML EP SOLN
EPIDURAL | Status: AC
  Filled 2022-12-28: qty 10

## 2022-12-28 MED ORDER — ONDANSETRON HCL 4 MG PO TABS: 4.0000 mg | ORAL_TABLET | Freq: Four times a day (QID) | ORAL | Status: DC | PRN

## 2022-12-28 MED ORDER — POVIDONE-IODINE 7.5 % EX SOLN
Freq: Once | CUTANEOUS | Status: DC
  Filled 2022-12-28: qty 118

## 2022-12-28 MED ORDER — EPHEDRINE SULFATE-NACL 50-0.9 MG/10ML-% IV SOSY
PREFILLED_SYRINGE | INTRAVENOUS | Status: DC | PRN
  Administered 2022-12-28 (×2): 5 mg via INTRAVENOUS

## 2022-12-28 MED ORDER — METHOCARBAMOL 1000 MG/10ML IJ SOLN: 500.0000 mg | Freq: Four times a day (QID) | INTRAVENOUS | Status: DC | PRN

## 2022-12-28 MED ORDER — DEXAMETHASONE SODIUM PHOSPHATE 10 MG/ML IJ SOLN
INTRAMUSCULAR | Status: AC
  Filled 2022-12-28: qty 1

## 2022-12-28 MED ORDER — TRANEXAMIC ACID-NACL 1000-0.7 MG/100ML-% IV SOLN
1000.0000 mg | INTRAVENOUS | Status: AC
  Administered 2022-12-28: 1000 mg via INTRAVENOUS

## 2022-12-28 MED ORDER — CEFAZOLIN SODIUM-DEXTROSE 2-4 GM/100ML-% IV SOLN
INTRAVENOUS | Status: AC
  Filled 2022-12-28: qty 100

## 2022-12-28 MED ORDER — BUPIVACAINE LIPOSOME 1.3 % IJ SUSP
INTRAMUSCULAR | Status: AC
  Filled 2022-12-28: qty 20

## 2022-12-28 MED ORDER — ONDANSETRON HCL 4 MG/2ML IJ SOLN
INTRAMUSCULAR | Status: AC
  Filled 2022-12-28: qty 2

## 2022-12-28 MED ORDER — MORPHINE SULFATE (PF) 4 MG/ML IV SOLN
INTRAVENOUS | Status: AC
  Filled 2022-12-28: qty 2

## 2022-12-28 MED ORDER — PROPOFOL 10 MG/ML IV BOLUS
INTRAVENOUS | Status: DC | PRN
  Administered 2022-12-28: 20 mg via INTRAVENOUS

## 2022-12-28 MED ORDER — CEFAZOLIN SODIUM-DEXTROSE 2-4 GM/100ML-% IV SOLN
2.0000 g | INTRAVENOUS | Status: AC
  Administered 2022-12-28: 2 g via INTRAVENOUS

## 2022-12-28 MED ORDER — FENTANYL CITRATE (PF) 100 MCG/2ML IJ SOLN
INTRAMUSCULAR | Status: AC
  Filled 2022-12-28: qty 2

## 2022-12-28 MED ORDER — BUPIVACAINE IN DEXTROSE 0.75-8.25 % IT SOLN
INTRATHECAL | Status: DC | PRN
Start: 2022-12-28 — End: 2022-12-28
  Administered 2022-12-28: 2 mL via INTRATHECAL

## 2022-12-28 MED ORDER — CHLORHEXIDINE GLUCONATE 0.12 % MT SOLN
OROMUCOSAL | Status: AC
  Administered 2022-12-28: 15 mL via OROMUCOSAL
  Filled 2022-12-28: qty 15

## 2022-12-28 SURGICAL SUPPLY — 85 items
BAG COUNTER SPONGE SURGICOUNT (BAG) ×1 IMPLANT
BAG DECANTER FOR FLEXI CONT (MISCELLANEOUS) ×1 IMPLANT
BAG SPNG CNTER NS LX DISP (BAG) ×1
BANDAGE ESMARK 6X9 LF (GAUZE/BANDAGES/DRESSINGS) ×1 IMPLANT
BLADE SAG 18X100X1.27 (BLADE) ×1 IMPLANT
BLADE SAGITTAL (BLADE) ×1
BLADE SAW SGTL 11.0X1.19X90.0M (BLADE) IMPLANT
BLADE SAW THK.89X75X18XSGTL (BLADE) ×1 IMPLANT
BNDG CMPR 5X6 CHSV STRCH STRL (GAUZE/BANDAGES/DRESSINGS) ×1
BNDG CMPR 9X6 STRL LF SNTH (GAUZE/BANDAGES/DRESSINGS) ×1
BNDG CMPR MED 15X6 ELC VLCR LF (GAUZE/BANDAGES/DRESSINGS) ×1
BNDG COHESIVE 6X5 TAN ST LF (GAUZE/BANDAGES/DRESSINGS) ×1 IMPLANT
BNDG ELASTIC 6X15 VLCR STRL LF (GAUZE/BANDAGES/DRESSINGS) ×1 IMPLANT
BNDG ESMARK 6X9 LF (GAUZE/BANDAGES/DRESSINGS) ×1
BOWL SMART MIX CTS (DISPOSABLE) IMPLANT
CEMENT BONE SIMPLEX SPEEDSET (Cement) IMPLANT
CNTNR URN SCR LID CUP LEK RST (MISCELLANEOUS) ×1 IMPLANT
COMP FEM KNEE STD PS 5 RT (Joint) ×1 IMPLANT
COMP PATELLA PEG 3 32 (Joint) ×1 IMPLANT
COMP TIB KNEE PS C 0D RT (Knees) ×1 IMPLANT
COMPONENT FEM KNEE STD PS 5 RT (Joint) IMPLANT
COMPONENT PATELLA PEG 3 32 (Joint) IMPLANT
COMPONENT TIB KNEE PS C 0D RT (Knees) IMPLANT
CONT SPEC 4OZ STRL OR WHT (MISCELLANEOUS) ×1
COVER SURGICAL LIGHT HANDLE (MISCELLANEOUS) ×1 IMPLANT
CUFF TOURN SGL QUICK 34 (TOURNIQUET CUFF) ×1
CUFF TOURN SGL QUICK 42 (TOURNIQUET CUFF) IMPLANT
CUFF TRNQT CYL 34X4.125X (TOURNIQUET CUFF) ×1 IMPLANT
DRAPE INCISE IOBAN 66X45 STRL (DRAPES) IMPLANT
DRAPE ORTHO SPLIT 77X108 STRL (DRAPES) ×3
DRAPE SURG ORHT 6 SPLT 77X108 (DRAPES) ×3 IMPLANT
DRAPE U-SHAPE 47X51 STRL (DRAPES) ×1 IMPLANT
DRSG AQUACEL AG ADV 3.5X14 (GAUZE/BANDAGES/DRESSINGS) IMPLANT
DURAPREP 26ML APPLICATOR (WOUND CARE) ×2 IMPLANT
ELECT CAUTERY BLADE 6.4 (BLADE) ×1 IMPLANT
ELECT REM PT RETURN 9FT ADLT (ELECTROSURGICAL) ×1
ELECTRODE REM PT RTRN 9FT ADLT (ELECTROSURGICAL) ×1 IMPLANT
GAUZE SPONGE 4X4 12PLY STRL (GAUZE/BANDAGES/DRESSINGS) ×1 IMPLANT
GLOVE BIOGEL PI IND STRL 7.0 (GLOVE) ×1 IMPLANT
GLOVE BIOGEL PI IND STRL 8 (GLOVE) ×1 IMPLANT
GLOVE ECLIPSE 7.0 STRL STRAW (GLOVE) ×1 IMPLANT
GLOVE ECLIPSE 8.0 STRL XLNG CF (GLOVE) ×1 IMPLANT
GLOVE SURG ENC MOIS LTX SZ6.5 (GLOVE) ×3 IMPLANT
GOWN STRL REUS W/ TWL LRG LVL3 (GOWN DISPOSABLE) ×3 IMPLANT
GOWN STRL REUS W/TWL LRG LVL3 (GOWN DISPOSABLE) ×3
HANDPIECE INTERPULSE COAX TIP (DISPOSABLE) ×1
HOOD PEEL AWAY T7 (MISCELLANEOUS) ×3 IMPLANT
IMMOBILIZER KNEE 20 (SOFTGOODS)
IMMOBILIZER KNEE 20 THIGH 36 (SOFTGOODS) IMPLANT
IMMOBILIZER KNEE 22 UNIV (SOFTGOODS) IMPLANT
IMMOBILIZER KNEE 24 THIGH 36 (MISCELLANEOUS) IMPLANT
IMMOBILIZER KNEE 24 UNIV (MISCELLANEOUS)
INSERT TIB ASF 11 4-5/CD RT (Insert) IMPLANT
KIT BASIN OR (CUSTOM PROCEDURE TRAY) ×1 IMPLANT
KIT TURNOVER KIT B (KITS) ×1 IMPLANT
MANIFOLD NEPTUNE II (INSTRUMENTS) ×1 IMPLANT
NDL 22X1.5 STRL (OR ONLY) (MISCELLANEOUS) ×2 IMPLANT
NDL SPNL 18GX3.5 QUINCKE PK (NEEDLE) ×1 IMPLANT
NEEDLE 22X1.5 STRL (OR ONLY) (MISCELLANEOUS) ×2 IMPLANT
NEEDLE SPNL 18GX3.5 QUINCKE PK (NEEDLE) ×1 IMPLANT
NS IRRIG 1000ML POUR BTL (IV SOLUTION) ×2 IMPLANT
PACK TOTAL JOINT (CUSTOM PROCEDURE TRAY) ×1 IMPLANT
PAD ARMBOARD 7.5X6 YLW CONV (MISCELLANEOUS) ×2 IMPLANT
PAD CAST 4YDX4 CTTN HI CHSV (CAST SUPPLIES) ×1 IMPLANT
PADDING CAST COTTON 4X4 STRL (CAST SUPPLIES) ×1
PADDING CAST COTTON 6X4 STRL (CAST SUPPLIES) ×1 IMPLANT
PIN DRILL HDLS TROCAR 75 4PK (PIN) IMPLANT
SCREW FEMALE HEX FIX 25X2.5 (ORTHOPEDIC DISPOSABLE SUPPLIES) IMPLANT
SET HNDPC FAN SPRY TIP SCT (DISPOSABLE) ×1 IMPLANT
SPIKE FLUID TRANSFER (MISCELLANEOUS) ×1 IMPLANT
STRIP CLOSURE SKIN 1/2X4 (GAUZE/BANDAGES/DRESSINGS) ×2 IMPLANT
SUCTION TUBE FRAZIER 10FR DISP (SUCTIONS) ×1 IMPLANT
SUT MNCRL AB 3-0 PS2 18 (SUTURE) ×1 IMPLANT
SUT VIC AB 0 CT1 27 (SUTURE) ×3
SUT VIC AB 0 CT1 27XBRD ANBCTR (SUTURE) ×3 IMPLANT
SUT VIC AB 1 CT1 36 (SUTURE) ×5 IMPLANT
SUT VIC AB 2-0 CT1 27 (SUTURE) ×4
SUT VIC AB 2-0 CT1 TAPERPNT 27 (SUTURE) ×4 IMPLANT
SYR 30ML LL (SYRINGE) ×3 IMPLANT
SYR TB 1ML LUER SLIP (SYRINGE) ×1 IMPLANT
TOWEL GREEN STERILE (TOWEL DISPOSABLE) ×2 IMPLANT
TOWEL GREEN STERILE FF (TOWEL DISPOSABLE) ×2 IMPLANT
TRAY CATH INTERMITTENT SS 16FR (CATHETERS) IMPLANT
WATER STERILE IRR 1000ML POUR (IV SOLUTION) IMPLANT
YANKAUER SUCT BULB TIP NO VENT (SUCTIONS) ×1 IMPLANT

## 2022-12-28 NOTE — Anesthesia Procedure Notes (Addendum)
Spinal  Patient location during procedure: OR Start time: 12/28/2022 11:47 AM End time: 12/28/2022 11:52 AM Reason for block: surgical anesthesia Staffing Performed: anesthesiologist  Anesthesiologist: Gaynelle Adu, MD Performed by: Gaynelle Adu, MD Authorized by: Gaynelle Adu, MD   Preanesthetic Checklist Completed: patient identified, IV checked, risks and benefits discussed, surgical consent, monitors and equipment checked, pre-op evaluation and timeout performed Spinal Block Patient position: sitting Prep: DuraPrep Patient monitoring: cardiac monitor, continuous pulse ox and blood pressure Approach: midline Location: L3-4 Injection technique: single-shot Needle Needle type: Pencan  Needle gauge: 24 G Needle length: 9 cm Assessment Sensory level: T8 Events: CSF return Additional Notes Functioning IV was confirmed and monitors were applied. Sterile prep and drape, including hand hygiene and sterile gloves were used. The patient was positioned and the spine was prepped. The skin was anesthetized with lidocaine.  Free flow of clear CSF was obtained prior to injecting local anesthetic into the CSF.  The spinal needle aspirated freely following injection.  The needle was carefully withdrawn.  The patient tolerated the procedure well.

## 2022-12-28 NOTE — Anesthesia Postprocedure Evaluation (Signed)
Anesthesia Post Note  Patient: Victoria Reyes  Procedure(s) Performed: TOTAL KNEE ARTHROPLASTY (Right: Knee)     Patient location during evaluation: PACU Anesthesia Type: Spinal and Regional Level of consciousness: oriented and awake and alert Pain management: pain level controlled Vital Signs Assessment: post-procedure vital signs reviewed and stable Respiratory status: spontaneous breathing, respiratory function stable and patient connected to nasal cannula oxygen Cardiovascular status: blood pressure returned to baseline and stable Postop Assessment: no headache, no backache, no apparent nausea or vomiting, spinal receding and patient able to bend at knees Anesthetic complications: no  No notable events documented.  Last Vitals:  Vitals:   12/28/22 1545 12/28/22 1608  BP: (!) 108/55 119/63  Pulse: 63 61  Resp: 13 16  Temp: (!) 36.3 C 36.4 C  SpO2: 96% 98%    Last Pain:  Vitals:   12/28/22 1642  TempSrc:   PainSc: 9                  Jeanny Rymer,W. EDMOND

## 2022-12-28 NOTE — Anesthesia Preprocedure Evaluation (Addendum)
Anesthesia Evaluation  Patient identified by MRN, date of birth, ID band Patient awake    Reviewed: Allergy & Precautions, H&P , NPO status , Patient's Chart, lab work & pertinent test results  Airway Mallampati: II  TM Distance: >3 FB Neck ROM: Full    Dental no notable dental hx. (+) Teeth Intact, Dental Advisory Given   Pulmonary former smoker   Pulmonary exam normal breath sounds clear to auscultation       Cardiovascular negative cardio ROS  Rhythm:Regular Rate:Normal     Neuro/Psych  Headaches  negative psych ROS   GI/Hepatic Neg liver ROS, PUD,GERD  Medicated,,  Endo/Other  Hypothyroidism  Morbid obesity  Renal/GU negative Renal ROS  negative genitourinary   Musculoskeletal  (+) Arthritis , Osteoarthritis,    Abdominal   Peds  Hematology  (+) Blood dyscrasia, anemia   Anesthesia Other Findings   Reproductive/Obstetrics negative OB ROS                             Anesthesia Physical Anesthesia Plan  ASA: 3  Anesthesia Plan: Spinal   Post-op Pain Management: Regional block* and Tylenol PO (pre-op)*   Induction: Intravenous  PONV Risk Score and Plan: 3 and Ondansetron, Dexamethasone and Propofol infusion  Airway Management Planned: Natural Airway and Simple Face Mask  Additional Equipment:   Intra-op Plan:   Post-operative Plan:   Informed Consent: I have reviewed the patients History and Physical, chart, labs and discussed the procedure including the risks, benefits and alternatives for the proposed anesthesia with the patient or authorized representative who has indicated his/her understanding and acceptance.     Dental advisory given  Plan Discussed with: CRNA  Anesthesia Plan Comments:        Anesthesia Quick Evaluation

## 2022-12-28 NOTE — Anesthesia Procedure Notes (Signed)
Date/Time: 12/28/2022 11:46 AM  Performed by: Shary Decamp, CRNAPre-anesthesia Checklist: Patient identified, Emergency Drugs available, Suction available, Timeout performed and Patient being monitored Patient Re-evaluated:Patient Re-evaluated prior to induction Oxygen Delivery Method: Simple face mask

## 2022-12-28 NOTE — Transfer of Care (Signed)
Immediate Anesthesia Transfer of Care Note  Patient: Victoria Reyes  Procedure(s) Performed: TOTAL KNEE ARTHROPLASTY (Right: Knee)  Patient Location: PACU  Anesthesia Type:Spinal  Level of Consciousness: awake, alert , oriented, and patient cooperative  Airway & Oxygen Therapy: Patient Spontanous Breathing  Post-op Assessment: Report given to RN and Post -op Vital signs reviewed and stable  Post vital signs: Reviewed and stable  Last Vitals:  Vitals Value Taken Time  BP    Temp    Pulse 87 12/28/22 1440  Resp    SpO2 94 % 12/28/22 1440  Vitals shown include unfiled device data.  Last Pain:  Vitals:   12/28/22 1125  TempSrc:   PainSc: 0-No pain         Complications: No notable events documented.

## 2022-12-28 NOTE — Brief Op Note (Signed)
   12/28/2022  2:55 PM  PATIENT:  Victoria Reyes  72 y.o. female  PRE-OPERATIVE DIAGNOSIS:  right knee osteoarthritis  POST-OPERATIVE DIAGNOSIS:  right knee osteoarthritis  PROCEDURE:  Procedure(s): TOTAL KNEE ARTHROPLASTY  SURGEON:  Surgeon(s): Cammy Copa, MD  ASSISTANT: magnant pa  ANESTHESIA:   spinal  EBL: 75 ml    Total I/O In: 1250 [I.V.:1000; IV Piggyback:250] Out: 425 [Urine:350; Blood:75]  BLOOD ADMINISTERED: none  DRAINS: none   LOCAL MEDICATIONS USED:  marcaine morphine clonidine vanco SPECIMEN:  No Specimen  COUNTS:  YES  TOURNIQUET:   Total Tourniquet Time Documented: Thigh (Left) - 84 minutes Total: Thigh (Left) - 84 minutes   DICTATION: .Other Dictation: Dictation Number [id number]  PLAN OF CARE: Admit for overnight observation  PATIENT DISPOSITION:  PACU - hemodynamically stable

## 2022-12-28 NOTE — Anesthesia Procedure Notes (Signed)
Anesthesia Regional Block: Adductor canal block   Pre-Anesthetic Checklist: , timeout performed,  Correct Patient, Correct Site, Correct Laterality,  Correct Procedure, Correct Position, site marked,  Risks and benefits discussed,  Pre-op evaluation,  At surgeon's request and post-op pain management  Laterality: Right  Prep: Maximum Sterile Barrier Precautions used, chloraprep       Needles:  Injection technique: Single-shot  Needle Type: Echogenic Stimulator Needle     Needle Length: 9cm  Needle Gauge: 21     Additional Needles:   Procedures:,,,, ultrasound used (permanent image in chart),,    Narrative:  Start time: 12/28/2022 11:14 AM End time: 12/28/2022 11:24 AM Injection made incrementally with aspirations every 5 mL.  Performed by: Personally  Anesthesiologist: Gaynelle Adu, MD

## 2022-12-28 NOTE — H&P (Signed)
TOTAL KNEE ADMISSION H&P  Patient is being admitted for right total knee arthroplasty.  Subjective:  Chief Complaint:right knee pain.  HPI: Victoria Reyes, 72 y.o. female, has a history of pain and functional disability in the right knee due to arthritis and has failed non-surgical conservative treatments for greater than 12 weeks to includeNSAID's and/or analgesics, corticosteriod injections, flexibility and strengthening excercises, and activity modification.  Onset of symptoms was gradual, starting >10 years ago with gradually worsening course since that time. The patient noted no past surgery on the right knee(s).  Patient currently rates pain in the right knee(s) at 9 out of 10 with activity. Patient has night pain, worsening of pain with activity and weight bearing, pain that interferes with activities of daily living, pain with passive range of motion, crepitus, and joint swelling.  Patient has evidence of subchondral sclerosis and joint space narrowing by imaging studies. This patient has  good family support at home and no history of pe or dvt . There is no active infection.  Patient Active Problem List   Diagnosis Date Noted   Low vitamin D level 11/01/2022   Leg swelling 06/28/2022   Giardiasis 08/23/2021   Tapeworm infection 08/23/2021   Leg pain, left 11/17/2020   Alopecia 05/06/2020   History of Helicobacter pylori infection 04/09/2020   Anemia 04/09/2020   History of adenomatous polyp of colon 04/09/2020   Gastric ulcer 01/29/2020   Duodenal ulcer 01/29/2020   Knee pain, right 11/08/2019   Sacro ilial pain 11/08/2019   Chronic back pain 09/11/2019   Immunization refused 03/11/2019   Encounter for annual physical exam 07/10/2015   Cutaneous wart 09/30/2014   Hyperlipidemia with target LDL less than 100 05/04/2012   GERD (gastroesophageal reflux disease) 03/14/2011   Chronic right-sided low back pain with right-sided sciatica 06/25/2009   Morbid obesity (HCC) 02/17/2007    Hypothyroidism 06/14/2006   DIVERTICULOSIS, COLON 03/01/2006   HIATAL HERNIA WITH REFLUX, HX OF 03/01/2006   Past Medical History:  Diagnosis Date   Anemia    as a  young woman   Diverticulitis 2005   CT    DJD (degenerative joint disease)    GERD (gastroesophageal reflux disease)    H. pylori infection 01/2020   S/p treatment with omeprazole, amoxicillin, and Biaxin.   Headache(784.0)    Hyperlipidemia    Hypothyroidism    Knee pain, right    Low back pain    Neuropathy    peroneal nerve   Neuropathy    left lower leg from back surgery per patient   Obesity    Pneumonia    as child   S/P colonoscopy 2005   Dr. Katrinka Blazing: sigmoid diverticulosis   Thyroid disease    Tobacco abuse     Past Surgical History:  Procedure Laterality Date   BACK SURGERY     BIOPSY  01/29/2020   Procedure: BIOPSY;  Surgeon: Dolores Frame, MD;  Location: [facility];  Service: Gastroenterology;;   BIOPSY  05/13/2020   Procedure: BIOPSY;  Surgeon: Lanelle Bal, DO;  Location: [facility] ENDOSCOPY;  Service: Endoscopy;;   COLONOSCOPY  12/18/2010   Procedure: COLONOSCOPY;  Surgeon: Arlyce Harman, MD; 3 mm sessile polyp at hepatic flexure, pancolonic diverticulosis, internal hemorrhoids, anal lesion just above the dentate line biopsied via cold forceps.  Colonic polyp was a tubular adenoma.  Anus biopsy with mild active inflammation associated with reactive epithelial changes and hemorrhage.  Recommended repeat colonoscopy in 2022.  COLONOSCOPY WITH PROPOFOL N/A 05/13/2020   Procedure: COLONOSCOPY WITH PROPOFOL;  Surgeon: Lanelle Bal, DO;  Location: [facility] ENDOSCOPY;  Service: Endoscopy;  Laterality: N/A;  12:30PM   cyst removal right wrist     ESOPHAGOGASTRODUODENOSCOPY (EGD) WITH PROPOFOL N/A 01/29/2020   Procedure: ESOPHAGOGASTRODUODENOSCOPY (EGD) WITH PROPOFOL;  Surgeon: Marguerita Merles, Reuel Boom, MD;   3 gastric ulcers with clean base, 5 ulcers in the duodenal bulb and first portion of  duodenum, 2 were noted to be oozing, treated with bipolar cautery.  Gastric biopsies consistent with H. pylori gastritis.   ESOPHAGOGASTRODUODENOSCOPY (EGD) WITH PROPOFOL N/A 05/13/2020   Procedure: ESOPHAGOGASTRODUODENOSCOPY (EGD) WITH PROPOFOL;  Surgeon: Lanelle Bal, DO;  Location: [facility] ENDOSCOPY;  Service: Endoscopy;  Laterality: N/A;   EYE SURGERY Bilateral    cataract surgery with lens implant   POLYPECTOMY  05/13/2020   Procedure: POLYPECTOMY;  Surgeon: Lanelle Bal, DO;  Location: [facility] ENDOSCOPY;  Service: Endoscopy;;   TONSILLECTOMY     TOTAL SHOULDER ARTHROPLASTY Left 12/21/2016   Procedure: TOTAL SHOULDER ARTHROPLASTY;  Surgeon: Cammy Copa, MD;  Location: [facility] OR;  Service: Orthopedics;  Laterality: Left;   TUBAL LIGATION     umbillical hernia      Current Facility-Administered Medications  Medication Dose Route Frequency Provider Last Rate Last Admin   ceFAZolin (ANCEF) 2-4 GM/100ML-% IVPB            ceFAZolin (ANCEF) IVPB 2g/100 mL premix  2 g Intravenous On Call to OR Magnant, Joycie Peek, PA-C       fentaNYL (SUBLIMAZE) 100 MCG/2ML injection            lactated ringers infusion   Intravenous Continuous Gaynelle Adu, MD 10 mL/hr at 12/28/22 1037 New Bag at 12/28/22 1037   midazolam (VERSED) 2 MG/2ML injection            povidone-iodine (BETADINE) 7.5 % scrub   Topical Once Magnant, Charles L, PA-C       povidone-iodine 10 % swab 2 Application  2 Application Topical Once Magnant, Charles L, PA-C       tranexamic acid (CYKLOKAPRON) 1000MG /163mL IVPB            tranexamic acid (CYKLOKAPRON) 2,000 mg in sodium chloride 0.9 % 50 mL Topical Application  2,000 mg Topical To OR Magnant, Charles L, PA-C       tranexamic acid (CYKLOKAPRON) IVPB 1,000 mg  1,000 mg Intravenous To OR Magnant, Charles L, PA-C       Allergies  Allergen Reactions   Nsaids Other (See Comments)    Bleeding gastric and duodenal ulcers   Hydrocodone Nausea Only    Can not take anything with "  Done" on the end   Sulfa Antibiotics Itching    Social History   Tobacco Use   Smoking status: Former    Current packs/day: 0.00    Average packs/day: 0.3 packs/day for 51.9 years (13.0 ttl pk-yrs)    Types: Cigarettes    Start date: 09/30/1963    Quit date: 08/15/2015    Years since quitting: 7.3   Smokeless tobacco: Never  Substance Use Topics   Alcohol use: No    Alcohol/week: 0.0 standard drinks of alcohol    Family History  Problem Relation Age of Onset   Heart attack Sister        pacemaker   Kidney failure Sister        on dialysis   Colon cancer Neg Hx    Stomach cancer Neg  Hx    Esophageal cancer Neg Hx      Review of Systems  Musculoskeletal:  Positive for arthralgias.  All other systems reviewed and are negative.   Objective:  Physical Exam Vitals reviewed.  HENT:     Head: Normocephalic.     Nose: Nose normal.     Mouth/Throat:     Mouth: Mucous membranes are moist.  Eyes:     Pupils: Pupils are equal, round, and reactive to light.  Cardiovascular:     Rate and Rhythm: Normal rate.     Pulses: Normal pulses.  Pulmonary:     Effort: Pulmonary effort is normal.  Abdominal:     General: Abdomen is flat.  Musculoskeletal:     Cervical back: Normal range of motion.  Skin:    General: Skin is warm.     Capillary Refill: Capillary refill takes less than 2 seconds.  Neurological:     General: No focal deficit present.     Mental Status: She is alert.  Psychiatric:        Mood and Affect: Mood normal.   Ortho exam demonstrates range of motion on the right of 10 degrees to 100. Collateral cruciate ligaments are stable. Extensor mechanism intact. Pedal pulses palpable 2+ out of 4. No masses lymphadenopathy or skin changes noted in that right knee region. No nerve root tension signs. No groin pain with internal or external rotation of that right leg.  Vital signs in last 24 hours: Temp:  [98.4 F (36.9 C)] 98.4 F (36.9 C) (09/17 0954) Pulse Rate:   [118] 118 (09/17 0954) Resp:  [18] 18 (09/17 0954) BP: (148)/(73) 148/73 (09/17 0954) SpO2:  [98 %] 98 % (09/17 0954) Weight:  [90.7 kg] 90.7 kg (09/17 0954)  Labs:   Estimated body mass index is 37.18 kg/m as calculated from the following:   Height as of this encounter: 5' 1.5" (1.562 m).   Weight as of this encounter: 90.7 kg.   Imaging Review Plain radiographs demonstrate severe degenerative joint disease of the right knee(s). The overall alignment ismild varus. The bone quality appears to be good for age and reported activity level.      Assessment/Plan:  End stage arthritis, right knee   The patient history, physical examination, clinical judgment of the provider and imaging studies are consistent with end stage degenerative joint disease of the right knee(s) and total knee arthroplasty is deemed medically necessary. The treatment options including medical management, injection therapy arthroscopy and arthroplasty were discussed at length. The risks and benefits of total knee arthroplasty were presented and reviewed. The risks due to aseptic loosening, infection, stiffness, patella tracking problems, thromboembolic complications and other imponderables were discussed. The patient acknowledged the explanation, agreed to proceed with the plan and consent was signed. Patient is being admitted for inpatient treatment for surgery, pain control, PT, OT, prophylactic antibiotics, VTE prophylaxis, progressive ambulation and ADL's and discharge planning. The patient is planning to be discharged home with home health services     Patient's anticipated LOS is less than 2 midnights, meeting these requirements: - Younger than 85 - Lives within 1 hour of care - Has a competent adult at home to recover with post-op recover - NO history of  - Chronic pain requiring opiods  - Diabetes  - Coronary Artery Disease  - Heart failure  - Heart attack  - Stroke  - DVT/VTE  - Cardiac  arrhythmia  - Respiratory Failure/COPD  - Renal failure  -  Anemia  - Advanced Liver disease

## 2022-12-28 NOTE — Op Note (Unsigned)
NAME: Victoria Reyes, Victoria Reyes MEDICAL RECORD NO: [id number] ACCOUNT NO: [id number] DATE OF BIRTH: 05-09-1950 FACILITY: [facility] LOCATION: [facility] PHYSICIAN: Graylin Shiver. August Saucer, MD  Operative Report   DATE OF PROCEDURE: 12/28/2022  PREOPERATIVE DIAGNOSIS:  Right knee arthritis.  POSTOPERATIVE DIAGNOSIS:  Right knee arthritis.  PROCEDURE:  Right total knee replacement using press-fit components cruciate retaining size 5 femur, size C Persona tibia, 3-peg press-fit patella 32 mm and 11 mm medial congruent highly cross-linked polyethylene spacer.  SURGEON:  Graylin Shiver. August Saucer, MD  ASSISTANT:  Karenann Cai, PA.  INDICATIONS:  This is a 72 year old patient with end-stage right knee arthritis.  Presents for operative management after explanation of risks and benefits.  Failure of conservative management.  DESCRIPTION OF PROCEDURE:  The patient was brought to the operating room where spinal anesthetic was induced.  Preoperative antibiotics administered.  Timeout was called.  Right leg was prescrubbed with alcohol and Betadine, allowed to air dry, prepped  with DuraPrep solution and draped in sterile manner.  Ioban used to cover the operative field.  Timeout was called.  Preoperative range of motion was about 10-100.  The leg was elevated and exsanguinated with the Esmarch wrap.  Tourniquet was inflated.   Total tourniquet time about 80 minutes.  Right leg was wrapped out with the Esmarch.  Tourniquet inflated.  Anterior approach to the knee was made.  Skin and subcutaneous tissue sharply divided.  IrriSept solution utilized.  Median parapatellar  arthrotomy was made and marked with #1 Vicryl suture, IrriSept solution again utilized in the joint.  Medial soft tissue dissection was performed proportional to the patient's mild to moderate varus deformity preoperatively.  The osteophytes were  removed.  Lateral patellofemoral ligament released.  Soft tissue removed from the anterior distal femur.  Fat pad partially  excised.  ACL released and the anterior horn lateral meniscus released.  Severe end-stage tricompartmental arthritis was present.   Intramedullary alignment was then used to make a cut of initially 10 mm off the least affected lateral tibial plateau, which was measured to be 8 mm.  Two more millimeters were then taken.  This was done perpendicular to the mechanical axis with the  collaterals and posterior neurovascular structures protected.  Next, the intramedullary alignment at 5-degrees valgus was then used to cut the distal femur.  Initially 10 mm and then 2 more millimeters because of the patient's preoperative flexion  contracture.  This allowed a 10 mm spacer to sit nicely with good stability after the tibial and femoral cuts were made.  The femur was then cut in 3 degrees of external rotation, which gave symmetric flexion and extension gaps.  Anterior, posterior and  chamfer cuts were made.  Bone quality was excellent. The tibial tray was then placed aligned rotationally with the medial third of the tibial tubercle.  With the tray in position trial reduction was performed with the size 5 femur and the size C tibial  tray with both 10 and 11 mm polyethylene spacers.  Patella was then cut down from 21 mm to 12 mm.  A 3-PEG patellar trial was placed.  With trial components in position the patient achieved full extension, very good flexion past 90 and very good  stability to varus and valgus stress at 0, 30 and 90 degrees.  Patella tracked well using no thumbs technique.  Final preparations made on the femur and the tibia.  At this time, 3 liters of irrigating solution were utilized in the joint.  Next, the  capsule  was anesthetized using a combination of Marcaine, saline and Exparel.  Next TXA sponge was allowed to sit in the knee for 3 minutes along with IrriSept solution.  After this, the sponges were removed.  IrriSept solution used to irrigate the joint  surfaces again.  Vancomycin placed into the  tibial canal and the component was tapped into position with excellent press fit obtained.  Femur tapped into position, also with excellent press fit obtained.  A 11 mm spacer was placed and the 32 mm 3-peg  patella was placed.  Same stability parameters were maintained.  Tourniquet was released.  Bleeding points encountered controlled using electrocautery.  Pouring irrigation x4 liters then utilized.  The median parapatellar arthrotomy was closed over  bolster using #1 Vicryl suture.  Next, after near closure of the arthrotomy, the joint was again irrigated with IrriSept solution, which was then removed and vancomycin powder, the knee joint.  The arthrotomy was then completely closed using #1 Vicryl  suture.  Next, IrriSept solution used on the arthrotomy followed by vancomycin powder.  The joint was then injected with Marcaine, morphine, clonidine solution.  Next, the skin was closed using 0 Vicryl suture, 2-0 Vicryl suture, and 3-0 Monocryl with  Steri-Strips and Aquacel dressing applied.  Ace wrap and knee immobilizer applied.  The patient tolerated the procedure well without immediate complications, transferred to the recovery room in stable condition.  Luke's assistance was required at all  times for retraction, opening, closing, mobilization of tissue.  His assistance was a medical necessity.   PUS D: 12/28/2022 3:02:14 pm T: 12/28/2022 4:09:00 pm  JOB: [id number]/ [id number]

## 2022-12-28 NOTE — Evaluation (Signed)
Physical Therapy Evaluation Patient Details Name: Victoria Reyes MRN: [id number] DOB: Feb 09, 1951 Today's Date: 12/28/2022  History of Present Illness  72 y.o. female presents to [facility] hospital on 12/28/2022 for elective R TKA. PMH includes diverticulitis, GERD, HLD, hypothyroidism.  Clinical Impression  Pt presents to PT with deficits in functional mobility, gait, balance, strength, power, ROM, endurance. Pt is able to transfer and ambulate for short household distances with support of RW and intermittent physical assistance from PT. Pt with difficulty transferring from lower surfaces at this time, difficulty involving RLE in transfer due to knee immobilizer preventing flexion. Pt will benefit from continued acute PT services in an effort to improve balance and activity tolerance. PT anticipates pt will progress well.        If plan is discharge home, recommend the following: A little help with walking and/or transfers;A little help with bathing/dressing/bathroom;Assistance with cooking/housework;Assist for transportation;Help with stairs or ramp for entrance   Can travel by private vehicle        Equipment Recommendations Rolling walker (2 wheels);BSC/3in1  Recommendations for Other Services       Functional Status Assessment Patient has had a recent decline in their functional status and demonstrates the ability to make significant improvements in function in a reasonable and predictable amount of time.     Precautions / Restrictions Precautions Precautions: Fall Required Braces or Orthoses: Knee Immobilizer - Right Knee Immobilizer - Right: On when out of bed or walking (progressive mobility with knee immobilizer and walker per activity orders) Restrictions Weight Bearing Restrictions: Yes RLE Weight Bearing: Weight bearing as tolerated      Mobility  Bed Mobility Overal bed mobility: Needs Assistance Bed Mobility: Supine to Sit, Sit to Supine     Supine to sit: Supervision,  HOB elevated Sit to supine: Supervision, HOB elevated        Transfers Overall transfer level: Needs assistance Equipment used: Rolling walker (2 wheels) Transfers: Sit to/from Stand Sit to Stand: Min assist           General transfer comment: assist to power up 2/2 lack of R knee flexion in knee immobilizer    Ambulation/Gait Ambulation/Gait assistance: Min assist Gait Distance (Feet): 80 Feet (additional trial of 15' x 2 to and from bathroom) Assistive device: Rolling walker (2 wheels) Gait Pattern/deviations: Step-through pattern Gait velocity: reduced Gait velocity interpretation: <1.8 ft/sec, indicate of risk for recurrent falls   General Gait Details: slowed step-through, one lateral loss of balance when entering bathroom but otherwise steady  Careers information officer     Tilt Bed    Modified Rankin (Stroke Patients Only)       Balance Overall balance assessment: Needs assistance Sitting-balance support: No upper extremity supported, Feet supported Sitting balance-Leahy Scale: Good     Standing balance support: Single extremity supported, Reliant on assistive device for balance Standing balance-Leahy Scale: Poor                               Pertinent Vitals/Pain Pain Assessment Pain Assessment: Faces Faces Pain Scale: Hurts even more Pain Location: R knee Pain Descriptors / Indicators: Sore Pain Intervention(s): Premedicated before session    Home Living Family/patient expects to be discharged to:: Private residence Living Arrangements: Children Available Help at Discharge: Family;Available PRN/intermittently Type of Home: House Home Access: Stairs to enter Entrance Stairs-Rails: Can reach both Entrance Stairs-Number  of Steps: 4   Home Layout: One level Home Equipment: Cane - single point      Prior Function Prior Level of Function : Independent/Modified Independent;Driving             Mobility  Comments: ambulatory without DME       Extremity/Trunk Assessment   Upper Extremity Assessment Upper Extremity Assessment: Overall WFL for tasks assessed    Lower Extremity Assessment Lower Extremity Assessment: LLE deficits/detail;RLE deficits/detail RLE Deficits / Details: generalized post-op weakness, able to perform SLR without lag LLE Deficits / Details: pt with prior nerve damage affecting sensation in LLE LLE Sensation: decreased light touch    Cervical / Trunk Assessment Cervical / Trunk Assessment: Normal  Communication   Communication Communication: No apparent difficulties Cueing Techniques: Verbal cues  Cognition Arousal: Alert Behavior During Therapy: WFL for tasks assessed/performed Overall Cognitive Status: Within Functional Limits for tasks assessed                                          General Comments General comments (skin integrity, edema, etc.): VSS on RA, pt with instance of nausea and vomiting in bathroom, RN made aware    Exercises Other Exercises Other Exercises: TKR exercise packet provided, education to be provided tomorrow as pt sleeping at end of session, not alert enough to provide quality education   Assessment/Plan    PT Assessment Patient needs continued PT services  PT Problem List Decreased strength;Decreased range of motion;Decreased activity tolerance;Decreased balance;Decreased mobility;Decreased knowledge of use of DME;Decreased safety awareness;Decreased knowledge of precautions;Pain       PT Treatment Interventions Stair training;DME instruction;Gait training;Functional mobility training;Therapeutic exercise;Therapeutic activities;Balance training;Neuromuscular re-education;Patient/family education    PT Goals (Current goals can be found in the Care Plan section)  Acute Rehab PT Goals Patient Stated Goal: to return to independence PT Goal Formulation: With patient Time For Goal Achievement:  01/01/23 Potential to Achieve Goals: Good    Frequency Min 1X/week     Co-evaluation               AM-PAC PT "6 Clicks" Mobility  Outcome Measure Help needed turning from your back to your side while in a flat bed without using bedrails?: A Little Help needed moving from lying on your back to sitting on the side of a flat bed without using bedrails?: A Little Help needed moving to and from a bed to a chair (including a wheelchair)?: A Little Help needed standing up from a chair using your arms (e.g., wheelchair or bedside chair)?: A Little Help needed to walk in hospital room?: A Little Help needed climbing 3-5 steps with a railing? : Total 6 Click Score: 16    End of Session Equipment Utilized During Treatment: Gait belt Activity Tolerance: Patient tolerated treatment well Patient left: in bed;with call bell/phone within reach;with family/visitor present Nurse Communication: Mobility status PT Visit Diagnosis: Other abnormalities of gait and mobility (R26.89);Muscle weakness (generalized) (M62.81);Pain Pain - Right/Left: Right Pain - part of body: Knee    Time: 1610-9604 PT Time Calculation (min) (ACUTE ONLY): 39 min   Charges:   PT Evaluation $PT Eval Low Complexity: 1 Low   PT General Charges $$ ACUTE PT VISIT: 1 Visit         Arlyss Gandy, PT, DPT Acute Rehabilitation Office [phone]   Arlyss Gandy 12/28/2022, 5:36 PM

## 2022-12-28 NOTE — Plan of Care (Signed)

## 2022-12-29 ENCOUNTER — Encounter (HOSPITAL_COMMUNITY): Payer: Self-pay | Admitting: Orthopedic Surgery

## 2022-12-29 DIAGNOSIS — E039 Hypothyroidism, unspecified: Secondary | ICD-10-CM | POA: Diagnosis not present

## 2022-12-29 DIAGNOSIS — E559 Vitamin D deficiency, unspecified: Secondary | ICD-10-CM | POA: Diagnosis not present

## 2022-12-29 DIAGNOSIS — M1711 Unilateral primary osteoarthritis, right knee: Secondary | ICD-10-CM | POA: Diagnosis not present

## 2022-12-29 DIAGNOSIS — Z96642 Presence of left artificial hip joint: Secondary | ICD-10-CM | POA: Diagnosis not present

## 2022-12-29 DIAGNOSIS — Z87891 Personal history of nicotine dependence: Secondary | ICD-10-CM | POA: Diagnosis not present

## 2022-12-29 MED ORDER — HYDROMORPHONE HCL 2 MG PO TABS: 1.0000 mg | ORAL_TABLET | ORAL | 0 refills | Status: DC | PRN

## 2022-12-29 MED ORDER — METHOCARBAMOL 500 MG PO TABS: 500.0000 mg | ORAL_TABLET | Freq: Three times a day (TID) | ORAL | 0 refills | Status: DC | PRN

## 2022-12-29 MED ORDER — VITAMIN D (ERGOCALCIFEROL) 1.25 MG (50000 UNIT) PO CAPS
50000.0000 [IU] | ORAL_CAPSULE | ORAL | Status: DC
  Administered 2022-12-29: 50000 [IU] via ORAL
  Filled 2022-12-29: qty 1

## 2022-12-29 MED ORDER — ASPIRIN 81 MG PO CHEW: 81.0000 mg | CHEWABLE_TABLET | Freq: Two times a day (BID) | ORAL | 0 refills | Status: AC

## 2022-12-29 MED ORDER — DOCUSATE SODIUM 100 MG PO CAPS: 100.0000 mg | ORAL_CAPSULE | Freq: Two times a day (BID) | ORAL | 0 refills | Status: DC

## 2022-12-29 NOTE — Progress Notes (Signed)
Physical Therapy Treatment Patient Details Name: Victoria Reyes MRN: [id number] DOB: 1950/07/17 Today's Date: 12/29/2022   History of Present Illness 72 y.o. female presents to [facility] hospital on 12/28/2022 for elective R TKA. PMH includes diverticulitis, GERD, HLD, hypothyroidism.    PT Comments  Pt tolerated treatment well today. Pt was able to progress ambulation in hallway and navigate stairs with supervision. No change in DC/DME recs at this time. Pt mobility is safe for DC home.    If plan is discharge home, recommend the following: A little help with walking and/or transfers;A little help with bathing/dressing/bathroom;Assistance with cooking/housework;Assist for transportation;Help with stairs or ramp for entrance   Can travel by private vehicle        Equipment Recommendations  Rolling walker (2 wheels);BSC/3in1    Recommendations for Other Services       Precautions / Restrictions Precautions Precautions: Fall Required Braces or Orthoses: Knee Immobilizer - Right Knee Immobilizer - Right: On when out of bed or walking Restrictions Weight Bearing Restrictions: Yes RLE Weight Bearing: Weight bearing as tolerated     Mobility  Bed Mobility Overal bed mobility: Needs Assistance Bed Mobility: Supine to Sit     Supine to sit: Min assist     General bed mobility comments: Min A from daughter.    Transfers Overall transfer level: Needs assistance Equipment used: Rolling walker (2 wheels) Transfers: Sit to/from Stand Sit to Stand: Contact guard assist           General transfer comment: assist to power up 2/2 lack of R knee flexion in knee immobilizer. CGA for safety and cues for hand placement.    Ambulation/Gait Ambulation/Gait assistance: Supervision Gait Distance (Feet): 300 Feet Assistive device: Rolling walker (2 wheels) Gait Pattern/deviations: Step-through pattern Gait velocity: reduced     General Gait Details: no LOB noted.   Stairs Stairs:  Yes Stairs assistance: Supervision Stair Management: Two rails, Step to pattern, Forwards Number of Stairs: 2 General stair comments: good sequencing. no LOB noted.   Wheelchair Mobility     Tilt Bed    Modified Rankin (Stroke Patients Only)       Balance Overall balance assessment: Needs assistance Sitting-balance support: No upper extremity supported, Feet supported Sitting balance-Leahy Scale: Good     Standing balance support: Single extremity supported, Reliant on assistive device for balance Standing balance-Leahy Scale: Poor                              Cognition Arousal: Alert Behavior During Therapy: WFL for tasks assessed/performed Overall Cognitive Status: Within Functional Limits for tasks assessed                                          Exercises      General Comments General comments (skin integrity, edema, etc.): VSS      Pertinent Vitals/Pain Pain Assessment Pain Assessment: 0-10 Pain Score: 10-Worst pain ever Pain Location: R knee Pain Descriptors / Indicators: Sore Pain Intervention(s): Monitored during session, Patient requesting pain meds-RN notified    Home Living                          Prior Function            PT Goals (current goals can now be found in the  care plan section) Progress towards PT goals: Goals met/education completed, patient discharged from PT    Frequency    Min 1X/week      PT Plan      Co-evaluation              AM-PAC PT "6 Clicks" Mobility   Outcome Measure  Help needed turning from your back to your side while in a flat bed without using bedrails?: A Little Help needed moving from lying on your back to sitting on the side of a flat bed without using bedrails?: A Little Help needed moving to and from a bed to a chair (including a wheelchair)?: A Little Help needed standing up from a chair using your arms (e.g., wheelchair or bedside chair)?: A  Little Help needed to walk in hospital room?: A Little Help needed climbing 3-5 steps with a railing? : A Little 6 Click Score: 18    End of Session Equipment Utilized During Treatment: Gait belt Activity Tolerance: Patient tolerated treatment well Patient left: in bed;with call bell/phone within reach;with family/visitor present Nurse Communication: Mobility status PT Visit Diagnosis: Other abnormalities of gait and mobility (R26.89);Muscle weakness (generalized) (M62.81);Pain Pain - Right/Left: Right Pain - part of body: Knee     Time: 1610-9604 PT Time Calculation (min) (ACUTE ONLY): 33 min  Charges:    $Gait Training: 23-37 mins PT General Charges $$ ACUTE PT VISIT: 1 Visit                     Shela Nevin, PT, DPT Acute Rehab Services [phone]    Gladys Damme 12/29/2022, 11:48 AM

## 2022-12-29 NOTE — TOC Transition Note (Signed)
Transition of Care [facility]) - CM/SW Discharge Note   Patient Details  Name: Victoria Reyes MRN: [id number] Date of Birth: 1950-07-17  [facility] CM/SW Contact:  Epifanio Lesches, RN Phone Number: 12/29/2022, 12:49 PM   Clinical Narrative:    Patient will DC to: home Anticipated DC date: 12/29/2022 Family notified: yes Transport by: car        - s/p R TKA , 9/17 Per MD patient ready for DC today. RN, patient, and patient's daughter aware of DC. Home health services to be provided by [facility], prearranged by provider's office. Ortho nurse Sheri states CPM wil be delivered to pt home today. DME : RW provided to pt from [employer].  Daughter to assist with care once d/c.  Pt without RX med concern. Post hospital f/u noted on AVS. Daughter to provide transportation to home.  RNCM will sign off for now as intervention is no longer needed. Please consult [place] again if new needs arise.    Final next level of care: Home w Home Health Services Barriers to Discharge: No Barriers Identified   Patient Goals and CMS Choice      Discharge Placement                         Discharge Plan and Services Additional resources added to the After Visit Summary for                  DME Arranged: Walker rolling DME Agency: [employer] Date DME Agency Contacted: 12/29/22 Time DME Agency Contacted: 1249 Representative spoke with at DME Agency: [employer] HH Arranged: PT HH Agency: [facility] Date [facility] Agency Contacted: 12/29/22 Time HH Agency Contacted: 1248 Representative spoke with at [facility] Agency: Amy  Social Determinants of Health (SDOH) Interventions SDOH Screenings   Food Insecurity: No Food Insecurity (12/28/2022)  Housing: Low Risk  (12/28/2022)  Transportation Needs: No Transportation Needs (12/28/2022)  Utilities: Not At Risk (12/28/2022)  Alcohol Screen: Low Risk  (01/17/2020)  Depression (PHQ2-9): Low Risk  (10/29/2022)  Financial Resource  Strain: Low Risk  (03/11/2021)  Physical Activity: Inactive (03/11/2021)  Social Connections: Unknown (03/11/2021)  Stress: No Stress Concern Present (01/17/2020)  Tobacco Use: Medium Risk (12/28/2022)     Readmission Risk Interventions     No data to display

## 2022-12-29 NOTE — Progress Notes (Signed)
  Subjective: Victoria Reyes is a 72 y.o. female s/p right TKA.  They are POD 1.  Pt's pain is controlled but moderate.  Pt denies any complain of chest pain, shortness of breath, abdominal pain, calf pain.  Patient denies any fevers or chills.  She was able to ambulate about 80 feet with physical therapy yesterday.  She has not work with therapy yet today.  She has about 4 stairs to get into her house.  Objective: Vital signs in last 24 hours: Temp:  [97.3 F (36.3 C)-98.4 F (36.9 C)] 98.1 F (36.7 C) (09/18 0834) Pulse Rate:  [60-118] 85 (09/18 0834) Resp:  [9-24] 17 (09/18 0834) BP: (96-155)/(50-91) 115/56 (09/18 0834) SpO2:  [91 %-99 %] 94 % (09/18 0834) Weight:  [90.7 kg] 90.7 kg (09/17 0954)  Intake/Output from previous day: 09/17 0701 - 09/18 0700 In: 1299 [I.V.:1049; IV Piggyback:250] Out: 425 [Urine:350; Blood:75] Intake/Output this shift: No intake/output data recorded.  Exam:  No gross blood or drainage overlying the dressing 2+ DP pulse Sensation intact distally in the operative foot Able to dorsiflex and plantarflex the operative foot No calf tenderness.  Negative Homans' sign. Able to perform straight leg raise   Labs: No results for input(s): "HGB" in the last 72 hours. No results for input(s): "WBC", "RBC", "HCT", "PLT" in the last 72 hours. No results for input(s): "NA", "K", "CL", "CO2", "BUN", "CREATININE", "GLUCOSE", "CALCIUM" in the last 72 hours. No results for input(s): "LABPT", "INR" in the last 72 hours.  Assessment/Plan: Pt is POD 1 s/p TKA.    -Plan to discharge to home today or tomorrow pending patient's pain and PT eval  -WBAT with a walker  -Follow-up with Dr. August Saucer in clinic 2 weeks postoperatively    [facility] 12/29/2022, 8:36 AM

## 2022-12-29 NOTE — Plan of Care (Signed)
  Problem: Education: Goal: Knowledge of General Education information will improve Description: Including pain rating scale, medication(s)/side effects and non-pharmacologic comfort measures Outcome: Progressing   Problem: Health Behavior/Discharge Planning: Goal: Ability to manage health-related needs will improve Outcome: Progressing   Problem: Clinical Measurements: Goal: Ability to maintain clinical measurements within normal limits will improve Outcome: Progressing   Problem: Clinical Measurements: Goal: Ability to maintain clinical measurements within normal limits will improve Outcome: Progressing   Problem: Education: Goal: Knowledge of General Education information will improve Description: Including pain rating scale, medication(s)/side effects and non-pharmacologic comfort measures Outcome: Progressing   Problem: Elimination: Goal: Will not experience complications related to bowel motility Outcome: Progressing   Problem: Pain Managment: Goal: General experience of comfort will improve Outcome: Progressing

## 2022-12-30 ENCOUNTER — Telehealth: Payer: Self-pay | Admitting: *Deleted

## 2022-12-30 DIAGNOSIS — I10 Essential (primary) hypertension: Secondary | ICD-10-CM | POA: Diagnosis not present

## 2022-12-30 DIAGNOSIS — E039 Hypothyroidism, unspecified: Secondary | ICD-10-CM | POA: Diagnosis not present

## 2022-12-30 DIAGNOSIS — E785 Hyperlipidemia, unspecified: Secondary | ICD-10-CM | POA: Diagnosis not present

## 2022-12-30 DIAGNOSIS — Z471 Aftercare following joint replacement surgery: Secondary | ICD-10-CM | POA: Diagnosis not present

## 2022-12-30 DIAGNOSIS — Z6834 Body mass index (BMI) 34.0-34.9, adult: Secondary | ICD-10-CM | POA: Diagnosis not present

## 2022-12-30 DIAGNOSIS — G609 Hereditary and idiopathic neuropathy, unspecified: Secondary | ICD-10-CM | POA: Diagnosis not present

## 2022-12-30 DIAGNOSIS — K219 Gastro-esophageal reflux disease without esophagitis: Secondary | ICD-10-CM | POA: Diagnosis not present

## 2022-12-30 DIAGNOSIS — Z96651 Presence of right artificial knee joint: Secondary | ICD-10-CM | POA: Diagnosis not present

## 2022-12-30 NOTE — Telephone Encounter (Signed)
Attempted D/C call to patient to check status; left VM and requested call back.

## 2022-12-31 ENCOUNTER — Telehealth: Payer: Self-pay | Admitting: *Deleted

## 2022-12-31 NOTE — Telephone Encounter (Signed)
Attempted 2nd Ortho bundle D/C call to patient; no answer and left VM requesting call back.

## 2023-01-03 DIAGNOSIS — Z96651 Presence of right artificial knee joint: Secondary | ICD-10-CM | POA: Diagnosis not present

## 2023-01-03 DIAGNOSIS — E039 Hypothyroidism, unspecified: Secondary | ICD-10-CM | POA: Diagnosis not present

## 2023-01-03 DIAGNOSIS — I10 Essential (primary) hypertension: Secondary | ICD-10-CM | POA: Diagnosis not present

## 2023-01-03 DIAGNOSIS — K219 Gastro-esophageal reflux disease without esophagitis: Secondary | ICD-10-CM | POA: Diagnosis not present

## 2023-01-03 DIAGNOSIS — Z471 Aftercare following joint replacement surgery: Secondary | ICD-10-CM | POA: Diagnosis not present

## 2023-01-03 DIAGNOSIS — G609 Hereditary and idiopathic neuropathy, unspecified: Secondary | ICD-10-CM | POA: Diagnosis not present

## 2023-01-03 DIAGNOSIS — Z6834 Body mass index (BMI) 34.0-34.9, adult: Secondary | ICD-10-CM | POA: Diagnosis not present

## 2023-01-03 DIAGNOSIS — E785 Hyperlipidemia, unspecified: Secondary | ICD-10-CM | POA: Diagnosis not present

## 2023-01-04 NOTE — Discharge Summary (Signed)
Physician Discharge Summary      Patient ID: Victoria Reyes MRN: [id number] DOB/AGE: 17-Feb-1951 72 y.o.  Admit date: 12/28/2022 Discharge date: 12/29/22  Admission Diagnoses:  Principal Problem:   OA (osteoarthritis) of knee Active Problems:   S/P total knee arthroplasty, right   Discharge Diagnoses:  Same  Surgeries: Procedure(s): TOTAL KNEE ARTHROPLASTY on 12/28/2022   Consultants:   Discharged Condition: Stable  Hospital Course: Victoria Reyes is an 72 y.o. female who was admitted 12/28/2022 with a chief complaint of knee pain, and found to have a diagnosis of right knee arthritis.  They were brought to the operating room on 12/28/2022 and underwent the above named procedures.  Pt awoke from anesthesia without complication and was transferred to the floor. On POD1, patient's pain was controlled and she ambulated well with PT. No red flag symptoms thorughout her stay. Discharged home on POD1.  Pt will f/u with Dr. August Saucer in clinic in ~2 weeks.   Antibiotics given:  Anti-infectives (From admission, onward)    Start     Dose/Rate Route Frequency Ordered Stop   12/28/22 2000  ceFAZolin (ANCEF) IVPB 2g/100 mL premix        2 g 200 mL/hr over 30 Minutes Intravenous Every 8 hours 12/28/22 1506 12/29/22 0653   12/28/22 1250  vancomycin (VANCOCIN) powder  Status:  Discontinued          As needed 12/28/22 1250 12/28/22 1436   12/28/22 0945  ceFAZolin (ANCEF) IVPB 2g/100 mL premix        2 g 200 mL/hr over 30 Minutes Intravenous On call to O.R. 12/28/22 0940 12/28/22 1156   12/28/22 0945  ceFAZolin (ANCEF) 2-4 GM/100ML-% IVPB       Note to Pharmacy: Isabel Caprice, Dest[place]: cabinet override      12/28/22 0945 12/28/22 1157     .  Recent vital signs:  Vitals:   12/29/22 0500 12/29/22 0834  BP: 130/79 (!) 115/56  Pulse: 78 85  Resp: 15 17  Temp:  98.1 F (36.7 C)  SpO2: 99% 94%    Recent laboratory studies:  Results for orders placed or performed during the hospital encounter  of 12/28/22  VITAMIN D 25 Hydroxy (Vit-D Deficiency, Fractures)  Result Value Ref Range   Vit D, 25-Hydroxy 36.84 30 - 100 ng/mL    Discharge Medications:   Allergies as of 12/29/2022       Reactions   Nsaids Other (See Comments)   Bleeding gastric and duodenal ulcers   Hydrocodone Nausea Only   Can not take anything with " Done" on the end   Sulfa Antibiotics Itching        Medication List     TAKE these medications    aspirin 81 MG chewable tablet Chew 1 tablet (81 mg total) by mouth 2 (two) times daily. To prevent blood clots. Take with food   docusate sodium 100 MG capsule Commonly known as: COLACE Take 1 capsule (100 mg total) by mouth 2 (two) times daily.   HYDROmorphone 2 MG tablet Commonly known as: DILAUDID Take 0.5-1 tablets (1-2 mg total) by mouth every 4 (four) hours as needed for severe pain.   levothyroxine 88 MCG tablet Commonly known as: SYNTHROID Take 1 tablet (88 mcg total) by mouth daily.   methocarbamol 500 MG tablet Commonly known as: ROBAXIN Take 1 tablet (500 mg total) by mouth every 8 (eight) hours as needed for muscle spasms.   omeprazole 40 MG capsule Commonly known as: PRILOSEC Take  1 capsule (40 mg total) by mouth daily. What changed:  when to take this reasons to take this   Vitamin D 50 MCG (2000 UT) Caps Take 2,000 Units by mouth in the morning.        Diagnostic Studies: No results found.  Disposition: Discharge disposition: 01-Home or Self Care       Discharge Instructions     Call MD / Call 911   Complete by: As directed    If you experience chest pain or shortness of breath, CALL 911 and be transported to the hospital emergency room.  If you develope a fever above 101 F, pus (white drainage) or increased drainage or redness at the wound, or calf pain, call your surgeon's office.   Constipation Prevention   Complete by: As directed    Drink plenty of fluids.  Prune juice may be helpful.  You may use a stool  softener, such as Colace (over the counter) 100 mg twice a day.  Use MiraLax (over the counter) for constipation as needed.   Diet - low sodium heart healthy   Complete by: As directed    Discharge instructions   Complete by: As directed    You may shower, dressing is waterproof.  Do not remove the dressing, we will remove it at your first post-op appointment.  Do not take a bath or soak the knee in a tub or pool.  You may weightbear as you can tolerate on the operative leg with a walker.  Continue using the CPM machine 3 times per day for one hour each time, increasing the degrees of range of motion daily.  Use the blue cradle boot under your heel to work on getting your leg straight.  Do NOT put a pillow under your knee.  You will follow-up with Dr. August Saucer in the clinic in 2 weeks at your given appointment date.    INSTRUCTIONS AFTER JOINT REPLACEMENT   Remove items at home which could result in a fall. This includes throw rugs or furniture in walking pathways ICE to the affected joint every three hours while awake for 30 minutes at a time, for at least the first 3-5 days, and then as needed for pain and swelling.  Continue to use ice for pain and swelling. You may notice swelling that will progress down to the foot and ankle.  This is normal after surgery.  Elevate your leg when you are not up walking on it.   Continue to use the breathing machine you got in the hospital (incentive spirometer) which will help keep your temperature down.  It is common for your temperature to cycle up and down following surgery, especially at night when you are not up moving around and exerting yourself.  The breathing machine keeps your lungs expanded and your temperature down.   DIET:  As you were doing prior to hospitalization, we recommend a well-balanced diet.  DRESSING / WOUND CARE / SHOWERING  Keep the surgical dressing until follow up.  The dressing is water proof, so you can shower without any extra  covering.  IF THE DRESSING FALLS OFF or the wound gets wet inside, change the dressing with sterile gauze.  Please use good hand washing techniques before changing the dressing.  Do not use any lotions or creams on the incision until instructed by your surgeon.    ACTIVITY  Increase activity slowly as tolerated, but follow the weight bearing instructions below.   No driving for 6 weeks  or until further direction given by your physician.  You cannot drive while taking narcotics.  No lifting or carrying greater than 10 lbs. until further directed by your surgeon. Avoid periods of inactivity such as sitting longer than an hour when not asleep. This helps prevent blood clots.  You may return to work once you are authorized by your doctor.     WEIGHT BEARING   Weight bearing as tolerated with assist device (walker, cane, etc) as directed, use it as long as suggested by your surgeon or therapist, typically at least 4-6 weeks.   EXERCISES  Results after joint replacement surgery are often greatly improved when you follow the exercise, range of motion and muscle strengthening exercises prescribed by your doctor. Safety measures are also important to protect the joint from further injury. Any time any of these exercises cause you to have increased pain or swelling, decrease what you are doing until you are comfortable again and then slowly increase them. If you have problems or questions, call your caregiver or physical therapist for advice.   Rehabilitation is important following a joint replacement. After just a few days of immobilization, the muscles of the leg can become weakened and shrink (atrophy).  These exercises are designed to build up the tone and strength of the thigh and leg muscles and to improve motion. Often times heat used for twenty to thirty minutes before working out will loosen up your tissues and help with improving the range of motion but do not use heat for the first two weeks  following surgery (sometimes heat can increase post-operative swelling).   These exercises can be done on a training (exercise) mat, on the floor, on a table or on a bed. Use whatever works the best and is most comfortable for you.    Use music or television while you are exercising so that the exercises are a pleasant break in your day. This will make your life better with the exercises acting as a break in your routine that you can look forward to.   Perform all exercises about fifteen times, three times per day or as directed.  You should exercise both the operative leg and the other leg as well.  Exercises include:   Quad Sets - Tighten up the muscle on the front of the thigh (Quad) and hold for 5-10 seconds.   Straight Leg Raises - With your knee straight (if you were given a brace, keep it on), lift the leg to 60 degrees, hold for 3 seconds, and slowly lower the leg.  Perform this exercise against resistance later as your leg gets stronger.  Leg Slides: Lying on your back, slowly slide your foot toward your buttocks, bending your knee up off the floor (only go as far as is comfortable). Then slowly slide your foot back down until your leg is flat on the floor again.  Angel Wings: Lying on your back spread your legs to the side as far apart as you can without causing discomfort.  Hamstring Strength:  Lying on your back, push your heel against the floor with your leg straight by tightening up the muscles of your buttocks.  Repeat, but this time bend your knee to a comfortable angle, and push your heel against the floor.  You may put a pillow under the heel to make it more comfortable if necessary.   A rehabilitation program following joint replacement surgery can speed recovery and prevent re-injury in the future due to weakened muscles.  Contact your doctor or a physical therapist for more information on knee rehabilitation.    CONSTIPATION  Constipation is defined medically as fewer than three  stools per week and severe constipation as less than one stool per week.  Even if you have a regular bowel pattern at home, your normal regimen is likely to be disrupted due to multiple reasons following surgery.  Combination of anesthesia, postoperative narcotics, change in appetite and fluid intake all can affect your bowels.   YOU MUST use at least one of the following options; they are listed in order of increasing strength to get the job done.  They are all available over the counter, and you may need to use some, POSSIBLY even all of these options:    Drink plenty of fluids (prune juice may be helpful) and high fiber foods Colace 100 mg by mouth twice a day  Senokot for constipation as directed and as needed Dulcolax (bisacodyl), take with full glass of water  Miralax (polyethylene glycol) once or twice a day as needed.  If you have tried all these things and are unable to have a bowel movement in the first 3-4 days after surgery call either your surgeon or your primary doctor.    If you experience loose stools or diarrhea, hold the medications until you stool forms back up.  If your symptoms do not get better within 1 week or if they get worse, check with your doctor.  If you experience "the worst abdominal pain ever" or develop nausea or vomiting, please contact the office immediately for further recommendations for treatment.   ITCHING:  If you experience itching with your medications, try taking only a single pain pill, or even half a pain pill at a time.  You can also use Benadryl over the counter for itching or also to help with sleep.   TED HOSE STOCKINGS:  Use stockings on both legs until for at least 2 weeks or as directed by physician office. They may be removed at night for sleeping.  MEDICATIONS:  See your medication summary on the "After Visit Summary" that nursing will review with you.  You may have some home medications which will be placed on hold until you complete the course  of blood thinner medication.  It is important for you to complete the blood thinner medication as prescribed.  PRECAUTIONS:  If you experience chest pain or shortness of breath - call 911 immediately for transfer to the hospital emergency department.   If you develop a fever greater that 101 F, purulent drainage from wound, increased redness or drainage from wound, foul odor from the wound/dressing, or calf pain - CONTACT YOUR SURGEON.                                                   FOLLOW-UP APPOINTMENTS:  If you do not already have a post-op appointment, please call the office for an appointment to be seen by your surgeon.  Guidelines for how soon to be seen are listed in your "After Visit Summary", but are typically between 1-4 weeks after surgery.  OTHER INSTRUCTIONS:   Knee Replacement:  Do not place pillow under knee, focus on keeping the knee straight while resting. CPM instructions: 0-90 degrees, 2 hours in the morning, 2 hours in the afternoon, and 2 hours in the evening.  Place foam block, curve side up under heel at all times except when in CPM or when walking.  DO NOT modify, tear, cut, or change the foam block in any way.  POST-OPERATIVE OPIOID TAPER INSTRUCTIONS: It is important to wean off of your opioid medication as soon as possible. If you do not need pain medication after your surgery it is ok to stop day one. Opioids include: Codeine, Hydrocodone(Norco, Vicodin), Oxycodone(Percocet, oxycontin) and hydromorphone amongst others.  Long term and even short term use of opiods can cause: Increased pain response Dependence Constipation Depression Respiratory depression And more.  Withdrawal symptoms can include Flu like symptoms Nausea, vomiting And more Techniques to manage these symptoms Hydrate well Eat regular healthy meals Stay active Use relaxation techniques(deep breathing, meditating, yoga) Do Not substitute Alcohol to help with tapering If you have been on  opioids for less than two weeks and do not have pain than it is ok to stop all together.  Plan to wean off of opioids This plan should start within one week post op of your joint replacement. Maintain the same interval or time between taking each dose and first decrease the dose.  Cut the total daily intake of opioids by one tablet each day Next start to increase the time between doses. The last dose that should be eliminated is the evening dose.   MAKE SURE YOU:  Understand these instructions.  Get help right away if you are not doing well or get worse.    Thank you for letting us be a part of your medical care team.  It is a privilege we respect greatly.  We hope these instructions will help you stay on track for a fast and full recovery!    Dental Antibiotics:  In most cases prophylactic antibiotics for Dental procdeures after total joint surgery are not necessary.  Exceptions are as follows:  1. History of prior total joint infection  2. Severely immunocompromised (Organ Transplant, cancer chemotherapy, Rheumatoid biologic meds such as Humera)  3. Poorly controlled diabetes (A1C &gt; 8.0, blood glucose over 200)  If you have one of these conditions, contact your surgeon for an antibiotic prescription, prior to your dental procedure.   Increase activity slowly as tolerated   Complete by: As directed    Post-operative opioid taper instructions:   Complete by: As directed    POST-OPERATIVE OPIOID TAPER INSTRUCTIONS: It is important to wean off of your opioid medication as soon as possible. If you do not need pain medication after your surgery it is ok to stop day one. Opioids include: Codeine, Hydrocodone(Norco, Vicodin), Oxycodone(Percocet, oxycontin) and hydromorphone amongst others.  Long term and even short term use of opiods can cause: Increased pain response Dependence Constipation Depression Respiratory depression And more.  Withdrawal symptoms can include Flu  like symptoms Nausea, vomiting And more Techniques to manage these symptoms Hydrate well Eat regular healthy meals Stay active Use relaxation techniques(deep breathing, meditating, yoga) Do Not substitute Alcohol to help with tapering If you have been on opioids for less than two weeks and do not have pain than it is ok to stop all together.  Plan to wean off of opioids This plan should start within one week post op of your joint replacement. Maintain the same interval or time between taking each dose and first decrease the dose.  Cut the total daily intake of opioids by one tablet each day Next start to increase the time between doses. The last dose that should be eliminated  is the evening dose.           Follow-up Information     Kerri Perches, MD Follow up.   Specialty: Family Medicine Contact information: [address] [place] [address] [phone]         [facility] Follow up.   Why: home health services will be provided by [facility], start of carewithin 48 hours post discharge Contact information: [address] [place] [address] [phone]         [facility] Follow up.   Why: Home CPM equipment will be delivered to home today. Questions please call [phone] Contact information: [phone]                 Signed: Karenann Cai 01/04/2023, 8:19 AM

## 2023-01-05 ENCOUNTER — Encounter: Payer: Self-pay | Admitting: Orthopedic Surgery

## 2023-01-05 DIAGNOSIS — Z471 Aftercare following joint replacement surgery: Secondary | ICD-10-CM | POA: Diagnosis not present

## 2023-01-05 DIAGNOSIS — Z96651 Presence of right artificial knee joint: Secondary | ICD-10-CM | POA: Diagnosis not present

## 2023-01-05 DIAGNOSIS — E785 Hyperlipidemia, unspecified: Secondary | ICD-10-CM | POA: Diagnosis not present

## 2023-01-05 DIAGNOSIS — Z6834 Body mass index (BMI) 34.0-34.9, adult: Secondary | ICD-10-CM | POA: Diagnosis not present

## 2023-01-05 DIAGNOSIS — K219 Gastro-esophageal reflux disease without esophagitis: Secondary | ICD-10-CM | POA: Diagnosis not present

## 2023-01-05 DIAGNOSIS — I10 Essential (primary) hypertension: Secondary | ICD-10-CM | POA: Diagnosis not present

## 2023-01-05 DIAGNOSIS — G609 Hereditary and idiopathic neuropathy, unspecified: Secondary | ICD-10-CM | POA: Diagnosis not present

## 2023-01-05 DIAGNOSIS — E039 Hypothyroidism, unspecified: Secondary | ICD-10-CM | POA: Diagnosis not present

## 2023-01-06 ENCOUNTER — Telehealth: Payer: Self-pay | Admitting: Surgical

## 2023-01-06 NOTE — Telephone Encounter (Signed)
Patient called needing Rx refilled Hydromorphone. Patient said she has 1 tab left. The number to contact patient is [phone]

## 2023-01-07 ENCOUNTER — Other Ambulatory Visit: Payer: Self-pay | Admitting: Surgical

## 2023-01-07 ENCOUNTER — Telehealth: Payer: Self-pay | Admitting: Orthopedic Surgery

## 2023-01-07 MED ORDER — HYDROMORPHONE HCL 2 MG PO TABS: 1.0000 mg | ORAL_TABLET | Freq: Four times a day (QID) | ORAL | 0 refills | Status: DC | PRN

## 2023-01-07 NOTE — Telephone Encounter (Signed)
Leah (PT) from [facility] called states updates on pt condition. Pt has no compression socks. None was given at hospital, pain is uncontrolled and asking if pt can add tylenol for pain, and low on pain meds, Seen on chart pain meds was sent today. Leah phone number is [phone].

## 2023-01-07 NOTE — Telephone Encounter (Signed)
Patient called needing Rx refilled Hydromorphone. Patient said she has 1 tab left. The number to contact patient is [phone]   Please send refill. Please call pt when sent. Pt phone number above

## 2023-01-07 NOTE — Telephone Encounter (Signed)
Called and advised.

## 2023-01-07 NOTE — Telephone Encounter (Signed)
Sent in

## 2023-01-08 DIAGNOSIS — E785 Hyperlipidemia, unspecified: Secondary | ICD-10-CM | POA: Diagnosis not present

## 2023-01-08 DIAGNOSIS — Z471 Aftercare following joint replacement surgery: Secondary | ICD-10-CM | POA: Diagnosis not present

## 2023-01-08 DIAGNOSIS — I10 Essential (primary) hypertension: Secondary | ICD-10-CM | POA: Diagnosis not present

## 2023-01-08 DIAGNOSIS — Z6834 Body mass index (BMI) 34.0-34.9, adult: Secondary | ICD-10-CM | POA: Diagnosis not present

## 2023-01-08 DIAGNOSIS — Z96651 Presence of right artificial knee joint: Secondary | ICD-10-CM | POA: Diagnosis not present

## 2023-01-08 DIAGNOSIS — G609 Hereditary and idiopathic neuropathy, unspecified: Secondary | ICD-10-CM | POA: Diagnosis not present

## 2023-01-08 DIAGNOSIS — K219 Gastro-esophageal reflux disease without esophagitis: Secondary | ICD-10-CM | POA: Diagnosis not present

## 2023-01-08 DIAGNOSIS — E039 Hypothyroidism, unspecified: Secondary | ICD-10-CM | POA: Diagnosis not present

## 2023-01-10 DIAGNOSIS — Z6834 Body mass index (BMI) 34.0-34.9, adult: Secondary | ICD-10-CM | POA: Diagnosis not present

## 2023-01-10 DIAGNOSIS — Z96651 Presence of right artificial knee joint: Secondary | ICD-10-CM | POA: Diagnosis not present

## 2023-01-10 DIAGNOSIS — E039 Hypothyroidism, unspecified: Secondary | ICD-10-CM | POA: Diagnosis not present

## 2023-01-10 DIAGNOSIS — E785 Hyperlipidemia, unspecified: Secondary | ICD-10-CM | POA: Diagnosis not present

## 2023-01-10 DIAGNOSIS — I10 Essential (primary) hypertension: Secondary | ICD-10-CM | POA: Diagnosis not present

## 2023-01-10 DIAGNOSIS — K219 Gastro-esophageal reflux disease without esophagitis: Secondary | ICD-10-CM | POA: Diagnosis not present

## 2023-01-10 DIAGNOSIS — Z471 Aftercare following joint replacement surgery: Secondary | ICD-10-CM | POA: Diagnosis not present

## 2023-01-10 DIAGNOSIS — G609 Hereditary and idiopathic neuropathy, unspecified: Secondary | ICD-10-CM | POA: Diagnosis not present

## 2023-01-10 NOTE — Telephone Encounter (Signed)
Okay to take tylenol. Compression socks are a good idea, not sure why she didn't get them in hospital

## 2023-01-11 NOTE — Telephone Encounter (Signed)
Pt comes to see you tomorrow. Do you just want to evaluate then?

## 2023-01-11 NOTE — Telephone Encounter (Signed)
Victoria Reyes it is okay with me

## 2023-01-12 ENCOUNTER — Encounter: Payer: Self-pay | Admitting: Surgical

## 2023-01-12 ENCOUNTER — Ambulatory Visit (INDEPENDENT_AMBULATORY_CARE_PROVIDER_SITE_OTHER): Payer: Medicare HMO | Admitting: Surgical

## 2023-01-12 ENCOUNTER — Other Ambulatory Visit (INDEPENDENT_AMBULATORY_CARE_PROVIDER_SITE_OTHER): Payer: Medicare HMO

## 2023-01-12 ENCOUNTER — Other Ambulatory Visit: Payer: Self-pay | Admitting: Surgical

## 2023-01-12 DIAGNOSIS — I10 Essential (primary) hypertension: Secondary | ICD-10-CM | POA: Diagnosis not present

## 2023-01-12 DIAGNOSIS — G609 Hereditary and idiopathic neuropathy, unspecified: Secondary | ICD-10-CM | POA: Diagnosis not present

## 2023-01-12 DIAGNOSIS — Z96651 Presence of right artificial knee joint: Secondary | ICD-10-CM

## 2023-01-12 DIAGNOSIS — Z471 Aftercare following joint replacement surgery: Secondary | ICD-10-CM | POA: Diagnosis not present

## 2023-01-12 DIAGNOSIS — E785 Hyperlipidemia, unspecified: Secondary | ICD-10-CM | POA: Diagnosis not present

## 2023-01-12 DIAGNOSIS — K219 Gastro-esophageal reflux disease without esophagitis: Secondary | ICD-10-CM | POA: Diagnosis not present

## 2023-01-12 DIAGNOSIS — E039 Hypothyroidism, unspecified: Secondary | ICD-10-CM | POA: Diagnosis not present

## 2023-01-12 DIAGNOSIS — Z6834 Body mass index (BMI) 34.0-34.9, adult: Secondary | ICD-10-CM | POA: Diagnosis not present

## 2023-01-12 MED ORDER — HYDROMORPHONE HCL 2 MG PO TABS: 1.0000 mg | ORAL_TABLET | Freq: Four times a day (QID) | ORAL | 0 refills | Status: DC | PRN

## 2023-01-12 NOTE — Progress Notes (Signed)
Post-Op Visit Note   Patient: Victoria Reyes           Date of Birth: 1951/03/28           MRN: [id number] Visit Date: 01/12/2023 PCP: Kerri Perches, MD   Assessment & Plan:  Chief Complaint:  Chief Complaint  Patient presents with   Right Knee - Follow-up    Right total knee arthroplasty 12/28/2022   Visit Diagnoses:  1. S/P total knee arthroplasty, right     Plan: Victoria Reyes is a 72 y.o. female who presents s/p right total knee arthroplasty on 12/28/2022.  Doing well overall.  They deny any calf pain, shortness of breath, chest pain, abdominal pain.  Pain is overall controlled and taking Dilaudid for pain control.  Taking aspirin for DVT prophylaxis.  Ambulating with walker but sometimes she feels like she does not need it.  Pain is improved compared with prior surgery..   On exam, patient has range of motion 0 degrees extension to 75 degrees knee flexion.  Incision is healing well without evidence of infection or dehiscence.  2+ DP pulse of the operative extremity.  No calf tenderness, negative Homans' sign.  Able to perform straight leg raise.  Intact ankle dorsiflexion.  Plan is start physical therapy outpatient next week.  She is already set up for this by Ralph Dowdy, RN.  Refill pain medication.  Continue with aspirin until she is walking totally normally.  If she notices any gastrointestinal symptoms, she may discontinue aspirin as long as she is fairly mobile.  Follow-up in 4 weeks for clinical recheck with Dr. August Saucer.  Needs to work more on knee flexion but extension is coming along well..    Follow-Up Instructions: Return in about 4 weeks (around 02/09/2023).   Orders:  Orders Placed This Encounter  Procedures   XR Knee 1-2 Views Right   No orders of the defined types were placed in this encounter.   Imaging: No results found.  PMFS History: Patient Active Problem List   Diagnosis Date Noted   OA (osteoarthritis) of knee 12/28/2022   S/P total knee  arthroplasty, right 12/28/2022   Low vitamin D level 11/01/2022   Leg swelling 06/28/2022   Giardiasis 08/23/2021   Tapeworm infection 08/23/2021   Leg pain, left 11/17/2020   Alopecia 05/06/2020   History of Helicobacter pylori infection 04/09/2020   Anemia 04/09/2020   History of adenomatous polyp of colon 04/09/2020   Gastric ulcer 01/29/2020   Duodenal ulcer 01/29/2020   Knee pain, right 11/08/2019   Sacro ilial pain 11/08/2019   Chronic back pain 09/11/2019   Immunization refused 03/11/2019   Encounter for annual physical exam 07/10/2015   Cutaneous wart 09/30/2014   Hyperlipidemia with target LDL less than 100 05/04/2012   GERD (gastroesophageal reflux disease) 03/14/2011   Chronic right-sided low back pain with right-sided sciatica 06/25/2009   Morbid obesity (HCC) 02/17/2007   Hypothyroidism 06/14/2006   Diverticulosis of colon 03/01/2006   HIATAL HERNIA WITH REFLUX, HX OF 03/01/2006   Past Medical History:  Diagnosis Date   Anemia    as a  young woman   Diverticulitis 2005   CT    DJD (degenerative joint disease)    GERD (gastroesophageal reflux disease)    H. pylori infection 01/2020   S/p treatment with omeprazole, amoxicillin, and Biaxin.   Headache(784.0)    Hyperlipidemia    Hypothyroidism    Knee pain, right    Low back pain  Neuropathy    peroneal nerve   Neuropathy    left lower leg from back surgery per patient   Obesity    Pneumonia    as child   S/P colonoscopy 2005   Dr. Katrinka Blazing: sigmoid diverticulosis   Thyroid disease    Tobacco abuse     Family History  Problem Relation Age of Onset   Heart attack Sister        pacemaker   Kidney failure Sister        on dialysis   Colon cancer Neg Hx    Stomach cancer Neg Hx    Esophageal cancer Neg Hx     Past Surgical History:  Procedure Laterality Date   BACK SURGERY     BIOPSY  01/29/2020   Procedure: BIOPSY;  Surgeon: Dolores Frame, MD;  Location: [facility];  Service:  Gastroenterology;;   BIOPSY  05/13/2020   Procedure: BIOPSY;  Surgeon: Lanelle Bal, DO;  Location: [facility] ENDOSCOPY;  Service: Endoscopy;;   COLONOSCOPY  12/18/2010   Procedure: COLONOSCOPY;  Surgeon: Arlyce Harman, MD; 3 mm sessile polyp at hepatic flexure, pancolonic diverticulosis, internal hemorrhoids, anal lesion just above the dentate line biopsied via cold forceps.  Colonic polyp was a tubular adenoma.  Anus biopsy with mild active inflammation associated with reactive epithelial changes and hemorrhage.  Recommended repeat colonoscopy in 2022.   COLONOSCOPY WITH PROPOFOL N/A 05/13/2020   Procedure: COLONOSCOPY WITH PROPOFOL;  Surgeon: Lanelle Bal, DO;  Location: [facility] ENDOSCOPY;  Service: Endoscopy;  Laterality: N/A;  12:30PM   cyst removal right wrist     ESOPHAGOGASTRODUODENOSCOPY (EGD) WITH PROPOFOL N/A 01/29/2020   Procedure: ESOPHAGOGASTRODUODENOSCOPY (EGD) WITH PROPOFOL;  Surgeon: Marguerita Merles, Reuel Boom, MD;   3 gastric ulcers with clean base, 5 ulcers in the duodenal bulb and first portion of duodenum, 2 were noted to be oozing, treated with bipolar cautery.  Gastric biopsies consistent with H. pylori gastritis.   ESOPHAGOGASTRODUODENOSCOPY (EGD) WITH PROPOFOL N/A 05/13/2020   Procedure: ESOPHAGOGASTRODUODENOSCOPY (EGD) WITH PROPOFOL;  Surgeon: Lanelle Bal, DO;  Location: [facility] ENDOSCOPY;  Service: Endoscopy;  Laterality: N/A;   EYE SURGERY Bilateral    cataract surgery with lens implant   POLYPECTOMY  05/13/2020   Procedure: POLYPECTOMY;  Surgeon: Lanelle Bal, DO;  Location: [facility] ENDOSCOPY;  Service: Endoscopy;;   TONSILLECTOMY     TOTAL KNEE ARTHROPLASTY Right 12/28/2022   Procedure: TOTAL KNEE ARTHROPLASTY;  Surgeon: Cammy Copa, MD;  Location: [facility] OR;  Service: Orthopedics;  Laterality: Right;   TOTAL SHOULDER ARTHROPLASTY Left 12/21/2016   Procedure: TOTAL SHOULDER ARTHROPLASTY;  Surgeon: Cammy Copa, MD;  Location: [facility] OR;  Service: Orthopedics;  Laterality:  Left;   TUBAL LIGATION     umbillical hernia     Social History   Occupational History   Not on file  Tobacco Use   Smoking status: Former    Current packs/day: 0.00    Average packs/day: 0.3 packs/day for 51.9 years (13.0 ttl pk-yrs)    Types: Cigarettes    Start date: 09/30/1963    Quit date: 08/15/2015    Years since quitting: 7.4   Smokeless tobacco: Never  Vaping Use   Vaping status: Never Used  Substance and Sexual Activity   Alcohol use: No    Alcohol/week: 0.0 standard drinks of alcohol   Drug use: No   Sexual activity: Not Currently    Birth control/protection: Post-menopausal

## 2023-01-13 ENCOUNTER — Telehealth: Payer: Self-pay | Admitting: *Deleted

## 2023-01-13 NOTE — Telephone Encounter (Signed)
Ortho bundle 14 day in office appt completed.

## 2023-01-20 ENCOUNTER — Encounter (HOSPITAL_COMMUNITY): Payer: Self-pay

## 2023-01-20 ENCOUNTER — Other Ambulatory Visit: Payer: Self-pay

## 2023-01-20 ENCOUNTER — Ambulatory Visit (HOSPITAL_COMMUNITY): Payer: Medicare HMO | Attending: Orthopedic Surgery

## 2023-01-20 DIAGNOSIS — Z7409 Other reduced mobility: Secondary | ICD-10-CM | POA: Diagnosis not present

## 2023-01-20 DIAGNOSIS — M25661 Stiffness of right knee, not elsewhere classified: Secondary | ICD-10-CM | POA: Insufficient documentation

## 2023-01-20 DIAGNOSIS — M1711 Unilateral primary osteoarthritis, right knee: Secondary | ICD-10-CM | POA: Diagnosis not present

## 2023-01-20 NOTE — Therapy (Addendum)
OUTPATIENT PHYSICAL THERAPY LOWER EXTREMITY EVALUATION   Patient Name: Victoria Reyes MRN: [id number] DOB:02-28-1951, 72 y.o., female Today's Date: 01/20/2023  END OF SESSION:  PT End of Session - 01/20/23 0852     Visit Number 1    Date for PT Re-Evaluation 03/17/23    Authorization Type [employer] medicare    Authorization Time Period seeking new auth 01/20/23    Authorization - Visit Number 1    PT Start Time 0801    PT Stop Time 0844    PT Time Calculation (min) 43 min    Activity Tolerance Patient tolerated treatment well    Behavior During Therapy [facility] for tasks assessed/performed             Past Medical History:  Diagnosis Date   Anemia    as a  young woman   Diverticulitis 2005   CT    DJD (degenerative joint disease)    GERD (gastroesophageal reflux disease)    H. pylori infection 01/2020   S/p treatment with omeprazole, amoxicillin, and Biaxin.   Headache(784.0)    Hyperlipidemia    Hypothyroidism    Knee pain, right    Low back pain    Neuropathy    peroneal nerve   Neuropathy    left lower leg from back surgery per patient   Obesity    Pneumonia    as child   S/P colonoscopy 2005   Dr. Katrinka Blazing: sigmoid diverticulosis   Thyroid disease    Tobacco abuse    Past Surgical History:  Procedure Laterality Date   BACK SURGERY     BIOPSY  01/29/2020   Procedure: BIOPSY;  Surgeon: Dolores Frame, MD;  Location: [facility];  Service: Gastroenterology;;   BIOPSY  05/13/2020   Procedure: BIOPSY;  Surgeon: Lanelle Bal, DO;  Location: [facility] ENDOSCOPY;  Service: Endoscopy;;   COLONOSCOPY  12/18/2010   Procedure: COLONOSCOPY;  Surgeon: Arlyce Harman, MD; 3 mm sessile polyp at hepatic flexure, pancolonic diverticulosis, internal hemorrhoids, anal lesion just above the dentate line biopsied via cold forceps.  Colonic polyp was a tubular adenoma.  Anus biopsy with mild active inflammation associated with reactive epithelial changes and hemorrhage.   Recommended repeat colonoscopy in 2022.   COLONOSCOPY WITH PROPOFOL N/A 05/13/2020   Procedure: COLONOSCOPY WITH PROPOFOL;  Surgeon: Lanelle Bal, DO;  Location: [facility] ENDOSCOPY;  Service: Endoscopy;  Laterality: N/A;  12:30PM   cyst removal right wrist     ESOPHAGOGASTRODUODENOSCOPY (EGD) WITH PROPOFOL N/A 01/29/2020   Procedure: ESOPHAGOGASTRODUODENOSCOPY (EGD) WITH PROPOFOL;  Surgeon: Marguerita Merles, Reuel Boom, MD;   3 gastric ulcers with clean base, 5 ulcers in the duodenal bulb and first portion of duodenum, 2 were noted to be oozing, treated with bipolar cautery.  Gastric biopsies consistent with H. pylori gastritis.   ESOPHAGOGASTRODUODENOSCOPY (EGD) WITH PROPOFOL N/A 05/13/2020   Procedure: ESOPHAGOGASTRODUODENOSCOPY (EGD) WITH PROPOFOL;  Surgeon: Lanelle Bal, DO;  Location: [facility] ENDOSCOPY;  Service: Endoscopy;  Laterality: N/A;   EYE SURGERY Bilateral    cataract surgery with lens implant   POLYPECTOMY  05/13/2020   Procedure: POLYPECTOMY;  Surgeon: Lanelle Bal, DO;  Location: [facility] ENDOSCOPY;  Service: Endoscopy;;   TONSILLECTOMY     TOTAL KNEE ARTHROPLASTY Right 12/28/2022   Procedure: TOTAL KNEE ARTHROPLASTY;  Surgeon: Cammy Copa, MD;  Location: [facility] OR;  Service: Orthopedics;  Laterality: Right;   TOTAL SHOULDER ARTHROPLASTY Left 12/21/2016   Procedure: TOTAL SHOULDER ARTHROPLASTY;  Surgeon: Rise Paganini  Lorin Picket, MD;  Location: [facility] OR;  Service: Orthopedics;  Laterality: Left;   TUBAL LIGATION     umbillical hernia     Patient Active Problem List   Diagnosis Date Noted   OA (osteoarthritis) of knee 12/28/2022   S/P total knee arthroplasty, right 12/28/2022   Low vitamin D level 11/01/2022   Leg swelling 06/28/2022   Giardiasis 08/23/2021   Tapeworm infection 08/23/2021   Leg pain, left 11/17/2020   Alopecia 05/06/2020   History of Helicobacter pylori infection 04/09/2020   Anemia 04/09/2020   History of adenomatous polyp of colon 04/09/2020   Gastric ulcer  01/29/2020   Duodenal ulcer 01/29/2020   Knee pain, right 11/08/2019   Sacro ilial pain 11/08/2019   Chronic back pain 09/11/2019   Immunization refused 03/11/2019   Encounter for annual physical exam 07/10/2015   Cutaneous wart 09/30/2014   Hyperlipidemia with target LDL less than 100 05/04/2012   GERD (gastroesophageal reflux disease) 03/14/2011   Chronic right-sided low back pain with right-sided sciatica 06/25/2009   Morbid obesity (HCC) 02/17/2007   Hypothyroidism 06/14/2006   Diverticulosis of colon 03/01/2006   HIATAL HERNIA WITH REFLUX, HX OF 03/01/2006    PCP: Syliva Overman MD  REFERRING PROVIDER:   Cammy Copa, MD    REFERRING DIAG: M17.11 (ICD-10-CM) - Unilateral primary osteoarthritis, right knee   THERAPY DIAG:  Arthritis of right knee  Impaired functional mobility, balance, gait, and endurance  Decreased range of motion (ROM) of right knee  Rationale for Evaluation and Treatment: Rehabilitation  ONSET DATE: 12/28/22  SUBJECTIVE:   SUBJECTIVE STATEMENT: Coming in for R TKA. 17th of this Month. Pt received home health PT. Pt reports getting around the house just fine, badges are starting to come off. Pt lives with daughter/son and grandchildren. Before the knee replacement, pt was independent with functional mobility and ADLs. Pt currently utilizing AD for ambulation, not driving currently. Assist from daugther for transfers into tub. Pt known to this clinc with prior TSA.   PERTINENT HISTORY: Lumbar fusion PAIN:  Are you having pain? Yes: NPRS scale: 5/10 Pain location: R knee Pain description: dull pain Aggravating factors: nothing Relieving factors: pain medications, ice  PRECAUTIONS: None  RED FLAGS: None   WEIGHT BEARING RESTRICTIONS: No  FALLS:  Has patient fallen in last 6 months? No  LIVING ENVIRONMENT: Lives with: lives with their family Lives in: House/apartment Stairs: Yes: External: 4 steps; can reach both Has  following equipment at home: Dan Humphreys - 2 wheeled  OCCUPATION: Retired  PLOF: Independent  PATIENT GOALS: "go get grand-baby at school"  NEXT MD VISIT: 02/07/23  OBJECTIVE:  Note: Objective measures were completed at Evaluation unless otherwise noted.  DIAGNOSTIC FINDINGS:   PATIENT SURVEYS:  FOTO 50.1%  COGNITION: Overall cognitive status: Within functional limits for tasks assessed     SENSATION: WFL  POSTURE: No Significant postural limitations  PALPATION: Not tender to Palpation  LOWER EXTREMITY ROM:  Active ROM Right eval Left eval  Hip flexion    Hip extension    Hip abduction    Hip adduction    Hip internal rotation    Hip external rotation    Knee flexion 37 110  Knee extension -5 0  Ankle dorsiflexion    Ankle plantarflexion    Ankle inversion    Ankle eversion     (Blank rows = not tested)  LOWER EXTREMITY MMT:  MMT Right eval Left eval  Hip flexion    Hip extension  Hip abduction    Hip adduction    Hip internal rotation    Hip external rotation    Knee flexion    Knee extension  4-  Ankle dorsiflexion    Ankle plantarflexion    Ankle inversion    Ankle eversion     (Blank rows = not tested)  FUNCTIONAL TESTS:  30 seconds chair stand test: 8x 2 minute walk test: 275ft w/ RW  GAIT: Distance walked: 252ft Assistive device utilized: Walker - 2 wheeled Level of assistance: Modified independence Comments: Mild R circumduction during swing, limited R knee flexion during swing. Mild antalgic gait pattern with RLE   TODAY'S TREATMENT:                                                                                                                              DATE:  01/20/2023 PT Evaluation, findings, HEP, prognosis, and frequency   PATIENT EDUCATION:  Education details: see below Person educated: Patient Education method: Explanation and Demonstration Education comprehension: verbalized understanding  HOME EXERCISE  PROGRAM: Access Code: [email or web address] URL: [email or web address] Date: 01/20/2023 Prepared by: Starling Manns  Exercises - Supine Heel Slide  - 1 x daily - 7 x weekly - 3 sets - 10 reps - Seated Knee Flexion Extension AROM   - 1 x daily - 7 x weekly - 3 sets - 10 reps - Supine Gluteal Sets  - 1 x daily - 7 x weekly - 3 sets - 10 reps - Supine Knee Extension Stretch on Towel Roll  - 1 x daily - 7 x weekly - 3 sets - 10 reps  ASSESSMENT:  CLINICAL IMPRESSION: Patient is a 72 y.o. female who was seen today for physical therapy evaluation and treatment for M17.11 (ICD-10-CM) - Unilateral primary osteoarthritis, right knee. R TKA completed 12/29/22 with 2 weeks of HHPT. Pt independent prior to knee replacement without AD. Currently, pt utilizing RW for all mobility, assistance with transfers for bathing at home.  Pt demonstrating limitations in functional mobility, transfers, ambulation capacity in community settings, due to muscle weakness, ROM limitations, asymmetrical movement patterns and pain. Pt would benefit from skilled PT services to address functional deficits and improve quality of movement and independence.  OBJECTIVE IMPAIRMENTS: Abnormal gait, decreased activity tolerance, decreased balance, decreased mobility, difficulty walking, decreased ROM, decreased strength, hypomobility, increased fascial restrictions, impaired flexibility, and pain.   ACTIVITY LIMITATIONS: carrying, lifting, bending, sitting, standing, squatting, stairs, transfers, and locomotion level  PARTICIPATION LIMITATIONS: driving, shopping, community activity, occupation, and yard work  PERSONAL FACTORS: Age are also affecting patient's functional outcome.   REHAB POTENTIAL: Good  CLINICAL DECISION MAKING: Stable/uncomplicated  EVALUATION COMPLEXITY: Low   GOALS: Goals reviewed with patient? No  SHORT TERM GOALS: Target date: 11/7/024  Pt will be independent with HEP in order to demonstrate  participation in Physical Therapy POC.  Baseline: Goal status: INITIAL  2.  Pt will report 3/10 pain improvement during functional activity n  order to demonstrate improved pain with functional activities.  Baseline:  Goal status: INITIAL  LONG TERM GOALS: Target date: 03/17/2023  Pt will improve 30 second chair test by > 2 reps in order to demonstrate improved functional strength to return to desired activities.  Baseline: 8x Goal status: INITIAL  2.  Pt will improve 2 MWT by >157ft with LRAD in order to demonstrate improved functional ambulatory capacity in community setting.  Baseline: 22ft with RW Goal status: INITIAL  3.  Pt will improve FOTO score by > 5 points in order to demonstrate improved pain with functional goals and outcomes. Baseline: 50.1 Goal status: INITIAL  4.  Pt will improve R knee extension ROM to 0 degrees in order to improve knee mobility during functional activities. Baseline: -5 degrees from 0 Goal status: INITIAL  5.  Pt will improve R knee flexion ROM to >110 degrees in order to improve knee mobility during functional activities. Baseline: 45 degrees currently Goal status: INITIAL    PLAN:  PT FREQUENCY: 2x/week  PT DURATION: 8 weeks  PLANNED INTERVENTIONS: 97146- PT Re-evaluation, 97110-Therapeutic exercises, 97530- Therapeutic activity, 97112- Neuromuscular re-education, 97535- Self Care, 81191- Manual therapy, [phone]- Gait training, Patient/Family education, Balance training, Stair training, and Joint mobilization  PLAN FOR NEXT SESSION: R knee ROM, advance HEP, proximal/distal strengthening of RLE  Victoria Reyes PT, DPT Physical Therapist with [facility] 336 [phone] office  Victoria Reyes, PT 01/20/2023, 8:55 AM

## 2023-01-25 ENCOUNTER — Ambulatory Visit (HOSPITAL_COMMUNITY): Payer: Medicare HMO

## 2023-01-25 DIAGNOSIS — M25661 Stiffness of right knee, not elsewhere classified: Secondary | ICD-10-CM | POA: Diagnosis not present

## 2023-01-25 DIAGNOSIS — Z7409 Other reduced mobility: Secondary | ICD-10-CM

## 2023-01-25 DIAGNOSIS — M1711 Unilateral primary osteoarthritis, right knee: Secondary | ICD-10-CM

## 2023-01-25 NOTE — Therapy (Signed)
OUTPATIENT PHYSICAL THERAPY LOWER EXTREMITY TREATMENT   Patient Name: Victoria Reyes MRN: [id number] DOB:28-May-1950, 72 y.o., female Today's Date: 01/25/2023  END OF SESSION:  PT End of Session - 01/25/23 0854     Visit Number 2    Date for PT Re-Evaluation 03/17/23    Authorization Type [employer] medicare (12 visits approved)    Authorization Time Period 01/20/23-03/17/23    Authorization - Visit Number 1    Authorization - Number of Visits 12    PT Start Time 0845    PT Stop Time 0925    PT Time Calculation (min) 40 min    Activity Tolerance Patient tolerated treatment well    Behavior During Therapy [facility] for tasks assessed/performed            Past Medical History:  Diagnosis Date   Anemia    as a  young woman   Diverticulitis 2005   CT    DJD (degenerative joint disease)    GERD (gastroesophageal reflux disease)    H. pylori infection 01/2020   S/p treatment with omeprazole, amoxicillin, and Biaxin.   Headache(784.0)    Hyperlipidemia    Hypothyroidism    Knee pain, right    Low back pain    Neuropathy    peroneal nerve   Neuropathy    left lower leg from back surgery per patient   Obesity    Pneumonia    as child   S/P colonoscopy 2005   Dr. Katrinka Blazing: sigmoid diverticulosis   Thyroid disease    Tobacco abuse    Past Surgical History:  Procedure Laterality Date   BACK SURGERY     BIOPSY  01/29/2020   Procedure: BIOPSY;  Surgeon: Dolores Frame, MD;  Location: [facility];  Service: Gastroenterology;;   BIOPSY  05/13/2020   Procedure: BIOPSY;  Surgeon: Lanelle Bal, DO;  Location: [facility] ENDOSCOPY;  Service: Endoscopy;;   COLONOSCOPY  12/18/2010   Procedure: COLONOSCOPY;  Surgeon: Arlyce Harman, MD; 3 mm sessile polyp at hepatic flexure, pancolonic diverticulosis, internal hemorrhoids, anal lesion just above the dentate line biopsied via cold forceps.  Colonic polyp was a tubular adenoma.  Anus biopsy with mild active inflammation associated with  reactive epithelial changes and hemorrhage.  Recommended repeat colonoscopy in 2022.   COLONOSCOPY WITH PROPOFOL N/A 05/13/2020   Procedure: COLONOSCOPY WITH PROPOFOL;  Surgeon: Lanelle Bal, DO;  Location: [facility] ENDOSCOPY;  Service: Endoscopy;  Laterality: N/A;  12:30PM   cyst removal right wrist     ESOPHAGOGASTRODUODENOSCOPY (EGD) WITH PROPOFOL N/A 01/29/2020   Procedure: ESOPHAGOGASTRODUODENOSCOPY (EGD) WITH PROPOFOL;  Surgeon: Marguerita Merles, Reuel Boom, MD;   3 gastric ulcers with clean base, 5 ulcers in the duodenal bulb and first portion of duodenum, 2 were noted to be oozing, treated with bipolar cautery.  Gastric biopsies consistent with H. pylori gastritis.   ESOPHAGOGASTRODUODENOSCOPY (EGD) WITH PROPOFOL N/A 05/13/2020   Procedure: ESOPHAGOGASTRODUODENOSCOPY (EGD) WITH PROPOFOL;  Surgeon: Lanelle Bal, DO;  Location: [facility] ENDOSCOPY;  Service: Endoscopy;  Laterality: N/A;   EYE SURGERY Bilateral    cataract surgery with lens implant   POLYPECTOMY  05/13/2020   Procedure: POLYPECTOMY;  Surgeon: Lanelle Bal, DO;  Location: [facility] ENDOSCOPY;  Service: Endoscopy;;   TONSILLECTOMY     TOTAL KNEE ARTHROPLASTY Right 12/28/2022   Procedure: TOTAL KNEE ARTHROPLASTY;  Surgeon: Cammy Copa, MD;  Location: [facility] OR;  Service: Orthopedics;  Laterality: Right;   TOTAL SHOULDER ARTHROPLASTY Left 12/21/2016  Procedure: TOTAL SHOULDER ARTHROPLASTY;  Surgeon: Cammy Copa, MD;  Location: [facility] OR;  Service: Orthopedics;  Laterality: Left;   TUBAL LIGATION     umbillical hernia     Patient Active Problem List   Diagnosis Date Noted   OA (osteoarthritis) of knee 12/28/2022   S/P total knee arthroplasty, right 12/28/2022   Low vitamin D level 11/01/2022   Leg swelling 06/28/2022   Giardiasis 08/23/2021   Tapeworm infection 08/23/2021   Leg pain, left 11/17/2020   Alopecia 05/06/2020   History of Helicobacter pylori infection 04/09/2020   Anemia 04/09/2020   History of adenomatous polyp  of colon 04/09/2020   Gastric ulcer 01/29/2020   Duodenal ulcer 01/29/2020   Knee pain, right 11/08/2019   Sacro ilial pain 11/08/2019   Chronic back pain 09/11/2019   Immunization refused 03/11/2019   Encounter for annual physical exam 07/10/2015   Cutaneous wart 09/30/2014   Hyperlipidemia with target LDL less than 100 05/04/2012   GERD (gastroesophageal reflux disease) 03/14/2011   Chronic right-sided low back pain with right-sided sciatica 06/25/2009   Morbid obesity (HCC) 02/17/2007   Hypothyroidism 06/14/2006   Diverticulosis of colon 03/01/2006   HIATAL HERNIA WITH REFLUX, HX OF 03/01/2006    PCP: Syliva Overman MD  REFERRING PROVIDER:   Cammy Copa, MD    REFERRING DIAG: M17.11 (ICD-10-CM) - Unilateral primary osteoarthritis, right knee   THERAPY DIAG:  Arthritis of right knee  Impaired functional mobility, balance, gait, and endurance  Decreased range of motion (ROM) of right knee  Rationale for Evaluation and Treatment: Rehabilitation  ONSET DATE: 12/28/22  SUBJECTIVE:   SUBJECTIVE STATEMENT: Arrives to  the clinic with pain = 2/10.   EVAL: Coming in for R TKA. 17th of this Month. Pt received home health PT. Pt reports getting around the house just fine, badges are starting to come off. Pt lives with daughter/son and grandchildren. Before the knee replacement, pt was independent with functional mobility and ADLs. Pt currently utilizing AD for ambulation, not driving currently. Assist from daugther for transfers into tub. Pt known to this clinc with prior TSA.   PERTINENT HISTORY: Lumbar fusion PAIN:  Are you having pain? Yes: NPRS scale: 5/10 Pain location: R knee Pain description: dull pain Aggravating factors: nothing Relieving factors: pain medications, ice  PRECAUTIONS: None  RED FLAGS: None   WEIGHT BEARING RESTRICTIONS: No  FALLS:  Has patient fallen in last 6 months? No  LIVING ENVIRONMENT: Lives with: lives with their  family Lives in: House/apartment Stairs: Yes: External: 4 steps; can reach both Has following equipment at home: Dan Humphreys - 2 wheeled  OCCUPATION: Retired  PLOF: Independent  PATIENT GOALS: "go get grand-baby at school"  NEXT MD VISIT: 02/07/23  OBJECTIVE:  Note: Objective measures were completed at Evaluation unless otherwise noted.  DIAGNOSTIC FINDINGS:   PATIENT SURVEYS:  FOTO 50.1%  COGNITION: Overall cognitive status: Within functional limits for tasks assessed     SENSATION: WFL  POSTURE: No Significant postural limitations  PALPATION: Not tender to Palpation  LOWER EXTREMITY ROM:  Active ROM Right eval Left eval  Hip flexion    Hip extension    Hip abduction    Hip adduction    Hip internal rotation    Hip external rotation    Knee flexion 37 110  Knee extension -5 0  Ankle dorsiflexion    Ankle plantarflexion    Ankle inversion    Ankle eversion     (Blank rows = not  tested)  LOWER EXTREMITY MMT:  MMT Right eval Left eval  Hip flexion    Hip extension    Hip abduction    Hip adduction    Hip internal rotation    Hip external rotation    Knee flexion    Knee extension  4-  Ankle dorsiflexion    Ankle plantarflexion    Ankle inversion    Ankle eversion     (Blank rows = not tested)  FUNCTIONAL TESTS:  30 seconds chair stand test: 8x 2 minute walk test: 228ft w/ RW  GAIT: Distance walked: 226ft Assistive device utilized: Walker - 2 wheeled Level of assistance: Modified independence Comments: Mild R circumduction during swing, limited R knee flexion during swing. Mild antalgic gait pattern with RLE   TODAY'S TREATMENT:                                                                                                                              DATE:  01/25/23 NuStep, seat 10 , arm 8, level 1 x 5' Heel slides with a strap x 10 x 3 with 30" hold on flexion on the last rep Supine R SLR x 10 x 2 R LAQ x 3" x 10 x 2 Heel/toe raises  with UE support x 10 x 2 Standing hip abd/ext  with UE support x 10 x 2   01/20/2023 PT Evaluation, findings, HEP, prognosis, and frequency   PATIENT EDUCATION:  Education details: see below Person educated: Patient Education method: Explanation and Demonstration Education comprehension: verbalized understanding  HOME EXERCISE PROGRAM: Access Code: [email or web address] URL: [email or web address] 01/25/2023 - Supine Active Straight Leg Raise  - 1-2 x daily - 7 x weekly - 2 sets - 10 reps - Heel Toe Raises with Counter Support  - 1-2 x daily - 7 x weekly - 2 sets - 10 reps - Seated Long Arc Quad  - 1-2 x daily - 7 x weekly - 2 sets - 10 reps - 3 hold - Standing Hip Extension with Counter Support  - 1-2 x daily - 7 x weekly - 2 sets - 10 reps - Standing Hip Abduction with Counter Support  - 1-2 x daily - 7 x weekly - 2 sets - 10 reps  Date: 01/20/2023 Prepared by: Starling Manns  Exercises - Supine Heel Slide  - 1 x daily - 7 x weekly - 3 sets - 10 reps - Seated Knee Flexion Extension AROM   - 1 x daily - 7 x weekly - 3 sets - 10 reps - Supine Gluteal Sets  - 1 x daily - 7 x weekly - 3 sets - 10 reps - Supine Knee Extension Stretch on Towel Roll  - 1 x daily - 7 x weekly - 3 sets - 10 reps  ASSESSMENT:  CLINICAL IMPRESSION: Interventions today were geared towards LE strengthening and flexibility. Tolerated all activities with slight increase in pain from 3-5/10 especially with the heel slides.  Demonstrated appropriate levels of fatigue. Rest periods provided. Provided mild amount of cueing to ensure correct execution of activity with good carry-over. Left clinic with pain = 3/10. To date, skilled PT is required to address the impairments and improve function.  EVAL: Patient is a 72 y.o. female who was seen today for physical therapy evaluation and treatment for M17.11 (ICD-10-CM) - Unilateral primary osteoarthritis, right knee. R TKA completed 12/29/22 with 2 weeks of HHPT. Pt  independent prior to knee replacement without AD. Currently, pt utilizing RW for all mobility, assistance with transfers for bathing at home.  Pt demonstrating limitations in functional mobility, transfers, ambulation capacity in community settings, due to muscle weakness, ROM limitations, asymmetrical movement patterns and pain. Pt would benefit from skilled PT services to address functional deficits and improve quality of movement and independence.  OBJECTIVE IMPAIRMENTS: Abnormal gait, decreased activity tolerance, decreased balance, decreased mobility, difficulty walking, decreased ROM, decreased strength, hypomobility, increased fascial restrictions, impaired flexibility, and pain.   ACTIVITY LIMITATIONS: carrying, lifting, bending, sitting, standing, squatting, stairs, transfers, and locomotion level  PARTICIPATION LIMITATIONS: driving, shopping, community activity, occupation, and yard work  PERSONAL FACTORS: Age are also affecting patient's functional outcome.   REHAB POTENTIAL: Good  CLINICAL DECISION MAKING: Stable/uncomplicated  EVALUATION COMPLEXITY: Low   GOALS: Goals reviewed with patient? No  SHORT TERM GOALS: Target date: 11/7/024  Pt will be independent with HEP in order to demonstrate participation in Physical Therapy POC.  Baseline: Goal status: INITIAL  2.  Pt will report 3/10 pain improvement during functional activity n order to demonstrate improved pain with functional activities.  Baseline:  Goal status: INITIAL  LONG TERM GOALS: Target date: 03/17/2023  Pt will improve 30 second chair test by > 2 reps in order to demonstrate improved functional strength to return to desired activities.  Baseline: 8x Goal status: INITIAL  2.  Pt will improve 2 MWT by >152ft with LRAD in order to demonstrate improved functional ambulatory capacity in community setting.  Baseline: 217ft with RW Goal status: INITIAL  3.  Pt will improve FOTO score by > 5 points in order to  demonstrate improved pain with functional goals and outcomes. Baseline: 50.1 Goal status: INITIAL  4.  Pt will improve R knee extension ROM to 0 degrees in order to improve knee mobility during functional activities. Baseline: -5 degrees from 0 Goal status: INITIAL  5.  Pt will improve R knee flexion ROM to >110 degrees in order to improve knee mobility during functional activities. Baseline: 45 degrees currently Goal status: INITIAL    PLAN:  PT FREQUENCY: 2x/week  PT DURATION: 8 weeks  PLANNED INTERVENTIONS: 97146- PT Re-evaluation, 97110-Therapeutic exercises, 97530- Therapeutic activity, 97112- Neuromuscular re-education, 97535- Self Care, 25956- Manual therapy, [phone]- Gait training, Patient/Family education, Balance training, Stair training, and Joint mobilization  PLAN FOR NEXT SESSION: Continue POC and may progress as tolerated with emphasis on R knee ROM and strength as well as hip strengthening. May work on balance and gait.  Tish Frederickson. Ajayla Iglesias, PT, DPT, OCS Board-Certified Clinical Specialist in Orthopedic PT PT Compact Privilege # ([place]): [id number] T  01/25/2023, 8:56 AM

## 2023-01-27 ENCOUNTER — Ambulatory Visit (HOSPITAL_COMMUNITY): Payer: Medicare HMO

## 2023-01-27 ENCOUNTER — Encounter (HOSPITAL_COMMUNITY): Payer: Self-pay

## 2023-01-27 DIAGNOSIS — M25661 Stiffness of right knee, not elsewhere classified: Secondary | ICD-10-CM | POA: Diagnosis not present

## 2023-01-27 DIAGNOSIS — M1711 Unilateral primary osteoarthritis, right knee: Secondary | ICD-10-CM | POA: Diagnosis not present

## 2023-01-27 DIAGNOSIS — Z7409 Other reduced mobility: Secondary | ICD-10-CM | POA: Diagnosis not present

## 2023-01-27 NOTE — Therapy (Signed)
OUTPATIENT PHYSICAL THERAPY LOWER EXTREMITY TREATMENT   Patient Name: Victoria Reyes MRN: [id number] DOB:03/01/1951, 72 y.o., female Today's Date: 01/27/2023  END OF SESSION:  PT End of Session - 01/27/23 1011     Visit Number 3    Date for PT Re-Evaluation 03/17/23    Authorization Type [employer] medicare (12 visits approved)    Authorization Time Period 01/20/23-03/17/23    Authorization - Visit Number 2    Authorization - Number of Visits 12    Progress Note Due on Visit 10    PT Start Time 0932    PT Stop Time 1011    PT Time Calculation (min) 39 min    Activity Tolerance Patient tolerated treatment well    Behavior During Therapy [facility] for tasks assessed/performed             Past Medical History:  Diagnosis Date   Anemia    as a  young woman   Diverticulitis 2005   CT    DJD (degenerative joint disease)    GERD (gastroesophageal reflux disease)    H. pylori infection 01/2020   S/p treatment with omeprazole, amoxicillin, and Biaxin.   Headache(784.0)    Hyperlipidemia    Hypothyroidism    Knee pain, right    Low back pain    Neuropathy    peroneal nerve   Neuropathy    left lower leg from back surgery per patient   Obesity    Pneumonia    as child   S/P colonoscopy 2005   Dr. Katrinka Blazing: sigmoid diverticulosis   Thyroid disease    Tobacco abuse    Past Surgical History:  Procedure Laterality Date   BACK SURGERY     BIOPSY  01/29/2020   Procedure: BIOPSY;  Surgeon: Dolores Frame, MD;  Location: [facility];  Service: Gastroenterology;;   BIOPSY  05/13/2020   Procedure: BIOPSY;  Surgeon: Lanelle Bal, DO;  Location: [facility] ENDOSCOPY;  Service: Endoscopy;;   COLONOSCOPY  12/18/2010   Procedure: COLONOSCOPY;  Surgeon: Arlyce Harman, MD; 3 mm sessile polyp at hepatic flexure, pancolonic diverticulosis, internal hemorrhoids, anal lesion just above the dentate line biopsied via cold forceps.  Colonic polyp was a tubular adenoma.  Anus biopsy with mild  active inflammation associated with reactive epithelial changes and hemorrhage.  Recommended repeat colonoscopy in 2022.   COLONOSCOPY WITH PROPOFOL N/A 05/13/2020   Procedure: COLONOSCOPY WITH PROPOFOL;  Surgeon: Lanelle Bal, DO;  Location: [facility] ENDOSCOPY;  Service: Endoscopy;  Laterality: N/A;  12:30PM   cyst removal right wrist     ESOPHAGOGASTRODUODENOSCOPY (EGD) WITH PROPOFOL N/A 01/29/2020   Procedure: ESOPHAGOGASTRODUODENOSCOPY (EGD) WITH PROPOFOL;  Surgeon: Marguerita Merles, Reuel Boom, MD;   3 gastric ulcers with clean base, 5 ulcers in the duodenal bulb and first portion of duodenum, 2 were noted to be oozing, treated with bipolar cautery.  Gastric biopsies consistent with H. pylori gastritis.   ESOPHAGOGASTRODUODENOSCOPY (EGD) WITH PROPOFOL N/A 05/13/2020   Procedure: ESOPHAGOGASTRODUODENOSCOPY (EGD) WITH PROPOFOL;  Surgeon: Lanelle Bal, DO;  Location: [facility] ENDOSCOPY;  Service: Endoscopy;  Laterality: N/A;   EYE SURGERY Bilateral    cataract surgery with lens implant   POLYPECTOMY  05/13/2020   Procedure: POLYPECTOMY;  Surgeon: Lanelle Bal, DO;  Location: [facility] ENDOSCOPY;  Service: Endoscopy;;   TONSILLECTOMY     TOTAL KNEE ARTHROPLASTY Right 12/28/2022   Procedure: TOTAL KNEE ARTHROPLASTY;  Surgeon: Cammy Copa, MD;  Location: [facility] OR;  Service: Orthopedics;  Laterality:  Right;   TOTAL SHOULDER ARTHROPLASTY Left 12/21/2016   Procedure: TOTAL SHOULDER ARTHROPLASTY;  Surgeon: Cammy Copa, MD;  Location: [facility] OR;  Service: Orthopedics;  Laterality: Left;   TUBAL LIGATION     umbillical hernia     Patient Active Problem List   Diagnosis Date Noted   OA (osteoarthritis) of knee 12/28/2022   S/P total knee arthroplasty, right 12/28/2022   Low vitamin D level 11/01/2022   Leg swelling 06/28/2022   Giardiasis 08/23/2021   Tapeworm infection 08/23/2021   Leg pain, left 11/17/2020   Alopecia 05/06/2020   History of Helicobacter pylori infection 04/09/2020   Anemia  04/09/2020   History of adenomatous polyp of colon 04/09/2020   Gastric ulcer 01/29/2020   Duodenal ulcer 01/29/2020   Knee pain, right 11/08/2019   Sacro ilial pain 11/08/2019   Chronic back pain 09/11/2019   Immunization refused 03/11/2019   Encounter for annual physical exam 07/10/2015   Cutaneous wart 09/30/2014   Hyperlipidemia with target LDL less than 100 05/04/2012   GERD (gastroesophageal reflux disease) 03/14/2011   Chronic right-sided low back pain with right-sided sciatica 06/25/2009   Morbid obesity (HCC) 02/17/2007   Hypothyroidism 06/14/2006   Diverticulosis of colon 03/01/2006   HIATAL HERNIA WITH REFLUX, HX OF 03/01/2006    PCP: Syliva Overman MD  REFERRING PROVIDER:   Cammy Copa, MD    REFERRING DIAG: M17.11 (ICD-10-CM) - Unilateral primary osteoarthritis, right knee   THERAPY DIAG:  Arthritis of right knee  Impaired functional mobility, balance, gait, and endurance  Rationale for Evaluation and Treatment: Rehabilitation  ONSET DATE: 12/28/22  SUBJECTIVE:   SUBJECTIVE STATEMENT: 3/10 this morning, pt noticing improved motion. Marland Kitchen  EVAL: Coming in for R TKA. 17th of this Month. Pt received home health PT. Pt reports getting around the house just fine, badges are starting to come off. Pt lives with daughter/son and grandchildren. Before the knee replacement, pt was independent with functional mobility and ADLs. Pt currently utilizing AD for ambulation, not driving currently. Assist from daugther for transfers into tub. Pt known to this clinc with prior TSA.   PERTINENT HISTORY: Lumbar fusion PAIN:  Are you having pain? Yes: NPRS scale: 5/10 Pain location: R knee Pain description: dull pain Aggravating factors: nothing Relieving factors: pain medications, ice  PRECAUTIONS: None  RED FLAGS: None   WEIGHT BEARING RESTRICTIONS: No  FALLS:  Has patient fallen in last 6 months? No  LIVING ENVIRONMENT: Lives with: lives with their  family Lives in: House/apartment Stairs: Yes: External: 4 steps; can reach both Has following equipment at home: Dan Humphreys - 2 wheeled  OCCUPATION: Retired  PLOF: Independent  PATIENT GOALS: "go get grand-baby at school"  NEXT MD VISIT: 02/07/23  OBJECTIVE:  Note: Objective measures were completed at Evaluation unless otherwise noted.  DIAGNOSTIC FINDINGS:   PATIENT SURVEYS:  FOTO 50.1%  COGNITION: Overall cognitive status: Within functional limits for tasks assessed     SENSATION: WFL  POSTURE: No Significant postural limitations  PALPATION: Not tender to Palpation  LOWER EXTREMITY ROM:  Active ROM Right eval Left eval  Hip flexion    Hip extension    Hip abduction    Hip adduction    Hip internal rotation    Hip external rotation    Knee flexion 37 110  Knee extension -5 0  Ankle dorsiflexion    Ankle plantarflexion    Ankle inversion    Ankle eversion     (Blank rows = not tested)  LOWER EXTREMITY MMT:  MMT Right eval Left eval  Hip flexion    Hip extension    Hip abduction    Hip adduction    Hip internal rotation    Hip external rotation    Knee flexion    Knee extension  4-  Ankle dorsiflexion    Ankle plantarflexion    Ankle inversion    Ankle eversion     (Blank rows = not tested)  FUNCTIONAL TESTS:  30 seconds chair stand test: 8x 2 minute walk test: 227ft w/ RW  GAIT: Distance walked: 233ft Assistive device utilized: Walker - 2 wheeled Level of assistance: Modified independence Comments: Mild R circumduction during swing, limited R knee flexion during swing. Mild antalgic gait pattern with RLE   TODAY'S TREATMENT:                                                                                                                              DATE:  01/27/2023  -Seated knee flexion active stretch 5 x 30'' hold- cues for reduced trunk lean compensation. -Heel slides with a strap x 10 x 3 with 30" hold on flexion  -Supine R SLR x 15  with tactile cues at distal foot for improved knee extesion -Seated LAQ 10 x 3  -Standing weight shifts x 10 bilaterally with symmetrical foot placement tactile cues at hips within Rw.  -Elevated sit/stands with even foot placement and no UE support 2 x 10- 6/10 pain. Cues for symmetry.   01/25/23 NuStep, seat 10 , arm 8, level 1 x 5' Heel slides with a strap x 10 x 3 with 30" hold on flexion on the last rep Supine R SLR x 10 x 2 R LAQ x 3" x 10 x 2 Heel/toe raises with UE support x 10 x 2 Standing hip abd/ext  with UE support x 10 x 2   01/20/2023 PT Evaluation, findings, HEP, prognosis, and frequency   PATIENT EDUCATION:  Education details: see below Person educated: Patient Education method: Explanation and Demonstration Education comprehension: verbalized understanding  HOME EXERCISE PROGRAM: Access Code: [email or web address] URL: [email or web address] 01/25/2023 - Supine Active Straight Leg Raise  - 1-2 x daily - 7 x weekly - 2 sets - 10 reps - Heel Toe Raises with Counter Support  - 1-2 x daily - 7 x weekly - 2 sets - 10 reps - Seated Long Arc Quad  - 1-2 x daily - 7 x weekly - 2 sets - 10 reps - 3 hold - Standing Hip Extension with Counter Support  - 1-2 x daily - 7 x weekly - 2 sets - 10 reps - Standing Hip Abduction with Counter Support  - 1-2 x daily - 7 x weekly - 2 sets - 10 reps  Date: 01/20/2023 Prepared by: Starling Manns  Exercises - Supine Heel Slide  - 1 x daily - 7 x weekly - 3 sets - 10 reps - Seated Knee Flexion Extension  AROM   - 1 x daily - 7 x weekly - 3 sets - 10 reps - Supine Gluteal Sets  - 1 x daily - 7 x weekly - 3 sets - 10 reps - Supine Knee Extension Stretch on Towel Roll  - 1 x daily - 7 x weekly - 3 sets - 10 reps  ASSESSMENT:  CLINICAL IMPRESSION: Pt tolerating session well with appropriate pain and fatigue. Introduced elevate sit/stands for symmetry and proper movement pattern as pt performs with asymmetry and need for modified  independence. Improving with ROM upon multiple trials. To date, skilled PT is required to address the impairments and improve function.   OBJECTIVE IMPAIRMENTS: Abnormal gait, decreased activity tolerance, decreased balance, decreased mobility, difficulty walking, decreased ROM, decreased strength, hypomobility, increased fascial restrictions, impaired flexibility, and pain.   ACTIVITY LIMITATIONS: carrying, lifting, bending, sitting, standing, squatting, stairs, transfers, and locomotion level  PARTICIPATION LIMITATIONS: driving, shopping, community activity, occupation, and yard work  PERSONAL FACTORS: Age are also affecting patient's functional outcome.   REHAB POTENTIAL: Good  CLINICAL DECISION MAKING: Stable/uncomplicated  EVALUATION COMPLEXITY: Low   GOALS: Goals reviewed with patient? No  SHORT TERM GOALS: Target date: 11/7/024  Pt will be independent with HEP in order to demonstrate participation in Physical Therapy POC.  Baseline: Goal status: INITIAL  2.  Pt will report 3/10 pain improvement during functional activity n order to demonstrate improved pain with functional activities.  Baseline:  Goal status: INITIAL  LONG TERM GOALS: Target date: 03/17/2023  Pt will improve 30 second chair test by > 2 reps in order to demonstrate improved functional strength to return to desired activities.  Baseline: 8x Goal status: INITIAL  2.  Pt will improve 2 MWT by >175ft with LRAD in order to demonstrate improved functional ambulatory capacity in community setting.  Baseline: 270ft with RW Goal status: INITIAL  3.  Pt will improve FOTO score by > 5 points in order to demonstrate improved pain with functional goals and outcomes. Baseline: 50.1 Goal status: INITIAL  4.  Pt will improve R knee extension ROM to 0 degrees in order to improve knee mobility during functional activities. Baseline: -5 degrees from 0 Goal status: INITIAL  5.  Pt will improve R knee flexion ROM to  >110 degrees in order to improve knee mobility during functional activities. Baseline: 45 degrees currently Goal status: INITIAL    PLAN:  PT FREQUENCY: 2x/week  PT DURATION: 8 weeks  PLANNED INTERVENTIONS: 97146- PT Re-evaluation, 97110-Therapeutic exercises, 97530- Therapeutic activity, 97112- Neuromuscular re-education, 97535- Self Care, 16109- Manual therapy, [phone]- Gait training, Patient/Family education, Balance training, Stair training, and Joint mobilization  PLAN FOR NEXT SESSION: Continue POC and may progress as tolerated with emphasis on R knee ROM and strength as well as hip strengthening. May work on balance and gait.  Nelida Meuse PT, DPT Physical Therapist with [facility] [phone] office  01/27/2023, 10:14 AM

## 2023-01-28 ENCOUNTER — Encounter: Payer: Self-pay | Admitting: Family Medicine

## 2023-01-28 ENCOUNTER — Ambulatory Visit (INDEPENDENT_AMBULATORY_CARE_PROVIDER_SITE_OTHER): Payer: Medicare HMO | Admitting: Family Medicine

## 2023-01-28 VITALS — BP 129/73 | HR 77 | Ht 61.0 in | Wt 195.0 lb

## 2023-01-28 DIAGNOSIS — E785 Hyperlipidemia, unspecified: Secondary | ICD-10-CM | POA: Diagnosis not present

## 2023-01-28 DIAGNOSIS — E038 Other specified hypothyroidism: Secondary | ICD-10-CM | POA: Diagnosis not present

## 2023-01-28 DIAGNOSIS — E559 Vitamin D deficiency, unspecified: Secondary | ICD-10-CM

## 2023-01-28 DIAGNOSIS — Z1231 Encounter for screening mammogram for malignant neoplasm of breast: Secondary | ICD-10-CM

## 2023-01-28 DIAGNOSIS — Z2821 Immunization not carried out because of patient refusal: Secondary | ICD-10-CM

## 2023-01-28 DIAGNOSIS — Z96651 Presence of right artificial knee joint: Secondary | ICD-10-CM

## 2023-01-28 DIAGNOSIS — D649 Anemia, unspecified: Secondary | ICD-10-CM | POA: Diagnosis not present

## 2023-01-28 DIAGNOSIS — R7302 Impaired glucose tolerance (oral): Secondary | ICD-10-CM

## 2023-01-28 NOTE — Patient Instructions (Signed)
Annual exam and review weight mid December, call if you need me sooner  Please schedule mammogram at checkout  CBC, chem 7 and eGFR, tSH and free t4 next 1 week   Thankful surgery has gone well  Thanks for choosing [facility], we consider it a privelige to serve you.

## 2023-01-30 NOTE — Assessment & Plan Note (Signed)
Updated lab needed at/ before next visit.   

## 2023-01-30 NOTE — Progress Notes (Signed)
Victoria Reyes     MRN: [id number]      DOB: [birth year]  Chief Complaint  Patient presents with   Follow-up    Follow up    HPI Victoria Reyes is here for follow up and re-evaluation of chronic medical conditions, medication management and review of any available recent lab and radiology data.  Preventive health is updated, specifically  Cancer screening and Immunization.  Refuses flu vaccine Has had right knee replacement in Se[ptember, in pT currently, reports significant pain The PT denies any adverse reactions to current medications since the last visit.  There are no new concerns.  There are no specific complaints   ROS Denies recent fever or chills. Denies sinus pressure, nasal congestion, ear pain or sore throat. Denies chest congestion, productive cough or wheezing. Denies chest pains, palpitations and leg swelling Denies abdominal pain, nausea, vomiting,diarrhea or constipation.   Denies dysuria, frequency, hesitancy or incontinence. . Denies headaches, seizures, numbness, or tingling. Denies depression, anxiety or insomnia. Denies skin break down or rash.   PE  BP 129/73 (BP Location: Right Arm, Patient Position: Sitting, Cuff Size: Large)   Pulse 77   Ht 5\' 1"  (1.549 m)   Wt 195 lb 0.6 oz (88.5 kg)   SpO2 98%   BMI 36.85 kg/m   Patient alert and oriented and in no cardiopulmonary distress.  HEENT: No facial asymmetry, EOMI,     Neck supple .  Chest: Clear to auscultation bilaterally.  CVS: S1, S2 no murmurs, no S3.Regular rate.  ABD: Soft non tender.   Ext: No edema MS: reduced ROM right knee, adequate in spuine , hips and left knee.  Skin: Intact, no ulcerations or rash noted.  Psych: Good eye contact, normal affect. Memory intact not anxious or depressed appearing.  CNS: CN 2-12 intact, power,  normal throughout.no focal deficits noted.   Assessment & Plan  Hypothyroidism Updated lab needed at/ before next visit.   Anemia Updated lab  needed at/ before next visit.   Morbid obesity (HCC)  Patient re-educated about  the importance of commitment to a  minimum of 150 minutes of exercise per week as able.  The importance of healthy food choices with portion control discussed, as well as eating regularly and within a 12 hour window most days. The need to choose "clean , green" food 50 to 75% of the time is discussed, as well as to make water the primary drink and set a goal of 64 ounces water daily.       01/28/2023   11:06 AM 12/28/2022    9:54 AM 12/17/2022    1:47 PM  Weight /BMI  Weight 195 lb 0.6 oz 200 lb 200 lb 14.4 oz  Height 5\' 1"  (1.549 m) 5' 1.5" (1.562 m) 5' 1.5" (1.562 m)  BMI 36.85 kg/m2 37.18 kg/m2 37.35 kg/m2      S/P total knee arthroplasty, right Currently in PT , c/o significant pain, mx by ortho  Immunization refused Offered and refuses Flu vaccine  Hyperlipidemia with target LDL less than 100 Hyperlipidemia:Low fat diet discussed and encouraged.   Lipid Panel  Lab Results  Component Value Date   CHOL 178 12/17/2021   HDL 58 12/17/2021   LDLCALC 100 (H) 12/17/2021   TRIG 110 12/17/2021   CHOLHDL 3.1 12/17/2021

## 2023-01-30 NOTE — Assessment & Plan Note (Signed)
Offered and refuses Flu vaccine

## 2023-01-30 NOTE — Assessment & Plan Note (Signed)
  Patient re-educated about  the importance of commitment to a  minimum of 150 minutes of exercise per week as able.  The importance of healthy food choices with portion control discussed, as well as eating regularly and within a 12 hour window most days. The need to choose "clean , green" food 50 to 75% of the time is discussed, as well as to make water the primary drink and set a goal of 64 ounces water daily.       01/28/2023   11:06 AM 12/28/2022    9:54 AM 12/17/2022    1:47 PM  Weight /BMI  Weight 195 lb 0.6 oz 200 lb 200 lb 14.4 oz  Height 5\' 1"  (1.549 m) 5' 1.5" (1.562 m) 5' 1.5" (1.562 m)  BMI 36.85 kg/m2 37.18 kg/m2 37.35 kg/m2

## 2023-01-30 NOTE — Assessment & Plan Note (Signed)
Currently in PT , c/o significant pain, mx by ortho

## 2023-01-30 NOTE — Assessment & Plan Note (Signed)
Hyperlipidemia:Low fat diet discussed and encouraged.   Lipid Panel  Lab Results  Component Value Date   CHOL 178 12/17/2021   HDL 58 12/17/2021   LDLCALC 100 (H) 12/17/2021   TRIG 110 12/17/2021   CHOLHDL 3.1 12/17/2021

## 2023-02-01 ENCOUNTER — Ambulatory Visit (HOSPITAL_COMMUNITY): Payer: Medicare HMO

## 2023-02-01 DIAGNOSIS — M1711 Unilateral primary osteoarthritis, right knee: Secondary | ICD-10-CM | POA: Diagnosis not present

## 2023-02-01 DIAGNOSIS — M25661 Stiffness of right knee, not elsewhere classified: Secondary | ICD-10-CM | POA: Diagnosis not present

## 2023-02-01 DIAGNOSIS — Z7409 Other reduced mobility: Secondary | ICD-10-CM | POA: Diagnosis not present

## 2023-02-01 NOTE — Therapy (Signed)
OUTPATIENT PHYSICAL THERAPY LOWER EXTREMITY TREATMENT   Patient Name: Victoria Reyes MRN: [id number] DOB:11-01-1950, 72 y.o., female Today's Date: 02/01/2023  END OF SESSION:  PT End of Session - 02/01/23 0936     Visit Number 4    Date for PT Re-Evaluation 03/17/23    Authorization Type [employer] medicare (12 visits approved)    Authorization Time Period 01/20/23-03/17/23    Authorization - Visit Number 3    Authorization - Number of Visits 12    Progress Note Due on Visit 10    PT Start Time 0936    PT Stop Time 1015    PT Time Calculation (min) 39 min    Activity Tolerance Patient tolerated treatment well    Behavior During Therapy [facility] for tasks assessed/performed             Past Medical History:  Diagnosis Date   Anemia    as a  young woman   Diverticulitis 2005   CT    DJD (degenerative joint disease)    GERD (gastroesophageal reflux disease)    H. pylori infection 01/2020   S/p treatment with omeprazole, amoxicillin, and Biaxin.   Headache(784.0)    Hyperlipidemia    Hypothyroidism    Knee pain, right    Low back pain    Neuropathy    peroneal nerve   Neuropathy    left lower leg from back surgery per patient   Obesity    Pneumonia    as child   S/P colonoscopy 2005   Dr. Katrinka Blazing: sigmoid diverticulosis   Thyroid disease    Tobacco abuse    Past Surgical History:  Procedure Laterality Date   BACK SURGERY     BIOPSY  01/29/2020   Procedure: BIOPSY;  Surgeon: Dolores Frame, MD;  Location: [facility];  Service: Gastroenterology;;   BIOPSY  05/13/2020   Procedure: BIOPSY;  Surgeon: Lanelle Bal, DO;  Location: [facility] ENDOSCOPY;  Service: Endoscopy;;   COLONOSCOPY  12/18/2010   Procedure: COLONOSCOPY;  Surgeon: Arlyce Harman, MD; 3 mm sessile polyp at hepatic flexure, pancolonic diverticulosis, internal hemorrhoids, anal lesion just above the dentate line biopsied via cold forceps.  Colonic polyp was a tubular adenoma.  Anus biopsy with mild  active inflammation associated with reactive epithelial changes and hemorrhage.  Recommended repeat colonoscopy in 2022.   COLONOSCOPY WITH PROPOFOL N/A 05/13/2020   Procedure: COLONOSCOPY WITH PROPOFOL;  Surgeon: Lanelle Bal, DO;  Location: [facility] ENDOSCOPY;  Service: Endoscopy;  Laterality: N/A;  12:30PM   cyst removal right wrist     ESOPHAGOGASTRODUODENOSCOPY (EGD) WITH PROPOFOL N/A 01/29/2020   Procedure: ESOPHAGOGASTRODUODENOSCOPY (EGD) WITH PROPOFOL;  Surgeon: Marguerita Merles, Reuel Boom, MD;   3 gastric ulcers with clean base, 5 ulcers in the duodenal bulb and first portion of duodenum, 2 were noted to be oozing, treated with bipolar cautery.  Gastric biopsies consistent with H. pylori gastritis.   ESOPHAGOGASTRODUODENOSCOPY (EGD) WITH PROPOFOL N/A 05/13/2020   Procedure: ESOPHAGOGASTRODUODENOSCOPY (EGD) WITH PROPOFOL;  Surgeon: Lanelle Bal, DO;  Location: [facility] ENDOSCOPY;  Service: Endoscopy;  Laterality: N/A;   EYE SURGERY Bilateral    cataract surgery with lens implant   POLYPECTOMY  05/13/2020   Procedure: POLYPECTOMY;  Surgeon: Lanelle Bal, DO;  Location: [facility] ENDOSCOPY;  Service: Endoscopy;;   TONSILLECTOMY     TOTAL KNEE ARTHROPLASTY Right 12/28/2022   Procedure: TOTAL KNEE ARTHROPLASTY;  Surgeon: Cammy Copa, MD;  Location: [facility] OR;  Service: Orthopedics;  Laterality:  Right;   TOTAL SHOULDER ARTHROPLASTY Left 12/21/2016   Procedure: TOTAL SHOULDER ARTHROPLASTY;  Surgeon: Cammy Copa, MD;  Location: [facility] OR;  Service: Orthopedics;  Laterality: Left;   TUBAL LIGATION     umbillical hernia     Patient Active Problem List   Diagnosis Date Noted   OA (osteoarthritis) of knee 12/28/2022   S/P total knee arthroplasty, right 12/28/2022   Low vitamin D level 11/01/2022   Leg swelling 06/28/2022   Giardiasis 08/23/2021   Tapeworm infection 08/23/2021   Leg pain, left 11/17/2020   Alopecia 05/06/2020   History of Helicobacter pylori infection 04/09/2020   Anemia  04/09/2020   History of adenomatous polyp of colon 04/09/2020   Gastric ulcer 01/29/2020   Duodenal ulcer 01/29/2020   Sacro ilial pain 11/08/2019   Chronic back pain 09/11/2019   Immunization refused 03/11/2019   Encounter for annual physical exam 07/10/2015   Cutaneous wart 09/30/2014   Hyperlipidemia with target LDL less than 100 05/04/2012   GERD (gastroesophageal reflux disease) 03/14/2011   Chronic right-sided low back pain with right-sided sciatica 06/25/2009   Morbid obesity (HCC) 02/17/2007   Hypothyroidism 06/14/2006   Diverticulosis of colon 03/01/2006   HIATAL HERNIA WITH REFLUX, HX OF 03/01/2006    PCP: Syliva Overman MD  REFERRING PROVIDER:   Cammy Copa, MD    REFERRING DIAG: M17.11 (ICD-10-CM) - Unilateral primary osteoarthritis, right knee   THERAPY DIAG:  Arthritis of right knee  Impaired functional mobility, balance, gait, and endurance  Decreased range of motion (ROM) of right knee  Rationale for Evaluation and Treatment: Rehabilitation  ONSET DATE: 12/28/22  SUBJECTIVE:   SUBJECTIVE STATEMENT: "Getting better"; 4/10 pain   EVAL: Coming in for R TKA. 17th of this Month. Pt received home health PT. Pt reports getting around the house just fine, badges are starting to come off. Pt lives with daughter/son and grandchildren. Before the knee replacement, pt was independent with functional mobility and ADLs. Pt currently utilizing AD for ambulation, not driving currently. Assist from daugther for transfers into tub. Pt known to this clinc with prior TSA.   PERTINENT HISTORY: Lumbar fusion PAIN:  Are you having pain? Yes: NPRS scale: 5/10 Pain location: R knee Pain description: dull pain Aggravating factors: nothing Relieving factors: pain medications, ice  PRECAUTIONS: None  RED FLAGS: None   WEIGHT BEARING RESTRICTIONS: No  FALLS:  Has patient fallen in last 6 months? No  LIVING ENVIRONMENT: Lives with: lives with their  family Lives in: House/apartment Stairs: Yes: External: 4 steps; can reach both Has following equipment at home: Dan Humphreys - 2 wheeled  OCCUPATION: Retired  PLOF: Independent  PATIENT GOALS: "go get grand-baby at school"  NEXT MD VISIT: 02/07/23  OBJECTIVE:  Note: Objective measures were completed at Evaluation unless otherwise noted.  DIAGNOSTIC FINDINGS:   PATIENT SURVEYS:  FOTO 50.1%  COGNITION: Overall cognitive status: Within functional limits for tasks assessed     SENSATION: WFL  POSTURE: No Significant postural limitations  PALPATION: Not tender to Palpation  LOWER EXTREMITY ROM:  Active ROM Right eval Left eval Right 02/01/23  Hip flexion     Hip extension     Hip abduction     Hip adduction     Hip internal rotation     Hip external rotation     Knee flexion 37 110 60, 63 AAROM  Knee extension -5 0   Ankle dorsiflexion     Ankle plantarflexion     Ankle inversion  Ankle eversion      (Blank rows = not tested)  LOWER EXTREMITY MMT:  MMT Right eval Left eval  Hip flexion    Hip extension    Hip abduction    Hip adduction    Hip internal rotation    Hip external rotation    Knee flexion    Knee extension  4-  Ankle dorsiflexion    Ankle plantarflexion    Ankle inversion    Ankle eversion     (Blank rows = not tested)  FUNCTIONAL TESTS:  30 seconds chair stand test: 8x 2 minute walk test: 233ft w/ RW  GAIT: Distance walked: 245ft Assistive device utilized: Walker - 2 wheeled Level of assistance: Modified independence Comments: Mild R circumduction during swing, limited R knee flexion during swing. Mild antalgic gait pattern with RLE   TODAY'S TREATMENT:                                                                                                                              DATE:  02/01/23 Seated: Leg dangle for flexion Calf/hamstring stretch with strap 2 x 30" Supine: STM to right knee to increase soft tissue mobility  and joint ROM x 10' AROM right knee flexion 60 degrees; 63 AAROM SAQ x 10 SAQ into SLR  x 10  Standing: Knee drives for flexion x 2' on 7" step Heel toe raises x 10   01/27/2023  -Seated knee flexion active stretch 5 x 30'' hold- cues for reduced trunk lean compensation. -Heel slides with a strap x 10 x 3 with 30" hold on flexion  -Supine R SLR x 15 with tactile cues at distal foot for improved knee extesion -Seated LAQ 10 x 3  -Standing weight shifts x 10 bilaterally with symmetrical foot placement tactile cues at hips within Rw.  -Elevated sit/stands with even foot placement and no UE support 2 x 10- 6/10 pain. Cues for symmetry.   01/25/23 NuStep, seat 10 , arm 8, level 1 x 5' Heel slides with a strap x 10 x 3 with 30" hold on flexion on the last rep Supine R SLR x 10 x 2 R LAQ x 3" x 10 x 2 Heel/toe raises with UE support x 10 x 2 Standing hip abd/ext  with UE support x 10 x 2   01/20/2023 PT Evaluation, findings, HEP, prognosis, and frequency   PATIENT EDUCATION:  Education details: see below Person educated: Patient Education method: Explanation and Demonstration Education comprehension: verbalized understanding  HOME EXERCISE PROGRAM: Access Code: [email or web address] URL: [email or web address] 01/25/2023 - Supine Active Straight Leg Raise  - 1-2 x daily - 7 x weekly - 2 sets - 10 reps - Heel Toe Raises with Counter Support  - 1-2 x daily - 7 x weekly - 2 sets - 10 reps - Seated Long Arc Quad  - 1-2 x daily - 7 x weekly - 2 sets - 10 reps - 3 hold -  Standing Hip Extension with Counter Support  - 1-2 x daily - 7 x weekly - 2 sets - 10 reps - Standing Hip Abduction with Counter Support  - 1-2 x daily - 7 x weekly - 2 sets - 10 reps  Date: 01/20/2023 Prepared by: Starling Manns  Exercises - Supine Heel Slide  - 1 x daily - 7 x weekly - 3 sets - 10 reps - Seated Knee Flexion Extension AROM   - 1 x daily - 7 x weekly - 3 sets - 10 reps - Supine Gluteal Sets  -  1 x daily - 7 x weekly - 3 sets - 10 reps - Supine Knee Extension Stretch on Towel Roll  - 1 x daily - 7 x weekly - 3 sets - 10 reps  ASSESSMENT:  CLINICAL IMPRESSION: Started session with soft tissue work and manual ROM to improve right knee mobility; good improvement with knee flexion noted from evaluation. Needs some cues with form for SLR.  Patient ambulates short distance in PT gym without her RW; needs cues to increase heel strike and toe off.   To date, skilled PT is required to address the impairments and improve function.   OBJECTIVE IMPAIRMENTS: Abnormal gait, decreased activity tolerance, decreased balance, decreased mobility, difficulty walking, decreased ROM, decreased strength, hypomobility, increased fascial restrictions, impaired flexibility, and pain.   ACTIVITY LIMITATIONS: carrying, lifting, bending, sitting, standing, squatting, stairs, transfers, and locomotion level  PARTICIPATION LIMITATIONS: driving, shopping, community activity, occupation, and yard work  PERSONAL FACTORS: Age are also affecting patient's functional outcome.   REHAB POTENTIAL: Good  CLINICAL DECISION MAKING: Stable/uncomplicated  EVALUATION COMPLEXITY: Low   GOALS: Goals reviewed with patient? No  SHORT TERM GOALS: Target date: 11/7/024  Pt will be independent with HEP in order to demonstrate participation in Physical Therapy POC.  Baseline: Goal status: INITIAL  2.  Pt will report 3/10 pain improvement during functional activity n order to demonstrate improved pain with functional activities.  Baseline:  Goal status: INITIAL  LONG TERM GOALS: Target date: 03/17/2023  Pt will improve 30 second chair test by > 2 reps in order to demonstrate improved functional strength to return to desired activities.  Baseline: 8x Goal status: INITIAL  2.  Pt will improve 2 MWT by >134ft with LRAD in order to demonstrate improved functional ambulatory capacity in community setting.  Baseline: 279ft  with RW Goal status: INITIAL  3.  Pt will improve FOTO score by > 5 points in order to demonstrate improved pain with functional goals and outcomes. Baseline: 50.1 Goal status: INITIAL  4.  Pt will improve R knee extension ROM to 0 degrees in order to improve knee mobility during functional activities. Baseline: -5 degrees from 0 Goal status: INITIAL  5.  Pt will improve R knee flexion ROM to >110 degrees in order to improve knee mobility during functional activities. Baseline: 45 degrees currently Goal status: INITIAL    PLAN:  PT FREQUENCY: 2x/week  PT DURATION: 8 weeks  PLANNED INTERVENTIONS: 97146- PT Re-evaluation, 97110-Therapeutic exercises, 97530- Therapeutic activity, 97112- Neuromuscular re-education, 97535- Self Care, [id number]- Manual therapy, [phone]- Gait training, Patient/Family education, Balance training, Stair training, and Joint mobilization  PLAN FOR NEXT SESSION: Continue POC and may progress as tolerated with emphasis on R knee ROM and strength as well as hip strengthening. May work on balance and gait. Practice walking with cane  10:15 AM, 02/01/23 Wymon Swaney Small Soledad Budreau MPT [facility] [phone]

## 2023-02-03 ENCOUNTER — Encounter (HOSPITAL_COMMUNITY): Payer: Self-pay

## 2023-02-03 ENCOUNTER — Ambulatory Visit (HOSPITAL_COMMUNITY): Payer: Medicare HMO

## 2023-02-03 DIAGNOSIS — M25661 Stiffness of right knee, not elsewhere classified: Secondary | ICD-10-CM

## 2023-02-03 DIAGNOSIS — M1711 Unilateral primary osteoarthritis, right knee: Secondary | ICD-10-CM | POA: Diagnosis not present

## 2023-02-03 DIAGNOSIS — Z7409 Other reduced mobility: Secondary | ICD-10-CM

## 2023-02-03 DIAGNOSIS — E038 Other specified hypothyroidism: Secondary | ICD-10-CM | POA: Diagnosis not present

## 2023-02-03 DIAGNOSIS — R7302 Impaired glucose tolerance (oral): Secondary | ICD-10-CM | POA: Diagnosis not present

## 2023-02-03 DIAGNOSIS — E785 Hyperlipidemia, unspecified: Secondary | ICD-10-CM | POA: Diagnosis not present

## 2023-02-03 NOTE — Therapy (Signed)
OUTPATIENT PHYSICAL THERAPY LOWER EXTREMITY TREATMENT   Patient Name: Victoria Reyes MRN: [id number] DOB:1951/03/06, 72 y.o., female Today's Date: 02/03/2023  END OF SESSION:  PT End of Session - 02/03/23 0942     Visit Number 5    Date for PT Re-Evaluation 03/17/23    Authorization Type [employer] medicare (12 visits approved)    Authorization Time Period 01/20/23-03/17/23    Authorization - Visit Number 4    Authorization - Number of Visits 12    Progress Note Due on Visit 10    PT Start Time [phone]    PT Stop Time 1032    PT Time Calculation (min) 45 min             Past Medical History:  Diagnosis Date   Anemia    as a  young woman   Diverticulitis 2005   CT    DJD (degenerative joint disease)    GERD (gastroesophageal reflux disease)    H. pylori infection 01/2020   S/p treatment with omeprazole, amoxicillin, and Biaxin.   Headache(784.0)    Hyperlipidemia    Hypothyroidism    Knee pain, right    Low back pain    Neuropathy    peroneal nerve   Neuropathy    left lower leg from back surgery per patient   Obesity    Pneumonia    as child   S/P colonoscopy 2005   Dr. Katrinka Blazing: sigmoid diverticulosis   Thyroid disease    Tobacco abuse    Past Surgical History:  Procedure Laterality Date   BACK SURGERY     BIOPSY  01/29/2020   Procedure: BIOPSY;  Surgeon: Dolores Frame, MD;  Location: [facility];  Service: Gastroenterology;;   BIOPSY  05/13/2020   Procedure: BIOPSY;  Surgeon: Lanelle Bal, DO;  Location: [facility] ENDOSCOPY;  Service: Endoscopy;;   COLONOSCOPY  12/18/2010   Procedure: COLONOSCOPY;  Surgeon: Arlyce Harman, MD; 3 mm sessile polyp at hepatic flexure, pancolonic diverticulosis, internal hemorrhoids, anal lesion just above the dentate line biopsied via cold forceps.  Colonic polyp was a tubular adenoma.  Anus biopsy with mild active inflammation associated with reactive epithelial changes and hemorrhage.  Recommended repeat colonoscopy in  2022.   COLONOSCOPY WITH PROPOFOL N/A 05/13/2020   Procedure: COLONOSCOPY WITH PROPOFOL;  Surgeon: Lanelle Bal, DO;  Location: [facility] ENDOSCOPY;  Service: Endoscopy;  Laterality: N/A;  12:30PM   cyst removal right wrist     ESOPHAGOGASTRODUODENOSCOPY (EGD) WITH PROPOFOL N/A 01/29/2020   Procedure: ESOPHAGOGASTRODUODENOSCOPY (EGD) WITH PROPOFOL;  Surgeon: Marguerita Merles, Reuel Boom, MD;   3 gastric ulcers with clean base, 5 ulcers in the duodenal bulb and first portion of duodenum, 2 were noted to be oozing, treated with bipolar cautery.  Gastric biopsies consistent with H. pylori gastritis.   ESOPHAGOGASTRODUODENOSCOPY (EGD) WITH PROPOFOL N/A 05/13/2020   Procedure: ESOPHAGOGASTRODUODENOSCOPY (EGD) WITH PROPOFOL;  Surgeon: Lanelle Bal, DO;  Location: [facility] ENDOSCOPY;  Service: Endoscopy;  Laterality: N/A;   EYE SURGERY Bilateral    cataract surgery with lens implant   POLYPECTOMY  05/13/2020   Procedure: POLYPECTOMY;  Surgeon: Lanelle Bal, DO;  Location: [facility] ENDOSCOPY;  Service: Endoscopy;;   TONSILLECTOMY     TOTAL KNEE ARTHROPLASTY Right 12/28/2022   Procedure: TOTAL KNEE ARTHROPLASTY;  Surgeon: Cammy Copa, MD;  Location: [facility] OR;  Service: Orthopedics;  Laterality: Right;   TOTAL SHOULDER ARTHROPLASTY Left 12/21/2016   Procedure: TOTAL SHOULDER ARTHROPLASTY;  Surgeon: Cammy Copa,  MD;  Location: [facility] OR;  Service: Orthopedics;  Laterality: Left;   TUBAL LIGATION     umbillical hernia     Patient Active Problem List   Diagnosis Date Noted   OA (osteoarthritis) of knee 12/28/2022   S/P total knee arthroplasty, right 12/28/2022   Low vitamin D level 11/01/2022   Leg swelling 06/28/2022   Giardiasis 08/23/2021   Tapeworm infection 08/23/2021   Leg pain, left 11/17/2020   Alopecia 05/06/2020   History of Helicobacter pylori infection 04/09/2020   Anemia 04/09/2020   History of adenomatous polyp of colon 04/09/2020   Gastric ulcer 01/29/2020   Duodenal ulcer 01/29/2020    Sacro ilial pain 11/08/2019   Chronic back pain 09/11/2019   Immunization refused 03/11/2019   Encounter for annual physical exam 07/10/2015   Cutaneous wart 09/30/2014   Hyperlipidemia with target LDL less than 100 05/04/2012   GERD (gastroesophageal reflux disease) 03/14/2011   Chronic right-sided low back pain with right-sided sciatica 06/25/2009   Morbid obesity (HCC) 02/17/2007   Hypothyroidism 06/14/2006   Diverticulosis of colon 03/01/2006   HIATAL HERNIA WITH REFLUX, HX OF 03/01/2006    PCP: Syliva Overman MD  REFERRING PROVIDER:   Cammy Copa, MD    REFERRING DIAG: M17.11 (ICD-10-CM) - Unilateral primary osteoarthritis, right knee   THERAPY DIAG:  Arthritis of right knee  Impaired functional mobility, balance, gait, and endurance  Decreased range of motion (ROM) of right knee  Rationale for Evaluation and Treatment: Rehabilitation  ONSET DATE: 12/28/22  SUBJECTIVE:   SUBJECTIVE STATEMENT: No pain at entrance, arrived with [facility].     EVAL: Coming in for R TKA. 17th of this Month. Pt received home health PT. Pt reports getting around the house just fine, badges are starting to come off. Pt lives with daughter/son and grandchildren. Before the knee replacement, pt was independent with functional mobility and ADLs. Pt currently utilizing AD for ambulation, not driving currently. Assist from daugther for transfers into tub. Pt known to this clinc with prior TSA.   PERTINENT HISTORY: Lumbar fusion PAIN:  Are you having pain? Yes: NPRS scale: 5/10 Pain location: R knee Pain description: dull pain Aggravating factors: nothing Relieving factors: pain medications, ice  PRECAUTIONS: None  RED FLAGS: None   WEIGHT BEARING RESTRICTIONS: No  FALLS:  Has patient fallen in last 6 months? No  LIVING ENVIRONMENT: Lives with: lives with their family Lives in: House/apartment Stairs: Yes: External: 4 steps; can reach both Has following equipment at home:  Dan Humphreys - 2 wheeled  OCCUPATION: Retired  PLOF: Independent  PATIENT GOALS: "go get grand-baby at school"  NEXT MD VISIT: 02/07/23  OBJECTIVE:  Note: Objective measures were completed at Evaluation unless otherwise noted.  DIAGNOSTIC FINDINGS:   PATIENT SURVEYS:  FOTO 50.1%  COGNITION: Overall cognitive status: Within functional limits for tasks assessed     SENSATION: WFL  POSTURE: No Significant postural limitations  PALPATION: Not tender to Palpation  LOWER EXTREMITY ROM:  Active ROM Right eval Left eval Right 02/01/23  Hip flexion     Hip extension     Hip abduction     Hip adduction     Hip internal rotation     Hip external rotation     Knee flexion 37 110 60, 63 AAROM  Knee extension -5 0   Ankle dorsiflexion     Ankle plantarflexion     Ankle inversion     Ankle eversion      (Blank rows =  not tested)  LOWER EXTREMITY MMT:  MMT Right eval Left eval  Hip flexion    Hip extension    Hip abduction    Hip adduction    Hip internal rotation    Hip external rotation    Knee flexion    Knee extension  4-  Ankle dorsiflexion    Ankle plantarflexion    Ankle inversion    Ankle eversion     (Blank rows = not tested)  FUNCTIONAL TESTS:  30 seconds chair stand test: 8x 2 minute walk test: 238ft w/ RW  GAIT: Distance walked: 253ft Assistive device utilized: Walker - 2 wheeled Level of assistance: Modified independence Comments: Mild R circumduction during swing, limited R knee flexion during swing. Mild antalgic gait pattern with RLE   TODAY'S TREATMENT:                                                                                                                              DATE:  02/03/23: Gait training with SPC, cueing for UE and sequence with initial 3 point step to to 2 point step thru 2RT inside // bars for heel strike, swing through, toe push off, knee flexion  Manual:  Supine with LE elevated  Retrograde massage  Patella mobs  all directions Supine:  Quad sets 10x 5"  TKE 15 x 5" on half bolster  SLR with quad set prior raise 10x  Heel slide 10x 5-10" holds at end range   AROM 4- 68 degrees Seated: AAROM heel slide 10x 10" Standing:  Knee drive for flexion on 8in step height 5x 20" Bike seat 10 rocking x 5'  02/01/23 Seated: Leg dangle for flexion Calf/hamstring stretch with strap 2 x 30" Supine: STM to right knee to increase soft tissue mobility and joint ROM x 10' AROM right knee flexion 60 degrees; 63 AAROM SAQ x 10 SAQ into SLR  x 10  Standing: Knee drives for flexion x 2' on 7" step Heel toe raises x 10   01/27/2023  -Seated knee flexion active stretch 5 x 30'' hold- cues for reduced trunk lean compensation. -Heel slides with a strap x 10 x 3 with 30" hold on flexion  -Supine R SLR x 15 with tactile cues at distal foot for improved knee extesion -Seated LAQ 10 x 3  -Standing weight shifts x 10 bilaterally with symmetrical foot placement tactile cues at hips within Rw.  -Elevated sit/stands with even foot placement and no UE support 2 x 10- 6/10 pain. Cues for symmetry.   01/25/23 NuStep, seat 10 , arm 8, level 1 x 5' Heel slides with a strap x 10 x 3 with 30" hold on flexion on the last rep Supine R SLR x 10 x 2 R LAQ x 3" x 10 x 2 Heel/toe raises with UE support x 10 x 2 Standing hip abd/ext  with UE support x 10 x 2   01/20/2023 PT Evaluation, findings,  HEP, prognosis, and frequency   PATIENT EDUCATION:  Education details: see below Person educated: Patient Education method: Medical illustrator Education comprehension: verbalized understanding  HOME EXERCISE PROGRAM: Access Code: [email or web address] URL: [email or web address] 01/25/2023 - Supine Active Straight Leg Raise  - 1-2 x daily - 7 x weekly - 2 sets - 10 reps - Heel Toe Raises with Counter Support  - 1-2 x daily - 7 x weekly - 2 sets - 10 reps - Seated Long Arc Quad  - 1-2 x daily - 7 x weekly - 2 sets -  10 reps - 3 hold - Standing Hip Extension with Counter Support  - 1-2 x daily - 7 x weekly - 2 sets - 10 reps - Standing Hip Abduction with Counter Support  - 1-2 x daily - 7 x weekly - 2 sets - 10 reps  Date: 01/20/2023 Prepared by: Starling Manns  Exercises - Supine Heel Slide  - 1 x daily - 7 x weekly - 3 sets - 10 reps - Seated Knee Flexion Extension AROM   - 1 x daily - 7 x weekly - 3 sets - 10 reps - Supine Gluteal Sets  - 1 x daily - 7 x weekly - 3 sets - 10 reps - Supine Knee Extension Stretch on Towel Roll  - 1 x daily - 7 x weekly - 3 sets - 10 reps  ASSESSMENT:  CLINICAL IMPRESSION: Pt arrived with her SPC, gait training with cueing for proper hand use and sequence as well as heel to toe mechannics.  Session focus with ROM and proximal strengthening.  Manual complete for edema control, patella mobs and STM to quads prior ROM based exercises.  Added AAROM and encouraged pt to increased hold time for prolonged stretches.  AROM 4-68 degrees.  EOS on bike rocking for mobility.     OBJECTIVE IMPAIRMENTS: Abnormal gait, decreased activity tolerance, decreased balance, decreased mobility, difficulty walking, decreased ROM, decreased strength, hypomobility, increased fascial restrictions, impaired flexibility, and pain.   ACTIVITY LIMITATIONS: carrying, lifting, bending, sitting, standing, squatting, stairs, transfers, and locomotion level  PARTICIPATION LIMITATIONS: driving, shopping, community activity, occupation, and yard work  PERSONAL FACTORS: Age are also affecting patient's functional outcome.   REHAB POTENTIAL: Good  CLINICAL DECISION MAKING: Stable/uncomplicated  EVALUATION COMPLEXITY: Low   GOALS: Goals reviewed with patient? No  SHORT TERM GOALS: Target date: 11/7/024  Pt will be independent with HEP in order to demonstrate participation in Physical Therapy POC.  Baseline: Goal status: INITIAL  2.  Pt will report 3/10 pain improvement during functional  activity n order to demonstrate improved pain with functional activities.  Baseline:  Goal status: INITIAL  LONG TERM GOALS: Target date: 03/17/2023  Pt will improve 30 second chair test by > 2 reps in order to demonstrate improved functional strength to return to desired activities.  Baseline: 8x Goal status: INITIAL  2.  Pt will improve 2 MWT by >137ft with LRAD in order to demonstrate improved functional ambulatory capacity in community setting.  Baseline: 245ft with RW Goal status: INITIAL  3.  Pt will improve FOTO score by > 5 points in order to demonstrate improved pain with functional goals and outcomes. Baseline: 50.1 Goal status: INITIAL  4.  Pt will improve R knee extension ROM to 0 degrees in order to improve knee mobility during functional activities. Baseline: -5 degrees from 0 Goal status: INITIAL  5.  Pt will improve R knee flexion ROM to >110 degrees in order to improve knee  mobility during functional activities. Baseline: 45 degrees currently Goal status: INITIAL    PLAN:  PT FREQUENCY: 2x/week  PT DURATION: 8 weeks  PLANNED INTERVENTIONS: 97146- PT Re-evaluation, 97110-Therapeutic exercises, 97530- Therapeutic activity, 97112- Neuromuscular re-education, 97535- Self Care, 62130- Manual therapy, [phone]- Gait training, Patient/Family education, Balance training, Stair training, and Joint mobilization  PLAN FOR NEXT SESSION: Continue POC and may progress as tolerated with emphasis on R knee ROM and strength as well as hip strengthening. May work on balance and gait. Practice walking with cane Becky Sax, LPTA/CLT; CBIS [phone]  Juel Burrow, PTA 02/03/2023, 1:00 PM  1:00 PM, 02/03/23

## 2023-02-04 LAB — BMP8+EGFR
BUN/Creatinine Ratio: 17 (ref 12–28)
BUN: 13 mg/dL (ref 8–27)
CO2: 23 mmol/L (ref 20–29)
Calcium: 9.3 mg/dL (ref 8.7–10.3)
Chloride: 104 mmol/L (ref 96–106)
Creatinine, Ser: 0.78 mg/dL (ref 0.57–1.00)
Glucose: 81 mg/dL (ref 70–99)
Potassium: 4.1 mmol/L (ref 3.5–5.2)
Sodium: 142 mmol/L (ref 134–144)
eGFR: 80 mL/min/{1.73_m2} (ref >59)

## 2023-02-04 LAB — CBC
Hematocrit: 37.8 % (ref 34.0–46.6)
Hemoglobin: 11.6 g/dL (ref 11.1–15.9)
MCH: 28 pg (ref 26.6–33.0)
MCHC: 30.7 g/dL — ABNORMAL LOW (ref 31.5–35.7)
MCV: 91 fL (ref 79–97)
Platelets: 269 10*3/uL (ref 150–450)
RBC: 4.14 x10E6/uL (ref 3.77–5.28)
RDW: 13.4 % (ref 11.7–15.4)
WBC: 7.7 10*3/uL (ref 3.4–10.8)

## 2023-02-04 LAB — TSH: TSH: 5.15 u[IU]/mL — ABNORMAL HIGH (ref 0.450–4.500)

## 2023-02-04 LAB — T4, FREE: Free T4: 1.58 ng/dL (ref 0.82–1.77)

## 2023-02-08 ENCOUNTER — Encounter (HOSPITAL_COMMUNITY): Payer: Self-pay

## 2023-02-08 ENCOUNTER — Ambulatory Visit (HOSPITAL_COMMUNITY): Payer: Medicare HMO

## 2023-02-08 DIAGNOSIS — M25661 Stiffness of right knee, not elsewhere classified: Secondary | ICD-10-CM

## 2023-02-08 DIAGNOSIS — Z7409 Other reduced mobility: Secondary | ICD-10-CM

## 2023-02-08 DIAGNOSIS — M1711 Unilateral primary osteoarthritis, right knee: Secondary | ICD-10-CM

## 2023-02-08 NOTE — Therapy (Addendum)
OUTPATIENT PHYSICAL THERAPY LOWER EXTREMITY TREATMENT   Patient Name: Victoria Reyes MRN: [id number] DOB:[birth year], 72 y.o., female Today's Date: 02/08/2023  END OF SESSION:  PT End of Session - 02/08/23 0935     Visit Number 6    Number of Visits 12    Date for PT Re-Evaluation 03/17/23    Authorization Type [employer] medicare (12 visits approved)    Authorization Time Period 01/20/23-03/17/23    Authorization - Number of Visits 12    Progress Note Due on Visit 10    PT Start Time 0935    PT Stop Time 1015    PT Time Calculation (min) 40 min             Past Medical History:  Diagnosis Date   Anemia    as a  young woman   Diverticulitis 2005   CT    DJD (degenerative joint disease)    GERD (gastroesophageal reflux disease)    H. pylori infection 01/2020   S/p treatment with omeprazole, amoxicillin, and Biaxin.   Headache(784.0)    Hyperlipidemia    Hypothyroidism    Knee pain, right    Low back pain    Neuropathy    peroneal nerve   Neuropathy    left lower leg from back surgery per patient   Obesity    Pneumonia    as child   S/P colonoscopy 2005   Dr. Katrinka Blazing: sigmoid diverticulosis   Thyroid disease    Tobacco abuse    Past Surgical History:  Procedure Laterality Date   BACK SURGERY     BIOPSY  01/29/2020   Procedure: BIOPSY;  Surgeon: Dolores Frame, MD;  Location: [facility];  Service: Gastroenterology;;   BIOPSY  05/13/2020   Procedure: BIOPSY;  Surgeon: Lanelle Bal, DO;  Location: [facility] ENDOSCOPY;  Service: Endoscopy;;   COLONOSCOPY  12/18/2010   Procedure: COLONOSCOPY;  Surgeon: Arlyce Harman, MD; 3 mm sessile polyp at hepatic flexure, pancolonic diverticulosis, internal hemorrhoids, anal lesion just above the dentate line biopsied via cold forceps.  Colonic polyp was a tubular adenoma.  Anus biopsy with mild active inflammation associated with reactive epithelial changes and hemorrhage.  Recommended repeat colonoscopy in 2022.    COLONOSCOPY WITH PROPOFOL N/A 05/13/2020   Procedure: COLONOSCOPY WITH PROPOFOL;  Surgeon: Lanelle Bal, DO;  Location: [facility] ENDOSCOPY;  Service: Endoscopy;  Laterality: N/A;  12:30PM   cyst removal right wrist     ESOPHAGOGASTRODUODENOSCOPY (EGD) WITH PROPOFOL N/A 01/29/2020   Procedure: ESOPHAGOGASTRODUODENOSCOPY (EGD) WITH PROPOFOL;  Surgeon: Marguerita Merles, Reuel Boom, MD;   3 gastric ulcers with clean base, 5 ulcers in the duodenal bulb and first portion of duodenum, 2 were noted to be oozing, treated with bipolar cautery.  Gastric biopsies consistent with H. pylori gastritis.   ESOPHAGOGASTRODUODENOSCOPY (EGD) WITH PROPOFOL N/A 05/13/2020   Procedure: ESOPHAGOGASTRODUODENOSCOPY (EGD) WITH PROPOFOL;  Surgeon: Lanelle Bal, DO;  Location: [facility] ENDOSCOPY;  Service: Endoscopy;  Laterality: N/A;   EYE SURGERY Bilateral    cataract surgery with lens implant   POLYPECTOMY  05/13/2020   Procedure: POLYPECTOMY;  Surgeon: Lanelle Bal, DO;  Location: [facility] ENDOSCOPY;  Service: Endoscopy;;   TONSILLECTOMY     TOTAL KNEE ARTHROPLASTY Right 12/28/2022   Procedure: TOTAL KNEE ARTHROPLASTY;  Surgeon: Cammy Copa, MD;  Location: [facility] OR;  Service: Orthopedics;  Laterality: Right;   TOTAL SHOULDER ARTHROPLASTY Left 12/21/2016   Procedure: TOTAL SHOULDER ARTHROPLASTY;  Surgeon: Cammy Copa, MD;  Location: [facility] OR;  Service: Orthopedics;  Laterality: Left;   TUBAL LIGATION     umbillical hernia     Patient Active Problem List   Diagnosis Date Noted   OA (osteoarthritis) of knee 12/28/2022   S/P total knee arthroplasty, right 12/28/2022   Low vitamin D level 11/01/2022   Leg swelling 06/28/2022   Giardiasis 08/23/2021   Tapeworm infection 08/23/2021   Leg pain, left 11/17/2020   Alopecia 05/06/2020   History of Helicobacter pylori infection 04/09/2020   Anemia 04/09/2020   History of adenomatous polyp of colon 04/09/2020   Gastric ulcer 01/29/2020   Duodenal ulcer 01/29/2020   Sacro  ilial pain 11/08/2019   Chronic back pain 09/11/2019   Immunization refused 03/11/2019   Encounter for annual physical exam 07/10/2015   Cutaneous wart 09/30/2014   Hyperlipidemia with target LDL less than 100 05/04/2012   GERD (gastroesophageal reflux disease) 03/14/2011   Chronic right-sided low back pain with right-sided sciatica 06/25/2009   Morbid obesity (HCC) 02/17/2007   Hypothyroidism 06/14/2006   Diverticulosis of colon 03/01/2006   HIATAL HERNIA WITH REFLUX, HX OF 03/01/2006    PCP: Syliva Overman MD  REFERRING PROVIDER:   Cammy Copa, MD    REFERRING DIAG: M17.11 (ICD-10-CM) - Unilateral primary osteoarthritis, right knee   THERAPY DIAG:  Arthritis of right knee  Impaired functional mobility, balance, gait, and endurance  Decreased range of motion (ROM) of right knee  Rationale for Evaluation and Treatment: Rehabilitation  ONSET DATE: 12/28/22  SUBJECTIVE:   SUBJECTIVE STATEMENT: Pain every day and has had some issues with falling asleep. Currently at 4/10. Reports her pain pills aren't helping. Ice helps ease her pain. Has started showering by herself. Some help from daughter to step in and out of the tub shower. Has been compliant with HEP and trying to keep bending her knee throughout the day.    EVAL: Coming in for R TKA. 17th of this Month. Pt received home health PT. Pt reports getting around the house just fine, badges are starting to come off. Pt lives with daughter/son and grandchildren. Before the knee replacement, pt was independent with functional mobility and ADLs. Pt currently utilizing AD for ambulation, not driving currently. Assist from daugther for transfers into tub. Pt known to this clinc with prior TSA.   PERTINENT HISTORY: Lumbar fusion PAIN:  Are you having pain? Yes: NPRS scale: 5/10 Pain location: R knee Pain description: dull pain Aggravating factors: nothing Relieving factors: pain medications, ice  PRECAUTIONS:  None  RED FLAGS: None   WEIGHT BEARING RESTRICTIONS: No  FALLS:  Has patient fallen in last 6 months? No  LIVING ENVIRONMENT: Lives with: lives with their family Lives in: House/apartment Stairs: Yes: External: 4 steps; can reach both Has following equipment at home: Dan Humphreys - 2 wheeled  OCCUPATION: Retired  PLOF: Independent  PATIENT GOALS: "go get grand-baby at school"  NEXT MD VISIT: 02/07/23  OBJECTIVE:  Note: Objective measures were completed at Evaluation unless otherwise noted.  DIAGNOSTIC FINDINGS:   PATIENT SURVEYS:  FOTO 50.1%  COGNITION: Overall cognitive status: Within functional limits for tasks assessed     SENSATION: WFL  POSTURE: No Significant postural limitations  PALPATION: Not tender to Palpation  LOWER EXTREMITY ROM:  Active ROM Right eval Left eval Right 02/01/23  Hip flexion     Hip extension     Hip abduction     Hip adduction     Hip internal rotation     Hip  external rotation     Knee flexion 37 110 60, 63 AAROM  Knee extension -5 0   Ankle dorsiflexion     Ankle plantarflexion     Ankle inversion     Ankle eversion      (Blank rows = not tested)  LOWER EXTREMITY MMT:  MMT Right eval Left eval  Hip flexion    Hip extension    Hip abduction    Hip adduction    Hip internal rotation    Hip external rotation    Knee flexion    Knee extension  4-  Ankle dorsiflexion    Ankle plantarflexion    Ankle inversion    Ankle eversion     (Blank rows = not tested)  FUNCTIONAL TESTS:  30 seconds chair stand test: 8x 2 minute walk test: 287ft w/ RW  GAIT: Distance walked: 230ft Assistive device utilized: Walker - 2 wheeled Level of assistance: Modified independence Comments: Mild R circumduction during swing, limited R knee flexion during swing. Mild antalgic gait pattern with RLE   TODAY'S TREATMENT:                                                                                                                               DATE:  02/08/23 Manual in supine. Retrograde massage over quads, hamstrings, and calf. Patella mobs all directions. Tender to palpation over later joint line and medial hamstring insertion.  Supine:  Quad sets 10x5"  TKE 15 x 5" on half bolster (Short arc quad)  SLR with quad set prior raise 10x  Heel Slides x 10 with rope  PROM: knee ext: -3 degrees Standing:  Knee drive for flexion on 8in step height 5x 20"  Step ups on 4in step height 20x Step navigation (up 8in and down 4in) Gait training with and without [facility]   02/03/23: Gait training with SPC, cueing for UE and sequence with initial 3 point step to to 2 point step thru 2RT inside // bars for heel strike, swing through, toe push off, knee flexion  Manual:  Supine with LE elevated  Retrograde massage  Patella mobs all directions Supine:  Quad sets 10x 5"  TKE 15 x 5" on half bolster  SLR with quad set prior raise 10x  Heel slide 10x 5-10" holds at end range   AROM 4- 68 degrees Seated: AAROM heel slide 10x 10" Standing:  Knee drive for flexion on 8in step height 5x 20" Bike seat 10 rocking x 5'  02/01/23 Seated: Leg dangle for flexion Calf/hamstring stretch with strap 2 x 30" Supine: STM to right knee to increase soft tissue mobility and joint ROM x 10' AROM right knee flexion 60 degrees; 63 AAROM SAQ x 10 SAQ into SLR  x 10  Standing: Knee drives for flexion x 2' on 7" step Heel toe raises x 10   01/27/2023  -Seated knee flexion active stretch 5 x 30'' hold- cues for  reduced trunk lean compensation. -Heel slides with a strap x 10 x 3 with 30" hold on flexion  -Supine R SLR x 15 with tactile cues at distal foot for improved knee extesion -Seated LAQ 10 x 3  -Standing weight shifts x 10 bilaterally with symmetrical foot placement tactile cues at hips within Rw.  -Elevated sit/stands with even foot placement and no UE support 2 x 10- 6/10 pain. Cues for symmetry.   01/25/23 NuStep, seat 10 , arm 8,  level 1 x 5' Heel slides with a strap x 10 x 3 with 30" hold on flexion on the last rep Supine R SLR x 10 x 2 R LAQ x 3" x 10 x 2 Heel/toe raises with UE support x 10 x 2 Standing hip abd/ext  with UE support x 10 x 2   01/20/2023 PT Evaluation, findings, HEP, prognosis, and frequency   PATIENT EDUCATION:  Education details: see below Person educated: Patient Education method: Explanation and Demonstration Education comprehension: verbalized understanding  HOME EXERCISE PROGRAM: Access Code: [email or web address] URL: [email or web address] 01/25/2023 - Supine Active Straight Leg Raise  - 1-2 x daily - 7 x weekly - 2 sets - 10 reps - Heel Toe Raises with Counter Support  - 1-2 x daily - 7 x weekly - 2 sets - 10 reps - Seated Long Arc Quad  - 1-2 x daily - 7 x weekly - 2 sets - 10 reps - 3 hold - Standing Hip Extension with Counter Support  - 1-2 x daily - 7 x weekly - 2 sets - 10 reps - Standing Hip Abduction with Counter Support  - 1-2 x daily - 7 x weekly - 2 sets - 10 reps  Date: 01/20/2023 Prepared by: Starling Manns  Exercises - Supine Heel Slide  - 1 x daily - 7 x weekly - 3 sets - 10 reps - Seated Knee Flexion Extension AROM   - 1 x daily - 7 x weekly - 3 sets - 10 reps - Supine Gluteal Sets  - 1 x daily - 7 x weekly - 3 sets - 10 reps - Supine Knee Extension Stretch on Towel Roll  - 1 x daily - 7 x weekly - 3 sets - 10 reps  ASSESSMENT:  CLINICAL IMPRESSION: Pt arrived with her SPC. Session focused on ROM and strengthening.  Manual complete for edema control, patella mobs and STM to quads prior to ROM based exercises. Passive flexion is still limited and painful, but improved following contract-relax. Patent's gait without SPC has increased lateral trunk sway and decreased stride length on right side. With SPC, patient's trunk sway is decreased and has a more symmetrical stride length. When ascending stairs, patient circumducts right LE and demonstrates flexed knee  posture with a step to gait. Patient can perform a reciprocal step pattern while descending steps, but relies on railing use. Patient will continue to benefit from skilled physical therapy to address ROM and strength deficits and promote optimal return to prior level of function.    OBJECTIVE IMPAIRMENTS: Abnormal gait, decreased activity tolerance, decreased balance, decreased mobility, difficulty walking, decreased ROM, decreased strength, hypomobility, increased fascial restrictions, impaired flexibility, and pain.   ACTIVITY LIMITATIONS: carrying, lifting, bending, sitting, standing, squatting, stairs, transfers, and locomotion level  PARTICIPATION LIMITATIONS: driving, shopping, community activity, occupation, and yard work  PERSONAL FACTORS: Age are also affecting patient's functional outcome.   REHAB POTENTIAL: Good  CLINICAL DECISION MAKING: Stable/uncomplicated  EVALUATION COMPLEXITY: Low   GOALS: Goals reviewed with  patient? No  SHORT TERM GOALS: Target date: 11/7/024  Pt will be independent with HEP in order to demonstrate participation in Physical Therapy POC.  Baseline: Goal status: INITIAL  2.  Pt will report 3/10 pain improvement during functional activity n order to demonstrate improved pain with functional activities.  Baseline:  Goal status: INITIAL  LONG TERM GOALS: Target date: 03/17/2023  Pt will improve 30 second chair test by > 2 reps in order to demonstrate improved functional strength to return to desired activities.  Baseline: 8x Goal status: INITIAL  2.  Pt will improve 2 MWT by >152ft with LRAD in order to demonstrate improved functional ambulatory capacity in community setting.  Baseline: 248ft with RW Goal status: INITIAL  3.  Pt will improve FOTO score by > 5 points in order to demonstrate improved pain with functional goals and outcomes. Baseline: 50.1 Goal status: INITIAL  4.  Pt will improve R knee extension ROM to 0 degrees in order to  improve knee mobility during functional activities. Baseline: -5 degrees from 0 Goal status: INITIAL  5.  Pt will improve R knee flexion ROM to >110 degrees in order to improve knee mobility during functional activities. Baseline: 45 degrees currently Goal status: INITIAL    PLAN:   PT FREQUENCY: 2x/week  PT DURATION: 8 weeks  PLANNED INTERVENTIONS: 97146- PT Re-evaluation, 97110-Therapeutic exercises, 97530- Therapeutic activity, 97112- Neuromuscular re-education, 97535- Self Care, 16109- Manual therapy, [phone]- Gait training, Patient/Family education, Balance training, Stair training, and Joint mobilization  PLAN FOR NEXT SESSION: Continue POC and may progress as tolerated with emphasis on R knee ROM and strength as well as hip strengthening. May work on balance and gait. Practice walking with cane   10:46 AM, 02/08/23 Victoria Reyes, SPT  12:09 PM, 02/08/23 Victoria Reyes MPT [facility] [place] [phone] Ph:[phone]  " I agree with the following treatment note after reviewing documentation. This session was performed under the supervision of a licensed clinician."

## 2023-02-09 ENCOUNTER — Encounter: Payer: Self-pay | Admitting: Orthopedic Surgery

## 2023-02-09 ENCOUNTER — Ambulatory Visit (INDEPENDENT_AMBULATORY_CARE_PROVIDER_SITE_OTHER): Payer: Medicare HMO | Admitting: Orthopedic Surgery

## 2023-02-09 DIAGNOSIS — Z96651 Presence of right artificial knee joint: Secondary | ICD-10-CM

## 2023-02-09 MED ORDER — HYDROMORPHONE HCL 2 MG PO TABS: 2.0000 mg | ORAL_TABLET | Freq: Three times a day (TID) | ORAL | 0 refills | Status: DC | PRN

## 2023-02-10 ENCOUNTER — Other Ambulatory Visit: Payer: Self-pay | Admitting: Surgical

## 2023-02-10 ENCOUNTER — Ambulatory Visit (HOSPITAL_COMMUNITY): Payer: Medicare HMO

## 2023-02-10 ENCOUNTER — Telehealth: Payer: Self-pay

## 2023-02-10 ENCOUNTER — Telehealth: Payer: Self-pay | Admitting: Orthopedic Surgery

## 2023-02-10 DIAGNOSIS — M25661 Stiffness of right knee, not elsewhere classified: Secondary | ICD-10-CM

## 2023-02-10 DIAGNOSIS — M1711 Unilateral primary osteoarthritis, right knee: Secondary | ICD-10-CM | POA: Diagnosis not present

## 2023-02-10 DIAGNOSIS — Z7409 Other reduced mobility: Secondary | ICD-10-CM

## 2023-02-10 MED ORDER — HYDROMORPHONE HCL 2 MG PO TABS: 2.0000 mg | ORAL_TABLET | Freq: Three times a day (TID) | ORAL | 0 refills | Status: DC | PRN

## 2023-02-10 NOTE — Telephone Encounter (Signed)
Patient called and said that her pharmacy is out of the medication. Can you send the medication to [facility] on [address] In [place]. CB#[phone]

## 2023-02-10 NOTE — Telephone Encounter (Signed)
I sent in a refill for her Dilaudid to the [facility] in [place].  Earlier message said that the [facility] was out [facility].

## 2023-02-10 NOTE — Telephone Encounter (Signed)
Called and advised.

## 2023-02-10 NOTE — Telephone Encounter (Signed)
I called and lm on vm to advise to cb with any questions.

## 2023-02-10 NOTE — Telephone Encounter (Signed)
Sent in

## 2023-02-10 NOTE — Telephone Encounter (Signed)
[facility] called and states that the rx for dilaudid sent today needs to be sent to the [facility] in [place] that she is not a client of theirs.

## 2023-02-10 NOTE — Therapy (Signed)
OUTPATIENT PHYSICAL THERAPY LOWER EXTREMITY TREATMENT   Patient Name: Victoria Reyes MRN: [id number] DOB:1951/03/08, 72 y.o., female Today's Date: 02/10/2023  END OF SESSION:  PT End of Session - 02/10/23 0931     Visit Number 7    Number of Visits 12    Date for PT Re-Evaluation 03/17/23    Authorization Type [employer] medicare (12 visits approved)    Authorization Time Period 01/20/23-03/17/23    Authorization - Visit Number 5    Authorization - Number of Visits 12    Progress Note Due on Visit 10    PT Start Time 0931    PT Stop Time 1015    PT Time Calculation (min) 44 min    Activity Tolerance Patient tolerated treatment well    Behavior During Therapy [facility] for tasks assessed/performed             Past Medical History:  Diagnosis Date   Anemia    as a  young woman   Diverticulitis 2005   CT    DJD (degenerative joint disease)    GERD (gastroesophageal reflux disease)    H. pylori infection 01/2020   S/p treatment with omeprazole, amoxicillin, and Biaxin.   Headache(784.0)    Hyperlipidemia    Hypothyroidism    Knee pain, right    Low back pain    Neuropathy    peroneal nerve   Neuropathy    left lower leg from back surgery per patient   Obesity    Pneumonia    as child   S/P colonoscopy 2005   Dr. Katrinka Blazing: sigmoid diverticulosis   Thyroid disease    Tobacco abuse    Past Surgical History:  Procedure Laterality Date   BACK SURGERY     BIOPSY  01/29/2020   Procedure: BIOPSY;  Surgeon: Dolores Frame, MD;  Location: [facility] ENDO SU[facility];  Service: Gastroenterology;;   BIOPSY  05/13/2020   Procedure: BIOPSY;  Surgeon: Lanelle Bal, DO;  Location: [facility] ENDOSCOPY;  Service: Endoscopy;;   COLONOSCOPY  12/18/2010   Procedure: COLONOSCOPY;  Surgeon: Arlyce Harman, MD; 3 mm sessile polyp at hepatic flexure, pancolonic diverticulosis, internal hemorrhoids, anal lesion just above the dentate line biopsied via cold forceps.  Colonic polyp was a tubular  adenoma.  Anus biopsy with mild active inflammation associated with reactive epithelial changes and hemorrhage.  Recommended repeat colonoscopy in 2022.   COLONOSCOPY WITH PROPOFOL N/A 05/13/2020   Procedure: COLONOSCOPY WITH PROPOFOL;  Surgeon: Lanelle Bal, DO;  Location: [facility] ENDOSCOPY;  Service: Endoscopy;  Laterality: N/A;  12:30PM   cyst removal right wrist     ESOPHAGOGASTRODUODENOSCOPY (EGD) WITH PROPOFOL N/A 01/29/2020   Procedure: ESOPHAGOGASTRODUODENOSCOPY (EGD) WITH PROPOFOL;  Surgeon: Marguerita Merles, Reuel Boom, MD;   3 gastric ulcers with clean base, 5 ulcers in the duodenal bulb and first portion of duodenum, 2 were noted to be oozing, treated with bipolar cautery.  Gastric biopsies consistent with H. pylori gastritis.   ESOPHAGOGASTRODUODENOSCOPY (EGD) WITH PROPOFOL N/A 05/13/2020   Procedure: ESOPHAGOGASTRODUODENOSCOPY (EGD) WITH PROPOFOL;  Surgeon: Lanelle Bal, DO;  Location: [facility] ENDOSCOPY;  Service: Endoscopy;  Laterality: N/A;   EYE SURGERY Bilateral    cataract surgery with lens implant   POLYPECTOMY  05/13/2020   Procedure: POLYPECTOMY;  Surgeon: Lanelle Bal, DO;  Location: [facility] ENDOSCOPY;  Service: Endoscopy;;   TONSILLECTOMY     TOTAL KNEE ARTHROPLASTY Right 12/28/2022   Procedure: TOTAL KNEE ARTHROPLASTY;  Surgeon: Cammy Copa, MD;  Location:  [facility] OR;  Service: Orthopedics;  Laterality: Right;   TOTAL SHOULDER ARTHROPLASTY Left 12/21/2016   Procedure: TOTAL SHOULDER ARTHROPLASTY;  Surgeon: Cammy Copa, MD;  Location: [facility] OR;  Service: Orthopedics;  Laterality: Left;   TUBAL LIGATION     umbillical hernia     Patient Active Problem List   Diagnosis Date Noted   OA (osteoarthritis) of knee 12/28/2022   S/P total knee arthroplasty, right 12/28/2022   Low vitamin D level 11/01/2022   Leg swelling 06/28/2022   Giardiasis 08/23/2021   Tapeworm infection 08/23/2021   Leg pain, left 11/17/2020   Alopecia 05/06/2020   History of Helicobacter pylori  infection 04/09/2020   Anemia 04/09/2020   History of adenomatous polyp of colon 04/09/2020   Gastric ulcer 01/29/2020   Duodenal ulcer 01/29/2020   Sacro ilial pain 11/08/2019   Chronic back pain 09/11/2019   Immunization refused 03/11/2019   Encounter for annual physical exam 07/10/2015   Cutaneous wart 09/30/2014   Hyperlipidemia with target LDL less than 100 05/04/2012   GERD (gastroesophageal reflux disease) 03/14/2011   Chronic right-sided low back pain with right-sided sciatica 06/25/2009   Morbid obesity (HCC) 02/17/2007   Hypothyroidism 06/14/2006   Diverticulosis of colon 03/01/2006   HIATAL HERNIA WITH REFLUX, HX OF 03/01/2006    PCP: Syliva Overman MD  REFERRING PROVIDER:   Cammy Copa, MD    REFERRING DIAG: M17.11 (ICD-10-CM) - Unilateral primary osteoarthritis, right knee   THERAPY DIAG:  Arthritis of right knee  Impaired functional mobility, balance, gait, and endurance  Decreased range of motion (ROM) of right knee  Rationale for Evaluation and Treatment: Rehabilitation  ONSET DATE: 12/28/22  SUBJECTIVE:   SUBJECTIVE STATEMENT: Saw MD yesterday; removed remaining steri-strips; also cautioned he is considering a manipulation if knee flexion does not improve.     EVAL: Coming in for R TKA. 17th of this Month. Pt received home health PT. Pt reports getting around the house just fine, badges are starting to come off. Pt lives with daughter/son and grandchildren. Before the knee replacement, pt was independent with functional mobility and ADLs. Pt currently utilizing AD for ambulation, not driving currently. Assist from daugther for transfers into tub. Pt known to this clinc with prior TSA.   PERTINENT HISTORY: Lumbar fusion PAIN:  Are you having pain? Yes: NPRS scale: 5/10 Pain location: R knee Pain description: dull pain Aggravating factors: nothing Relieving factors: pain medications, ice  PRECAUTIONS: None  RED FLAGS: None   WEIGHT  BEARING RESTRICTIONS: No  FALLS:  Has patient fallen in last 6 months? No  LIVING ENVIRONMENT: Lives with: lives with their family Lives in: House/apartment Stairs: Yes: External: 4 steps; can reach both Has following equipment at home: Dan Humphreys - 2 wheeled  OCCUPATION: Retired  PLOF: Independent  PATIENT GOALS: "go get grand-baby at school"  NEXT MD VISIT: 02/07/23  OBJECTIVE:  Note: Objective measures were completed at Evaluation unless otherwise noted.  DIAGNOSTIC FINDINGS:   PATIENT SURVEYS:  FOTO 50.1%  COGNITION: Overall cognitive status: Within functional limits for tasks assessed     SENSATION: WFL  POSTURE: No Significant postural limitations  PALPATION: Not tender to Palpation  LOWER EXTREMITY ROM:  Active ROM Right eval Left eval Right 02/01/23 Right 02/10/23  Hip flexion      Hip extension      Hip abduction      Hip adduction      Hip internal rotation      Hip external rotation  Knee flexion 37 110 60, 63 AAROM 75  Knee extension -5 0  -3 (lacking)  Ankle dorsiflexion      Ankle plantarflexion      Ankle inversion      Ankle eversion       (Blank rows = not tested)  LOWER EXTREMITY MMT:  MMT Right eval Left eval  Hip flexion    Hip extension    Hip abduction    Hip adduction    Hip internal rotation    Hip external rotation    Knee flexion    Knee extension  4-  Ankle dorsiflexion    Ankle plantarflexion    Ankle inversion    Ankle eversion     (Blank rows = not tested)  FUNCTIONAL TESTS:  30 seconds chair stand test: 8x 2 minute walk test: 247ft w/ RW  GAIT: Distance walked: 278ft Assistive device utilized: Walker - 2 wheeled Level of assistance: Modified independence Comments: Mild R circumduction during swing, limited R knee flexion during swing. Mild antalgic gait pattern with RLE   TODAY'S TREATMENT:                                                                                                                               DATE:  02/10/2023 STM and man ROM to right knee in supine and sitting with contract relax x 10' to improve soft tissue extensibility and right knee mobility Standing: 12" box Right knee drives for flexion x 2' Heel raises 2 x 10 Squats 2 x 10 Nustep seat 8 arms 10 for mobility x 5' AROM right knee in supine -3 to 75 Seated heel slides x 5 with overpressure from left leg    02/08/23 Manual in supine. Retrograde massage over quads, hamstrings, and calf. Patella mobs all directions. Tender to palpation over later joint line and medial hamstring insertion.  Supine:  Quad sets 10x5"  TKE 15 x 5" on half bolster (Short arc quad)  SLR with quad set prior raise 10x  Heel Slides x 10 with rope  PROM: knee ext: -3 degrees Standing:  Knee drive for flexion on 8in step height 5x 20"  Step ups on 4in step height 20x Step navigation (up 8in and down 4in) Gait training with and without [facility]   02/03/23: Gait training with SPC, cueing for UE and sequence with initial 3 point step to to 2 point step thru 2RT inside // bars for heel strike, swing through, toe push off, knee flexion  Manual:  Supine with LE elevated  Retrograde massage  Patella mobs all directions Supine:  Quad sets 10x 5"  TKE 15 x 5" on half bolster  SLR with quad set prior raise 10x  Heel slide 10x 5-10" holds at end range   AROM 4- 68 degrees Seated: AAROM heel slide 10x 10" Standing:  Knee drive for flexion on 8in step height 5x 20" Bike seat 10 rocking x 5'  02/01/23 Seated: Leg dangle for flexion Calf/hamstring stretch with strap 2 x 30" Supine: STM to right knee to increase soft tissue mobility and joint ROM x 10' AROM right knee flexion 60 degrees; 63 AAROM SAQ x 10 SAQ into SLR  x 10  Standing: Knee drives for flexion x 2' on 7" step Heel toe raises x 10   01/27/2023  -Seated knee flexion active stretch 5 x 30'' hold- cues for reduced trunk lean compensation. -Heel slides with a  strap x 10 x 3 with 30" hold on flexion  -Supine R SLR x 15 with tactile cues at distal foot for improved knee extesion -Seated LAQ 10 x 3  -Standing weight shifts x 10 bilaterally with symmetrical foot placement tactile cues at hips within Rw.  -Elevated sit/stands with even foot placement and no UE support 2 x 10- 6/10 pain. Cues for symmetry.   01/25/23 NuStep, seat 10 , arm 8, level 1 x 5' Heel slides with a strap x 10 x 3 with 30" hold on flexion on the last rep Supine R SLR x 10 x 2 R LAQ x 3" x 10 x 2 Heel/toe raises with UE support x 10 x 2 Standing hip abd/ext  with UE support x 10 x 2   01/20/2023 PT Evaluation, findings, HEP, prognosis, and frequency   PATIENT EDUCATION:  Education details: see below Person educated: Patient Education method: Explanation and Demonstration Education comprehension: verbalized understanding  HOME EXERCISE PROGRAM: Access Code: [email or web address] URL: [email or web address] 01/25/2023 - Supine Active Straight Leg Raise  - 1-2 x daily - 7 x weekly - 2 sets - 10 reps - Heel Toe Raises with Counter Support  - 1-2 x daily - 7 x weekly - 2 sets - 10 reps - Seated Long Arc Quad  - 1-2 x daily - 7 x weekly - 2 sets - 10 reps - 3 hold - Standing Hip Extension with Counter Support  - 1-2 x daily - 7 x weekly - 2 sets - 10 reps - Standing Hip Abduction with Counter Support  - 1-2 x daily - 7 x weekly - 2 sets - 10 reps  Date: 01/20/2023 Prepared by: Starling Manns  Exercises - Supine Heel Slide  - 1 x daily - 7 x weekly - 3 sets - 10 reps - Seated Knee Flexion Extension AROM   - 1 x daily - 7 x weekly - 3 sets - 10 reps - Supine Gluteal Sets  - 1 x daily - 7 x weekly - 3 sets - 10 reps - Supine Knee Extension Stretch on Towel Roll  - 1 x daily - 7 x weekly - 3 sets - 10 reps  ASSESSMENT:  CLINICAL IMPRESSION: Reports not using cane at home although using cane on arrival.  Noted steri-strips now gone; well healing incision.  Patient  with  continued tightness knee flexion especially but making good improvement from last week.  Encouraged patient to work hard on knee flexion at home. Patient will continue to benefit from skilled physical therapy to address ROM and strength deficits and promote optimal return to prior level of function.    OBJECTIVE IMPAIRMENTS: Abnormal gait, decreased activity tolerance, decreased balance, decreased mobility, difficulty walking, decreased ROM, decreased strength, hypomobility, increased fascial restrictions, impaired flexibility, and pain.   ACTIVITY LIMITATIONS: carrying, lifting, bending, sitting, standing, squatting, stairs, transfers, and locomotion level  PARTICIPATION LIMITATIONS: driving, shopping, community activity, occupation, and yard work  PERSONAL FACTORS: Age are also affecting patient's functional  outcome.   REHAB POTENTIAL: Good  CLINICAL DECISION MAKING: Stable/uncomplicated  EVALUATION COMPLEXITY: Low   GOALS: Goals reviewed with patient? No  SHORT TERM GOALS: Target date: 11/7/024  Pt will be independent with HEP in order to demonstrate participation in Physical Therapy POC.  Baseline: Goal status: INITIAL  2.  Pt will report 3/10 pain improvement during functional activity n order to demonstrate improved pain with functional activities.  Baseline:  Goal status: INITIAL  LONG TERM GOALS: Target date: 03/17/2023  Pt will improve 30 second chair test by > 2 reps in order to demonstrate improved functional strength to return to desired activities.  Baseline: 8x Goal status: INITIAL  2.  Pt will improve 2 MWT by >177ft with LRAD in order to demonstrate improved functional ambulatory capacity in community setting.  Baseline: 224ft with RW Goal status: INITIAL  3.  Pt will improve FOTO score by > 5 points in order to demonstrate improved pain with functional goals and outcomes. Baseline: 50.1 Goal status: INITIAL  4.  Pt will improve R knee extension ROM to 0  degrees in order to improve knee mobility during functional activities. Baseline: -5 degrees from 0 Goal status: INITIAL  5.  Pt will improve R knee flexion ROM to >110 degrees in order to improve knee mobility during functional activities. Baseline: 45 degrees currently Goal status: INITIAL    PLAN:   PT FREQUENCY: 2x/week  PT DURATION: 8 weeks  PLANNED INTERVENTIONS: 97146- PT Re-evaluation, 97110-Therapeutic exercises, 97530- Therapeutic activity, 97112- Neuromuscular re-education, 97535- Self Care, 65784- Manual therapy, [phone]- Gait training, Patient/Family education, Balance training, Stair training, and Joint mobilization  PLAN FOR NEXT SESSION: Continue POC and may progress as tolerated with emphasis on R knee ROM and strength as well as hip strengthening. May work on balance and gait. Practice walking with cane   10:16 AM, 02/10/23 Rashidah [place] Small Alexandre Lightsey MPT [facility] [place] [phone]

## 2023-02-13 ENCOUNTER — Encounter: Payer: Self-pay | Admitting: Orthopedic Surgery

## 2023-02-13 NOTE — Progress Notes (Signed)
Post-Op Visit Note   Patient: Victoria Reyes           Date of Birth: December 27, 1950           MRN: [id number] Visit Date: 02/09/2023 PCP: Kerri Perches, MD   Assessment & Plan:  Chief Complaint:  Chief Complaint  Patient presents with   Right Knee - Follow-up    Right total knee arthroplasty 12/28/2022   Visit Diagnoses: No diagnosis found.  Plan: Low this patient underwent right total knee replacement 12/28/2022.  In therapy 2 times a week.  Using a cane.  Taking Dilaudid.  Range of motion is 0-70.  2-week return with Franky Macho to decide for or against manipulation at that time.  Dilaudid refilled.  We need to start tapering that medication.  In general she is happy with the range of motion where it is but I think if we can get her closer to 90 she would have a better result.  Follow-up in 2 weeks for clinical recheck and decision at that time.  Follow-Up Instructions: No follow-ups on file.   Orders:  No orders of the defined types were placed in this encounter.  Meds ordered this encounter  Medications   DISCONTD: HYDROmorphone (DILAUDID) 2 MG tablet    Sig: Take 1 tablet (2 mg total) by mouth every 8 (eight) hours as needed for severe pain (pain score 7-10).    Dispense:  30 tablet    Refill:  0    Imaging: No results found.  PMFS History: Patient Active Problem List   Diagnosis Date Noted   OA (osteoarthritis) of knee 12/28/2022   S/P total knee arthroplasty, right 12/28/2022   Low vitamin D level 11/01/2022   Leg swelling 06/28/2022   Giardiasis 08/23/2021   Tapeworm infection 08/23/2021   Leg pain, left 11/17/2020   Alopecia 05/06/2020   History of Helicobacter pylori infection 04/09/2020   Anemia 04/09/2020   History of adenomatous polyp of colon 04/09/2020   Gastric ulcer 01/29/2020   Duodenal ulcer 01/29/2020   Sacro ilial pain 11/08/2019   Chronic back pain 09/11/2019   Immunization refused 03/11/2019   Encounter for annual physical exam 07/10/2015    Cutaneous wart 09/30/2014   Hyperlipidemia with target LDL less than 100 05/04/2012   GERD (gastroesophageal reflux disease) 03/14/2011   Chronic right-sided low back pain with right-sided sciatica 06/25/2009   Morbid obesity (HCC) 02/17/2007   Hypothyroidism 06/14/2006   Diverticulosis of colon 03/01/2006   HIATAL HERNIA WITH REFLUX, HX OF 03/01/2006   Past Medical History:  Diagnosis Date   Anemia    as a  young woman   Diverticulitis 2005   CT    DJD (degenerative joint disease)    GERD (gastroesophageal reflux disease)    H. pylori infection 01/2020   S/p treatment with omeprazole, amoxicillin, and Biaxin.   Headache(784.0)    Hyperlipidemia    Hypothyroidism    Knee pain, right    Low back pain    Neuropathy    peroneal nerve   Neuropathy    left lower leg from back surgery per patient   Obesity    Pneumonia    as child   S/P colonoscopy 2005   Dr. Katrinka Blazing: sigmoid diverticulosis   Thyroid disease    Tobacco abuse     Family History  Problem Relation Age of Onset   Heart attack Sister        pacemaker   Kidney failure Sister  on dialysis   Colon cancer Neg Hx    Stomach cancer Neg Hx    Esophageal cancer Neg Hx     Past Surgical History:  Procedure Laterality Date   BACK SURGERY     BIOPSY  01/29/2020   Procedure: BIOPSY;  Surgeon: Dolores Frame, MD;  Location: [facility] ENDO SUITE;  Service: Gastroenterology;;   BIOPSY  05/13/2020   Procedure: BIOPSY;  Surgeon: Lanelle Bal, DO;  Location: [facility] ENDOSCOPY;  Service: Endoscopy;;   COLONOSCOPY  12/18/2010   Procedure: COLONOSCOPY;  Surgeon: Arlyce Harman, MD; 3 mm sessile polyp at hepatic flexure, pancolonic diverticulosis, internal hemorrhoids, anal lesion just above the dentate line biopsied via cold forceps.  Colonic polyp was a tubular adenoma.  Anus biopsy with mild active inflammation associated with reactive epithelial changes and hemorrhage.  Recommended repeat colonoscopy in 2022.    COLONOSCOPY WITH PROPOFOL N/A 05/13/2020   Procedure: COLONOSCOPY WITH PROPOFOL;  Surgeon: Lanelle Bal, DO;  Location: [facility] ENDOSCOPY;  Service: Endoscopy;  Laterality: N/A;  12:30PM   cyst removal right wrist     ESOPHAGOGASTRODUODENOSCOPY (EGD) WITH PROPOFOL N/A 01/29/2020   Procedure: ESOPHAGOGASTRODUODENOSCOPY (EGD) WITH PROPOFOL;  Surgeon: Marguerita Merles, Reuel Boom, MD;   3 gastric ulcers with clean base, 5 ulcers in the duodenal bulb and first portion of duodenum, 2 were noted to be oozing, treated with bipolar cautery.  Gastric biopsies consistent with H. pylori gastritis.   ESOPHAGOGASTRODUODENOSCOPY (EGD) WITH PROPOFOL N/A 05/13/2020   Procedure: ESOPHAGOGASTRODUODENOSCOPY (EGD) WITH PROPOFOL;  Surgeon: Lanelle Bal, DO;  Location: [facility] ENDOSCOPY;  Service: Endoscopy;  Laterality: N/A;   EYE SURGERY Bilateral    cataract surgery with lens implant   POLYPECTOMY  05/13/2020   Procedure: POLYPECTOMY;  Surgeon: Lanelle Bal, DO;  Location: [facility] ENDOSCOPY;  Service: Endoscopy;;   TONSILLECTOMY     TOTAL KNEE ARTHROPLASTY Right 12/28/2022   Procedure: TOTAL KNEE ARTHROPLASTY;  Surgeon: Cammy Copa, MD;  Location: [facility] OR;  Service: Orthopedics;  Laterality: Right;   TOTAL SHOULDER ARTHROPLASTY Left 12/21/2016   Procedure: TOTAL SHOULDER ARTHROPLASTY;  Surgeon: Cammy Copa, MD;  Location: [facility] OR;  Service: Orthopedics;  Laterality: Left;   TUBAL LIGATION     umbillical hernia     Social History   Occupational History   Not on file  Tobacco Use   Smoking status: Former    Current packs/day: 0.00    Average packs/day: 0.3 packs/day for 51.9 years (13.0 ttl pk-yrs)    Types: Cigarettes    Start date: 09/30/1963    Quit date: 08/15/2015    Years since quitting: 7.5   Smokeless tobacco: Never  Vaping Use   Vaping status: Never Used  Substance and Sexual Activity   Alcohol use: No    Alcohol/week: 0.0 standard drinks of alcohol   Drug use: No   Sexual activity: Not  Currently    Birth control/protection: Post-menopausal

## 2023-02-14 ENCOUNTER — Telehealth: Payer: Self-pay

## 2023-02-14 NOTE — Telephone Encounter (Signed)
Has already been scheduled  

## 2023-02-15 ENCOUNTER — Ambulatory Visit (HOSPITAL_COMMUNITY): Payer: Medicare HMO | Attending: Orthopedic Surgery

## 2023-02-15 DIAGNOSIS — Z7409 Other reduced mobility: Secondary | ICD-10-CM | POA: Diagnosis not present

## 2023-02-15 DIAGNOSIS — M1711 Unilateral primary osteoarthritis, right knee: Secondary | ICD-10-CM | POA: Diagnosis not present

## 2023-02-15 DIAGNOSIS — M25661 Stiffness of right knee, not elsewhere classified: Secondary | ICD-10-CM | POA: Insufficient documentation

## 2023-02-15 NOTE — Therapy (Cosign Needed)
OUTPATIENT PHYSICAL THERAPY LOWER EXTREMITY TREATMENT   Patient Name: Victoria Reyes MRN: [id number] DOB:06/03/1950, 72 y.o., female Today's Date: 02/15/2023  END OF SESSION:  PT End of Session - 02/15/23 0956     Visit Number 8    Number of Visits 12    Date for PT Re-Evaluation 03/17/23    Authorization Type [employer] medicare (12 visits approved)    Authorization Time Period 01/20/23-03/17/23    Authorization - Number of Visits 12    Progress Note Due on Visit 10    PT Start Time 0932    PT Stop Time 1015    PT Time Calculation (min) 43 min    Activity Tolerance Patient tolerated treatment well    Behavior During Therapy [facility] for tasks assessed/performed             Past Medical History:  Diagnosis Date   Anemia    as a  young woman   Diverticulitis 2005   CT    DJD (degenerative joint disease)    GERD (gastroesophageal reflux disease)    H. pylori infection 01/2020   S/p treatment with omeprazole, amoxicillin, and Biaxin.   Headache(784.0)    Hyperlipidemia    Hypothyroidism    Knee pain, right    Low back pain    Neuropathy    peroneal nerve   Neuropathy    left lower leg from back surgery per patient   Obesity    Pneumonia    as child   S/P colonoscopy 2005   Dr. Katrinka Blazing: sigmoid diverticulosis   Thyroid disease    Tobacco abuse    Past Surgical History:  Procedure Laterality Date   BACK SURGERY     BIOPSY  01/29/2020   Procedure: BIOPSY;  Surgeon: Dolores Frame, MD;  Location: [facility];  Service: Gastroenterology;;   BIOPSY  05/13/2020   Procedure: BIOPSY;  Surgeon: Lanelle Bal, DO;  Location: [facility] ENDOSCOPY;  Service: Endoscopy;;   COLONOSCOPY  12/18/2010   Procedure: COLONOSCOPY;  Surgeon: Arlyce Harman, MD; 3 mm sessile polyp at hepatic flexure, pancolonic diverticulosis, internal hemorrhoids, anal lesion just above the dentate line biopsied via cold forceps.  Colonic polyp was a tubular adenoma.  Anus biopsy with mild active  inflammation associated with reactive epithelial changes and hemorrhage.  Recommended repeat colonoscopy in 2022.   COLONOSCOPY WITH PROPOFOL N/A 05/13/2020   Procedure: COLONOSCOPY WITH PROPOFOL;  Surgeon: Lanelle Bal, DO;  Location: [facility] ENDOSCOPY;  Service: Endoscopy;  Laterality: N/A;  12:30PM   cyst removal right wrist     ESOPHAGOGASTRODUODENOSCOPY (EGD) WITH PROPOFOL N/A 01/29/2020   Procedure: ESOPHAGOGASTRODUODENOSCOPY (EGD) WITH PROPOFOL;  Surgeon: Marguerita Merles, Reuel Boom, MD;   3 gastric ulcers with clean base, 5 ulcers in the duodenal bulb and first portion of duodenum, 2 were noted to be oozing, treated with bipolar cautery.  Gastric biopsies consistent with H. pylori gastritis.   ESOPHAGOGASTRODUODENOSCOPY (EGD) WITH PROPOFOL N/A 05/13/2020   Procedure: ESOPHAGOGASTRODUODENOSCOPY (EGD) WITH PROPOFOL;  Surgeon: Lanelle Bal, DO;  Location: [facility] ENDOSCOPY;  Service: Endoscopy;  Laterality: N/A;   EYE SURGERY Bilateral    cataract surgery with lens implant   POLYPECTOMY  05/13/2020   Procedure: POLYPECTOMY;  Surgeon: Lanelle Bal, DO;  Location: [facility] ENDOSCOPY;  Service: Endoscopy;;   TONSILLECTOMY     TOTAL KNEE ARTHROPLASTY Right 12/28/2022   Procedure: TOTAL KNEE ARTHROPLASTY;  Surgeon: Cammy Copa, MD;  Location: [facility] OR;  Service: Orthopedics;  Laterality: Right;  TOTAL SHOULDER ARTHROPLASTY Left 12/21/2016   Procedure: TOTAL SHOULDER ARTHROPLASTY;  Surgeon: Cammy Copa, MD;  Location: [facility] OR;  Service: Orthopedics;  Laterality: Left;   TUBAL LIGATION     umbillical hernia     Patient Active Problem List   Diagnosis Date Noted   OA (osteoarthritis) of knee 12/28/2022   S/P total knee arthroplasty, right 12/28/2022   Low vitamin D level 11/01/2022   Leg swelling 06/28/2022   Giardiasis 08/23/2021   Tapeworm infection 08/23/2021   Leg pain, left 11/17/2020   Alopecia 05/06/2020   History of Helicobacter pylori infection 04/09/2020   Anemia 04/09/2020    History of adenomatous polyp of colon 04/09/2020   Gastric ulcer 01/29/2020   Duodenal ulcer 01/29/2020   Sacro ilial pain 11/08/2019   Chronic back pain 09/11/2019   Immunization refused 03/11/2019   Encounter for annual physical exam 07/10/2015   Cutaneous wart 09/30/2014   Hyperlipidemia with target LDL less than 100 05/04/2012   GERD (gastroesophageal reflux disease) 03/14/2011   Chronic right-sided low back pain with right-sided sciatica 06/25/2009   Morbid obesity (HCC) 02/17/2007   Hypothyroidism 06/14/2006   Diverticulosis of colon 03/01/2006   HIATAL HERNIA WITH REFLUX, HX OF 03/01/2006    PCP: Syliva Overman MD  REFERRING PROVIDER:   Cammy Copa, MD    REFERRING DIAG: M17.11 (ICD-10-CM) - Unilateral primary osteoarthritis, right knee   THERAPY DIAG:  Decreased range of motion (ROM) of right knee  Impaired functional mobility, balance, gait, and endurance  Arthritis of right knee  Rationale for Evaluation and Treatment: Rehabilitation  ONSET DATE: 12/28/22  SUBJECTIVE:   SUBJECTIVE STATEMENT: Patient is very motivated to get her knee bending. She has done her exercises everyday since last session. She doesn't feel too much soreness after. Spends 2+ hours a day on her CPM machine.    EVAL: Coming in for R TKA. 17th of this Month. Pt received home health PT. Pt reports getting around the house just fine, badges are starting to come off. Pt lives with daughter/son and grandchildren. Before the knee replacement, pt was independent with functional mobility and ADLs. Pt currently utilizing AD for ambulation, not driving currently. Assist from daugther for transfers into tub. Pt known to this clinc with prior TSA.   PERTINENT HISTORY: Lumbar fusion PAIN:  Are you having pain? Yes: NPRS scale: 5/10 Pain location: R knee Pain description: dull pain Aggravating factors: nothing Relieving factors: pain medications, ice  PRECAUTIONS: None  RED  FLAGS: None   WEIGHT BEARING RESTRICTIONS: No  FALLS:  Has patient fallen in last 6 months? No  LIVING ENVIRONMENT: Lives with: lives with their family Lives in: House/apartment Stairs: Yes: External: 4 steps; can reach both Has following equipment at home: Dan Humphreys - 2 wheeled  OCCUPATION: Retired  PLOF: Independent  PATIENT GOALS: "go get grand-baby at school"  NEXT MD VISIT: 02/07/23  OBJECTIVE:  Note: Objective measures were completed at Evaluation unless otherwise noted.  DIAGNOSTIC FINDINGS:   PATIENT SURVEYS:  FOTO 50.1%  COGNITION: Overall cognitive status: Within functional limits for tasks assessed     SENSATION: WFL  POSTURE: No Significant postural limitations  PALPATION: Not tender to Palpation  LOWER EXTREMITY ROM:  Active ROM Right eval Left eval Right 02/01/23 Right 02/10/23  Hip flexion      Hip extension      Hip abduction      Hip adduction      Hip internal rotation  Hip external rotation      Knee flexion 37 110 60, 63 AAROM 75  Knee extension -5 0  -3 (lacking)  Ankle dorsiflexion      Ankle plantarflexion      Ankle inversion      Ankle eversion       (Blank rows = not tested)  LOWER EXTREMITY MMT:  MMT Right eval Left eval  Hip flexion    Hip extension    Hip abduction    Hip adduction    Hip internal rotation    Hip external rotation    Knee flexion    Knee extension  4-  Ankle dorsiflexion    Ankle plantarflexion    Ankle inversion    Ankle eversion     (Blank rows = not tested)  FUNCTIONAL TESTS:  30 seconds chair stand test: 8x 2 minute walk test: 275ft w/ RW  GAIT: Distance walked: 271ft Assistive device utilized: Walker - 2 wheeled Level of assistance: Modified independence Comments: Mild R circumduction during swing, limited R knee flexion during swing. Mild antalgic gait pattern with RLE   TODAY'S TREATMENT:                                                                                                                               DATE:  02/15/23 STM and man ROM to right knee in supine and sitting with contract relax x15' to improve soft tissue extensibility and right knee mobility Heel slides AAROM with strap 3' Squats to elevated mat 2x15;  Heel raises 2x15 with hand support Education about exercise modification, scar massage, and mobility at home  Measured knee flexion in supine at EOS. 80 AROM, 83 AAROM   02/10/2023 STM and man ROM to right knee in supine and sitting with contract relax x 10' to improve soft tissue extensibility and right knee mobility Standing: 12" box Right knee drives for flexion x 2' Heel raises 2 x 10 Squats 2 x 10 Nustep seat 8 arms 10 for mobility x 5' AROM right knee in supine -3 to 75 Seated heel slides x 5 with overpressure from left leg    02/08/23 Manual in supine. Retrograde massage over quads, hamstrings, and calf. Patella mobs all directions. Tender to palpation over later joint line and medial hamstring insertion.  Supine:  Quad sets 10x5"  TKE 15 x 5" on half bolster (Short arc quad)  SLR with quad set prior raise 10x  Heel Slides x 10 with rope  PROM: knee ext: -3 degrees Standing:  Knee drive for flexion on 8in step height 5x 20"  Step ups on 4in step height 20x Step navigation (up 8in and down 4in) Gait training with and without [facility]   02/03/23: Gait training with SPC, cueing for UE and sequence with initial 3 point step to to 2 point step thru 2RT inside // bars for heel strike, swing through, toe push off, knee flexion  Manual:  Supine with LE elevated  Retrograde massage  Patella mobs all directions Supine:  Quad sets 10x 5"  TKE 15 x 5" on half bolster  SLR with quad set prior raise 10x  Heel slide 10x 5-10" holds at end range   AROM 4- 68 degrees Seated: AAROM heel slide 10x 10" Standing:  Knee drive for flexion on 8in step height 5x 20" Bike seat 10 rocking x 5'  02/01/23 Seated: Leg dangle for  flexion Calf/hamstring stretch with strap 2 x 30" Supine: STM to right knee to increase soft tissue mobility and joint ROM x 10' AROM right knee flexion 60 degrees; 63 AAROM SAQ x 10 SAQ into SLR  x 10  Standing: Knee drives for flexion x 2' on 7" step Heel toe raises x 10   01/27/2023  -Seated knee flexion active stretch 5 x 30'' hold- cues for reduced trunk lean compensation. -Heel slides with a strap x 10 x 3 with 30" hold on flexion  -Supine R SLR x 15 with tactile cues at distal foot for improved knee extesion -Seated LAQ 10 x 3  -Standing weight shifts x 10 bilaterally with symmetrical foot placement tactile cues at hips within Rw.  -Elevated sit/stands with even foot placement and no UE support 2 x 10- 6/10 pain. Cues for symmetry.   01/25/23 NuStep, seat 10 , arm 8, level 1 x 5' Heel slides with a strap x 10 x 3 with 30" hold on flexion on the last rep Supine R SLR x 10 x 2 R LAQ x 3" x 10 x 2 Heel/toe raises with UE support x 10 x 2 Standing hip abd/ext  with UE support x 10 x 2   01/20/2023 PT Evaluation, findings, HEP, prognosis, and frequency   PATIENT EDUCATION:  Education details: see below Person educated: Patient Education method: Explanation and Demonstration Education comprehension: verbalized understanding  HOME EXERCISE PROGRAM: Access Code: [email or web address] URL: [email or web address] 01/25/2023 - Supine Active Straight Leg Raise  - 1-2 x daily - 7 x weekly - 2 sets - 10 reps - Heel Toe Raises with Counter Support  - 1-2 x daily - 7 x weekly - 2 sets - 10 reps - Seated Long Arc Quad  - 1-2 x daily - 7 x weekly - 2 sets - 10 reps - 3 hold - Standing Hip Extension with Counter Support  - 1-2 x daily - 7 x weekly - 2 sets - 10 reps - Standing Hip Abduction with Counter Support  - 1-2 x daily - 7 x weekly - 2 sets - 10 reps  Date: 01/20/2023 Prepared by: Starling Manns  Exercises - Supine Heel Slide  - 1 x daily - 7 x weekly - 3 sets - 10  reps - Seated Knee Flexion Extension AROM   - 1 x daily - 7 x weekly - 3 sets - 10 reps - Supine Gluteal Sets  - 1 x daily - 7 x weekly - 3 sets - 10 reps - Supine Knee Extension Stretch on Towel Roll  - 1 x daily - 7 x weekly - 3 sets - 10 reps  ASSESSMENT:  CLINICAL IMPRESSION: STM and manual PROM/AAROM continues to demonstrate tightness with knee flexion especially but making improvement from last week.  Encouraged patient to continue to work hard on knee flexion at home. Patient had a difficult time recalling how to modify sit-to-stand/squat to have right foot further back. Understands importance of scar massage and active ROM during exercise. Patient will  continue to benefit from skilled physical therapy to address ROM and strength deficits and promote optimal return to prior level of function.    OBJECTIVE IMPAIRMENTS: Abnormal gait, decreased activity tolerance, decreased balance, decreased mobility, difficulty walking, decreased ROM, decreased strength, hypomobility, increased fascial restrictions, impaired flexibility, and pain.   ACTIVITY LIMITATIONS: carrying, lifting, bending, sitting, standing, squatting, stairs, transfers, and locomotion level  PARTICIPATION LIMITATIONS: driving, shopping, community activity, occupation, and yard work  PERSONAL FACTORS: Age are also affecting patient's functional outcome.   REHAB POTENTIAL: Good  CLINICAL DECISION MAKING: Stable/uncomplicated  EVALUATION COMPLEXITY: Low   GOALS: Goals reviewed with patient? No  SHORT TERM GOALS: Target date: 11/7/024  Pt will be independent with HEP in order to demonstrate participation in Physical Therapy POC.  Baseline: Goal status: INITIAL  2.  Pt will report 3/10 pain improvement during functional activity n order to demonstrate improved pain with functional activities.  Baseline:  Goal status: INITIAL  LONG TERM GOALS: Target date: 03/17/2023  Pt will improve 30 second chair test by > 2 reps  in order to demonstrate improved functional strength to return to desired activities.  Baseline: 8x Goal status: INITIAL  2.  Pt will improve 2 MWT by >14ft with LRAD in order to demonstrate improved functional ambulatory capacity in community setting.  Baseline: 238ft with RW Goal status: INITIAL  3.  Pt will improve FOTO score by > 5 points in order to demonstrate improved pain with functional goals and outcomes. Baseline: 50.1 Goal status: INITIAL  4.  Pt will improve R knee extension ROM to 0 degrees in order to improve knee mobility during functional activities. Baseline: -5 degrees from 0 Goal status: INITIAL  5.  Pt will improve R knee flexion ROM to >110 degrees in order to improve knee mobility during functional activities. Baseline: 45 degrees currently Goal status: INITIAL    PLAN:   PT FREQUENCY: 2x/week  PT DURATION: 8 weeks  PLANNED INTERVENTIONS: 97146- PT Re-evaluation, 97110-Therapeutic exercises, 97530- Therapeutic activity, 97112- Neuromuscular re-education, 97535- Self Care, 16109- Manual therapy, [id number]- Gait training, Patient/Family education, Balance training, Stair training, and Joint mobilization  PLAN FOR NEXT SESSION: Continue POC and may progress as tolerated with emphasis on R knee ROM and strength as well as hip strengthening. May work on balance and gait. Practice walking with cane    10:38 AM, 02/15/23 Karna Christmas, SPT  " I agree with the following treatment note after reviewing documentation. This session was performed under the supervision of a licensed clinician."    9:36 AM, 02/17/23 Amy Small Lynch MPT [facility] [place] [phone]

## 2023-02-17 ENCOUNTER — Ambulatory Visit (HOSPITAL_COMMUNITY): Payer: Medicare HMO

## 2023-02-17 DIAGNOSIS — M1711 Unilateral primary osteoarthritis, right knee: Secondary | ICD-10-CM

## 2023-02-17 DIAGNOSIS — M25661 Stiffness of right knee, not elsewhere classified: Secondary | ICD-10-CM

## 2023-02-17 DIAGNOSIS — Z7409 Other reduced mobility: Secondary | ICD-10-CM | POA: Diagnosis not present

## 2023-02-17 NOTE — Therapy (Addendum)
OUTPATIENT PHYSICAL THERAPY LOWER EXTREMITY TREATMENT   Patient Name: Victoria Reyes MRN: [id number] DOB:04-Dec-1950, 72 y.o., female Today's Date: 02/17/2023  END OF SESSION:  PT End of Session - 02/17/23 0930     Visit Number 9    Number of Visits 12    Date for PT Re-Evaluation 03/17/23    Authorization Type [employer] medicare (12 visits approved)    Authorization Time Period 01/20/23-03/17/23    Authorization - Number of Visits 12    Progress Note Due on Visit 10    PT Start Time 0930    PT Stop Time 1015    PT Time Calculation (min) 45 min    Activity Tolerance Patient tolerated treatment well    Behavior During Therapy [facility] for tasks assessed/performed             Past Medical History:  Diagnosis Date   Anemia    as a  young woman   Diverticulitis 2005   CT    DJD (degenerative joint disease)    GERD (gastroesophageal reflux disease)    H. pylori infection 01/2020   S/p treatment with omeprazole, amoxicillin, and Biaxin.   Headache(784.0)    Hyperlipidemia    Hypothyroidism    Knee pain, right    Low back pain    Neuropathy    peroneal nerve   Neuropathy    left lower leg from back surgery per patient   Obesity    Pneumonia    as child   S/P colonoscopy 2005   Dr. Katrinka Blazing: sigmoid diverticulosis   Thyroid disease    Tobacco abuse    Past Surgical History:  Procedure Laterality Date   BACK SURGERY     BIOPSY  01/29/2020   Procedure: BIOPSY;  Surgeon: Dolores Frame, MD;  Location: [facility];  Service: Gastroenterology;;   BIOPSY  05/13/2020   Procedure: BIOPSY;  Surgeon: Lanelle Bal, DO;  Location: [facility] ENDOSCOPY;  Service: Endoscopy;;   COLONOSCOPY  12/18/2010   Procedure: COLONOSCOPY;  Surgeon: Arlyce Harman, MD; 3 mm sessile polyp at hepatic flexure, pancolonic diverticulosis, internal hemorrhoids, anal lesion just above the dentate line biopsied via cold forceps.  Colonic polyp was a tubular adenoma.  Anus biopsy with mild active  inflammation associated with reactive epithelial changes and hemorrhage.  Recommended repeat colonoscopy in 2022.   COLONOSCOPY WITH PROPOFOL N/A 05/13/2020   Procedure: COLONOSCOPY WITH PROPOFOL;  Surgeon: Lanelle Bal, DO;  Location: [facility] ENDOSCOPY;  Service: Endoscopy;  Laterality: N/A;  12:30PM   cyst removal right wrist     ESOPHAGOGASTRODUODENOSCOPY (EGD) WITH PROPOFOL N/A 01/29/2020   Procedure: ESOPHAGOGASTRODUODENOSCOPY (EGD) WITH PROPOFOL;  Surgeon: Marguerita Merles, Reuel Boom, MD;   3 gastric ulcers with clean base, 5 ulcers in the duodenal bulb and first portion of duodenum, 2 were noted to be oozing, treated with bipolar cautery.  Gastric biopsies consistent with H. pylori gastritis.   ESOPHAGOGASTRODUODENOSCOPY (EGD) WITH PROPOFOL N/A 05/13/2020   Procedure: ESOPHAGOGASTRODUODENOSCOPY (EGD) WITH PROPOFOL;  Surgeon: Lanelle Bal, DO;  Location[facility] ENDOSCOPY;  Service: Endoscopy;  Laterality: N/A;   EYE SURGERY Bilateral    cataract surgery with lens implant   POLYPECTOMY  05/13/2020   Procedure: POLYPECTOMY;  Surgeon: Lanelle Bal, DO;  Location: [facility] ENDOSCOPY;  Service: Endoscopy;;   TONSILLECTOMY     TOTAL KNEE ARTHROPLASTY Right 12/28/2022   Procedure: TOTAL KNEE ARTHROPLASTY;  Surgeon: Cammy Copa, MD;  Location: [facility] OR;  Service: Orthopedics;  Laterality: Right;  TOTAL SHOULDER ARTHROPLASTY Left 12/21/2016   Procedure: TOTAL SHOULDER ARTHROPLASTY;  Surgeon: Cammy Copa, MD;  Location: [facility] OR;  Service: Orthopedics;  Laterality: Left;   TUBAL LIGATION     umbillical hernia     Patient Active Problem List   Diagnosis Date Noted   OA (osteoarthritis) of knee 12/28/2022   S/P total knee arthroplasty, right 12/28/2022   Low vitamin D level 11/01/2022   Leg swelling 06/28/2022   Giardiasis 08/23/2021   Tapeworm infection 08/23/2021   Leg pain, left 11/17/2020   Alopecia 05/06/2020   History of Helicobacter pylori infection 04/09/2020   Anemia 04/09/2020    History of adenomatous polyp of colon 04/09/2020   Gastric ulcer 01/29/2020   Duodenal ulcer 01/29/2020   Sacro ilial pain 11/08/2019   Chronic back pain 09/11/2019   Immunization refused 03/11/2019   Encounter for annual physical exam 07/10/2015   Cutaneous wart 09/30/2014   Hyperlipidemia with target LDL less than 100 05/04/2012   GERD (gastroesophageal reflux disease) 03/14/2011   Chronic right-sided low back pain with right-sided sciatica 06/25/2009   Morbid obesity (HCC) 02/17/2007   Hypothyroidism 06/14/2006   Diverticulosis of colon 03/01/2006   HIATAL HERNIA WITH REFLUX, HX OF 03/01/2006    PCP: Syliva Overman MD  REFERRING PROVIDER:   Cammy Copa, MD    REFERRING DIAG: M17.11 (ICD-10-CM) - Unilateral primary osteoarthritis, right knee   THERAPY DIAG:  Decreased range of motion (ROM) of right knee  Impaired functional mobility, balance, gait, and endurance  Arthritis of right knee  Rationale for Evaluation and Treatment: Rehabilitation  ONSET DATE: 12/28/22  SUBJECTIVE:   SUBJECTIVE STATEMENT: Patient is very motivated to get her knee bending. She does her exercises everyday. She doesn't feel too much soreness after PT. Spends 2+ hours a day on her CPM machine. Has been doing scar massage. Feels tenderness inferior lateral to patella. Notices less swelling.    EVAL: Coming in for R TKA. 17th of this Month. Pt received home health PT. Pt reports getting around the house just fine, badges are starting to come off. Pt lives with daughter/son and grandchildren. Before the knee replacement, pt was independent with functional mobility and ADLs. Pt currently utilizing AD for ambulation, not driving currently. Assist from daugther for transfers into tub. Pt known to this clinc with prior TSA.   PERTINENT HISTORY: Lumbar fusion PAIN:  Are you having pain? Yes: NPRS scale: 5/10 Pain location: R knee Pain description: dull pain Aggravating factors:  nothing Relieving factors: pain medications, ice  PRECAUTIONS: None  RED FLAGS: None   WEIGHT BEARING RESTRICTIONS: No  FALLS:  Has patient fallen in last 6 months? No  LIVING ENVIRONMENT: Lives with: lives with their family Lives in: House/apartment Stairs: Yes: External: 4 steps; can reach both Has following equipment at home: Dan Humphreys - 2 wheeled  OCCUPATION: Retired  PLOF: Independent  PATIENT GOALS: "go get grand-baby at school"  NEXT MD VISIT: 02/07/23  OBJECTIVE:  Note: Objective measures were completed at Evaluation unless otherwise noted.  DIAGNOSTIC FINDINGS:   PATIENT SURVEYS:  FOTO 50.1%  COGNITION: Overall cognitive status: Within functional limits for tasks assessed     SENSATION: WFL  POSTURE: No Significant postural limitations  PALPATION: Not tender to Palpation  LOWER EXTREMITY ROM:  Active ROM Right eval Left eval Right 02/01/23 Right 02/10/23  Hip flexion      Hip extension      Hip abduction      Hip adduction  Hip internal rotation      Hip external rotation      Knee flexion 37 110 60, 63 AAROM 75  Knee extension -5 0  -3 (lacking)  Ankle dorsiflexion      Ankle plantarflexion      Ankle inversion      Ankle eversion       (Blank rows = not tested)  LOWER EXTREMITY MMT:  MMT Right eval Left eval  Hip flexion    Hip extension    Hip abduction    Hip adduction    Hip internal rotation    Hip external rotation    Knee flexion    Knee extension  4-  Ankle dorsiflexion    Ankle plantarflexion    Ankle inversion    Ankle eversion     (Blank rows = not tested)  FUNCTIONAL TESTS:  30 seconds chair stand test: 8x 2 minute walk test: 253ft w/ RW  GAIT: Distance walked: 2110ft Assistive device utilized: Walker - 2 wheeled Level of assistance: Modified independence Comments: Mild R circumduction during swing, limited R knee flexion during swing. Mild antalgic gait pattern with RLE   TODAY'S TREATMENT:                                                                                                                               DATE:  02/17/2023 STM and man ROM to right knee in supine and sitting with contract relax x15' to improve soft tissue extensibility and right knee mobility Supine SLR 2 x until fatigue SAQ 2 x until fatigue  Modified thomas stretch 2x30 sec paired with x15 LAQ between sets Mini squats to elevated mat 3x15    02/15/23 STM and man ROM to right knee in supine and sitting with contract relax x15' to improve soft tissue extensibility and right knee mobility Heel slides AAROM with strap 3' Squats to elevated mat 2x15;  Heel raises 2x15 with hand support Education about exercise modification, scar massage, and mobility at home  Measured knee flexion in supine at EOS. 80 AROM, 83 AAROM   02/10/2023 STM and man ROM to right knee in supine and sitting with contract relax x 10' to improve soft tissue extensibility and right knee mobility Standing: 12" box Right knee drives for flexion x 2' Heel raises 2 x 10 Squats 2 x 10 Nustep seat 8 arms 10 for mobility x 5' AROM right knee in supine -3 to 75 Seated heel slides x 5 with overpressure from left leg   02/08/23 Manual in supine. Retrograde massage over quads, hamstrings, and calf. Patella mobs all directions. Tender to palpation over later joint line and medial hamstring insertion.  Supine:  Quad sets 10x5"  TKE 15 x 5" on half bolster (Short arc quad)  SLR with quad set prior raise 10x  Heel Slides x 10 with rope  PROM: knee ext: -3 degrees Standing:  Knee drive for flexion on 8in  step height 5x 20"  Step ups on 4in step height 20x Step navigation (up 8in and down 4in) Gait training with and without [facility]   02/03/23: Gait training with SPC, cueing for UE and sequence with initial 3 point step to to 2 point step thru 2RT inside // bars for heel strike, swing through, toe push off, knee flexion  Manual:  Supine with LE  elevated  Retrograde massage  Patella mobs all directions Supine:  Quad sets 10x 5"  TKE 15 x 5" on half bolster  SLR with quad set prior raise 10x  Heel slide 10x 5-10" holds at end range   AROM 4- 68 degrees Seated: AAROM heel slide 10x 10" Standing:  Knee drive for flexion on 8in step height 5x 20" Bike seat 10 rocking x 5'  02/01/23 Seated: Leg dangle for flexion Calf/hamstring stretch with strap 2 x 30" Supine: STM to right knee to increase soft tissue mobility and joint ROM x 10' AROM right knee flexion 60 degrees; 63 AAROM SAQ x 10 SAQ into SLR  x 10  Standing: Knee drives for flexion x 2' on 7" step Heel toe raises x 10   01/27/2023  -Seated knee flexion active stretch 5 x 30'' hold- cues for reduced trunk lean compensation. -Heel slides with a strap x 10 x 3 with 30" hold on flexion  -Supine R SLR x 15 with tactile cues at distal foot for improved knee extesion -Seated LAQ 10 x 3  -Standing weight shifts x 10 bilaterally with symmetrical foot placement tactile cues at hips within Rw.  -Elevated sit/stands with even foot placement and no UE support 2 x 10- 6/10 pain. Cues for symmetry.   01/25/23 NuStep, seat 10 , arm 8, level 1 x 5' Heel slides with a strap x 10 x 3 with 30" hold on flexion on the last rep Supine R SLR x 10 x 2 R LAQ x 3" x 10 x 2 Heel/toe raises with UE support x 10 x 2 Standing hip abd/ext  with UE support x 10 x 2   01/20/2023 PT Evaluation, findings, HEP, prognosis, and frequency   PATIENT EDUCATION:  Education details: see below Person educated: Patient Education method: Explanation and Demonstration Education comprehension: verbalized understanding  HOME EXERCISE PROGRAM: Access Code: [email or web address] URL: [email or web address] 01/25/2023 - Supine Active Straight Leg Raise  - 1-2 x daily - 7 x weekly - 2 sets - 10 reps - Heel Toe Raises with Counter Support  - 1-2 x daily - 7 x weekly - 2 sets - 10 reps - Seated Long  Arc Quad  - 1-2 x daily - 7 x weekly - 2 sets - 10 reps - 3 hold - Standing Hip Extension with Counter Support  - 1-2 x daily - 7 x weekly - 2 sets - 10 reps - Standing Hip Abduction with Counter Support  - 1-2 x daily - 7 x weekly - 2 sets - 10 reps  Date: 01/20/2023 Prepared by: Starling Manns  Exercises - Supine Heel Slide  - 1 x daily - 7 x weekly - 3 sets - 10 reps - Seated Knee Flexion Extension AROM   - 1 x daily - 7 x weekly - 3 sets - 10 reps - Supine Gluteal Sets  - 1 x daily - 7 x weekly - 3 sets - 10 reps - Supine Knee Extension Stretch on Towel Roll  - 1 x daily - 7 x weekly - 3 sets - 10  reps  ASSESSMENT:  CLINICAL IMPRESSION: STM and manual PROM/AAROM continues to demonstrate tightness with knee flexion. Patient continues to work hard on knee flexion at home. Struggles to push through lots of pain on her own. Introduction of a modified Thomas stretch demonstrated improved passive knee flexion with patient comfort. The addition of LAQ between sets of passive stretching in [place] position allowed for more AROM in supine. Patient demonstrates forward chest lean and hip strategy during mini squats. Verbal cuing and demonstration improved form to a small degree. Patient understands importance of scar massage and active ROM during exercise. Patient will continue to benefit from skilled physical therapy to address ROM and strength deficits and promote optimal return to prior level of function.    OBJECTIVE IMPAIRMENTS: Abnormal gait, decreased activity tolerance, decreased balance, decreased mobility, difficulty walking, decreased ROM, decreased strength, hypomobility, increased fascial restrictions, impaired flexibility, and pain.   ACTIVITY LIMITATIONS: carrying, lifting, bending, sitting, standing, squatting, stairs, transfers, and locomotion level  PARTICIPATION LIMITATIONS: driving, shopping, community activity, occupation, and yard work  PERSONAL FACTORS: Age are also affecting  patient's functional outcome.   REHAB POTENTIAL: Good  CLINICAL DECISION MAKING: Stable/uncomplicated  EVALUATION COMPLEXITY: Low   GOALS: Goals reviewed with patient? No  SHORT TERM GOALS: Target date: 11/7/024  Pt will be independent with HEP in order to demonstrate participation in Physical Therapy POC.  Baseline: Goal status: INITIAL  2.  Pt will report 3/10 pain improvement during functional activity n order to demonstrate improved pain with functional activities.  Baseline:  Goal status: INITIAL  LONG TERM GOALS: Target date: 03/17/2023  Pt will improve 30 second chair test by > 2 reps in order to demonstrate improved functional strength to return to desired activities.  Baseline: 8x Goal status: INITIAL  2.  Pt will improve 2 MWT by >165ft with LRAD in order to demonstrate improved functional ambulatory capacity in community setting.  Baseline: 224ft with RW Goal status: INITIAL  3.  Pt will improve FOTO score by > 5 points in order to demonstrate improved pain with functional goals and outcomes. Baseline: 50.1 Goal status: INITIAL  4.  Pt will improve R knee extension ROM to 0 degrees in order to improve knee mobility during functional activities. Baseline: -5 degrees from 0 Goal status: INITIAL  5.  Pt will improve R knee flexion ROM to >110 degrees in order to improve knee mobility during functional activities. Baseline: 45 degrees currently Goal status: INITIAL    PLAN:   PT FREQUENCY: 2x/week  PT DURATION: 8 weeks  PLANNED INTERVENTIONS: 97146- PT Re-evaluation, 97110-Therapeutic exercises, 97530- Therapeutic activity, 97112- Neuromuscular re-education, 97535- Self Care, 84132- Manual therapy, [id number]- Gait training, Patient/Family education, Balance training, Stair training, and Joint mobilization  PLAN FOR NEXT SESSION: Continue POC and progress as tolerated with emphasis on R knee AROM & PROM (especially flexion), strength as well as hip strengthening.  Modification to squat with Add standing work on balance and gait to improve functional activities.    10:44 AM, 02/17/23 Karna Christmas, SPT   " I agree with the following treatment note after reviewing documentation. This session was performed under the supervision of a licensed clinician."    1:49 PM, 02/18/23 Amy Small Lynch MPT [place] [facility] [place] [phone]

## 2023-02-18 ENCOUNTER — Other Ambulatory Visit: Payer: Self-pay | Admitting: Surgical

## 2023-02-22 ENCOUNTER — Encounter (HOSPITAL_COMMUNITY): Payer: Self-pay

## 2023-02-22 ENCOUNTER — Ambulatory Visit (HOSPITAL_COMMUNITY): Payer: Medicare HMO

## 2023-02-22 DIAGNOSIS — Z7409 Other reduced mobility: Secondary | ICD-10-CM

## 2023-02-22 DIAGNOSIS — M1711 Unilateral primary osteoarthritis, right knee: Secondary | ICD-10-CM | POA: Diagnosis not present

## 2023-02-22 DIAGNOSIS — M25661 Stiffness of right knee, not elsewhere classified: Secondary | ICD-10-CM

## 2023-02-22 NOTE — Therapy (Signed)
OUTPATIENT PHYSICAL THERAPY LOWER EXTREMITY TREATMENT  Progress Note Reporting Period 10/10 to 02/22/23  See note below for Objective Data and Assessment of Progress/Goals.     Patient Name: Victoria Reyes MRN: [id number] DOB:October 28, 1950, 72 y.o., female Today's Date: 02/22/2023  END OF SESSION:  PT End of Session - 02/22/23 1041     Visit Number 10    Number of Visits 12    Date for PT Re-Evaluation 03/17/23    Authorization Type [employer] medicare (12 visits approved)    Authorization Time Period 01/20/23-03/17/23    Authorization - Number of Visits 12    Progress Note Due on Visit 10    PT Start Time [phone]    PT Stop Time 1030    PT Time Calculation (min) 43 min              Past Medical History:  Diagnosis Date   Anemia    as a  young woman   Diverticulitis 2005   CT    DJD (degenerative joint disease)    GERD (gastroesophageal reflux disease)    H. pylori infection 01/2020   S/p treatment with omeprazole, amoxicillin, and Biaxin.   Headache(784.0)    Hyperlipidemia    Hypothyroidism    Knee pain, right    Low back pain    Neuropathy    peroneal nerve   Neuropathy    left lower leg from back surgery per patient   Obesity    Pneumonia    as child   S/P colonoscopy 2005   Dr. Katrinka Blazing: sigmoid diverticulosis   Thyroid disease    Tobacco abuse    Past Surgical History:  Procedure Laterality Date   BACK SURGERY     BIOPSY  01/29/2020   Procedure: BIOPSY;  Surgeon: Dolores Frame, MD;  Location: [facility];  Service: Gastroenterology;;   BIOPSY  05/13/2020   Procedure: BIOPSY;  Surgeon: Lanelle Bal, DO;  Location: [facility] ENDOSCOPY;  Service: Endoscopy;;   COLONOSCOPY  12/18/2010   Procedure: COLONOSCOPY;  Surgeon: Arlyce Harman, MD; 3 mm sessile polyp at hepatic flexure, pancolonic diverticulosis, internal hemorrhoids, anal lesion just above the dentate line biopsied via cold forceps.  Colonic polyp was a tubular adenoma.  Anus biopsy with  mild active inflammation associated with reactive epithelial changes and hemorrhage.  Recommended repeat colonoscopy in 2022.   COLONOSCOPY WITH PROPOFOL N/A 05/13/2020   Procedure: COLONOSCOPY WITH PROPOFOL;  Surgeon: Lanelle Bal, DO;  Location: [facility] ENDOSCOPY;  Service: Endoscopy;  Laterality: N/A;  12:30PM   cyst removal right wrist     ESOPHAGOGASTRODUODENOSCOPY (EGD) WITH PROPOFOL N/A 01/29/2020   Procedure: ESOPHAGOGASTRODUODENOSCOPY (EGD) WITH PROPOFOL;  Surgeon: Marguerita Merles, Reuel Boom, MD;   3 gastric ulcers with clean base, 5 ulcers in the duodenal bulb and first portion of duodenum, 2 were noted to be oozing, treated with bipolar cautery.  Gastric biopsies consistent with H. pylori gastritis.   ESOPHAGOGASTRODUODENOSCOPY (EGD) WITH PROPOFOL N/A 05/13/2020   Procedure: ESOPHAGOGASTRODUODENOSCOPY (EGD) WITH PROPOFOL;  Surgeon: Lanelle Bal, DO;  Location: [facility] ENDOSCOPY;  Service: Endoscopy;  Laterality: N/A;   EYE SURGERY Bilateral    cataract surgery with lens implant   POLYPECTOMY  05/13/2020   Procedure: POLYPECTOMY;  Surgeon: Lanelle Bal, DO;  Location: [facility] ENDOSCOPY;  Service: Endoscopy;;   TONSILLECTOMY     TOTAL KNEE ARTHROPLASTY Right 12/28/2022   Procedure: TOTAL KNEE ARTHROPLASTY;  Surgeon: Cammy Copa, MD;  Location: [facility] OR;  Service: Orthopedics;  Laterality: Right;   TOTAL SHOULDER ARTHROPLASTY Left 12/21/2016   Procedure: TOTAL SHOULDER ARTHROPLASTY;  Surgeon: Cammy Copa, MD;  Location: [facility] OR;  Service: Orthopedics;  Laterality: Left;   TUBAL LIGATION     umbillical hernia     Patient Active Problem List   Diagnosis Date Noted   OA (osteoarthritis) of knee 12/28/2022   S/P total knee arthroplasty, right 12/28/2022   Low vitamin D level 11/01/2022   Leg swelling 06/28/2022   Giardiasis 08/23/2021   Tapeworm infection 08/23/2021   Leg pain, left 11/17/2020   Alopecia 05/06/2020   History of Helicobacter pylori infection 04/09/2020   Anemia  04/09/2020   History of adenomatous polyp of colon 04/09/2020   Gastric ulcer 01/29/2020   Duodenal ulcer 01/29/2020   Sacro ilial pain 11/08/2019   Chronic back pain 09/11/2019   Immunization refused 03/11/2019   Encounter for annual physical exam 07/10/2015   Cutaneous wart 09/30/2014   Hyperlipidemia with target LDL less than 100 05/04/2012   GERD (gastroesophageal reflux disease) 03/14/2011   Chronic right-sided low back pain with right-sided sciatica 06/25/2009   Morbid obesity (HCC) 02/17/2007   Hypothyroidism 06/14/2006   Diverticulosis of colon 03/01/2006   HIATAL HERNIA WITH REFLUX, HX OF 03/01/2006    PCP: Syliva Overman MD  REFERRING PROVIDER:   Cammy Copa, MD    REFERRING DIAG: M17.11 (ICD-10-CM) - Unilateral primary osteoarthritis, right knee   THERAPY DIAG:  No diagnosis found.  Rationale for Evaluation and Treatment: Rehabilitation  ONSET DATE: 12/28/22  SUBJECTIVE:   SUBJECTIVE STATEMENT: Pt arrived without AD.  Pt reports increased swelling today.  Has been compliant with exercises.  Has been using CPM daily and has been working on her scar tissue massage.  Stated her CPM machine is set at 90 degrees.  EVAL: Coming in for R TKA. 17th of this Month. Pt received home health PT. Pt reports getting around the house just fine, badges are starting to come off. Pt lives with daughter/son and grandchildren. Before the knee replacement, pt was independent with functional mobility and ADLs. Pt currently utilizing AD for ambulation, not driving currently. Assist from daugther for transfers into tub. Pt known to this clinc with prior TSA.   PERTINENT HISTORY: Lumbar fusion PAIN:  Are you having pain? Yes: NPRS scale: 5/10 Pain location: R knee Pain description: dull pain Aggravating factors: nothing Relieving factors: pain medications, ice  PRECAUTIONS: None  RED FLAGS: None   WEIGHT BEARING RESTRICTIONS: No  FALLS:  Has patient fallen in last  6 months? No  LIVING ENVIRONMENT: Lives with: lives with their family Lives in: House/apartment Stairs: Yes: External: 4 steps; can reach both Has following equipment at home: Dan Humphreys - 2 wheeled  OCCUPATION: Retired  PLOF: Independent  PATIENT GOALS: "go get grand-baby at school"  NEXT MD VISIT: 02/07/23  OBJECTIVE:  Note: Objective measures were completed at Evaluation unless otherwise noted.  DIAGNOSTIC FINDINGS:   PATIENT SURVEYS:  FOTO 50.1%  COGNITION: Overall cognitive status: Within functional limits for tasks assessed     SENSATION: WFL  POSTURE: No Significant postural limitations  PALPATION: Not tender to Palpation  LOWER EXTREMITY ROM:  Active ROM Right eval Left eval Right 02/01/23 Right 02/10/23 Right 02/22/23  Hip flexion       Hip extension       Hip abduction       Hip adduction       Hip internal rotation       Hip external  rotation       Knee flexion 37 110 60, 63 AAROM 75 77  Knee extension -5 0  -3 (lacking) -3 lacking  Ankle dorsiflexion       Ankle plantarflexion       Ankle inversion       Ankle eversion        (Blank rows = not tested)  LOWER EXTREMITY MMT:  MMT Right eval Left eval Right 02/22/23  Hip flexion   4  Hip extension   2+  Hip abduction   4  Hip adduction     Hip internal rotation     Hip external rotation     Knee flexion   2+ limited range  Knee extension  4- 4-  Ankle dorsiflexion     Ankle plantarflexion     Ankle inversion     Ankle eversion      (Blank rows = not tested)  FUNCTIONAL TESTS:  30 seconds chair stand test: 8x 2 minute walk test: 274ft w/ RW  02/22/23: no AD: 386 30 seconds chair stand test: 13x  GAIT: Distance walked: 263ft Assistive device utilized: Walker - 2 wheeled Level of assistance: Modified independence Comments: Mild R circumduction during swing, limited R knee flexion during swing. Mild antalgic gait pattern with RLE   TODAY'S TREATMENT:                                                                                                                               DATE:  02/22/23: Seated: knee hang with AAROM 5x 20" FOTO: 76% STM and retro grade massage for edema control x with LE elevated MMT See above Prone: Quad stretch with rope 3x 30" : 386 30 seconds chair stand test: 13x Knee drive on 2nd step (14NW) AROM 3-77 degrees  02/17/2023 STM and man ROM to right knee in supine and sitting with contract relax x15' to improve soft tissue extensibility and right knee mobility Supine SLR 2 x until fatigue SAQ 2 x until fatigue  Modified thomas stretch 2x30 sec paired with x15 LAQ between sets Mini squats to elevated mat 3x15    02/15/23 STM and man ROM to right knee in supine and sitting with contract relax x15' to improve soft tissue extensibility and right knee mobility Heel slides AAROM with strap 3' Squats to elevated mat 2x15;  Heel raises 2x15 with hand support Education about exercise modification, scar massage, and mobility at home  Measured knee flexion in supine at EOS. 80 AROM, 83 AAROM   02/10/2023 STM and man ROM to right knee in supine and sitting with contract relax x 10' to improve soft tissue extensibility and right knee mobility Standing: 12" box Right knee drives for flexion x 2' Heel raises 2 x 10 Squats 2 x 10 Nustep seat 8 arms 10 for mobility x 5' AROM right knee in supine -3 to 75 Seated heel slides x 5 with overpressure  from left leg   02/08/23 Manual in supine. Retrograde massage over quads, hamstrings, and calf. Patella mobs all directions. Tender to palpation over later joint line and medial hamstring insertion.  Supine:  Quad sets 10x5"  TKE 15 x 5" on half bolster (Short arc quad)  SLR with quad set prior raise 10x  Heel Slides x 10 with rope  PROM: knee ext: -3 degrees Standing:  Knee drive for flexion on 8in step height 5x 20"  Step ups on 4in step height 20x Step navigation (up 8in and  down 4in) Gait training with and without [facility]   02/03/23: Gait training with SPC, cueing for UE and sequence with initial 3 point step to to 2 point step thru 2RT inside // bars for heel strike, swing through, toe push off, knee flexion  Manual:  Supine with LE elevated  Retrograde massage  Patella mobs all directions Supine:  Quad sets 10x 5"  TKE 15 x 5" on half bolster  SLR with quad set prior raise 10x  Heel slide 10x 5-10" holds at end range   AROM 4- 68 degrees Seated: AAROM heel slide 10x 10" Standing:  Knee drive for flexion on 8in step height 5x 20" Bike seat 10 rocking x 5'  02/01/23 Seated: Leg dangle for flexion Calf/hamstring stretch with strap 2 x 30" Supine: STM to right knee to increase soft tissue mobility and joint ROM x 10' AROM right knee flexion 60 degrees; 63 AAROM SAQ x 10 SAQ into SLR  x 10  Standing: Knee drives for flexion x 2' on 7" step Heel toe raises x 10   01/27/2023  -Seated knee flexion active stretch 5 x 30'' hold- cues for reduced trunk lean compensation. -Heel slides with a strap x 10 x 3 with 30" hold on flexion  -Supine R SLR x 15 with tactile cues at distal foot for improved knee extesion -Seated LAQ 10 x 3  -Standing weight shifts x 10 bilaterally with symmetrical foot placement tactile cues at hips within Rw.  -Elevated sit/stands with even foot placement and no UE support 2 x 10- 6/10 pain. Cues for symmetry.   01/25/23 NuStep, seat 10 , arm 8, level 1 x 5' Heel slides with a strap x 10 x 3 with 30" hold on flexion on the last rep Supine R SLR x 10 x 2 R LAQ x 3" x 10 x 2 Heel/toe raises with UE support x 10 x 2 Standing hip abd/ext  with UE support x 10 x 2   01/20/2023 PT Evaluation, findings, HEP, prognosis, and frequency   PATIENT EDUCATION:  Education details: see below Person educated: Patient Education method: Explanation and Demonstration Education comprehension: verbalized understanding  HOME EXERCISE  PROGRAM: Access Code: [email or web address] URL: [email or web address] 01/25/2023 - Supine Active Straight Leg Raise  - 1-2 x daily - 7 x weekly - 2 sets - 10 reps - Heel Toe Raises with Counter Support  - 1-2 x daily - 7 x weekly - 2 sets - 10 reps - Seated Long Arc Quad  - 1-2 x daily - 7 x weekly - 2 sets - 10 reps - 3 hold - Standing Hip Extension with Counter Support  - 1-2 x daily - 7 x weekly - 2 sets - 10 reps - Standing Hip Abduction with Counter Support  - 1-2 x daily - 7 x weekly - 2 sets - 10 reps  Date: 01/20/2023 Prepared by: Starling Manns  Exercises - Supine Heel Slide  -  1 x daily - 7 x weekly - 3 sets - 10 reps - Seated Knee Flexion Extension AROM   - 1 x daily - 7 x weekly - 3 sets - 10 reps - Supine Gluteal Sets  - 1 x daily - 7 x weekly - 3 sets - 10 reps - Supine Knee Extension Stretch on Towel Roll  - 1 x daily - 7 x weekly - 3 sets - 10 reps  ASSESSMENT:  CLINICAL IMPRESSION: 10th visit progress note with the following finding:  Pt has met 1/2 STG and 3/5 LTGs.  Pt currently ambulating with no AD, increased cadence with min cueing for heel to toe mechanics during .  Strength assessed this session, extreme weakness with hamstring and gluteal mm.  Pt presents with improved self perceived functional abilities noted with FOTO score.  Pt limited with knee ROM.  Encouraged to increase compliance with active ROM based exercises and to increase her CPM to 110.  Added quad stretch.  Pt will continue to benefit from skilled intervention to address goals unmet.        OBJECTIVE IMPAIRMENTS: Abnormal gait, decreased activity tolerance, decreased balance, decreased mobility, difficulty walking, decreased ROM, decreased strength, hypomobility, increased fascial restrictions, impaired flexibility, and pain.   ACTIVITY LIMITATIONS: carrying, lifting, bending, sitting, standing, squatting, stairs, transfers, and locomotion level  PARTICIPATION LIMITATIONS: driving,  shopping, community activity, occupation, and yard work  PERSONAL FACTORS: Age are also affecting patient's functional outcome.   REHAB POTENTIAL: Good  CLINICAL DECISION MAKING: Stable/uncomplicated  EVALUATION COMPLEXITY: Low   GOALS: Goals reviewed with patient? No  SHORT TERM GOALS: Target date: 11/7/024  Pt will be independent with HEP in order to demonstrate participation in Physical Therapy POC.  Baseline:  Reports compliance with HEP Goal status: MET  2.  Pt will report 3/10 pain improvement during functional activity n order to demonstrate improved pain with functional activities.  Baseline: Reports increased pain with stretches 7/10 Goal status: IN PROGRESS  LONG TERM GOALS: Target date: 03/17/2023  Pt will improve 30 second chair test by > 2 reps in order to demonstrate improved functional strength to return to desired activities.  Baseline: 8x; 02/22/23: 13x no HHA Goal status: MET  2.  Pt will improve 2 MWT by >123ft with LRAD in order to demonstrate improved functional ambulatory capacity in community setting.  Baseline: 235ft with RW; 02/22/23: 386 ft no AD Goal status: MET  3.  Pt will improve FOTO score by > 5 points in order to demonstrate improved pain with functional goals and outcomes. Baseline: 50.1 02/22/23: 76% Goal status: MET  4.  Pt will improve R knee extension ROM to 0 degrees in order to improve knee mobility during functional activities. Baseline: -5 degrees from 0; 02/22/23: AROM 3-77 degrees Goal status: IN PROGRESS  5.  Pt will improve R knee flexion ROM to >110 degrees in order to improve knee mobility during functional activities. Baseline: 45 degrees currently; 02/22/23: AROM 3-77 degrees Goal status: IN PROGRESS    PLAN:   PT FREQUENCY: 2x/week  PT DURATION: 8 weeks  PLANNED INTERVENTIONS: 97146- PT Re-evaluation, 97110-Therapeutic exercises, 97530- Therapeutic activity, 97112- Neuromuscular re-education, 97535- Self Care,  97140- Manual therapy, [phone]- Gait training, Patient/Family education, Balance training, Stair training, and Joint mobilization  PLAN FOR NEXT SESSION: Continue POC and progress as tolerated with emphasis on R knee AROM & PROM (especially flexion), strength as well as hip strengthening. Modification to squat with Add standing work on balance and  gait to improve functional activities.  Add PROM, contract relax, prolonged quad stretch.  Bridges to HEP for gluteal strengthening.     1:05 PM, 02/22/23    Becky Sax, LPTA/CLT; CBIS [phone] Juel Burrow, PTA 02/22/2023, 1:05 PM  1:05 PM, 02/22/23

## 2023-02-24 ENCOUNTER — Ambulatory Visit (HOSPITAL_COMMUNITY): Payer: Medicare HMO

## 2023-02-24 DIAGNOSIS — Z7409 Other reduced mobility: Secondary | ICD-10-CM

## 2023-02-24 DIAGNOSIS — M1711 Unilateral primary osteoarthritis, right knee: Secondary | ICD-10-CM

## 2023-02-24 DIAGNOSIS — M25661 Stiffness of right knee, not elsewhere classified: Secondary | ICD-10-CM | POA: Diagnosis not present

## 2023-02-24 NOTE — Therapy (Signed)
OUTPATIENT PHYSICAL THERAPY LOWER EXTREMITY TREATMENT  Progress Note Reporting Period 10/10 to 02/22/23  See note below for Objective Data and Assessment of Progress/Goals.     Patient Name: Victoria Reyes MRN: [id number] DOB:[birth year], 72 y.o., female Today's Date: 02/24/2023  END OF SESSION:  PT End of Session - 02/24/23 0941     Visit Number 11    Number of Visits 12    Date for PT Re-Evaluation 03/17/23    Authorization Type [employer] medicare (12 visits approved)    Authorization Time Period 01/20/23-03/17/23    Authorization - Visit Number 6    Authorization - Number of Visits 12    Progress Note Due on Visit 10    PT Start Time 0935    PT Stop Time 1015    PT Time Calculation (min) 40 min    Activity Tolerance Patient tolerated treatment well    Behavior During Therapy [facility] for tasks assessed/performed              Past Medical History:  Diagnosis Date   Anemia    as a  young woman   Diverticulitis 2005   CT    DJD (degenerative joint disease)    GERD (gastroesophageal reflux disease)    H. pylori infection 01/2020   S/p treatment with omeprazole, amoxicillin, and Biaxin.   Headache(784.0)    Hyperlipidemia    Hypothyroidism    Knee pain, right    Low back pain    Neuropathy    peroneal nerve   Neuropathy    left lower leg from back surgery per patient   Obesity    Pneumonia    as child   S/P colonoscopy 2005   Dr. Katrinka Blazing: sigmoid diverticulosis   Thyroid disease    Tobacco abuse    Past Surgical History:  Procedure Laterality Date   BACK SURGERY     BIOPSY  01/29/2020   Procedure: BIOPSY;  Surgeon: Dolores Frame, MD;  Location: [facility];  Service: Gastroenterology;;   BIOPSY  05/13/2020   Procedure: BIOPSY;  Surgeon: Lanelle Bal, DO;  Location: [facility] ENDOSCOPY;  Service: Endoscopy;;   COLONOSCOPY  12/18/2010   Procedure: COLONOSCOPY;  Surgeon: Arlyce Harman, MD; 3 mm sessile polyp at hepatic flexure, pancolonic  diverticulosis, internal hemorrhoids, anal lesion just above the dentate line biopsied via cold forceps.  Colonic polyp was a tubular adenoma.  Anus biopsy with mild active inflammation associated with reactive epithelial changes and hemorrhage.  Recommended repeat colonoscopy in 2022.   COLONOSCOPY WITH PROPOFOL N/A 05/13/2020   Procedure: COLONOSCOPY WITH PROPOFOL;  Surgeon: Lanelle Bal, DO;  Location: [facility] ENDOSCOPY;  Service: Endoscopy;  Laterality: N/A;  12:30PM   cyst removal right wrist     ESOPHAGOGASTRODUODENOSCOPY (EGD) WITH PROPOFOL N/A 01/29/2020   Procedure: ESOPHAGOGASTRODUODENOSCOPY (EGD) WITH PROPOFOL;  Surgeon: Marguerita Merles, Reuel Boom, MD;   3 gastric ulcers with clean base, 5 ulcers in the duodenal bulb and first portion of duodenum, 2 were noted to be oozing, treated with bipolar cautery.  Gastric biopsies consistent with H. pylori gastritis.   ESOPHAGOGASTRODUODENOSCOPY (EGD) WITH PROPOFOL N/A 05/13/2020   Procedure: ESOPHAGOGASTRODUODENOSCOPY (EGD) WITH PROPOFOL;  Surgeon: Lanelle Bal, DO;  Location: [facility] ENDOSCOPY;  Service: Endoscopy;  Laterality: N/A;   EYE SURGERY Bilateral    cataract surgery with lens implant   POLYPECTOMY  05/13/2020   Procedure: POLYPECTOMY;  Surgeon: Lanelle Bal, DO;  Location: [facility] ENDOSCOPY;  Service: Endoscopy;;   TONSILLECTOMY  TOTAL KNEE ARTHROPLASTY Right 12/28/2022   Procedure: TOTAL KNEE ARTHROPLASTY;  Surgeon: Cammy Copa, MD;  Location: [facility] OR;  Service: Orthopedics;  Laterality: Right;   TOTAL SHOULDER ARTHROPLASTY Left 12/21/2016   Procedure: TOTAL SHOULDER ARTHROPLASTY;  Surgeon: Cammy Copa, MD;  Location: [facility] OR;  Service: Orthopedics;  Laterality: Left;   TUBAL LIGATION     umbillical hernia     Patient Active Problem List   Diagnosis Date Noted   OA (osteoarthritis) of knee 12/28/2022   S/P total knee arthroplasty, right 12/28/2022   Low vitamin D level 11/01/2022   Leg swelling 06/28/2022   Giardiasis  08/23/2021   Tapeworm infection 08/23/2021   Leg pain, left 11/17/2020   Alopecia 05/06/2020   History of Helicobacter pylori infection 04/09/2020   Anemia 04/09/2020   History of adenomatous polyp of colon 04/09/2020   Gastric ulcer 01/29/2020   Duodenal ulcer 01/29/2020   Sacro ilial pain 11/08/2019   Chronic back pain 09/11/2019   Immunization refused 03/11/2019   Encounter for annual physical exam 07/10/2015   Cutaneous wart 09/30/2014   Hyperlipidemia with target LDL less than 100 05/04/2012   GERD (gastroesophageal reflux disease) 03/14/2011   Chronic right-sided low back pain with right-sided sciatica 06/25/2009   Morbid obesity (HCC) 02/17/2007   Hypothyroidism 06/14/2006   Diverticulosis of colon 03/01/2006   HIATAL HERNIA WITH REFLUX, HX OF 03/01/2006    PCP: Syliva Overman MD  REFERRING PROVIDER:   Cammy Copa, MD    REFERRING DIAG: M17.11 (ICD-10-CM) - Unilateral primary osteoarthritis, right knee   THERAPY DIAG:  Decreased range of motion (ROM) of right knee  Arthritis of right knee  Impaired functional mobility, balance, gait, and endurance  Rationale for Evaluation and Treatment: Rehabilitation  ONSET DATE: 12/28/22  SUBJECTIVE:   SUBJECTIVE STATEMENT: Soreness right knee; reports working on mobility at home; sees MD tomorrow  EVAL: Coming in for R TKA. 17th of this Month. Pt received home health PT. Pt reports getting around the house just fine, badges are starting to come off. Pt lives with daughter/son and grandchildren. Before the knee replacement, pt was independent with functional mobility and ADLs. Pt currently utilizing AD for ambulation, not driving currently. Assist from daugther for transfers into tub. Pt known to this clinc with prior TSA.   PERTINENT HISTORY: Lumbar fusion PAIN:  Are you having pain? Yes: NPRS scale: 5/10 Pain location: R knee Pain description: dull pain Aggravating factors: nothing Relieving factors: pain  medications, ice  PRECAUTIONS: None  RED FLAGS: None   WEIGHT BEARING RESTRICTIONS: No  FALLS:  Has patient fallen in last 6 months? No  LIVING ENVIRONMENT: Lives with: lives with their family Lives in: House/apartment Stairs: Yes: External: 4 steps; can reach both Has following equipment at home: Dan Humphreys - 2 wheeled  OCCUPATION: Retired  PLOF: Independent  PATIENT GOALS: "go get grand-baby at school"  NEXT MD VISIT: 02/07/23  OBJECTIVE:  Note: Objective measures were completed at Evaluation unless otherwise noted.  DIAGNOSTIC FINDINGS:   PATIENT SURVEYS:  FOTO 50.1%  COGNITION: Overall cognitive status: Within functional limits for tasks assessed     SENSATION: WFL  POSTURE: No Significant postural limitations  PALPATION: Not tender to Palpation  LOWER EXTREMITY ROM:  Active ROM Right eval Left eval Right 02/01/23 Right 02/10/23 Right 02/22/23  Hip flexion       Hip extension       Hip abduction       Hip adduction  Hip internal rotation       Hip external rotation       Knee flexion 37 110 60, 63 AAROM 75 77  Knee extension -5 0  -3 (lacking) -3 lacking  Ankle dorsiflexion       Ankle plantarflexion       Ankle inversion       Ankle eversion        (Blank rows = not tested)  LOWER EXTREMITY MMT:  MMT Right eval Left eval Right 02/22/23  Hip flexion   4  Hip extension   2+  Hip abduction   4  Hip adduction     Hip internal rotation     Hip external rotation     Knee flexion   2+ limited range  Knee extension  4- 4-  Ankle dorsiflexion     Ankle plantarflexion     Ankle inversion     Ankle eversion      (Blank rows = not tested)  FUNCTIONAL TESTS:  30 seconds chair stand test: 8x 2 minute walk test: 227ft w/ RW  02/22/23: no AD: 386 30 seconds chair stand test: 13x  GAIT: Distance walked: 240ft Assistive device utilized: Walker - 2 wheeled Level of assistance: Modified independence Comments: Mild R  circumduction during swing, limited R knee flexion during swing. Mild antalgic gait pattern with RLE   TODAY'S TREATMENT:                                                                                                                              DATE:  02/24/2023 Supine: STM and manual ROM right knee for 25' for right knee flexion and extension; seated contract relax for quads and hamstrings Supine -2 to 78; sitting knee flexion AAROM 82 degrees Standing: Knee drives on 7" step for flexion  2' Bike seat 8 x 3' for knee ROM half revolutions  02/22/23: Seated: knee hang with AAROM 5x 20" FOTO: 76% STM and retro grade massage for edema control x with LE elevated MMT See above Prone: Quad stretch with rope 3x 30" : 386 30 seconds chair stand test: 13x Knee drive on 2nd step (69GE) AROM 3-77 degrees  02/17/2023 STM and man ROM to right knee in supine and sitting with contract relax x15' to improve soft tissue extensibility and right knee mobility Supine SLR 2 x until fatigue SAQ 2 x until fatigue  Modified thomas stretch 2x30 sec paired with x15 LAQ between sets Mini squats to elevated mat 3x15    02/15/23 STM and man ROM to right knee in supine and sitting with contract relax x15' to improve soft tissue extensibility and right knee mobility Heel slides AAROM with strap 3' Squats to elevated mat 2x15;  Heel raises 2x15 with hand support Education about exercise modification, scar massage, and mobility at home  Measured knee flexion in supine at EOS. 80 AROM, 83 AAROM   02/10/2023 STM and man  ROM to right knee in supine and sitting with contract relax x 10' to improve soft tissue extensibility and right knee mobility Standing: 12" box Right knee drives for flexion x 2' Heel raises 2 x 10 Squats 2 x 10 Nustep seat 8 arms 10 for mobility x 5' AROM right knee in supine -3 to 75 Seated heel slides x 5 with overpressure from left leg   02/08/23 Manual in supine.  Retrograde massage over quads, hamstrings, and calf. Patella mobs all directions. Tender to palpation over later joint line and medial hamstring insertion.  Supine:  Quad sets 10x5"  TKE 15 x 5" on half bolster (Short arc quad)  SLR with quad set prior raise 10x  Heel Slides x 10 with rope  PROM: knee ext: -3 degrees Standing:  Knee drive for flexion on 8in step height 5x 20"  Step ups on 4in step height 20x Step navigation (up 8in and down 4in) Gait training with and without [facility]   02/03/23: Gait training with SPC, cueing for UE and sequence with initial 3 point step to to 2 point step thru 2RT inside // bars for heel strike, swing through, toe push off, knee flexion  Manual:  Supine with LE elevated  Retrograde massage  Patella mobs all directions Supine:  Quad sets 10x 5"  TKE 15 x 5" on half bolster  SLR with quad set prior raise 10x  Heel slide 10x 5-10" holds at end range   AROM 4- 68 degrees Seated: AAROM heel slide 10x 10" Standing:  Knee drive for flexion on 8in step height 5x 20" Bike seat 10 rocking x 5'  02/01/23 Seated: Leg dangle for flexion Calf/hamstring stretch with strap 2 x 30" Supine: STM to right knee to increase soft tissue mobility and joint ROM x 10' AROM right knee flexion 60 degrees; 63 AAROM SAQ x 10 SAQ into SLR  x 10  Standing: Knee drives for flexion x 2' on 7" step Heel toe raises x 10   01/27/2023  -Seated knee flexion active stretch 5 x 30'' hold- cues for reduced trunk lean compensation. -Heel slides with a strap x 10 x 3 with 30" hold on flexion  -Supine R SLR x 15 with tactile cues at distal foot for improved knee extesion -Seated LAQ 10 x 3  -Standing weight shifts x 10 bilaterally with symmetrical foot placement tactile cues at hips within Rw.  -Elevated sit/stands with even foot placement and no UE support 2 x 10- 6/10 pain. Cues for symmetry.   01/25/23 NuStep, seat 10 , arm 8, level 1 x 5' Heel slides with a strap x 10 x  3 with 30" hold on flexion on the last rep Supine R SLR x 10 x 2 R LAQ x 3" x 10 x 2 Heel/toe raises with UE support x 10 x 2 Standing hip abd/ext  with UE support x 10 x 2   01/20/2023 PT Evaluation, findings, HEP, prognosis, and frequency   PATIENT EDUCATION:  Education details: see below Person educated: Patient Education method: Explanation and Demonstration Education comprehension: verbalized understanding  HOME EXERCISE PROGRAM: Access Code: [email or web address] URL: [email or web address] 01/25/2023 - Supine Active Straight Leg Raise  - 1-2 x daily - 7 x weekly - 2 sets - 10 reps - Heel Toe Raises with Counter Support  - 1-2 x daily - 7 x weekly - 2 sets - 10 reps - Seated Long Arc Quad  - 1-2 x daily - 7 x weekly - 2  sets - 10 reps - 3 hold - Standing Hip Extension with Counter Support  - 1-2 x daily - 7 x weekly - 2 sets - 10 reps - Standing Hip Abduction with Counter Support  - 1-2 x daily - 7 x weekly - 2 sets - 10 reps  Date: 01/20/2023 Prepared by: Starling Manns  Exercises - Supine Heel Slide  - 1 x daily - 7 x weekly - 3 sets - 10 reps - Seated Knee Flexion Extension AROM   - 1 x daily - 7 x weekly - 3 sets - 10 reps - Supine Gluteal Sets  - 1 x daily - 7 x weekly - 3 sets - 10 reps - Supine Knee Extension Stretch on Towel Roll  - 1 x daily - 7 x weekly - 3 sets - 10 reps  ASSESSMENT:  CLINICAL IMPRESSION: 10th visit progress note with the following finding:  Pt has met 1/2 STG and 3/5 LTGs.  Pt currently ambulating with no AD, increased cadence with min cueing for heel to toe mechanics during .  Strength assessed this session, extreme weakness with hamstring and gluteal mm.  Pt presents with improved self perceived functional abilities noted with FOTO score.  Pt limited with knee ROM.  Encouraged to increase compliance with active ROM based exercises and to increase her CPM to 110.  Added quad stretch.  Pt will continue to benefit from skilled intervention  to address goals unmet.        OBJECTIVE IMPAIRMENTS: Abnormal gait, decreased activity tolerance, decreased balance, decreased mobility, difficulty walking, decreased ROM, decreased strength, hypomobility, increased fascial restrictions, impaired flexibility, and pain.   ACTIVITY LIMITATIONS: carrying, lifting, bending, sitting, standing, squatting, stairs, transfers, and locomotion level  PARTICIPATION LIMITATIONS: driving, shopping, community activity, occupation, and yard work  PERSONAL FACTORS: Age are also affecting patient's functional outcome.   REHAB POTENTIAL: Good  CLINICAL DECISION MAKING: Stable/uncomplicated  EVALUATION COMPLEXITY: Low   GOALS: Goals reviewed with patient? No  SHORT TERM GOALS: Target date: 11/7/024  Pt will be independent with HEP in order to demonstrate participation in Physical Therapy POC.  Baseline:  Reports compliance with HEP Goal status: MET  2.  Pt will report 3/10 pain improvement during functional activity n order to demonstrate improved pain with functional activities.  Baseline: Reports increased pain with stretches 7/10 Goal status: IN PROGRESS  LONG TERM GOALS: Target date: 03/17/2023  Pt will improve 30 second chair test by > 2 reps in order to demonstrate improved functional strength to return to desired activities.  Baseline: 8x; 02/22/23: 13x no HHA Goal status: MET  2.  Pt will improve 2 MWT by >193ft with LRAD in order to demonstrate improved functional ambulatory capacity in community setting.  Baseline: 224ft with RW; 02/22/23: 386 ft no AD Goal status: MET  3.  Pt will improve FOTO score by > 5 points in order to demonstrate improved pain with functional goals and outcomes. Baseline: 50.1 02/22/23: 76% Goal status: MET  4.  Pt will improve R knee extension ROM to 0 degrees in order to improve knee mobility during functional activities. Baseline: -5 degrees from 0; 02/22/23: AROM 3-77 degrees Goal status: IN  PROGRESS  5.  Pt will improve R knee flexion ROM to >110 degrees in order to improve knee mobility during functional activities. Baseline: 45 degrees currently; 02/22/23: AROM 3-77 degrees Goal status: IN PROGRESS    PLAN:   PT FREQUENCY: 2x/week  PT DURATION: 8 weeks  PLANNED INTERVENTIONS:  52841- PT Re-evaluation, 97110-Therapeutic exercises, 97530- Therapeutic activity, [id number]- Neuromuscular re-education, 97535- Self Care, [id number]- Manual therapy, [phone]- Gait training, Patient/Family education, Balance training, Stair training, and Joint mobilization  PLAN FOR NEXT SESSION: Continue POC and progress as tolerated with emphasis on R knee AROM & PROM (especially flexion), strength as well as hip strengthening. Modification to squat with Add standing work on balance and gait to improve functional activities.  Add PROM, contract relax, prolonged quad stretch.  Bridges to HEP for gluteal strengthening.     10:19 AM, 02/24/23 Minela [place] Small Kele Barthelemy MPT [facility] [place] [phone]

## 2023-02-25 ENCOUNTER — Ambulatory Visit (INDEPENDENT_AMBULATORY_CARE_PROVIDER_SITE_OTHER): Payer: Medicare HMO | Admitting: Orthopedic Surgery

## 2023-02-25 DIAGNOSIS — Z96651 Presence of right artificial knee joint: Secondary | ICD-10-CM

## 2023-02-26 ENCOUNTER — Encounter: Payer: Self-pay | Admitting: Orthopedic Surgery

## 2023-02-26 NOTE — Progress Notes (Signed)
Post-Op Visit Note   Patient: Victoria Reyes           Date of Birth: [birth year]           MRN: [id number] Visit Date: 02/25/2023 PCP: Kerri Perches, MD   Assessment & Plan:  Chief Complaint:  Chief Complaint  Patient presents with   Right Knee - Routine Post Op    RIGHT TKA (surgery date 12-28-22)   Visit Diagnoses:  1. S/P total knee arthroplasty, right     Plan: Patient presents now almost 2 months out right total knee replacement.  She states she is doing better.  Walking is improved.  Does still report some pain.  Doing physical therapy with home exercise.  Decision point today was for or against manipulation.  On exam she has range of motion of about 0-80.  In general Victoria Reyes is happy with her range of motion.  I think she is functional with that motion.  If she wants to get more motion in terms of flexion she is going to have to redouble her efforts in therapy as well as with her home exercise program but in general she is fairly content with where she is at right now.  4-week return for final clinical check.  Follow-Up Instructions: No follow-ups on file.   Orders:  No orders of the defined types were placed in this encounter.  No orders of the defined types were placed in this encounter.   Imaging: No results found.  PMFS History: Patient Active Problem List   Diagnosis Date Noted   OA (osteoarthritis) of knee 12/28/2022   S/P total knee arthroplasty, right 12/28/2022   Low vitamin D level 11/01/2022   Leg swelling 06/28/2022   Giardiasis 08/23/2021   Tapeworm infection 08/23/2021   Leg pain, left 11/17/2020   Alopecia 05/06/2020   History of Helicobacter pylori infection 04/09/2020   Anemia 04/09/2020   History of adenomatous polyp of colon 04/09/2020   Gastric ulcer 01/29/2020   Duodenal ulcer 01/29/2020   Sacro ilial pain 11/08/2019   Chronic back pain 09/11/2019   Immunization refused 03/11/2019   Encounter for annual physical exam 07/10/2015    Cutaneous wart 09/30/2014   Hyperlipidemia with target LDL less than 100 05/04/2012   GERD (gastroesophageal reflux disease) 03/14/2011   Chronic right-sided low back pain with right-sided sciatica 06/25/2009   Morbid obesity (HCC) 02/17/2007   Hypothyroidism 06/14/2006   Diverticulosis of colon 03/01/2006   HIATAL HERNIA WITH REFLUX, HX OF 03/01/2006   Past Medical History:  Diagnosis Date   Anemia    as a  young woman   Diverticulitis 2005   CT    DJD (degenerative joint disease)    GERD (gastroesophageal reflux disease)    H. pylori infection 01/2020   S/p treatment with omeprazole, amoxicillin, and Biaxin.   Headache(784.0)    Hyperlipidemia    Hypothyroidism    Knee pain, right    Low back pain    Neuropathy    peroneal nerve   Neuropathy    left lower leg from back surgery per patient   Obesity    Pneumonia    as child   S/P colonoscopy 2005   Dr. Katrinka Blazing: sigmoid diverticulosis   Thyroid disease    Tobacco abuse     Family History  Problem Relation Age of Onset   Heart attack Sister        pacemaker   Kidney failure Sister  on dialysis   Colon cancer Neg Hx    Stomach cancer Neg Hx    Esophageal cancer Neg Hx     Past Surgical History:  Procedure Laterality Date   BACK SURGERY     BIOPSY  01/29/2020   Procedure: BIOPSY;  Surgeon: Dolores Frame, MD;  Location: [facility];  Service: Gastroenterology;;   BIOPSY  05/13/2020   Procedure: BIOPSY;  Surgeon: Lanelle Bal, DO;  Location: [facility] ENDOSCOPY;  Service: Endoscopy;;   COLONOSCOPY  12/18/2010   Procedure: COLONOSCOPY;  Surgeon: Arlyce Harman, MD; 3 mm sessile polyp at hepatic flexure, pancolonic diverticulosis, internal hemorrhoids, anal lesion just above the dentate line biopsied via cold forceps.  Colonic polyp was a tubular adenoma.  Anus biopsy with mild active inflammation associated with reactive epithelial changes and hemorrhage.  Recommended repeat colonoscopy in 2022.    COLONOSCOPY WITH PROPOFOL N/A 05/13/2020   Procedure: COLONOSCOPY WITH PROPOFOL;  Surgeon: Lanelle Bal, DO;  Location: [facility] ENDOSCOPY;  Service: Endoscopy;  Laterality: N/A;  12:30PM   cyst removal right wrist     ESOPHAGOGASTRODUODENOSCOPY (EGD) WITH PROPOFOL N/A 01/29/2020   Procedure: ESOPHAGOGASTRODUODENOSCOPY (EGD) WITH PROPOFOL;  Surgeon: Marguerita Merles, Reuel Boom, MD;   3 gastric ulcers with clean base, 5 ulcers in the duodenal bulb and first portion of duodenum, 2 were noted to be oozing, treated with bipolar cautery.  Gastric biopsies consistent with H. pylori gastritis.   ESOPHAGOGASTRODUODENOSCOPY (EGD) WITH PROPOFOL N/A 05/13/2020   Procedure: ESOPHAGOGASTRODUODENOSCOPY (EGD) WITH PROPOFOL;  Surgeon: Lanelle Bal, DO;  Location: [facility] ENDOSCOPY;  Service: Endoscopy;  Laterality: N/A;   EYE SURGERY Bilateral    cataract surgery with lens implant   POLYPECTOMY  05/13/2020   Procedure: POLYPECTOMY;  Surgeon: Lanelle Bal, DO;  Location: [facility] ENDOSCOPY;  Service: Endoscopy;;   TONSILLECTOMY     TOTAL KNEE ARTHROPLASTY Right 12/28/2022   Procedure: TOTAL KNEE ARTHROPLASTY;  Surgeon: Cammy Copa, MD;  Location: [facility] OR;  Service: Orthopedics;  Laterality: Right;   TOTAL SHOULDER ARTHROPLASTY Left 12/21/2016   Procedure: TOTAL SHOULDER ARTHROPLASTY;  Surgeon: Cammy Copa, MD;  Location: [facility] OR;  Service: Orthopedics;  Laterality: Left;   TUBAL LIGATION     umbillical hernia     Social History   Occupational History   Not on file  Tobacco Use   Smoking status: Former    Current packs/day: 0.00    Average packs/day: 0.3 packs/day for 51.9 years (13.0 ttl pk-yrs)    Types: Cigarettes    Start date: 09/30/1963    Quit date: 08/15/2015    Years since quitting: 7.5   Smokeless tobacco: Never  Vaping Use   Vaping status: Never Used  Substance and Sexual Activity   Alcohol use: No    Alcohol/week: 0.0 standard drinks of alcohol   Drug use: No   Sexual activity: Not  Currently    Birth control/protection: Post-menopausal

## 2023-03-01 ENCOUNTER — Ambulatory Visit (HOSPITAL_COMMUNITY): Payer: Medicare HMO

## 2023-03-01 DIAGNOSIS — M1711 Unilateral primary osteoarthritis, right knee: Secondary | ICD-10-CM | POA: Diagnosis not present

## 2023-03-01 DIAGNOSIS — Z7409 Other reduced mobility: Secondary | ICD-10-CM

## 2023-03-01 DIAGNOSIS — M25661 Stiffness of right knee, not elsewhere classified: Secondary | ICD-10-CM | POA: Diagnosis not present

## 2023-03-01 NOTE — Therapy (Addendum)
OUTPATIENT PHYSICAL THERAPY LOWER EXTREMITY TREATMENT     Patient Name: Victoria Reyes MRN: [id number] DOB:04-10-1951, 72 y.o., female Today's Date: 03/01/2023  END OF SESSION:  PT End of Session - 03/01/23 0933     Visit Number 12    Number of Visits 20    Date for PT Re-Evaluation 03/17/23    Authorization Type [employer] medicare (12 visits approved)    Authorization Time Period 01/20/23-03/17/23    Authorization - Number of Visits 12    Progress Note Due on Visit 20    PT Start Time 0933    PT Stop Time 1015    PT Time Calculation (min) 42 min    Activity Tolerance Patient tolerated treatment well    Behavior During Therapy [facility] for tasks assessed/performed              Past Medical History:  Diagnosis Date   Anemia    as a  young woman   Diverticulitis 2005   CT    DJD (degenerative joint disease)    GERD (gastroesophageal reflux disease)    H. pylori infection 01/2020   S/p treatment with omeprazole, amoxicillin, and Biaxin.   Headache(784.0)    Hyperlipidemia    Hypothyroidism    Knee pain, right    Low back pain    Neuropathy    peroneal nerve   Neuropathy    left lower leg from back surgery per patient   Obesity    Pneumonia    as child   S/P colonoscopy 2005   Dr. Katrinka Blazing: sigmoid diverticulosis   Thyroid disease    Tobacco abuse    Past Surgical History:  Procedure Laterality Date   BACK SURGERY     BIOPSY  01/29/2020   Procedure: BIOPSY;  Surgeon: Dolores Frame, MD;  Location: [facility];  Service: Gastroenterology;;   BIOPSY  05/13/2020   Procedure: BIOPSY;  Surgeon: Lanelle Bal, DO;  Location: [facility] ENDOSCOPY;  Service: Endoscopy;;   COLONOSCOPY  12/18/2010   Procedure: COLONOSCOPY;  Surgeon: Arlyce Harman, MD; 3 mm sessile polyp at hepatic flexure, pancolonic diverticulosis, internal hemorrhoids, anal lesion just above the dentate line biopsied via cold forceps.  Colonic polyp was a tubular adenoma.  Anus biopsy with mild  active inflammation associated with reactive epithelial changes and hemorrhage.  Recommended repeat colonoscopy in 2022.   COLONOSCOPY WITH PROPOFOL N/A 05/13/2020   Procedure: COLONOSCOPY WITH PROPOFOL;  Surgeon: Lanelle Bal, DO;  Location: [facility] ENDOSCOPY;  Service: Endoscopy;  Laterality: N/A;  12:30PM   cyst removal right wrist     ESOPHAGOGASTRODUODENOSCOPY (EGD) WITH PROPOFOL N/A 01/29/2020   Procedure: ESOPHAGOGASTRODUODENOSCOPY (EGD) WITH PROPOFOL;  Surgeon: Marguerita Merles, Reuel Boom, MD;   3 gastric ulcers with clean base, 5 ulcers in the duodenal bulb and first portion of duodenum, 2 were noted to be oozing, treated with bipolar cautery.  Gastric biopsies consistent with H. pylori gastritis.   ESOPHAGOGASTRODUODENOSCOPY (EGD) WITH PROPOFOL N/A 05/13/2020   Procedure: ESOPHAGOGASTRODUODENOSCOPY (EGD) WITH PROPOFOL;  Surgeon: Lanelle Bal, DO;  Location: [facility] ENDOSCOPY;  Service: Endoscopy;  Laterality: N/A;   EYE SURGERY Bilateral    cataract surgery with lens implant   POLYPECTOMY  05/13/2020   Procedure: POLYPECTOMY;  Surgeon: Lanelle Bal, DO;  Location: [facility] ENDOSCOPY;  Service: Endoscopy;;   TONSILLECTOMY     TOTAL KNEE ARTHROPLASTY Right 12/28/2022   Procedure: TOTAL KNEE ARTHROPLASTY;  Surgeon: Cammy Copa, MD;  Location: [facility] OR;  Service: Orthopedics;  Laterality: Right;   TOTAL SHOULDER ARTHROPLASTY Left 12/21/2016   Procedure: TOTAL SHOULDER ARTHROPLASTY;  Surgeon: Cammy Copa, MD;  Location: [facility] OR;  Service: Orthopedics;  Laterality: Left;   TUBAL LIGATION     umbillical hernia     Patient Active Problem List   Diagnosis Date Noted   OA (osteoarthritis) of knee 12/28/2022   S/P total knee arthroplasty, right 12/28/2022   Low vitamin D level 11/01/2022   Leg swelling 06/28/2022   Giardiasis 08/23/2021   Tapeworm infection 08/23/2021   Leg pain, left 11/17/2020   Alopecia 05/06/2020   History of Helicobacter pylori infection 04/09/2020   Anemia  04/09/2020   History of adenomatous polyp of colon 04/09/2020   Gastric ulcer 01/29/2020   Duodenal ulcer 01/29/2020   Sacro ilial pain 11/08/2019   Chronic back pain 09/11/2019   Immunization refused 03/11/2019   Encounter for annual physical exam 07/10/2015   Cutaneous wart 09/30/2014   Hyperlipidemia with target LDL less than 100 05/04/2012   GERD (gastroesophageal reflux disease) 03/14/2011   Chronic right-sided low back pain with right-sided sciatica 06/25/2009   Morbid obesity (HCC) 02/17/2007   Hypothyroidism 06/14/2006   Diverticulosis of colon 03/01/2006   HIATAL HERNIA WITH REFLUX, HX OF 03/01/2006    PCP: Syliva Overman MD  REFERRING PROVIDER:   Cammy Copa, MD    REFERRING DIAG: M17.11 (ICD-10-CM) - Unilateral primary osteoarthritis, right knee   THERAPY DIAG:  Decreased range of motion (ROM) of right knee  Impaired functional mobility, balance, gait, and endurance  Rationale for Evaluation and Treatment: Rehabilitation  ONSET DATE: 12/28/22  SUBJECTIVE:   SUBJECTIVE STATEMENT: Saw MD since last visit. Will return to another follow up in December. Will decide on manipulation then, currently wants to continue to focus on gaining flexion ROM and strength. Patient used her CPM this morning for an hour. Does not have pain currently, just stiffness.   EVAL: Coming in for R TKA. 17th of this Month. Pt received home health PT. Pt reports getting around the house just fine, badges are starting to come off. Pt lives with daughter/son and grandchildren. Before the knee replacement, pt was independent with functional mobility and ADLs. Pt currently utilizing AD for ambulation, not driving currently. Assist from daugther for transfers into tub. Pt known to this clinc with prior TSA.   PERTINENT HISTORY: Lumbar fusion PAIN:  Are you having pain? Yes: NPRS scale: 5/10 Pain location: R knee Pain description: dull pain Aggravating factors: nothing Relieving  factors: pain medications, ice  PRECAUTIONS: None  RED FLAGS: None   WEIGHT BEARING RESTRICTIONS: No  FALLS:  Has patient fallen in last 6 months? No  LIVING ENVIRONMENT: Lives with: lives with their family Lives in: House/apartment Stairs: Yes: External: 4 steps; can reach both Has following equipment at home: Dan Humphreys - 2 wheeled  OCCUPATION: Retired  PLOF: Independent  PATIENT GOALS: "go get grand-baby at school"  NEXT MD VISIT: 02/07/23  OBJECTIVE:  Note: Objective measures were completed at Evaluation unless otherwise noted.  DIAGNOSTIC FINDINGS:   PATIENT SURVEYS:  FOTO 50.1%  COGNITION: Overall cognitive status: Within functional limits for tasks assessed     SENSATION: WFL  POSTURE: No Significant postural limitations  PALPATION: Not tender to Palpation  LOWER EXTREMITY ROM:  Active ROM Right eval Left eval Right 02/01/23 Right 02/10/23 Right 02/22/23  Hip flexion       Hip extension       Hip abduction  Hip adduction       Hip internal rotation       Hip external rotation       Knee flexion 37 110 60, 63 AAROM 75 77  Knee extension -5 0  -3 (lacking) -3 lacking  Ankle dorsiflexion       Ankle plantarflexion       Ankle inversion       Ankle eversion        (Blank rows = not tested)  LOWER EXTREMITY MMT:  MMT Right eval Left eval Right 02/22/23  Hip flexion   4  Hip extension   2+  Hip abduction   4  Hip adduction     Hip internal rotation     Hip external rotation     Knee flexion   2+ limited range  Knee extension  4- 4-  Ankle dorsiflexion     Ankle plantarflexion     Ankle inversion     Ankle eversion      (Blank rows = not tested)  FUNCTIONAL TESTS:  30 seconds chair stand test: 8x 2 minute walk test: 227ft w/ RW  02/22/23: no AD: 386 30 seconds chair stand test: 13x  GAIT: Distance walked: 217ft Assistive device utilized: Walker - 2 wheeled Level of assistance: Modified independence Comments: Mild  R circumduction during swing, limited R knee flexion during swing. Mild antalgic gait pattern with RLE   TODAY'S TREATMENT:                                                                                                                              DATE:  03/01/2023 STM and man ROM to right knee in supine and sitting with contract relax x15' to improve soft tissue extensibility and right knee mobility SAQ with manual resistance 2x10 SAQ with SLR 2 x 10 Knee drives on 4" step 5x10" hold in parallel bars Step ups x 15 on 4" Bike seat 8 x 3' for knee ROM half revolutions  02/24/2023 Supine: STM and manual ROM right knee for 25' for right knee flexion and extension; seated contract relax for quads and hamstrings Supine -2 to 78; sitting knee flexion AAROM 82 degrees Standing: Knee drives on 7" step for flexion  2' Bike seat 8 x 3' for knee ROM half revolutions  02/22/23: Seated: knee hang with AAROM 5x 20" FOTO: 76% STM and retro grade massage for edema control x with LE elevated MMT See above Prone: Quad stretch with rope 3x 30" : 386 30 seconds chair stand test: 13x Knee drive on 2nd step (74QV) AROM 3-77 degrees  02/17/2023 STM and man ROM to right knee in supine and sitting with contract relax x15' to improve soft tissue extensibility and right knee mobility Supine SLR 2 x until fatigue SAQ 2 x until fatigue  Modified thomas stretch 2x30 sec paired with x15 LAQ between sets Mini squats to elevated mat 3x15  02/15/23 STM and man ROM to right knee in supine and sitting with contract relax x15' to improve soft tissue extensibility and right knee mobility Heel slides AAROM with strap 3' Squats to elevated mat 2x15;  Heel raises 2x15 with hand support Education about exercise modification, scar massage, and mobility at home  Measured knee flexion in supine at EOS. 80 AROM, 83 AAROM   02/10/2023 STM and man ROM to right knee in supine and sitting with contract  relax x 10' to improve soft tissue extensibility and right knee mobility Standing: 12" box Right knee drives for flexion x 2' Heel raises 2 x 10 Squats 2 x 10 Nustep seat 8 arms 10 for mobility x 5' AROM right knee in supine -3 to 75 Seated heel slides x 5 with overpressure from left leg   02/08/23 Manual in supine. Retrograde massage over quads, hamstrings, and calf. Patella mobs all directions. Tender to palpation over later joint line and medial hamstring insertion.  Supine:  Quad sets 10x5"  TKE 15 x 5" on half bolster (Short arc quad)  SLR with quad set prior raise 10x  Heel Slides x 10 with rope  PROM: knee ext: -3 degrees Standing:  Knee drive for flexion on 8in step height 5x 20"  Step ups on 4in step height 20x Step navigation (up 8in and down 4in) Gait training with and without [facility]   02/03/23: Gait training with SPC, cueing for UE and sequence with initial 3 point step to to 2 point step thru 2RT inside // bars for heel strike, swing through, toe push off, knee flexion  Manual:  Supine with LE elevated  Retrograde massage  Patella mobs all directions Supine:  Quad sets 10x 5"  TKE 15 x 5" on half bolster  SLR with quad set prior raise 10x  Heel slide 10x 5-10" holds at end range   AROM 4- 68 degrees Seated: AAROM heel slide 10x 10" Standing:  Knee drive for flexion on 8in step height 5x 20" Bike seat 10 rocking x 5'  02/01/23 Seated: Leg dangle for flexion Calf/hamstring stretch with strap 2 x 30" Supine: STM to right knee to increase soft tissue mobility and joint ROM x 10' AROM right knee flexion 60 degrees; 63 AAROM SAQ x 10 SAQ into SLR  x 10  Standing: Knee drives for flexion x 2' on 7" step Heel toe raises x 10   01/27/2023  -Seated knee flexion active stretch 5 x 30'' hold- cues for reduced trunk lean compensation. -Heel slides with a strap x 10 x 3 with 30" hold on flexion  -Supine R SLR x 15 with tactile cues at distal foot for improved  knee extesion -Seated LAQ 10 x 3  -Standing weight shifts x 10 bilaterally with symmetrical foot placement tactile cues at hips within Rw.  -Elevated sit/stands with even foot placement and no UE support 2 x 10- 6/10 pain. Cues for symmetry.   01/25/23 NuStep, seat 10 , arm 8, level 1 x 5' Heel slides with a strap x 10 x 3 with 30" hold on flexion on the last rep Supine R SLR x 10 x 2 R LAQ x 3" x 10 x 2 Heel/toe raises with UE support x 10 x 2 Standing hip abd/ext  with UE support x 10 x 2   01/20/2023 PT Evaluation, findings, HEP, prognosis, and frequency   PATIENT EDUCATION:  Education details: see below Person educated: Patient Education method: Medical illustrator Education comprehension:  verbalized understanding  HOME EXERCISE PROGRAM: Access Code: [email or web address] URL: [email or web address] 01/25/2023 - Supine Active Straight Leg Raise  - 1-2 x daily - 7 x weekly - 2 sets - 10 reps - Heel Toe Raises with Counter Support  - 1-2 x daily - 7 x weekly - 2 sets - 10 reps - Seated Long Arc Quad  - 1-2 x daily - 7 x weekly - 2 sets - 10 reps - 3 hold - Standing Hip Extension with Counter Support  - 1-2 x daily - 7 x weekly - 2 sets - 10 reps - Standing Hip Abduction with Counter Support  - 1-2 x daily - 7 x weekly - 2 sets - 10 reps  Date: 01/20/2023 Prepared by: Starling Manns  Exercises - Supine Heel Slide  - 1 x daily - 7 x weekly - 3 sets - 10 reps - Seated Knee Flexion Extension AROM   - 1 x daily - 7 x weekly - 3 sets - 10 reps - Supine Gluteal Sets  - 1 x daily - 7 x weekly - 3 sets - 10 reps - Supine Knee Extension Stretch on Towel Roll  - 1 x daily - 7 x weekly - 3 sets - 10 reps  ASSESSMENT:  CLINICAL IMPRESSION: Today's session focused on improving knee flexion ROM, end range quad control and strength, and standing coordination. STM and contract-relax interventions allowed for an increase in knee flexion PROM. Scar mobilizations to distal quad  continue to be beneficial, but patient is notably restricted with patellar mobilizations, especially superior and inferior glides. Patient also demonstrates poor coordination with standing exercises and during gait. With min to mod verbal and tactile cues, she demonstrated improved control and form. Patient is ready to progress to more standing strength exercise with a focus on movement coordination. Patient will continue to benefit from skilled intervention to address unmet goals, promote return to optimal function, and increase knee ROM.       OBJECTIVE IMPAIRMENTS: Abnormal gait, decreased activity tolerance, decreased balance, decreased mobility, difficulty walking, decreased ROM, decreased strength, hypomobility, increased fascial restrictions, impaired flexibility, and pain.   ACTIVITY LIMITATIONS: carrying, lifting, bending, sitting, standing, squatting, stairs, transfers, and locomotion level  PARTICIPATION LIMITATIONS: driving, shopping, community activity, occupation, and yard work  PERSONAL FACTORS: Age are also affecting patient's functional outcome.   REHAB POTENTIAL: Good  CLINICAL DECISION MAKING: Stable/uncomplicated  EVALUATION COMPLEXITY: Low   GOALS: Goals reviewed with patient? No  SHORT TERM GOALS: Target date: 11/7/024  Pt will be independent with HEP in order to demonstrate participation in Physical Therapy POC.  Baseline:  Reports compliance with HEP Goal status: MET  2.  Pt will report 3/10 pain improvement during functional activity n order to demonstrate improved pain with functional activities.  Baseline: Reports increased pain with stretches 7/10 Goal status: IN PROGRESS  LONG TERM GOALS: Target date: 03/17/2023  Pt will improve 30 second chair test by > 2 reps in order to demonstrate improved functional strength to return to desired activities.  Baseline: 8x; 02/22/23: 13x no HHA Goal status: MET  2.  Pt will improve 2 MWT by >176ft with LRAD in  order to demonstrate improved functional ambulatory capacity in community setting.  Baseline: 25ft with RW; 02/22/23: 386 ft no AD Goal status: MET  3.  Pt will improve FOTO score by > 5 points in order to demonstrate improved pain with functional goals and outcomes. Baseline: 50.1 02/22/23: 76% Goal status: MET  4.  Pt  will improve R knee extension ROM to 0 degrees in order to improve knee mobility during functional activities. Baseline: -5 degrees from 0; 02/22/23: AROM 3-77 degrees Goal status: IN PROGRESS  5.  Pt will improve R knee flexion ROM to >110 degrees in order to improve knee mobility during functional activities. Baseline: 45 degrees currently; 02/22/23: AROM 3-77 degrees Goal status: IN PROGRESS    PLAN:   PT FREQUENCY: 2x/week  PT DURATION: 8 weeks  PLANNED INTERVENTIONS: 97146- PT Re-evaluation, 97110-Therapeutic exercises, 97530- Therapeutic activity, 97112- Neuromuscular re-education, 97535- Self Care, 17616- Manual therapy, [id number]- Gait training, Patient/Family education, Balance training, Stair training, and Joint mobilization  PLAN FOR NEXT SESSION: Continue POC and progress as tolerated with emphasis on R knee AROM & PROM (especially flexion), strength as well as hip strengthening. Modification to squat with Add standing work on balance and gait to improve functional activities.  Add PROM, contract relax, prolonged quad stretch.  Bridges to HEP for gluteal strengthening.     9:35 AM, 03/01/23 Karna Christmas, SPT  " I agree with the following treatment note after reviewing documentation. This session was performed under the supervision of a licensed clinician."    12:15 PM, 03/01/23 Amy Small Lynch MPT [place] [facility] [place] [phone]

## 2023-03-03 ENCOUNTER — Ambulatory Visit (HOSPITAL_COMMUNITY): Payer: Medicare HMO

## 2023-03-03 DIAGNOSIS — Z7409 Other reduced mobility: Secondary | ICD-10-CM

## 2023-03-03 DIAGNOSIS — M1711 Unilateral primary osteoarthritis, right knee: Secondary | ICD-10-CM | POA: Diagnosis not present

## 2023-03-03 DIAGNOSIS — M25661 Stiffness of right knee, not elsewhere classified: Secondary | ICD-10-CM | POA: Diagnosis not present

## 2023-03-03 NOTE — Therapy (Addendum)
OUTPATIENT PHYSICAL THERAPY LOWER EXTREMITY TREATMENT     Patient Name: Victoria Reyes MRN: [id number] DOB:Nov 11, 1950, 72 y.o., female Today's Date: 03/03/2023  END OF SESSION:  PT End of Session - 03/03/23 0939     Visit Number 13    Number of Visits 20    Date for PT Re-Evaluation 03/17/23    Authorization Type [employer] medicare (12 visits approved)    Authorization Time Period 01/20/23-03/17/23    Authorization - Number of Visits 12    Progress Note Due on Visit 20    PT Start Time 0933    PT Stop Time 1015    PT Time Calculation (min) 42 min    Activity Tolerance Patient tolerated treatment well    Behavior During Therapy [employer] for tasks assessed/performed              Past Medical History:  Diagnosis Date   Anemia    as a  young woman   Diverticulitis 2005   CT    DJD (degenerative joint disease)    GERD (gastroesophageal reflux disease)    H. pylori infection 01/2020   S/p treatment with omeprazole, amoxicillin, and Biaxin.   Headache(784.0)    Hyperlipidemia    Hypothyroidism    Knee pain, right    Low back pain    Neuropathy    peroneal nerve   Neuropathy    left lower leg from back surgery per patient   Obesity    Pneumonia    as child   S/P colonoscopy 2005   Dr. Katrinka Blazing: sigmoid diverticulosis   Thyroid disease    Tobacco abuse    Past Surgical History:  Procedure Laterality Date   BACK SURGERY     BIOPSY  01/29/2020   Procedure: BIOPSY;  Surgeon: Dolores Frame, MD;  Location: [facility];  Service: Gastroenterology;;   BIOPSY  05/13/2020   Procedure: BIOPSY;  Surgeon: Lanelle Bal, DO;  Location: [facility] ENDOSCOPY;  Service: Endoscopy;;   COLONOSCOPY  12/18/2010   Procedure: COLONOSCOPY;  Surgeon: Arlyce Harman, MD; 3 mm sessile polyp at hepatic flexure, pancolonic diverticulosis, internal hemorrhoids, anal lesion just above the dentate line biopsied via cold forceps.  Colonic polyp was a tubular adenoma.  Anus biopsy with mild  active inflammation associated with reactive epithelial changes and hemorrhage.  Recommended repeat colonoscopy in 2022.   COLONOSCOPY WITH PROPOFOL N/A 05/13/2020   Procedure: COLONOSCOPY WITH PROPOFOL;  Surgeon: Lanelle Bal, DO;  Location: [facility] ENDOSCOPY;  Service: Endoscopy;  Laterality: N/A;  12:30PM   cyst removal right wrist     ESOPHAGOGASTRODUODENOSCOPY (EGD) WITH PROPOFOL N/A 01/29/2020   Procedure: ESOPHAGOGASTRODUODENOSCOPY (EGD) WITH PROPOFOL;  Surgeon: Marguerita Merles, Reuel Boom, MD;   3 gastric ulcers with clean base, 5 ulcers in the duodenal bulb and first portion of duodenum, 2 were noted to be oozing, treated with bipolar cautery.  Gastric biopsies consistent with H. pylori gastritis.   ESOPHAGOGASTRODUODENOSCOPY (EGD) WITH PROPOFOL N/A 05/13/2020   Procedure: ESOPHAGOGASTRODUODENOSCOPY (EGD) WITH PROPOFOL;  Surgeon: Lanelle Bal, DO;  Location: [facility] ENDOSCOPY;  Service: Endoscopy;  Laterality: N/A;   EYE SURGERY Bilateral    cataract surgery with lens implant   POLYPECTOMY  05/13/2020   Procedure: POLYPECTOMY;  Surgeon: Lanelle Bal, DO;  Location: [facility] ENDOSCOPY;  Service: Endoscopy;;   TONSILLECTOMY     TOTAL KNEE ARTHROPLASTY Right 12/28/2022   Procedure: TOTAL KNEE ARTHROPLASTY;  Surgeon: Cammy Copa, MD;  Location: [facility] OR;  Service: Orthopedics;  Laterality: Right;   TOTAL SHOULDER ARTHROPLASTY Left 12/21/2016   Procedure: TOTAL SHOULDER ARTHROPLASTY;  Surgeon: Cammy Copa, MD;  Location: [facility] OR;  Service: Orthopedics;  Laterality: Left;   TUBAL LIGATION     umbillical hernia     Patient Active Problem List   Diagnosis Date Noted   OA (osteoarthritis) of knee 12/28/2022   S/P total knee arthroplasty, right 12/28/2022   Low vitamin D level 11/01/2022   Leg swelling 06/28/2022   Giardiasis 08/23/2021   Tapeworm infection 08/23/2021   Leg pain, left 11/17/2020   Alopecia 05/06/2020   History of Helicobacter pylori infection 04/09/2020   Anemia  04/09/2020   History of adenomatous polyp of colon 04/09/2020   Gastric ulcer 01/29/2020   Duodenal ulcer 01/29/2020   Sacro ilial pain 11/08/2019   Chronic back pain 09/11/2019   Immunization refused 03/11/2019   Encounter for annual physical exam 07/10/2015   Cutaneous wart 09/30/2014   Hyperlipidemia with target LDL less than 100 05/04/2012   GERD (gastroesophageal reflux disease) 03/14/2011   Chronic right-sided low back pain with right-sided sciatica 06/25/2009   Morbid obesity (HCC) 02/17/2007   Hypothyroidism 06/14/2006   Diverticulosis of colon 03/01/2006   HIATAL HERNIA WITH REFLUX, HX OF 03/01/2006    PCP: Syliva Overman MD  REFERRING PROVIDER:   Cammy Copa, MD    REFERRING DIAG: M17.11 (ICD-10-CM) - Unilateral primary osteoarthritis, right knee   THERAPY DIAG:  Arthritis of right knee  Decreased range of motion (ROM) of right knee  Impaired functional mobility, balance, gait, and endurance  Rationale for Evaluation and Treatment: Rehabilitation  ONSET DATE: 12/28/22  SUBJECTIVE:   SUBJECTIVE STATEMENT: Patient has no new updates since last session. Says her CPM wont work beyond 90 degrees. Neither of her kids can get it work either. Generally feeling stiff with the cold weather this morning.   EVAL: Coming in for R TKA. 17th of this Month. Pt received home health PT. Pt reports getting around the house just fine, badges are starting to come off. Pt lives with daughter/son and grandchildren. Before the knee replacement, pt was independent with functional mobility and ADLs. Pt currently utilizing AD for ambulation, not driving currently. Assist from daugther for transfers into tub. Pt known to this clinc with prior TSA.   PERTINENT HISTORY: Lumbar fusion PAIN:  Are you having pain? Yes: NPRS scale: 5/10 Pain location: R knee Pain description: dull pain Aggravating factors: nothing Relieving factors: pain medications, ice  PRECAUTIONS:  None  RED FLAGS: None   WEIGHT BEARING RESTRICTIONS: No  FALLS:  Has patient fallen in last 6 months? No  LIVING ENVIRONMENT: Lives with: lives with their family Lives in: House/apartment Stairs: Yes: External: 4 steps; can reach both Has following equipment at home: Dan Humphreys - 2 wheeled  OCCUPATION: Retired  PLOF: Independent  PATIENT GOALS: "go get grand-baby at school"  NEXT MD VISIT: 02/07/23  OBJECTIVE:  Note: Objective measures were completed at Evaluation unless otherwise noted.  DIAGNOSTIC FINDINGS:   PATIENT SURVEYS:  FOTO 50.1%  COGNITION: Overall cognitive status: Within functional limits for tasks assessed     SENSATION: WFL  POSTURE: No Significant postural limitations  PALPATION: Not tender to Palpation  LOWER EXTREMITY ROM:  Active ROM Right eval Left eval Right 02/01/23 Right 02/10/23 Right 02/22/23  Hip flexion       Hip extension       Hip abduction       Hip adduction  Hip internal rotation       Hip external rotation       Knee flexion 37 110 60, 63 AAROM 75 77  Knee extension -5 0  -3 (lacking) -3 lacking  Ankle dorsiflexion       Ankle plantarflexion       Ankle inversion       Ankle eversion        (Blank rows = not tested)  LOWER EXTREMITY MMT:  MMT Right eval Left eval Right 02/22/23  Hip flexion   4  Hip extension   2+  Hip abduction   4  Hip adduction     Hip internal rotation     Hip external rotation     Knee flexion   2+ limited range  Knee extension  4- 4-  Ankle dorsiflexion     Ankle plantarflexion     Ankle inversion     Ankle eversion      (Blank rows = not tested)  FUNCTIONAL TESTS:  30 seconds chair stand test: 8x 2 minute walk test: 242ft w/ RW  02/22/23: no AD: 386 30 seconds chair stand test: 13x  GAIT: Distance walked: 222ft Assistive device utilized: Walker - 2 wheeled Level of assistance: Modified independence Comments: Mild R circumduction during swing, limited R knee  flexion during swing. Mild antalgic gait pattern with RLE   TODAY'S TREATMENT:                                                                                                                              DATE:   03/03/2023 Started with moist heat over knee 5' STM and man ROM to right knee in supine and sitting with contract relax x10' to improve soft tissue extensibility and right knee mobility. Manual distraction at the end. LAQ with manual resistance 2x10 Heel raises with toes on incline 2x20 Knee drives on 4" step 10x5" hold in parallel bars - ROM measurement in standing 78 Lateral step up on 4" step 2x10, 1 UE support  03/01/2023 STM and man ROM to right knee in supine and sitting with contract relax x15' to improve soft tissue extensibility and right knee mobility SAQ with manual resistance 2x10 SAQ with SLR 2 x 10 Knee drives on 4" step 5x10" hold in parallel bars Step ups x 15 on 4" Bike seat 8 x 3' for knee ROM half revolutions  02/24/2023 Supine: STM and manual ROM right knee for 25' for right knee flexion and extension; seated contract relax for quads and hamstrings Supine -2 to 78; sitting knee flexion AAROM 82 degrees Standing: Knee drives on 7" step for flexion  2' Bike seat 8 x 3' for knee ROM half revolutions  02/22/23: Seated: knee hang with AAROM 5x 20" FOTO: 76% STM and retro grade massage for edema control x with LE elevated MMT See above Prone: Quad stretch with rope 3x 30" : 386 30 seconds chair stand  test: 13x Knee drive on 2nd step (62ZH) AROM 3-77 degrees  02/17/2023 STM and man ROM to right knee in supine and sitting with contract relax x15' to improve soft tissue extensibility and right knee mobility Supine SLR 2 x until fatigue SAQ 2 x until fatigue  Modified thomas stretch 2x30 sec paired with x15 LAQ between sets Mini squats to elevated mat 3x15    02/15/23 STM and man ROM to right knee in supine and sitting with contract relax  x15' to improve soft tissue extensibility and right knee mobility Heel slides AAROM with strap 3' Squats to elevated mat 2x15;  Heel raises 2x15 with hand support Education about exercise modification, scar massage, and mobility at home  Measured knee flexion in supine at EOS. 80 AROM, 83 AAROM   02/10/2023 STM and man ROM to right knee in supine and sitting with contract relax x 10' to improve soft tissue extensibility and right knee mobility Standing: 12" box Right knee drives for flexion x 2' Heel raises 2 x 10 Squats 2 x 10 Nustep seat 8 arms 10 for mobility x 5' AROM right knee in supine -3 to 75 Seated heel slides x 5 with overpressure from left leg   02/08/23 Manual in supine. Retrograde massage over quads, hamstrings, and calf. Patella mobs all directions. Tender to palpation over later joint line and medial hamstring insertion.  Supine:  Quad sets 10x5"  TKE 15 x 5" on half bolster (Short arc quad)  SLR with quad set prior raise 10x  Heel Slides x 10 with rope  PROM: knee ext: -3 degrees Standing:  Knee drive for flexion on 8in step height 5x 20"  Step ups on 4in step height 20x Step navigation (up 8in and down 4in) Gait training with and without [facility]   02/03/23: Gait training with SPC, cueing for UE and sequence with initial 3 point step to to 2 point step thru 2RT inside // bars for heel strike, swing through, toe push off, knee flexion  Manual:  Supine with LE elevated  Retrograde massage  Patella mobs all directions Supine:  Quad sets 10x 5"  TKE 15 x 5" on half bolster  SLR with quad set prior raise 10x  Heel slide 10x 5-10" holds at end range   AROM 4- 68 degrees Seated: AAROM heel slide 10x 10" Standing:  Knee drive for flexion on 8in step height 5x 20" Bike seat 10 rocking x 5'  02/01/23 Seated: Leg dangle for flexion Calf/hamstring stretch with strap 2 x 30" Supine: STM to right knee to increase soft tissue mobility and joint ROM x 10' AROM  right knee flexion 60 degrees; 63 AAROM SAQ x 10 SAQ into SLR  x 10  Standing: Knee drives for flexion x 2' on 7" step Heel toe raises x 10   01/27/2023  -Seated knee flexion active stretch 5 x 30'' hold- cues for reduced trunk lean compensation. -Heel slides with a strap x 10 x 3 with 30" hold on flexion  -Supine R SLR x 15 with tactile cues at distal foot for improved knee extesion -Seated LAQ 10 x 3  -Standing weight shifts x 10 bilaterally with symmetrical foot placement tactile cues at hips within Rw.  -Elevated sit/stands with even foot placement and no UE support 2 x 10- 6/10 pain. Cues for symmetry.   01/25/23 NuStep, seat 10 , arm 8, level 1 x 5' Heel slides with a strap x 10 x 3 with 30" hold on  flexion on the last rep Supine R SLR x 10 x 2 R LAQ x 3" x 10 x 2 Heel/toe raises with UE support x 10 x 2 Standing hip abd/ext  with UE support x 10 x 2   01/20/2023 PT Evaluation, findings, HEP, prognosis, and frequency   PATIENT EDUCATION:  Education details: see below Person educated: Patient Education method: Explanation and Demonstration Education comprehension: verbalized understanding  HOME EXERCISE PROGRAM: Access Code: [email or web address] URL: [email or web address] 01/25/2023 - Supine Active Straight Leg Raise  - 1-2 x daily - 7 x weekly - 2 sets - 10 reps - Heel Toe Raises with Counter Support  - 1-2 x daily - 7 x weekly - 2 sets - 10 reps - Seated Long Arc Quad  - 1-2 x daily - 7 x weekly - 2 sets - 10 reps - 3 hold - Standing Hip Extension with Counter Support  - 1-2 x daily - 7 x weekly - 2 sets - 10 reps - Standing Hip Abduction with Counter Support  - 1-2 x daily - 7 x weekly - 2 sets - 10 reps  Date: 01/20/2023 Prepared by: Starling Manns  Exercises - Supine Heel Slide  - 1 x daily - 7 x weekly - 3 sets - 10 reps - Seated Knee Flexion Extension AROM   - 1 x daily - 7 x weekly - 3 sets - 10 reps - Supine Gluteal Sets  - 1 x daily - 7 x weekly - 3  sets - 10 reps - Supine Knee Extension Stretch on Towel Roll  - 1 x daily - 7 x weekly - 3 sets - 10 reps  ASSESSMENT:  CLINICAL IMPRESSION: Today's session focused on improving knee flexion ROM, end range quad control and strength. Moist heat and STM and contract-relax interventions allowed for an increase in knee flexion PROM. Scar mobilizations to distal quad continue to be beneficial, but patient is notably restricted with patellar mobilizations, especially superior and inferior glides. Patient also demonstrates poor coordination with standing exercises and during gait. During step ups and knee drives patient needed min to mod verbal and tactile cues, she demonstrated improved control and form. Patient is ready to progress to more standing strength exercise with a focus on movement coordination. Patient will continue to benefit from skilled intervention to address unmet goals, promote return to optimal function, and increase knee ROM.       OBJECTIVE IMPAIRMENTS: Abnormal gait, decreased activity tolerance, decreased balance, decreased mobility, difficulty walking, decreased ROM, decreased strength, hypomobility, increased fascial restrictions, impaired flexibility, and pain.   ACTIVITY LIMITATIONS: carrying, lifting, bending, sitting, standing, squatting, stairs, transfers, and locomotion level  PARTICIPATION LIMITATIONS: driving, shopping, community activity, occupation, and yard work  PERSONAL FACTORS: Age are also affecting patient's functional outcome.   REHAB POTENTIAL: Good  CLINICAL DECISION MAKING: Stable/uncomplicated  EVALUATION COMPLEXITY: Low   GOALS: Goals reviewed with patient? No  SHORT TERM GOALS: Target date: 11/7/024  Pt will be independent with HEP in order to demonstrate participation in Physical Therapy POC.  Baseline:  Reports compliance with HEP Goal status: MET  2.  Pt will report 3/10 pain improvement during functional activity n order to demonstrate  improved pain with functional activities.  Baseline: Reports increased pain with stretches 7/10 Goal status: IN PROGRESS  LONG TERM GOALS: Target date: 03/17/2023  Pt will improve 30 second chair test by > 2 reps in order to demonstrate improved functional strength to return to desired activities.  Baseline: 8x; 02/22/23:  13x no HHA Goal status: MET  2.  Pt will improve 2 MWT by >118ft with LRAD in order to demonstrate improved functional ambulatory capacity in community setting.  Baseline: 274ft with RW; 02/22/23: 386 ft no AD Goal status: MET  3.  Pt will improve FOTO score by > 5 points in order to demonstrate improved pain with functional goals and outcomes. Baseline: 50.1 02/22/23: 76% Goal status: MET  4.  Pt will improve R knee extension ROM to 0 degrees in order to improve knee mobility during functional activities. Baseline: -5 degrees from 0; 02/22/23: AROM 3-77 degrees Goal status: IN PROGRESS  5.  Pt will improve R knee flexion ROM to >110 degrees in order to improve knee mobility during functional activities. Baseline: 45 degrees currently; 02/22/23: AROM 3-77 degrees Goal status: IN PROGRESS    PLAN:   PT FREQUENCY: 2x/week  PT DURATION: 8 weeks  PLANNED INTERVENTIONS: 97146- PT Re-evaluation, 97110-Therapeutic exercises, 97530- Therapeutic activity, 97112- Neuromuscular re-education, 97535- Self Care, 16109- Manual therapy, [phone]- Gait training, Patient/Family education, Balance training, Stair training, and Joint mobilization  PLAN FOR NEXT SESSION: Continue POC and progress as tolerated with emphasis on R knee AROM & PROM (especially flexion), strength as well as hip strengthening. Modification to squat with Add standing work on balance and gait to improve functional activities.  Add PROM, contract relax, prolonged quad stretch.  Bridges to HEP for gluteal strengthening.     9:40 AM, 03/03/23 Karna Christmas, SPT  " I agree with the following treatment note  after reviewing documentation. This session was performed under the supervision of a licensed clinician."  Maylon Peppers, PT, DPT   [facility] Auth Request  Referring diagnosis code (ICD 10)? M17.11 Treatment diagnosis codes (ICD 10)? (if different than referring diagnosis) M25.661, Z74.09 What was this (referring dx) caused by? [x]  Surgery []  Fall []  Ongoing issue []  Arthritis []  Other: ____________  Laterality: [x]  Rt []  Lt []  Both  Deficits: [x]  Pain [x]  Stiffness [x]  Weakness [x]  Edema [x]  Balance Deficits []  Coordination [x]  Gait Disturbance [x]  ROM []  Other   Functional Tool Score: FOTO 72  CPT codes: See Planned Interventions listed in the Plan section of the Evaluation.

## 2023-03-07 ENCOUNTER — Ambulatory Visit (HOSPITAL_COMMUNITY): Payer: Medicare HMO

## 2023-03-07 ENCOUNTER — Telehealth: Payer: Self-pay | Admitting: Orthopedic Surgery

## 2023-03-07 DIAGNOSIS — M1711 Unilateral primary osteoarthritis, right knee: Secondary | ICD-10-CM

## 2023-03-07 DIAGNOSIS — M25661 Stiffness of right knee, not elsewhere classified: Secondary | ICD-10-CM | POA: Diagnosis not present

## 2023-03-07 DIAGNOSIS — Z7409 Other reduced mobility: Secondary | ICD-10-CM | POA: Diagnosis not present

## 2023-03-07 NOTE — Therapy (Addendum)
OUTPATIENT PHYSICAL THERAPY LOWER EXTREMITY TREATMENT     Patient Name: Victoria Reyes MRN: [id number] DOB:Mar 30, 1951, 72 y.o., female Today's Date: 03/07/2023  END OF SESSION:  PT End of Session - 03/07/23 0802     Visit Number 14    Number of Visits 20    Date for PT Re-Evaluation 03/17/23    Authorization Type [employer] medicare (12 visits approved)    Authorization Time Period 01/20/23-03/17/23    Authorization - Number of Visits 12    Progress Note Due on Visit 20    PT Start Time 0802    PT Stop Time 0845    PT Time Calculation (min) 43 min    Activity Tolerance Patient tolerated treatment well    Behavior During Therapy [facility] for tasks assessed/performed              Past Medical History:  Diagnosis Date   Anemia    as a  young woman   Diverticulitis 2005   CT    DJD (degenerative joint disease)    GERD (gastroesophageal reflux disease)    H. pylori infection 01/2020   S/p treatment with omeprazole, amoxicillin, and Biaxin.   Headache(784.0)    Hyperlipidemia    Hypothyroidism    Knee pain, right    Low back pain    Neuropathy    peroneal nerve   Neuropathy    left lower leg from back surgery per patient   Obesity    Pneumonia    as child   S/P colonoscopy 2005   Dr. Katrinka Blazing: sigmoid diverticulosis   Thyroid disease    Tobacco abuse    Past Surgical History:  Procedure Laterality Date   BACK SURGERY     BIOPSY  01/29/2020   Procedure: BIOPSY;  Surgeon: Dolores Frame, MD;  Location: [facility];  Service: Gastroenterology;;   BIOPSY  05/13/2020   Procedure: BIOPSY;  Surgeon: Lanelle Bal, DO;  Location: [facility] ENDOSCOPY;  Service: Endoscopy;;   COLONOSCOPY  12/18/2010   Procedure: COLONOSCOPY;  Surgeon: Arlyce Harman, MD; 3 mm sessile polyp at hepatic flexure, pancolonic diverticulosis, internal hemorrhoids, anal lesion just above the dentate line biopsied via cold forceps.  Colonic polyp was a tubular adenoma.  Anus biopsy with mild  active inflammation associated with reactive epithelial changes and hemorrhage.  Recommended repeat colonoscopy in 2022.   COLONOSCOPY WITH PROPOFOL N/A 05/13/2020   Procedure: COLONOSCOPY WITH PROPOFOL;  Surgeon: Lanelle Bal, DO;  Location: [facility] ENDOSCOPY;  Service: Endoscopy;  Laterality: N/A;  12:30PM   cyst removal right wrist     ESOPHAGOGASTRODUODENOSCOPY (EGD) WITH PROPOFOL N/A 01/29/2020   Procedure: ESOPHAGOGASTRODUODENOSCOPY (EGD) WITH PROPOFOL;  Surgeon: Marguerita Merles, Reuel Boom, MD;   3 gastric ulcers with clean base, 5 ulcers in the duodenal bulb and first portion of duodenum, 2 were noted to be oozing, treated with bipolar cautery.  Gastric biopsies consistent with H. pylori gastritis.   ESOPHAGOGASTRODUODENOSCOPY (EGD) WITH PROPOFOL N/A 05/13/2020   Procedure: ESOPHAGOGASTRODUODENOSCOPY (EGD) WITH PROPOFOL;  Surgeon: Lanelle Bal, DO;  Location: [facility] ENDOSCOPY;  Service: Endoscopy;  Laterality: N/A;   EYE SURGERY Bilateral    cataract surgery with lens implant   POLYPECTOMY  05/13/2020   Procedure: POLYPECTOMY;  Surgeon: Lanelle Bal, DO;  Location: [facility] ENDOSCOPY;  Service: Endoscopy;;   TONSILLECTOMY     TOTAL KNEE ARTHROPLASTY Right 12/28/2022   Procedure: TOTAL KNEE ARTHROPLASTY;  Surgeon: Cammy Copa, MD;  Location: [facility] OR;  Service: Orthopedics;  Laterality: Right;   TOTAL SHOULDER ARTHROPLASTY Left 12/21/2016   Procedure: TOTAL SHOULDER ARTHROPLASTY;  Surgeon: Cammy Copa, MD;  Location: [facility] OR;  Service: Orthopedics;  Laterality: Left;   TUBAL LIGATION     umbillical hernia     Patient Active Problem List   Diagnosis Date Noted   OA (osteoarthritis) of knee 12/28/2022   S/P total knee arthroplasty, right 12/28/2022   Low vitamin D level 11/01/2022   Leg swelling 06/28/2022   Giardiasis 08/23/2021   Tapeworm infection 08/23/2021   Leg pain, left 11/17/2020   Alopecia 05/06/2020   History of Helicobacter pylori infection 04/09/2020   Anemia  04/09/2020   History of adenomatous polyp of colon 04/09/2020   Gastric ulcer 01/29/2020   Duodenal ulcer 01/29/2020   Sacro ilial pain 11/08/2019   Chronic back pain 09/11/2019   Immunization refused 03/11/2019   Encounter for annual physical exam 07/10/2015   Cutaneous wart 09/30/2014   Hyperlipidemia with target LDL less than 100 05/04/2012   GERD (gastroesophageal reflux disease) 03/14/2011   Chronic right-sided low back pain with right-sided sciatica 06/25/2009   Morbid obesity (HCC) 02/17/2007   Hypothyroidism 06/14/2006   Diverticulosis of colon 03/01/2006   HIATAL HERNIA WITH REFLUX, HX OF 03/01/2006    PCP: Syliva Overman MD  REFERRING PROVIDER:   Cammy Copa, MD    REFERRING DIAG: M17.11 (ICD-10-CM) - Unilateral primary osteoarthritis, right knee   THERAPY DIAG:  Impaired functional mobility, balance, gait, and endurance  Decreased range of motion (ROM) of right knee  Arthritis of right knee  Rationale for Evaluation and Treatment: Rehabilitation  ONSET DATE: 12/28/22  SUBJECTIVE:   SUBJECTIVE STATEMENT: Patient felt it was difficult to move her leg while lying on her left side for the first time. This morning she got her foot stuck between her car and the curb. Felt sharp pain 10/10; could cry. Concerned she hurt something but feels better now in the clinic 7/10.  EVAL: Coming in for R TKA. 17th of this Month. Pt received home health PT. Pt reports getting around the house just fine, badges are starting to come off. Pt lives with daughter/son and grandchildren. Before the knee replacement, pt was independent with functional mobility and ADLs. Pt currently utilizing AD for ambulation, not driving currently. Assist from daugther for transfers into tub. Pt known to this clinc with prior TSA.   PERTINENT HISTORY: Lumbar fusion PAIN:  Are you having pain? Yes: NPRS scale: 5/10 Pain location: R knee Pain description: dull pain Aggravating factors:  nothing Relieving factors: pain medications, ice  PRECAUTIONS: None  RED FLAGS: None   WEIGHT BEARING RESTRICTIONS: No  FALLS:  Has patient fallen in last 6 months? No  LIVING ENVIRONMENT: Lives with: lives with their family Lives in: House/apartment Stairs: Yes: External: 4 steps; can reach both Has following equipment at home: Dan Humphreys - 2 wheeled  OCCUPATION: Retired  PLOF: Independent  PATIENT GOALS: "go get grand-baby at school"  NEXT MD VISIT: 02/07/23  OBJECTIVE:  Note: Objective measures were completed at Evaluation unless otherwise noted.  DIAGNOSTIC FINDINGS:   PATIENT SURVEYS:  FOTO 50.1%  COGNITION: Overall cognitive status: Within functional limits for tasks assessed     SENSATION: WFL  POSTURE: No Significant postural limitations  PALPATION: Not tender to Palpation  LOWER EXTREMITY ROM:  Active ROM Right eval Left eval Right 02/01/23 Right 02/10/23 Right 02/22/23 Right 03/07/23   Hip flexion        Hip extension  Hip abduction        Hip adduction        Hip internal rotation        Hip external rotation        Knee flexion 37 110 60, 63 AAROM 75 77 85  Knee extension -5 0  -3 (lacking) -3 lacking -3   Ankle dorsiflexion        Ankle plantarflexion        Ankle inversion        Ankle eversion         (Blank rows = not tested)  LOWER EXTREMITY MMT:  MMT Right eval Left eval Right 02/22/23  Hip flexion   4  Hip extension   2+  Hip abduction   4  Hip adduction     Hip internal rotation     Hip external rotation     Knee flexion   2+ limited range  Knee extension  4- 4-  Ankle dorsiflexion     Ankle plantarflexion     Ankle inversion     Ankle eversion      (Blank rows = not tested)  FUNCTIONAL TESTS:  30 seconds chair stand test: 8x 2 minute walk test: 248ft w/ RW  02/22/23: no AD: 386 30 seconds chair stand test: 13x  GAIT: Distance walked: 260ft Assistive device utilized: Walker - 2 wheeled Level  of assistance: Modified independence Comments: Mild R circumduction during swing, limited R knee flexion during swing. Mild antalgic gait pattern with RLE   TODAY'S TREATMENT:                                                                                                                              DATE:   03/07/2023 Started with moist heat over knee 5' SLR x15 with quad set STM and man ROM to right knee in supine and sitting with contract relax x10' to improve soft tissue extensibility and right knee mobility. Manual distraction at the end. LAQ with manual resistance 2x10 Standing hip ABD at counter 2x10 Heel raises to fatigue 2x Knee drives on 6" step 5x10" hold in parallel bars  Forward step ups 4" x15 Mini squats x15   03/03/2023 Started with moist heat over knee 5' STM and man ROM to right knee in supine and sitting with contract relax x10' to improve soft tissue extensibility and right knee mobility. Manual distraction at the end. LAQ with manual resistance 2x10 Heel raises with toes on incline 2x20 Knee drives on 4" step 10x5" hold in parallel bars - ROM measurement in standing 78 Lateral step up on 4" step 2x10, 1 UE support  03/01/2023 STM and man ROM to right knee in supine and sitting with contract relax x15' to improve soft tissue extensibility and right knee mobility SAQ with manual resistance 2x10 SAQ with SLR 2 x 10 Knee drives on 4" step 5x10" hold in parallel bars Step ups x 15  on 4" Bike seat 8 x 3' for knee ROM half revolutions  02/24/2023 Supine: STM and manual ROM right knee for 25' for right knee flexion and extension; seated contract relax for quads and hamstrings Supine -2 to 78; sitting knee flexion AAROM 82 degrees Standing: Knee drives on 7" step for flexion  2' Bike seat 8 x 3' for knee ROM half revolutions  02/22/23: Seated: knee hang with AAROM 5x 20" FOTO: 76% STM and retro grade massage for edema control x with LE elevated MMT See  above Prone: Quad stretch with rope 3x 30" : 386 30 seconds chair stand test: 13x Knee drive on 2nd step (53GU) AROM 3-77 degrees  02/17/2023 STM and man ROM to right knee in supine and sitting with contract relax x15' to improve soft tissue extensibility and right knee mobility Supine SLR 2 x until fatigue SAQ 2 x until fatigue  Modified thomas stretch 2x30 sec paired with x15 LAQ between sets Mini squats to elevated mat 3x15    02/15/23 STM and man ROM to right knee in supine and sitting with contract relax x15' to improve soft tissue extensibility and right knee mobility Heel slides AAROM with strap 3' Squats to elevated mat 2x15;  Heel raises 2x15 with hand support Education about exercise modification, scar massage, and mobility at home  Measured knee flexion in supine at EOS. 80 AROM, 83 AAROM   02/10/2023 STM and man ROM to right knee in supine and sitting with contract relax x 10' to improve soft tissue extensibility and right knee mobility Standing: 12" box Right knee drives for flexion x 2' Heel raises 2 x 10 Squats 2 x 10 Nustep seat 8 arms 10 for mobility x 5' AROM right knee in supine -3 to 75 Seated heel slides x 5 with overpressure from left leg   02/08/23 Manual in supine. Retrograde massage over quads, hamstrings, and calf. Patella mobs all directions. Tender to palpation over later joint line and medial hamstring insertion.  Supine:  Quad sets 10x5"  TKE 15 x 5" on half bolster (Short arc quad)  SLR with quad set prior raise 10x  Heel Slides x 10 with rope  PROM: knee ext: -3 degrees Standing:  Knee drive for flexion on 8in step height 5x 20"  Step ups on 4in step height 20x Step navigation (up 8in and down 4in) Gait training with and without [facility]   02/03/23: Gait training with SPC, cueing for UE and sequence with initial 3 point step to to 2 point step thru 2RT inside // bars for heel strike, swing through, toe push off, knee flexion  Manual:   Supine with LE elevated  Retrograde massage  Patella mobs all directions Supine:  Quad sets 10x 5"  TKE 15 x 5" on half bolster  SLR with quad set prior raise 10x  Heel slide 10x 5-10" holds at end range   AROM 4- 68 degrees Seated: AAROM heel slide 10x 10" Standing:  Knee drive for flexion on 8in step height 5x 20" Bike seat 10 rocking x 5'  02/01/23 Seated: Leg dangle for flexion Calf/hamstring stretch with strap 2 x 30" Supine: STM to right knee to increase soft tissue mobility and joint ROM x 10' AROM right knee flexion 60 degrees; 63 AAROM SAQ x 10 SAQ into SLR  x 10  Standing: Knee drives for flexion x 2' on 7" step Heel toe raises x 10   01/27/2023  -Seated knee flexion active stretch 5  x 30'' hold- cues for reduced trunk lean compensation. -Heel slides with a strap x 10 x 3 with 30" hold on flexion  -Supine R SLR x 15 with tactile cues at distal foot for improved knee extesion -Seated LAQ 10 x 3  -Standing weight shifts x 10 bilaterally with symmetrical foot placement tactile cues at hips within Rw.  -Elevated sit/stands with even foot placement and no UE support 2 x 10- 6/10 pain. Cues for symmetry.   01/25/23 NuStep, seat 10 , arm 8, level 1 x 5' Heel slides with a strap x 10 x 3 with 30" hold on flexion on the last rep Supine R SLR x 10 x 2 R LAQ x 3" x 10 x 2 Heel/toe raises with UE support x 10 x 2 Standing hip abd/ext  with UE support x 10 x 2   01/20/2023 PT Evaluation, findings, HEP, prognosis, and frequency   PATIENT EDUCATION:  Education details: see below Person educated: Patient Education method: Explanation and Demonstration Education comprehension: verbalized understanding  HOME EXERCISE PROGRAM: Access Code: [email or web address] URL: [email or web address] 01/25/2023 - Supine Active Straight Leg Raise  - 1-2 x daily - 7 x weekly - 2 sets - 10 reps - Heel Toe Raises with Counter Support  - 1-2 x daily - 7 x weekly - 2 sets - 10 reps -  Seated Long Arc Quad  - 1-2 x daily - 7 x weekly - 2 sets - 10 reps - 3 hold - Standing Hip Extension with Counter Support  - 1-2 x daily - 7 x weekly - 2 sets - 10 reps - Standing Hip Abduction with Counter Support  - 1-2 x daily - 7 x weekly - 2 sets - 10 reps  Date: 01/20/2023 Prepared by: Starling Manns  Exercises - Supine Heel Slide  - 1 x daily - 7 x weekly - 3 sets - 10 reps - Seated Knee Flexion Extension AROM   - 1 x daily - 7 x weekly - 3 sets - 10 reps - Supine Gluteal Sets  - 1 x daily - 7 x weekly - 3 sets - 10 reps - Supine Knee Extension Stretch on Towel Roll  - 1 x daily - 7 x weekly - 3 sets - 10 reps  ASSESSMENT:  CLINICAL IMPRESSION: Today's session focused on improving knee flexion ROM, end range quad control and strength. Moist heat and STM and contract-relax interventions allowed for an increase in knee flexion PROM. Scar mobilizations to distal quad continue to be beneficial, but patient is notably restricted with patellar mobilizations, especially superior and inferior glides. Patient also demonstrates poor coordination with standing exercises and during gait. During knee drives patient needed min to mod verbal and tactile cues; she demonstrated improved control and form. Following today's interventions, patients flexion AROM in supine measured at 85 degrees. Showing good improvement with mobility despite tweaking her knee this morning. Patient was able tolerate exercises without an increase in pain and showed improved ROM. Patient will continue to benefit from skilled intervention to address unmet goals, promote return to optimal function, and increase knee ROM.       OBJECTIVE IMPAIRMENTS: Abnormal gait, decreased activity tolerance, decreased balance, decreased mobility, difficulty walking, decreased ROM, decreased strength, hypomobility, increased fascial restrictions, impaired flexibility, and pain.   ACTIVITY LIMITATIONS: carrying, lifting, bending, sitting,  standing, squatting, stairs, transfers, and locomotion level  PARTICIPATION LIMITATIONS: driving, shopping, community activity, occupation, and yard work  PERSONAL FACTORS: Age are also affecting patient's functional  outcome.   REHAB POTENTIAL: Good  CLINICAL DECISION MAKING: Stable/uncomplicated  EVALUATION COMPLEXITY: Low   GOALS: Goals reviewed with patient? No  SHORT TERM GOALS: Target date: 11/7/024  Pt will be independent with HEP in order to demonstrate participation in Physical Therapy POC.  Baseline:  Reports compliance with HEP Goal status: MET  2.  Pt will report 3/10 pain improvement during functional activity n order to demonstrate improved pain with functional activities.  Baseline: Reports increased pain with stretches 7/10 Goal status: IN PROGRESS  LONG TERM GOALS: Target date: 03/17/2023  Pt will improve 30 second chair test by > 2 reps in order to demonstrate improved functional strength to return to desired activities.  Baseline: 8x; 02/22/23: 13x no HHA Goal status: MET  2.  Pt will improve 2 MWT by >130ft with LRAD in order to demonstrate improved functional ambulatory capacity in community setting.  Baseline: 29ft with RW; 02/22/23: 386 ft no AD Goal status: MET  3.  Pt will improve FOTO score by > 5 points in order to demonstrate improved pain with functional goals and outcomes. Baseline: 50.1 02/22/23: 76% Goal status: MET  4.  Pt will improve R knee extension ROM to 0 degrees in order to improve knee mobility during functional activities. Baseline: -5 degrees from 0; 02/22/23: AROM 3-77 degrees Goal status: IN PROGRESS  5.  Pt will improve R knee flexion ROM to >110 degrees in order to improve knee mobility during functional activities. Baseline: 45 degrees currently; 02/22/23: AROM 3-77 degrees Goal status: IN PROGRESS    PLAN:   PT FREQUENCY: 2x/week  PT DURATION: 8 weeks  PLANNED INTERVENTIONS: 97146- PT Re-evaluation,  97110-Therapeutic exercises, 97530- Therapeutic activity, 97112- Neuromuscular re-education, 97535- Self Care, 16109- Manual therapy, [phone]- Gait training, Patient/Family education, Balance training, Stair training, and Joint mobilization  PLAN FOR NEXT SESSION: Continue POC and progress as tolerated with emphasis on R knee AROM & PROM (especially flexion), strength as well as hip strengthening. When appropriate, add standing balance and gait exercises to improve functional activities.    8:03 AM, 03/07/23 Karna Christmas, SPT  " I agree with the following treatment note after reviewing documentation. This session was performed under the supervision of a licensed clinician."    10:26 AM, 03/07/23 Amy Small Lynch MPT [facility] [place] [phone]

## 2023-03-07 NOTE — Telephone Encounter (Signed)
Patient called needing a refill on hydromorphone 2mg . CB#[phone] It needs to sent [facility] in [place].

## 2023-03-08 ENCOUNTER — Other Ambulatory Visit: Payer: Self-pay | Admitting: Surgical

## 2023-03-08 ENCOUNTER — Ambulatory Visit (HOSPITAL_COMMUNITY): Payer: Medicare HMO

## 2023-03-08 ENCOUNTER — Telehealth: Payer: Self-pay | Admitting: Surgical

## 2023-03-08 ENCOUNTER — Encounter (HOSPITAL_BASED_OUTPATIENT_CLINIC_OR_DEPARTMENT_OTHER): Payer: Self-pay

## 2023-03-08 DIAGNOSIS — M1711 Unilateral primary osteoarthritis, right knee: Secondary | ICD-10-CM

## 2023-03-08 DIAGNOSIS — M25661 Stiffness of right knee, not elsewhere classified: Secondary | ICD-10-CM | POA: Diagnosis not present

## 2023-03-08 DIAGNOSIS — Z7409 Other reduced mobility: Secondary | ICD-10-CM | POA: Diagnosis not present

## 2023-03-08 MED ORDER — HYDROMORPHONE HCL 2 MG PO TABS: 1.0000 mg | ORAL_TABLET | Freq: Two times a day (BID) | ORAL | 0 refills | Status: DC | PRN

## 2023-03-08 NOTE — Telephone Encounter (Signed)
Advised pt about weaning off. She stated understanding

## 2023-03-08 NOTE — Therapy (Addendum)
OUTPATIENT PHYSICAL THERAPY LOWER EXTREMITY TREATMENT     Patient Name: Victoria Reyes MRN: [id number] DOB:March 06, 1951, 72 y.o., female Today's Date: 03/08/2023  END OF SESSION:  PT End of Session - 03/08/23 0804     Visit Number 15    Number of Visits 20    Date for PT Re-Evaluation 03/17/23    Authorization Type [employer] medicare (12 visits approved)    Authorization Time Period 01/20/23-03/17/23    Authorization - Number of Visits 12    Progress Note Due on Visit 20    PT Start Time 0804    PT Stop Time 0845    PT Time Calculation (min) 41 min    Activity Tolerance Patient tolerated treatment well    Behavior During Therapy [facility] for tasks assessed/performed              Past Medical History:  Diagnosis Date   Anemia    as a  young woman   Diverticulitis 2005   CT    DJD (degenerative joint disease)    GERD (gastroesophageal reflux disease)    H. pylori infection 01/2020   S/p treatment with omeprazole, amoxicillin, and Biaxin.   Headache(784.0)    Hyperlipidemia    Hypothyroidism    Knee pain, right    Low back pain    Neuropathy    peroneal nerve   Neuropathy    left lower leg from back surgery per patient   Obesity    Pneumonia    as child   S/P colonoscopy 2005   Dr. Katrinka Blazing: sigmoid diverticulosis   Thyroid disease    Tobacco abuse    Past Surgical History:  Procedure Laterality Date   BACK SURGERY     BIOPSY  01/29/2020   Procedure: BIOPSY;  Surgeon: Dolores Frame, MD;  Location: [facility];  Service: Gastroenterology;;   BIOPSY  05/13/2020   Procedure: BIOPSY;  Surgeon: Lanelle Bal, DO;  Location: [facility] ENDOSCOPY;  Service: Endoscopy;;   COLONOSCOPY  12/18/2010   Procedure: COLONOSCOPY;  Surgeon: Arlyce Harman, MD; 3 mm sessile polyp at hepatic flexure, pancolonic diverticulosis, internal hemorrhoids, anal lesion just above the dentate line biopsied via cold forceps.  Colonic polyp was a tubular adenoma.  Anus biopsy with mild  active inflammation associated with reactive epithelial changes and hemorrhage.  Recommended repeat colonoscopy in 2022.   COLONOSCOPY WITH PROPOFOL N/A 05/13/2020   Procedure: COLONOSCOPY WITH PROPOFOL;  Surgeon: Lanelle Bal, DO;  Location: [facility] ENDOSCOPY;  Service: Endoscopy;  Laterality: N/A;  12:30PM   cyst removal right wrist     ESOPHAGOGASTRODUODENOSCOPY (EGD) WITH PROPOFOL N/A 01/29/2020   Procedure: ESOPHAGOGASTRODUODENOSCOPY (EGD) WITH PROPOFOL;  Surgeon: Marguerita Merles, Reuel Boom, MD;   3 gastric ulcers with clean base, 5 ulcers in the duodenal bulb and first portion of duodenum, 2 were noted to be oozing, treated with bipolar cautery.  Gastric biopsies consistent with H. pylori gastritis.   ESOPHAGOGASTRODUODENOSCOPY (EGD) WITH PROPOFOL N/A 05/13/2020   Procedure: ESOPHAGOGASTRODUODENOSCOPY (EGD) WITH PROPOFOL;  Surgeon: Lanelle Bal, DO;  Location: [facility] ENDOSCOPY;  Service: Endoscopy;  Laterality: N/A;   EYE SURGERY Bilateral    cataract surgery with lens implant   POLYPECTOMY  05/13/2020   Procedure: POLYPECTOMY;  Surgeon: Lanelle Bal, DO;  Location: [facility] ENDOSCOPY;  Service: Endoscopy;;   TONSILLECTOMY     TOTAL KNEE ARTHROPLASTY Right 12/28/2022   Procedure: TOTAL KNEE ARTHROPLASTY;  Surgeon: Cammy Copa, MD;  Location: [facility] OR;  Service: Orthopedics;  Laterality: Right;   TOTAL SHOULDER ARTHROPLASTY Left 12/21/2016   Procedure: TOTAL SHOULDER ARTHROPLASTY;  Surgeon: Cammy Copa, MD;  Location: [facility] OR;  Service: Orthopedics;  Laterality: Left;   TUBAL LIGATION     umbillical hernia     Patient Active Problem List   Diagnosis Date Noted   OA (osteoarthritis) of knee 12/28/2022   S/P total knee arthroplasty, right 12/28/2022   Low vitamin D level 11/01/2022   Leg swelling 06/28/2022   Giardiasis 08/23/2021   Tapeworm infection 08/23/2021   Leg pain, left 11/17/2020   Alopecia 05/06/2020   History of Helicobacter pylori infection 04/09/2020   Anemia  04/09/2020   History of adenomatous polyp of colon 04/09/2020   Gastric ulcer 01/29/2020   Duodenal ulcer 01/29/2020   Sacro ilial pain 11/08/2019   Chronic back pain 09/11/2019   Immunization refused 03/11/2019   Encounter for annual physical exam 07/10/2015   Cutaneous wart 09/30/2014   Hyperlipidemia with target LDL less than 100 05/04/2012   GERD (gastroesophageal reflux disease) 03/14/2011   Chronic right-sided low back pain with right-sided sciatica 06/25/2009   Morbid obesity (HCC) 02/17/2007   Hypothyroidism 06/14/2006   Diverticulosis of colon 03/01/2006   HIATAL HERNIA WITH REFLUX, HX OF 03/01/2006    PCP: Syliva Overman MD  REFERRING PROVIDER:   Cammy Copa, MD    REFERRING DIAG: M17.11 (ICD-10-CM) - Unilateral primary osteoarthritis, right knee   THERAPY DIAG:  Arthritis of right knee  Impaired functional mobility, balance, gait, and endurance  Decreased range of motion (ROM) of right knee  Rationale for Evaluation and Treatment: Rehabilitation  ONSET DATE: 12/28/22  SUBJECTIVE:   SUBJECTIVE STATEMENT: Patient is feeling good today after PT yesterday. Noticed some swelling in the afternoon. Was able to ice her knee and felt better.   EVAL: Coming in for R TKA. 17th of this Month. Pt received home health PT. Pt reports getting around the house just fine, badges are starting to come off. Pt lives with daughter/son and grandchildren. Before the knee replacement, pt was independent with functional mobility and ADLs. Pt currently utilizing AD for ambulation, not driving currently. Assist from daugther for transfers into tub. Pt known to this clinc with prior TSA.   PERTINENT HISTORY: Lumbar fusion PAIN:  Are you having pain? Yes: NPRS scale: 5/10 Pain location: R knee Pain description: dull pain Aggravating factors: nothing Relieving factors: pain medications, ice  PRECAUTIONS: None  RED FLAGS: None   WEIGHT BEARING RESTRICTIONS:  No  FALLS:  Has patient fallen in last 6 months? No  LIVING ENVIRONMENT: Lives with: lives with their family Lives in: House/apartment Stairs: Yes: External: 4 steps; can reach both Has following equipment at home: Dan Humphreys - 2 wheeled  OCCUPATION: Retired  PLOF: Independent  PATIENT GOALS: "go get grand-baby at school"  NEXT MD VISIT: 02/07/23  OBJECTIVE:  Note: Objective measures were completed at Evaluation unless otherwise noted.  DIAGNOSTIC FINDINGS:   PATIENT SURVEYS:  FOTO 50.1%  COGNITION: Overall cognitive status: Within functional limits for tasks assessed     SENSATION: WFL  POSTURE: No Significant postural limitations  PALPATION: Not tender to Palpation  LOWER EXTREMITY ROM:  Active ROM Right eval Left eval Right 02/01/23 Right 02/10/23 Right 02/22/23 Right 03/07/23   Hip flexion        Hip extension        Hip abduction        Hip adduction        Hip internal rotation  Hip external rotation        Knee flexion 37 110 60, 63 AAROM 75 77 85  Knee extension -5 0  -3 (lacking) -3 lacking -3   Ankle dorsiflexion        Ankle plantarflexion        Ankle inversion        Ankle eversion         (Blank rows = not tested)  LOWER EXTREMITY MMT:  MMT Right eval Left eval Right 02/22/23  Hip flexion   4  Hip extension   2+  Hip abduction   4  Hip adduction     Hip internal rotation     Hip external rotation     Knee flexion   2+ limited range  Knee extension  4- 4-  Ankle dorsiflexion     Ankle plantarflexion     Ankle inversion     Ankle eversion      (Blank rows = not tested)  FUNCTIONAL TESTS:  30 seconds chair stand test: 8x 2 minute walk test: 221ft w/ RW  02/22/23: no AD: 386 30 seconds chair stand test: 13x  GAIT: Distance walked: 256ft Assistive device utilized: Walker - 2 wheeled Level of assistance: Modified independence Comments: Mild R circumduction during swing, limited R knee flexion during swing. Mild  antalgic gait pattern with RLE   TODAY'S TREATMENT:                                                                                                                              DATE:   03/08/2023 Supine: STM and manual ROM to right knee in supine; patellar glides in every direction  SAQ with manual resistance 3 sets 5x3" hold Sitting with contract relax x10' to improve soft tissue extensibility and right knee mobility. Manual distraction at the end. LAQ with manual resistance 2x10 Sit to stand / minisquat to mat 2x15 Knee drives on 6" step 5x10" hold in parallel bars  Gait training 3'  03/07/2023 Started with moist heat over knee 5' SLR x15 with quad set STM and manual ROM to right knee in supine and sitting with contract relax x10' to improve soft tissue extensibility and right knee mobility. Manual distraction at the end. LAQ with manual resistance 2x10 Standing hip ABD at counter 2x10 Heel raises to fatigue 2x Knee drives on 6" step 5x10" hold in parallel bars  Forward step ups 4" x15 Mini squats x15   03/03/2023 Started with moist heat over knee 5' STM and man ROM to right knee in supine and sitting with contract relax x10' to improve soft tissue extensibility and right knee mobility. Manual distraction at the end. LAQ with manual resistance 2x10 Heel raises with toes on incline 2x20 Knee drives on 4" step 10x5" hold in parallel bars - ROM measurement in standing 78 Lateral step up on 4" step 2x10, 1 UE support  03/01/2023 STM and man ROM to  right knee in supine and sitting with contract relax x15' to improve soft tissue extensibility and right knee mobility SAQ with manual resistance 2x10 SAQ with SLR 2 x 10 Knee drives on 4" step 5x10" hold in parallel bars Step ups x 15 on 4" Bike seat 8 x 3' for knee ROM half revolutions  02/24/2023 Supine: STM and manual ROM right knee for 25' for right knee flexion and extension; seated contract relax for quads and  hamstrings Supine -2 to 78; sitting knee flexion AAROM 82 degrees Standing: Knee drives on 7" step for flexion  2' Bike seat 8 x 3' for knee ROM half revolutions  02/22/23: Seated: knee hang with AAROM 5x 20" FOTO: 76% STM and retro grade massage for edema control x with LE elevated MMT See above Prone: Quad stretch with rope 3x 30" : 386 30 seconds chair stand test: 13x Knee drive on 2nd step (29FA) AROM 3-77 degrees  02/17/2023 STM and man ROM to right knee in supine and sitting with contract relax x15' to improve soft tissue extensibility and right knee mobility Supine SLR 2 x until fatigue SAQ 2 x until fatigue  Modified thomas stretch 2x30 sec paired with x15 LAQ between sets Mini squats to elevated mat 3x15    02/15/23 STM and man ROM to right knee in supine and sitting with contract relax x15' to improve soft tissue extensibility and right knee mobility Heel slides AAROM with strap 3' Squats to elevated mat 2x15;  Heel raises 2x15 with hand support Education about exercise modification, scar massage, and mobility at home  Measured knee flexion in supine at EOS. 80 AROM, 83 AAROM   02/10/2023 STM and man ROM to right knee in supine and sitting with contract relax x 10' to improve soft tissue extensibility and right knee mobility Standing: 12" box Right knee drives for flexion x 2' Heel raises 2 x 10 Squats 2 x 10 Nustep seat 8 arms 10 for mobility x 5' AROM right knee in supine -3 to 75 Seated heel slides x 5 with overpressure from left leg   02/08/23 Manual in supine. Retrograde massage over quads, hamstrings, and calf. Patella mobs all directions. Tender to palpation over later joint line and medial hamstring insertion.  Supine:  Quad sets 10x5"  TKE 15 x 5" on half bolster (Short arc quad)  SLR with quad set prior raise 10x  Heel Slides x 10 with rope  PROM: knee ext: -3 degrees Standing:  Knee drive for flexion on 8in step height 5x 20"  Step  ups on 4in step height 20x Step navigation (up 8in and down 4in) Gait training with and without [facility]   02/03/23: Gait training with SPC, cueing for UE and sequence with initial 3 point step to to 2 point step thru 2RT inside // bars for heel strike, swing through, toe push off, knee flexion  Manual:  Supine with LE elevated  Retrograde massage  Patella mobs all directions Supine:  Quad sets 10x 5"  TKE 15 x 5" on half bolster  SLR with quad set prior raise 10x  Heel slide 10x 5-10" holds at end range   AROM 4- 68 degrees Seated: AAROM heel slide 10x 10" Standing:  Knee drive for flexion on 8in step height 5x 20" Bike seat 10 rocking x 5'  02/01/23 Seated: Leg dangle for flexion Calf/hamstring stretch with strap 2 x 30" Supine: STM to right knee to increase soft tissue mobility and joint ROM x  10' AROM right knee flexion 60 degrees; 63 AAROM SAQ x 10 SAQ into SLR  x 10  Standing: Knee drives for flexion x 2' on 7" step Heel toe raises x 10   01/27/2023  -Seated knee flexion active stretch 5 x 30'' hold- cues for reduced trunk lean compensation. -Heel slides with a strap x 10 x 3 with 30" hold on flexion  -Supine R SLR x 15 with tactile cues at distal foot for improved knee extesion -Seated LAQ 10 x 3  -Standing weight shifts x 10 bilaterally with symmetrical foot placement tactile cues at hips within Rw.  -Elevated sit/stands with even foot placement and no UE support 2 x 10- 6/10 pain. Cues for symmetry.   01/25/23 NuStep, seat 10 , arm 8, level 1 x 5' Heel slides with a strap x 10 x 3 with 30" hold on flexion on the last rep Supine R SLR x 10 x 2 R LAQ x 3" x 10 x 2 Heel/toe raises with UE support x 10 x 2 Standing hip abd/ext  with UE support x 10 x 2   01/20/2023 PT Evaluation, findings, HEP, prognosis, and frequency   PATIENT EDUCATION:  Education details: see below Person educated: Patient Education method: Explanation and Demonstration Education  comprehension: verbalized understanding  HOME EXERCISE PROGRAM: Access Code: [email or web address] URL: [email or web address] 01/25/2023 - Supine Active Straight Leg Raise  - 1-2 x daily - 7 x weekly - 2 sets - 10 reps - Heel Toe Raises with Counter Support  - 1-2 x daily - 7 x weekly - 2 sets - 10 reps - Seated Long Arc Quad  - 1-2 x daily - 7 x weekly - 2 sets - 10 reps - 3 hold - Standing Hip Extension with Counter Support  - 1-2 x daily - 7 x weekly - 2 sets - 10 reps - Standing Hip Abduction with Counter Support  - 1-2 x daily - 7 x weekly - 2 sets - 10 reps  Date: 01/20/2023 Prepared by: Starling Manns  Exercises - Supine Heel Slide  - 1 x daily - 7 x weekly - 3 sets - 10 reps - Seated Knee Flexion Extension AROM   - 1 x daily - 7 x weekly - 3 sets - 10 reps - Supine Gluteal Sets  - 1 x daily - 7 x weekly - 3 sets - 10 reps - Supine Knee Extension Stretch on Towel Roll  - 1 x daily - 7 x weekly - 3 sets - 10 reps  ASSESSMENT:  CLINICAL IMPRESSION: Today's session focused on improving knee flexion ROM, end range quad control and strength. Patient denied moist heat today. STM and contract-relax interventions allowed for improved knee flexion PROM and AROM. Scar mobilizations to distal quad continue to be beneficial, especially during SAQ and LAQ exercises. Following today's interventions, gait training demonstrated symmetrical step length with mild lateral trunk sway consistent with a reverse trendelenburg. Introducing lateral stepping and hip exercises will help address these gait disturbances. Patient showing good improvement with mobility and self management. Patient was able tolerate exercises without an increase in pain. Patient will continue to benefit from skilled intervention to address unmet goals, promote return to optimal function, and increase knee ROM.       OBJECTIVE IMPAIRMENTS: Abnormal gait, decreased activity tolerance, decreased balance, decreased mobility,  difficulty walking, decreased ROM, decreased strength, hypomobility, increased fascial restrictions, impaired flexibility, and pain.   ACTIVITY LIMITATIONS: carrying, lifting, bending, sitting, standing, squatting, stairs, transfers,  and locomotion level  PARTICIPATION LIMITATIONS: driving, shopping, community activity, occupation, and yard work  PERSONAL FACTORS: Age are also affecting patient's functional outcome.   REHAB POTENTIAL: Good  CLINICAL DECISION MAKING: Stable/uncomplicated  EVALUATION COMPLEXITY: Low   GOALS: Goals reviewed with patient? No  SHORT TERM GOALS: Target date: 11/7/024  Pt will be independent with HEP in order to demonstrate participation in Physical Therapy POC.  Baseline:  Reports compliance with HEP Goal status: MET  2.  Pt will report 3/10 pain improvement during functional activity n order to demonstrate improved pain with functional activities.  Baseline: Reports increased pain with stretches 7/10 Goal status: IN PROGRESS  LONG TERM GOALS: Target date: 03/17/2023  Pt will improve 30 second chair test by > 2 reps in order to demonstrate improved functional strength to return to desired activities.  Baseline: 8x; 02/22/23: 13x no HHA Goal status: MET  2.  Pt will improve 2 MWT by >134ft with LRAD in order to demonstrate improved functional ambulatory capacity in community setting.  Baseline: 259ft with RW; 02/22/23: 386 ft no AD Goal status: MET  3.  Pt will improve FOTO score by > 5 points in order to demonstrate improved pain with functional goals and outcomes. Baseline: 50.1 02/22/23: 76% Goal status: MET  4.  Pt will improve R knee extension ROM to 0 degrees in order to improve knee mobility during functional activities. Baseline: -5 degrees from 0; 02/22/23: AROM 3-77 degrees Goal status: IN PROGRESS  5.  Pt will improve R knee flexion ROM to >110 degrees in order to improve knee mobility during functional activities. Baseline: 45  degrees currently; 02/22/23: AROM 3-77 degrees Goal status: IN PROGRESS    PLAN:   PT FREQUENCY: 2x/week  PT DURATION: 8 weeks  PLANNED INTERVENTIONS: 97146- PT Re-evaluation, 97110-Therapeutic exercises, 97530- Therapeutic activity, 97112- Neuromuscular re-education, 97535- Self Care, 16109- Manual therapy, [phone]- Gait training, Patient/Family education, Balance training, Stair training, and Joint mobilization  PLAN FOR NEXT SESSION: Continue POC and progress as tolerated with emphasis on R knee AROM & PROM (especially flexion); Introduce more hip strengthening. When appropriate, add standing balance and gait exercises to improve functional activities.    8:05 AM, 03/08/23 Karna Christmas, SPT  " I agree with the following treatment note after reviewing documentation. This session was performed under the supervision of a licensed clinician."    8:05 AM, 03/08/23 Amy Small Lynch MPT [place] [facility] [place] [phone]

## 2023-03-08 NOTE — Telephone Encounter (Signed)
Sent in

## 2023-03-08 NOTE — Telephone Encounter (Signed)
Sent in refill with reduced dose down to 1mg .  Working on tapering off, this should probably be the last refill at this point

## 2023-03-08 NOTE — Telephone Encounter (Signed)
Called and advised pt.

## 2023-03-08 NOTE — Telephone Encounter (Signed)
Pt called requesting for hydromorphone to go to [facility] on [address] Pt states [facility] is out of stock. Please resend ASAP. Pt phone number is [phone].

## 2023-03-14 ENCOUNTER — Ambulatory Visit (HOSPITAL_COMMUNITY)
Admission: RE | Admit: 2023-03-14 | Discharge: 2023-03-14 | Disposition: A | Discharge status: Home / Self Care | Payer: Medicare HMO | Source: Ambulatory Visit | Attending: Family Medicine | Admitting: Family Medicine

## 2023-03-14 ENCOUNTER — Encounter (HOSPITAL_COMMUNITY): Payer: Self-pay

## 2023-03-14 ENCOUNTER — Ambulatory Visit (HOSPITAL_COMMUNITY): Payer: Medicare HMO | Attending: Orthopedic Surgery

## 2023-03-14 DIAGNOSIS — Z7409 Other reduced mobility: Secondary | ICD-10-CM | POA: Insufficient documentation

## 2023-03-14 DIAGNOSIS — M25661 Stiffness of right knee, not elsewhere classified: Secondary | ICD-10-CM | POA: Diagnosis not present

## 2023-03-14 DIAGNOSIS — Z1231 Encounter for screening mammogram for malignant neoplasm of breast: Secondary | ICD-10-CM | POA: Insufficient documentation

## 2023-03-14 DIAGNOSIS — M1711 Unilateral primary osteoarthritis, right knee: Secondary | ICD-10-CM | POA: Insufficient documentation

## 2023-03-14 NOTE — Therapy (Addendum)
OUTPATIENT PHYSICAL THERAPY LOWER EXTREMITY TREATMENT     Patient Name: Victoria Reyes MRN: [id number] DOB:1950-08-20, 72 y.o., female Today's Date: 03/14/2023  END OF SESSION:  PT End of Session - 03/14/23 0932     Visit Number 16    Number of Visits 20    Date for PT Re-Evaluation 03/17/23    Authorization Type [employer] medicare (12 visits approved)    Authorization Time Period 01/20/23-03/17/23    Authorization - Number of Visits 12    Progress Note Due on Visit 20    PT Start Time 0932    PT Stop Time 1015    PT Time Calculation (min) 43 min    Activity Tolerance Patient tolerated treatment well    Behavior During Therapy [facility] for tasks assessed/performed              Past Medical History:  Diagnosis Date   Anemia    as a  young woman   Diverticulitis 2005   CT    DJD (degenerative joint disease)    GERD (gastroesophageal reflux disease)    H. pylori infection 01/2020   S/p treatment with omeprazole, amoxicillin, and Biaxin.   Headache(784.0)    Hyperlipidemia    Hypothyroidism    Knee pain, right    Low back pain    Neuropathy    peroneal nerve   Neuropathy    left lower leg from back surgery per patient   Obesity    Pneumonia    as child   S/P colonoscopy 2005   Dr. Katrinka Blazing: sigmoid diverticulosis   Thyroid disease    Tobacco abuse    Past Surgical History:  Procedure Laterality Date   BACK SURGERY     BIOPSY  01/29/2020   Procedure: BIOPSY;  Surgeon: Dolores Frame, MD;  Location: [facility];  Service: Gastroenterology;;   BIOPSY  05/13/2020   Procedure: BIOPSY;  Surgeon: Lanelle Bal, DO;  Location: [facility] ENDOSCOPY;  Service: Endoscopy;;   COLONOSCOPY  12/18/2010   Procedure: COLONOSCOPY;  Surgeon: Arlyce Harman, MD; 3 mm sessile polyp at hepatic flexure, pancolonic diverticulosis, internal hemorrhoids, anal lesion just above the dentate line biopsied via cold forceps.  Colonic polyp was a tubular adenoma.  Anus biopsy with mild  active inflammation associated with reactive epithelial changes and hemorrhage.  Recommended repeat colonoscopy in 2022.   COLONOSCOPY WITH PROPOFOL N/A 05/13/2020   Procedure: COLONOSCOPY WITH PROPOFOL;  Surgeon: Lanelle Bal, DO;  Location: [facility] ENDOSCOPY;  Service: Endoscopy;  Laterality: N/A;  12:30PM   cyst removal right wrist     ESOPHAGOGASTRODUODENOSCOPY (EGD) WITH PROPOFOL N/A 01/29/2020   Procedure: ESOPHAGOGASTRODUODENOSCOPY (EGD) WITH PROPOFOL;  Surgeon: Marguerita Merles, Reuel Boom, MD;   3 gastric ulcers with clean base, 5 ulcers in the duodenal bulb and first portion of duodenum, 2 were noted to be oozing, treated with bipolar cautery.  Gastric biopsies consistent with H. pylori gastritis.   ESOPHAGOGASTRODUODENOSCOPY (EGD) WITH PROPOFOL N/A 05/13/2020   Procedure: ESOPHAGOGASTRODUODENOSCOPY (EGD) WITH PROPOFOL;  Surgeon: Lanelle Bal, DO;  Location: [facility] ENDOSCOPY;  Service: Endoscopy;  Laterality: N/A;   EYE SURGERY Bilateral    cataract surgery with lens implant   POLYPECTOMY  05/13/2020   Procedure: POLYPECTOMY;  Surgeon: Lanelle Bal, DO;  Location: [facility] ENDOSCOPY;  Service: Endoscopy;;   TONSILLECTOMY     TOTAL KNEE ARTHROPLASTY Right 12/28/2022   Procedure: TOTAL KNEE ARTHROPLASTY;  Surgeon: Cammy Copa, MD;  Location: [facility] OR;  Service: Orthopedics;  Laterality: Right;   TOTAL SHOULDER ARTHROPLASTY Left 12/21/2016   Procedure: TOTAL SHOULDER ARTHROPLASTY;  Surgeon: Cammy Copa, MD;  Location[facility] OR;  Service: Orthopedics;  Laterality: Left;   TUBAL LIGATION     umbillical hernia     Patient Active Problem List   Diagnosis Date Noted   OA (osteoarthritis) of knee 12/28/2022   S/P total knee arthroplasty, right 12/28/2022   Low vitamin D level 11/01/2022   Leg swelling 06/28/2022   Giardiasis 08/23/2021   Tapeworm infection 08/23/2021   Leg pain, left 11/17/2020   Alopecia 05/06/2020   History of Helicobacter pylori infection 04/09/2020   Anemia  04/09/2020   History of adenomatous polyp of colon 04/09/2020   Gastric ulcer 01/29/2020   Duodenal ulcer 01/29/2020   Sacro ilial pain 11/08/2019   Chronic back pain 09/11/2019   Immunization refused 03/11/2019   Encounter for annual physical exam 07/10/2015   Cutaneous wart 09/30/2014   Hyperlipidemia with target LDL less than 100 05/04/2012   GERD (gastroesophageal reflux disease) 03/14/2011   Chronic right-sided low back pain with right-sided sciatica 06/25/2009   Morbid obesity (HCC) 02/17/2007   Hypothyroidism 06/14/2006   Diverticulosis of colon 03/01/2006   HIATAL HERNIA WITH REFLUX, HX OF 03/01/2006    PCP: Syliva Overman MD  REFERRING PROVIDER:   Cammy Copa, MD    REFERRING DIAG: M17.11 (ICD-10-CM) - Unilateral primary osteoarthritis, right knee   THERAPY DIAG:  Impaired functional mobility, balance, gait, and endurance  Decreased range of motion (ROM) of right knee  Arthritis of right knee  Rationale for Evaluation and Treatment: Rehabilitation  ONSET DATE: 12/28/22  SUBJECTIVE:   SUBJECTIVE STATEMENT: Patient is feeling some pain on the medial aspect of the knee. Has been able to move CPM up to 110 degrees. Has a mammogram this afternoon after PT.   EVAL: Coming in for R TKA. 17th of this Month. Pt received home health PT. Pt reports getting around the house just fine, badges are starting to come off. Pt lives with daughter/son and grandchildren. Before the knee replacement, pt was independent with functional mobility and ADLs. Pt currently utilizing AD for ambulation, not driving currently. Assist from daugther for transfers into tub. Pt known to this clinc with prior TSA.   PERTINENT HISTORY: Lumbar fusion PAIN:  Are you having pain? Yes: NPRS scale: 5/10 Pain location: R knee Pain description: dull pain Aggravating factors: nothing Relieving factors: pain medications, ice  PRECAUTIONS: None  RED FLAGS: None   WEIGHT BEARING  RESTRICTIONS: No  FALLS:  Has patient fallen in last 6 months? No  LIVING ENVIRONMENT: Lives with: lives with their family Lives in: House/apartment Stairs: Yes: External: 4 steps; can reach both Has following equipment at home: Dan Humphreys - 2 wheeled  OCCUPATION: Retired  PLOF: Independent  PATIENT GOALS: "go get grand-baby at school"  NEXT MD VISIT: 02/07/23  OBJECTIVE:  Note: Objective measures were completed at Evaluation unless otherwise noted.  DIAGNOSTIC FINDINGS:   PATIENT SURVEYS:  FOTO 50.1%  COGNITION: Overall cognitive status: Within functional limits for tasks assessed     SENSATION: WFL  POSTURE: No Significant postural limitations  PALPATION: Not tender to Palpation  LOWER EXTREMITY ROM:  Active ROM Right eval Left eval Right 02/01/23 Right 02/10/23 Right 02/22/23 Right 03/07/23   Hip flexion        Hip extension        Hip abduction        Hip adduction  Hip internal rotation        Hip external rotation        Knee flexion 37 110 60, 63 AAROM 75 77 85  Knee extension -5 0  -3 (lacking) -3 lacking -3   Ankle dorsiflexion        Ankle plantarflexion        Ankle inversion        Ankle eversion         (Blank rows = not tested)  LOWER EXTREMITY MMT:  MMT Right eval Left eval Right 02/22/23  Hip flexion   4  Hip extension   2+  Hip abduction   4  Hip adduction     Hip internal rotation     Hip external rotation     Knee flexion   2+ limited range  Knee extension  4- 4-  Ankle dorsiflexion     Ankle plantarflexion     Ankle inversion     Ankle eversion      (Blank rows = not tested)  FUNCTIONAL TESTS:  30 seconds chair stand test: 8x 2 minute walk test: 271ft w/ RW  02/22/23: no AD: 386 30 seconds chair stand test: 13x  GAIT: Distance walked: 232ft Assistive device utilized: Walker - 2 wheeled Level of assistance: Modified independence Comments: Mild R circumduction during swing, limited R knee flexion  during swing. Mild antalgic gait pattern with RLE   TODAY'S TREATMENT:                                                                                                                              DATE:   03/14/2023 Supine: STM and manual ROM to right knee in supine; patellar glides in every direction  SAQ 3x10 with 3" hold, 2# AW SAQ with SLR 2x10  Sitting with contract relax x10' to improve soft tissue extensibility and right knee mobility. Manual distraction at the end. LAQ with manual resistance 2x10 Sit to stand / minisquat to mat 2x15 Knee drives on 7" step 5x10" hold at stairs  Step up 7" x10  03/08/2023 Supine: STM and manual ROM to right knee in supine; patellar glides in every direction  SAQ with manual resistance 3 sets 5x3" hold Sitting with contract relax x10' to improve soft tissue extensibility and right knee mobility. Manual distraction at the end. LAQ with manual resistance 2x10 Sit to stand / minisquat to mat 2x15 Knee drives on 6" step 5x10" hold in parallel bars  Gait training 3'  03/07/2023 Started with moist heat over knee 5' SLR x15 with quad set STM and manual ROM to right knee in supine and sitting with contract relax x10' to improve soft tissue extensibility and right knee mobility. Manual distraction at the end. LAQ with manual resistance 2x10 Standing hip ABD at counter 2x10 Heel raises to fatigue 2x Knee drives on 6" step 5x10" hold in parallel bars  Forward step ups 4" x15 Mini  squats x15   03/03/2023 Started with moist heat over knee 5' STM and man ROM to right knee in supine and sitting with contract relax x10' to improve soft tissue extensibility and right knee mobility. Manual distraction at the end. LAQ with manual resistance 2x10 Heel raises with toes on incline 2x20 Knee drives on 4" step 10x5" hold in parallel bars - ROM measurement in standing 78 Lateral step up on 4" step 2x10, 1 UE support  03/01/2023 STM and man ROM to right knee  in supine and sitting with contract relax x15' to improve soft tissue extensibility and right knee mobility SAQ with manual resistance 2x10 SAQ with SLR 2 x 10 Knee drives on 4" step 5x10" hold in parallel bars Step ups x 15 on 4" Bike seat 8 x 3' for knee ROM half revolutions  02/24/2023 Supine: STM and manual ROM right knee for 25' for right knee flexion and extension; seated contract relax for quads and hamstrings Supine -2 to 78; sitting knee flexion AAROM 82 degrees Standing: Knee drives on 7" step for flexion  2' Bike seat 8 x 3' for knee ROM half revolutions  02/22/23: Seated: knee hang with AAROM 5x 20" FOTO: 76% STM and retro grade massage for edema control x with LE elevated MMT See above Prone: Quad stretch with rope 3x 30" : 386 30 seconds chair stand test: 13x Knee drive on 2nd step (16XW) AROM 3-77 degrees  02/17/2023 STM and man ROM to right knee in supine and sitting with contract relax x15' to improve soft tissue extensibility and right knee mobility Supine SLR 2 x until fatigue SAQ 2 x until fatigue  Modified thomas stretch 2x30 sec paired with x15 LAQ between sets Mini squats to elevated mat 3x15    02/15/23 STM and man ROM to right knee in supine and sitting with contract relax x15' to improve soft tissue extensibility and right knee mobility Heel slides AAROM with strap 3' Squats to elevated mat 2x15;  Heel raises 2x15 with hand support Education about exercise modification, scar massage, and mobility at home  Measured knee flexion in supine at EOS. 80 AROM, 83 AAROM   02/10/2023 STM and man ROM to right knee in supine and sitting with contract relax x 10' to improve soft tissue extensibility and right knee mobility Standing: 12" box Right knee drives for flexion x 2' Heel raises 2 x 10 Squats 2 x 10 Nustep seat 8 arms 10 for mobility x 5' AROM right knee in supine -3 to 75 Seated heel slides x 5 with overpressure from left  leg   02/08/23 Manual in supine. Retrograde massage over quads, hamstrings, and calf. Patella mobs all directions. Tender to palpation over later joint line and medial hamstring insertion.  Supine:  Quad sets 10x5"  TKE 15 x 5" on half bolster (Short arc quad)  SLR with quad set prior raise 10x  Heel Slides x 10 with rope  PROM: knee ext: -3 degrees Standing:  Knee drive for flexion on 8in step height 5x 20"  Step ups on 4in step height 20x Step navigation (up 8in and down 4in) Gait training with and without [facility]   02/03/23: Gait training with SPC, cueing for UE and sequence with initial 3 point step to to 2 point step thru 2RT inside // bars for heel strike, swing through, toe push off, knee flexion  Manual:  Supine with LE elevated  Retrograde massage  Patella mobs all directions Supine:  Quad  sets 10x 5"  TKE 15 x 5" on half bolster  SLR with quad set prior raise 10x  Heel slide 10x 5-10" holds at end range   AROM 4- 68 degrees Seated: AAROM heel slide 10x 10" Standing:  Knee drive for flexion on 8in step height 5x 20" Bike seat 10 rocking x 5'  02/01/23 Seated: Leg dangle for flexion Calf/hamstring stretch with strap 2 x 30" Supine: STM to right knee to increase soft tissue mobility and joint ROM x 10' AROM right knee flexion 60 degrees; 63 AAROM SAQ x 10 SAQ into SLR  x 10  Standing: Knee drives for flexion x 2' on 7" step Heel toe raises x 10   01/27/2023  -Seated knee flexion active stretch 5 x 30'' hold- cues for reduced trunk lean compensation. -Heel slides with a strap x 10 x 3 with 30" hold on flexion  -Supine R SLR x 15 with tactile cues at distal foot for improved knee extesion -Seated LAQ 10 x 3  -Standing weight shifts x 10 bilaterally with symmetrical foot placement tactile cues at hips within Rw.  -Elevated sit/stands with even foot placement and no UE support 2 x 10- 6/10 pain. Cues for symmetry.   01/25/23 NuStep, seat 10 , arm 8, level 1 x  5' Heel slides with a strap x 10 x 3 with 30" hold on flexion on the last rep Supine R SLR x 10 x 2 R LAQ x 3" x 10 x 2 Heel/toe raises with UE support x 10 x 2 Standing hip abd/ext  with UE support x 10 x 2   01/20/2023 PT Evaluation, findings, HEP, prognosis, and frequency   PATIENT EDUCATION:  Education details: see below Person educated: Patient Education method: Explanation and Demonstration Education comprehension: verbalized understanding  HOME EXERCISE PROGRAM: Access Code: [email or web address] URL: [email or web address] 01/25/2023 - Supine Active Straight Leg Raise  - 1-2 x daily - 7 x weekly - 2 sets - 10 reps - Heel Toe Raises with Counter Support  - 1-2 x daily - 7 x weekly - 2 sets - 10 reps - Seated Long Arc Quad  - 1-2 x daily - 7 x weekly - 2 sets - 10 reps - 3 hold - Standing Hip Extension with Counter Support  - 1-2 x daily - 7 x weekly - 2 sets - 10 reps - Standing Hip Abduction with Counter Support  - 1-2 x daily - 7 x weekly - 2 sets - 10 reps  Date: 01/20/2023 Prepared by: Starling Manns  Exercises - Supine Heel Slide  - 1 x daily - 7 x weekly - 3 sets - 10 reps - Seated Knee Flexion Extension AROM   - 1 x daily - 7 x weekly - 3 sets - 10 reps - Supine Gluteal Sets  - 1 x daily - 7 x weekly - 3 sets - 10 reps - Supine Knee Extension Stretch on Towel Roll  - 1 x daily - 7 x weekly - 3 sets - 10 reps  ASSESSMENT:  CLINICAL IMPRESSION: Today's session focused on improving knee flexion ROM, end range quad control and strength. Patient denied moist heat today. STM and contract-relax interventions allowed for improved knee flexion PROM and AROM. Scar mobilizations to distal quad continue to be beneficial, especially during SAQ and LAQ exercises. Patient was unable to attain hook lying position for glute bridges. Introducing lateral stepping and hip exercises will help address these gait disturbances. Patient showing good improvement with mobility  and self  management. Patient is very motivated to get better. Patient was able tolerate exercises without an increase in pain. Patient will continue to benefit from skilled intervention to address unmet goals, promote return to optimal function, and increase knee ROM.       OBJECTIVE IMPAIRMENTS: Abnormal gait, decreased activity tolerance, decreased balance, decreased mobility, difficulty walking, decreased ROM, decreased strength, hypomobility, increased fascial restrictions, impaired flexibility, and pain.   ACTIVITY LIMITATIONS: carrying, lifting, bending, sitting, standing, squatting, stairs, transfers, and locomotion level  PARTICIPATION LIMITATIONS: driving, shopping, community activity, occupation, and yard work  PERSONAL FACTORS: Age are also affecting patient's functional outcome.   REHAB POTENTIAL: Good  CLINICAL DECISION MAKING: Stable/uncomplicated  EVALUATION COMPLEXITY: Low   GOALS: Goals reviewed with patient? No  SHORT TERM GOALS: Target date: 11/7/024  Pt will be independent with HEP in order to demonstrate participation in Physical Therapy POC.  Baseline:  Reports compliance with HEP Goal status: MET  2.  Pt will report 3/10 pain improvement during functional activity n order to demonstrate improved pain with functional activities.  Baseline: Reports increased pain with stretches 7/10 Goal status: IN PROGRESS  LONG TERM GOALS: Target date: 03/17/2023  Pt will improve 30 second chair test by > 2 reps in order to demonstrate improved functional strength to return to desired activities.  Baseline: 8x; 02/22/23: 13x no HHA Goal status: MET  2.  Pt will improve 2 MWT by >122ft with LRAD in order to demonstrate improved functional ambulatory capacity in community setting.  Baseline: 230ft with RW; 02/22/23: 386 ft no AD Goal status: MET  3.  Pt will improve FOTO score by > 5 points in order to demonstrate improved pain with functional goals and outcomes. Baseline: 50.1  02/22/23: 76% Goal status: MET  4.  Pt will improve R knee extension ROM to 0 degrees in order to improve knee mobility during functional activities. Baseline: -5 degrees from 0; 02/22/23: AROM 3-77 degrees Goal status: IN PROGRESS  5.  Pt will improve R knee flexion ROM to >110 degrees in order to improve knee mobility during functional activities. Baseline: 45 degrees currently; 02/22/23: AROM 3-77 degrees Goal status: IN PROGRESS    PLAN:   PT FREQUENCY: 2x/week  PT DURATION: 8 weeks  PLANNED INTERVENTIONS: 97146- PT Re-evaluation, 97110-Therapeutic exercises, 97530- Therapeutic activity, 97112- Neuromuscular re-education, 97535- Self Care, 66440- Manual therapy, [phone]- Gait training, Patient/Family education, Balance training, Stair training, and Joint mobilization  PLAN FOR NEXT SESSION: Reassess next session. Progress as tolerated with emphasis on R knee AROM & PROM (especially flexion); Introduce hip strengthening. When appropriate, add standing balance and gait exercises to improve functional activities.    9:32 AM, 03/14/23 Karna Christmas, SPT  " I agree with the following treatment note after reviewing documentation. This session was performed under the supervision of a licensed clinician."    9:32 AM, 03/14/23 Amy Small Lynch MPT [facility] [place] [phone]

## 2023-03-16 ENCOUNTER — Ambulatory Visit (HOSPITAL_COMMUNITY): Payer: Medicare HMO

## 2023-03-16 ENCOUNTER — Encounter (HOSPITAL_COMMUNITY): Payer: Self-pay

## 2023-03-16 DIAGNOSIS — M1711 Unilateral primary osteoarthritis, right knee: Secondary | ICD-10-CM | POA: Diagnosis not present

## 2023-03-16 DIAGNOSIS — M25661 Stiffness of right knee, not elsewhere classified: Secondary | ICD-10-CM | POA: Diagnosis not present

## 2023-03-16 DIAGNOSIS — Z7409 Other reduced mobility: Secondary | ICD-10-CM

## 2023-03-16 NOTE — Therapy (Addendum)
OUTPATIENT PHYSICAL THERAPY LOWER EXTREMITY TREATMENT/PROGRESS NOTE  Progress Note Reporting Period 01/20/2023 to 03/16/2023  See note below for Objective Data and Assessment of Progress/Goals.     Patient Name: Victoria Reyes MRN: [id number] DOB:1950/07/03, 72 y.o., female Today's Date: 03/16/2023  END OF SESSION:  PT End of Session - 03/16/23 0729     Visit Number 17    Number of Visits 20    Date for PT Re-Evaluation 03/17/23    Authorization Type [employer] medicare (12 visits approved) requesting new auth from 11/21    Authorization Time Period 01/20/23-03/17/23    Authorization - Visit Number 16    Authorization - Number of Visits 12    Progress Note Due on Visit 12    Activity Tolerance Patient tolerated treatment well    Behavior During Therapy [facility] for tasks assessed/performed              Past Medical History:  Diagnosis Date   Anemia    as a  young woman   Diverticulitis 2005   CT    DJD (degenerative joint disease)    GERD (gastroesophageal reflux disease)    H. pylori infection 01/2020   S/p treatment with omeprazole, amoxicillin, and Biaxin.   Headache(784.0)    Hyperlipidemia    Hypothyroidism    Knee pain, right    Low back pain    Neuropathy    peroneal nerve   Neuropathy    left lower leg from back surgery per patient   Obesity    Pneumonia    as child   S/P colonoscopy 2005   Dr. Katrinka Blazing: sigmoid diverticulosis   Thyroid disease    Tobacco abuse    Past Surgical History:  Procedure Laterality Date   BACK SURGERY     BIOPSY  01/29/2020   Procedure: BIOPSY;  Surgeon: Dolores Frame, MD;  Location: [facility];  Service: Gastroenterology;;   BIOPSY  05/13/2020   Procedure: BIOPSY;  Surgeon: Lanelle Bal, DO;  Location: [facility] ENDOSCOPY;  Service: Endoscopy;;   COLONOSCOPY  12/18/2010   Procedure: COLONOSCOPY;  Surgeon: Arlyce Harman, MD; 3 mm sessile polyp at hepatic flexure, pancolonic diverticulosis, internal hemorrhoids, anal  lesion just above the dentate line biopsied via cold forceps.  Colonic polyp was a tubular adenoma.  Anus biopsy with mild active inflammation associated with reactive epithelial changes and hemorrhage.  Recommended repeat colonoscopy in 2022.   COLONOSCOPY WITH PROPOFOL N/A 05/13/2020   Procedure: COLONOSCOPY WITH PROPOFOL;  Surgeon: Lanelle Bal, DO;  Location: [facility] ENDOSCOPY;  Service: Endoscopy;  Laterality: N/A;  12:30PM   cyst removal right wrist     ESOPHAGOGASTRODUODENOSCOPY (EGD) WITH PROPOFOL N/A 01/29/2020   Procedure: ESOPHAGOGASTRODUODENOSCOPY (EGD) WITH PROPOFOL;  Surgeon: Marguerita Merles, Reuel Boom, MD;   3 gastric ulcers with clean base, 5 ulcers in the duodenal bulb and first portion of duodenum, 2 were noted to be oozing, treated with bipolar cautery.  Gastric biopsies consistent with H. pylori gastritis.   ESOPHAGOGASTRODUODENOSCOPY (EGD) WITH PROPOFOL N/A 05/13/2020   Procedure: ESOPHAGOGASTRODUODENOSCOPY (EGD) WITH PROPOFOL;  Surgeon: Lanelle Bal, DO;  Location: [facility] ENDOSCOPY;  Service: Endoscopy;  Laterality: N/A;   EYE SURGERY Bilateral    cataract surgery with lens implant   POLYPECTOMY  05/13/2020   Procedure: POLYPECTOMY;  Surgeon: Lanelle Bal, DO;  Location: [facility] ENDOSCOPY;  Service: Endoscopy;;   TONSILLECTOMY     TOTAL KNEE ARTHROPLASTY Right 12/28/2022   Procedure: TOTAL KNEE ARTHROPLASTY;  Surgeon: August Saucer,  Corrie Mckusick, MD;  Location: [facility] OR;  Service: Orthopedics;  Laterality: Right;   TOTAL SHOULDER ARTHROPLASTY Left 12/21/2016   Procedure: TOTAL SHOULDER ARTHROPLASTY;  Surgeon: Cammy Copa, MD;  Location: [facility] OR;  Service: Orthopedics;  Laterality: Left;   TUBAL LIGATION     umbillical hernia     Patient Active Problem List   Diagnosis Date Noted   OA (osteoarthritis) of knee 12/28/2022   S/P total knee arthroplasty, right 12/28/2022   Low vitamin D level 11/01/2022   Leg swelling 06/28/2022   Giardiasis 08/23/2021   Tapeworm infection 08/23/2021    Leg pain, left 11/17/2020   Alopecia 05/06/2020   History of Helicobacter pylori infection 04/09/2020   Anemia 04/09/2020   History of adenomatous polyp of colon 04/09/2020   Gastric ulcer 01/29/2020   Duodenal ulcer 01/29/2020   Sacro ilial pain 11/08/2019   Chronic back pain 09/11/2019   Immunization refused 03/11/2019   Encounter for annual physical exam 07/10/2015   Cutaneous wart 09/30/2014   Hyperlipidemia with target LDL less than 100 05/04/2012   GERD (gastroesophageal reflux disease) 03/14/2011   Chronic right-sided low back pain with right-sided sciatica 06/25/2009   Morbid obesity (HCC) 02/17/2007   Hypothyroidism 06/14/2006   Diverticulosis of colon 03/01/2006   HIATAL HERNIA WITH REFLUX, HX OF 03/01/2006    PCP: Syliva Overman MD  REFERRING PROVIDER:   Cammy Copa, MD    REFERRING DIAG: M17.11 (ICD-10-CM) - Unilateral primary osteoarthritis, right knee   THERAPY DIAG:  Arthritis of right knee  Decreased range of motion (ROM) of right knee  Impaired functional mobility, balance, gait, and endurance  Rationale for Evaluation and Treatment: Rehabilitation  ONSET DATE: 12/28/22  SUBJECTIVE:   SUBJECTIVE STATEMENT: Patient is doing well today. Notices very little pain recently; mainly at night. Generally, less sensitive and able to do more on a day to day basis.  Sees MD 03/23/23  EVAL: Coming in for R TKA. 17th of this Month. Pt received home health PT. Pt reports getting around the house just fine, badges are starting to come off. Pt lives with daughter/son and grandchildren. Before the knee replacement, pt was independent with functional mobility and ADLs. Pt currently utilizing AD for ambulation, not driving currently. Assist from daugther for transfers into tub. Pt known to this clinc with prior TSA.   PERTINENT HISTORY: Lumbar fusion PAIN:  Are you having pain? Yes: NPRS scale: 5/10 Pain location: R knee Pain description: dull  pain Aggravating factors: nothing Relieving factors: pain medications, ice  PRECAUTIONS: None  RED FLAGS: None   WEIGHT BEARING RESTRICTIONS: No  FALLS:  Has patient fallen in last 6 months? No  LIVING ENVIRONMENT: Lives with: lives with their family Lives in: House/apartment Stairs: Yes: External: 4 steps; can reach both Has following equipment at home: Dan Humphreys - 2 wheeled  OCCUPATION: Retired  PLOF: Independent  PATIENT GOALS: "go get grand-baby at school"  NEXT MD VISIT: 02/07/23  OBJECTIVE:  Note: Objective measures were completed at Evaluation unless otherwise noted.  DIAGNOSTIC FINDINGS:   PATIENT SURVEYS:  FOTO 50.1% 03/16/23: 71.9  COGNITION: Overall cognitive status: Within functional limits for tasks assessed     SENSATION: WFL  POSTURE: No Significant postural limitations  PALPATION: Not tender to Palpation  LOWER EXTREMITY ROM:  Active ROM Right eval Left eval Right 02/01/23 Right 02/10/23 Right 02/22/23 Right 03/07/23  Right 03/16/23  Hip flexion         Hip extension  Hip abduction         Hip adduction         Hip internal rotation         Hip external rotation         Knee flexion 37 110 60, 63 AAROM 75 77 85 90  Knee extension -5 0  -3 (lacking) -3 lacking -3  -3  Ankle dorsiflexion         Ankle plantarflexion         Ankle inversion         Ankle eversion          (Blank rows = not tested)  LOWER EXTREMITY MMT:  MMT Right eval Left eval Right 02/22/23 Right 03/16/23  Hip flexion   4   Hip extension   2+   Hip abduction   4   Hip adduction      Hip internal rotation      Hip external rotation      Knee flexion   2+ limited range 3+  Knee extension  4- 4- 4+  Ankle dorsiflexion      Ankle plantarflexion      Ankle inversion      Ankle eversion       (Blank rows = not tested)  FUNCTIONAL TESTS:  30 seconds chair stand test: 8x 2 minute walk test: 278ft w/ RW  02/22/23: no AD: 386 ft 30 seconds  chair stand test: 13x  03/16/23: no AD: 427 ft  30 seconds chair stand test: 14x   GAIT: Distance walked: 267ft Assistive device utilized: Walker - 2 wheeled Level of assistance: Modified independence Comments: Mild R circumduction during swing, limited R knee flexion during swing. Mild antalgic gait pattern with RLE   TODAY'S TREATMENT:                                                                                                                              DATE:  03/16/2023 Progress note Supine: STM and manual ROM to right knee in supine; patellar glides (medial, lateral, superior) Sitting contract relax x5' to improve soft tissue extensibility and right knee mobility. Manual distraction  : 427 ft 30 sec STS: 14x   03/14/2023 Supine: STM and manual ROM to right knee in supine; patellar glides in every direction  SAQ 3x10 with 3" hold, 2# AW SAQ with SLR 2x10  Sitting with contract relax x10' to improve soft tissue extensibility and right knee mobility. Manual distraction at the end. LAQ with manual resistance 2x10 Sit to stand / minisquat to mat 2x15 Knee drives on 7" step 5x10" hold at stairs  Step up 7" x10  03/08/2023 Supine: STM and manual ROM to right knee in supine; patellar glides in every direction  SAQ with manual resistance 3 sets 5x3" hold Sitting with contract relax x10' to improve soft tissue extensibility and right knee mobility. Manual distraction at the end. LAQ with manual  resistance 2x10 Sit to stand / minisquat to mat 2x15 Knee drives on 6" step 5x10" hold in parallel bars  Gait training 3'  03/07/2023 Started with moist heat over knee 5' SLR x15 with quad set STM and manual ROM to right knee in supine and sitting with contract relax x10' to improve soft tissue extensibility and right knee mobility. Manual distraction at the end. LAQ with manual resistance 2x10 Standing hip ABD at counter 2x10 Heel raises to fatigue 2x Knee drives on 6"  step 5x10" hold in parallel bars  Forward step ups 4" x15 Mini squats x15   03/03/2023 Started with moist heat over knee 5' STM and man ROM to right knee in supine and sitting with contract relax x10' to improve soft tissue extensibility and right knee mobility. Manual distraction at the end. LAQ with manual resistance 2x10 Heel raises with toes on incline 2x20 Knee drives on 4" step 10x5" hold in parallel bars - ROM measurement in standing 78 Lateral step up on 4" step 2x10, 1 UE support  03/01/2023 STM and man ROM to right knee in supine and sitting with contract relax x15' to improve soft tissue extensibility and right knee mobility SAQ with manual resistance 2x10 SAQ with SLR 2 x 10 Knee drives on 4" step 5x10" hold in parallel bars Step ups x 15 on 4" Bike seat 8 x 3' for knee ROM half revolutions  02/24/2023 Supine: STM and manual ROM right knee for 25' for right knee flexion and extension; seated contract relax for quads and hamstrings Supine -2 to 78; sitting knee flexion AAROM 82 degrees Standing: Knee drives on 7" step for flexion  2' Bike seat 8 x 3' for knee ROM half revolutions  02/22/23: Seated: knee hang with AAROM 5x 20" FOTO: 76% STM and retro grade massage for edema control x with LE elevated MMT See above Prone: Quad stretch with rope 3x 30" : 386 30 seconds chair stand test: 13x Knee drive on 2nd step (30QM) AROM 3-77 degrees  02/17/2023 STM and man ROM to right knee in supine and sitting with contract relax x15' to improve soft tissue extensibility and right knee mobility Supine SLR 2 x until fatigue SAQ 2 x until fatigue  Modified thomas stretch 2x30 sec paired with x15 LAQ between sets Mini squats to elevated mat 3x15    02/15/23 STM and man ROM to right knee in supine and sitting with contract relax x15' to improve soft tissue extensibility and right knee mobility Heel slides AAROM with strap 3' Squats to elevated mat 2x15;  Heel  raises 2x15 with hand support Education about exercise modification, scar massage, and mobility at home  Measured knee flexion in supine at EOS. 80 AROM, 83 AAROM   02/10/2023 STM and man ROM to right knee in supine and sitting with contract relax x 10' to improve soft tissue extensibility and right knee mobility Standing: 12" box Right knee drives for flexion x 2' Heel raises 2 x 10 Squats 2 x 10 Nustep seat 8 arms 10 for mobility x 5' AROM right knee in supine -3 to 75 Seated heel slides x 5 with overpressure from left leg   02/08/23 Manual in supine. Retrograde massage over quads, hamstrings, and calf. Patella mobs all directions. Tender to palpation over later joint line and medial hamstring insertion.  Supine:  Quad sets 10x5"  TKE 15 x 5" on half bolster (Short arc quad)  SLR with quad set prior raise 10x  Heel Slides x 10 with rope  PROM: knee ext: -3 degrees Standing:  Knee drive for flexion on 8in step height 5x 20"  Step ups on 4in step height 20x Step navigation (up 8in and down 4in) Gait training with and without [facility]   02/03/23: Gait training with SPC, cueing for UE and sequence with initial 3 point step to to 2 point step thru 2RT inside // bars for heel strike, swing through, toe push off, knee flexion  Manual:  Supine with LE elevated  Retrograde massage  Patella mobs all directions Supine:  Quad sets 10x 5"  TKE 15 x 5" on half bolster  SLR with quad set prior raise 10x  Heel slide 10x 5-10" holds at end range   AROM 4- 68 degrees Seated: AAROM heel slide 10x 10" Standing:  Knee drive for flexion on 8in step height 5x 20" Bike seat 10 rocking x 5'  02/01/23 Seated: Leg dangle for flexion Calf/hamstring stretch with strap 2 x 30" Supine: STM to right knee to increase soft tissue mobility and joint ROM x 10' AROM right knee flexion 60 degrees; 63 AAROM SAQ x 10 SAQ into SLR  x 10  Standing: Knee drives for flexion x 2' on 7" step Heel toe  raises x 10   01/27/2023  -Seated knee flexion active stretch 5 x 30'' hold- cues for reduced trunk lean compensation. -Heel slides with a strap x 10 x 3 with 30" hold on flexion  -Supine R SLR x 15 with tactile cues at distal foot for improved knee extesion -Seated LAQ 10 x 3  -Standing weight shifts x 10 bilaterally with symmetrical foot placement tactile cues at hips within Rw.  -Elevated sit/stands with even foot placement and no UE support 2 x 10- 6/10 pain. Cues for symmetry.   01/25/23 NuStep, seat 10 , arm 8, level 1 x 5' Heel slides with a strap x 10 x 3 with 30" hold on flexion on the last rep Supine R SLR x 10 x 2 R LAQ x 3" x 10 x 2 Heel/toe raises with UE support x 10 x 2 Standing hip abd/ext  with UE support x 10 x 2   01/20/2023 PT Evaluation, findings, HEP, prognosis, and frequency   PATIENT EDUCATION:  Education details: see below Person educated: Patient Education method: Explanation and Demonstration Education comprehension: verbalized understanding  HOME EXERCISE PROGRAM: Access Code: [email or web address] URL: [email or web address] 01/25/2023 - Supine Active Straight Leg Raise  - 1-2 x daily - 7 x weekly - 2 sets - 10 reps - Heel Toe Raises with Counter Support  - 1-2 x daily - 7 x weekly - 2 sets - 10 reps - Seated Long Arc Quad  - 1-2 x daily - 7 x weekly - 2 sets - 10 reps - 3 hold - Standing Hip Extension with Counter Support  - 1-2 x daily - 7 x weekly - 2 sets - 10 reps - Standing Hip Abduction with Counter Support  - 1-2 x daily - 7 x weekly - 2 sets - 10 reps  Date: 01/20/2023 Prepared by: Starling Manns  Exercises - Supine Heel Slide  - 1 x daily - 7 x weekly - 3 sets - 10 reps - Seated Knee Flexion Extension AROM   - 1 x daily - 7 x weekly - 3 sets - 10 reps - Supine Gluteal Sets  - 1 x daily - 7 x weekly - 3 sets - 10 reps - Supine Knee  Extension Stretch on Towel Roll  - 1 x daily - 7 x weekly - 3 sets - 10 reps  ASSESSMENT:  CLINICAL  IMPRESSION: Progress note and reassessment during today's session. Patient is showing good improvement with daily tolerance to exercise and activity. Pain is really only present during end range activities and at night. FOTO score improved to drastically to 71.9 from 50.1 since the start of PT. Strength improvements of the right knee have improved since last reassessment date. Right knee extension is still slightly limited, but flexion ROM is coming along well. STM and contract-relax interventions allowed for improved knee flexion PROM and AROM. Current and 30 sec STS demonstrate improvements in functional ability. Patient's gait during shows little dysfunction; symmetrical stride length, arm swing, and minimal lateral trunk sway. Patient continues to be very motivated to improve. PT going forward will continue to focus on improving R knee ROM as well as balance and functional strength gains, especially with standing activity. Patient will continue to benefit from skilled intervention to address unmet goals, promote return to optimal function, and increase knee ROM.       OBJECTIVE IMPAIRMENTS: Abnormal gait, decreased activity tolerance, decreased balance, decreased mobility, difficulty walking, decreased ROM, decreased strength, hypomobility, increased fascial restrictions, impaired flexibility, and pain.   ACTIVITY LIMITATIONS: carrying, lifting, bending, sitting, standing, squatting, stairs, transfers, and locomotion level  PARTICIPATION LIMITATIONS: driving, shopping, community activity, occupation, and yard work  PERSONAL FACTORS: Age are also affecting patient's functional outcome.   REHAB POTENTIAL: Good  CLINICAL DECISION MAKING: Stable/uncomplicated  EVALUATION COMPLEXITY: Low   GOALS: Goals reviewed with patient? No  SHORT TERM GOALS: Target date: 11/7/024  Pt will be independent with HEP in order to demonstrate participation in Physical Therapy POC.  Baseline:  Reports  compliance with HEP Goal status: MET  2.  Pt will report 3/10 pain improvement during functional activity in order to demonstrate improved pain with functional activities.  Baseline: Reports increased pain with stretches 7/10 03/16/23: No pain during the day; 3/10 at night. Goal status: MET  LONG TERM GOALS: Target date: 03/17/2023  Pt will improve 30 second chair test by > 2 reps in order to demonstrate improved functional strength to return to desired activities.  Baseline: 8x; 02/22/23: 13x no HHA 03/16/23: 14x Goal status: MET  2.  Pt will improve 2 MWT by >146ft with LRAD in order to demonstrate improved functional ambulatory capacity in community setting.  Baseline: 255ft with RW; 02/22/23: 386 ft no AD 03/16/23: 427 ft Goal status: MET  3.  Pt will improve FOTO score by > 5 points in order to demonstrate improved pain with functional goals and outcomes. Baseline: 50.1 02/22/23: 76% Goal status: MET  4.  Pt will improve R knee extension ROM to 0 degrees in order to improve knee mobility during functional activities. Baseline: -5 degrees from 0; 02/22/23: AROM 3-77 degrees 03/16/23: AAROM 3-90 Goal status: IN PROGRESS  5.  Pt will improve R knee flexion ROM to >110 degrees in order to improve knee mobility during functional activities. Baseline: 45 degrees currently; 02/22/23: AROM 3-77 degrees 03/16/23: AAROM 3-90 Goal status: IN PROGRESS    PLAN:   PT FREQUENCY: 2x/week  PT DURATION: 8 weeks  PLANNED INTERVENTIONS: 97146- PT Re-evaluation, 97110-Therapeutic exercises, 97530- Therapeutic activity, 97112- Neuromuscular re-education, 97535- Self Care, 16109- Manual therapy, [phone]- Gait training, Patient/Family education, Balance training, Stair training, and Joint mobilization  PLAN FOR NEXT SESSION: Progress exercises as tolerated with emphasis on R  knee AROM & PROM (especially flexion); Introduce hip strengthening. When appropriate, add standing balance and gait exercises  to improve functional activities.    8:34 AM, 03/16/23 Karna Christmas, SPT  " I agree with the following treatment note after reviewing documentation. This session was performed under the supervision of a licensed clinician."    8:34 AM, 03/16/23 Amy Small Lynch MPT [place] [place] [phone]

## 2023-03-21 ENCOUNTER — Ambulatory Visit (HOSPITAL_COMMUNITY): Payer: Medicare HMO

## 2023-03-21 DIAGNOSIS — M1711 Unilateral primary osteoarthritis, right knee: Secondary | ICD-10-CM | POA: Diagnosis not present

## 2023-03-21 DIAGNOSIS — Z7409 Other reduced mobility: Secondary | ICD-10-CM | POA: Diagnosis not present

## 2023-03-21 DIAGNOSIS — M25661 Stiffness of right knee, not elsewhere classified: Secondary | ICD-10-CM

## 2023-03-21 NOTE — Addendum Note (Signed)
Addended byBurnadette Peter, Field Staniszewski S on: 03/21/2023 11:16 AM   Modules accepted: Orders

## 2023-03-21 NOTE — Therapy (Addendum)
OUTPATIENT PHYSICAL THERAPY LOWER EXTREMITY TREATMENT   Patient Name: Victoria Reyes MRN: [id number] DOB:February 12, 1951, 72 y.o., female Today's Date: 03/21/2023  END OF SESSION:  PT End of Session - 03/21/23 0849     Visit Number 18    Number of Visits 20    Date for PT Re-Evaluation 03/17/23    Authorization Type [employer] medicare (12 visits approved) requesting new auth from 11/21    Authorization Time Period 01/20/23-03/17/23    Authorization - Number of Visits 12    Progress Note Due on Visit 12    PT Start Time 0849    PT Stop Time 0930    PT Time Calculation (min) 41 min    Activity Tolerance Patient tolerated treatment well    Behavior During Therapy [facility] for tasks assessed/performed              Past Medical History:  Diagnosis Date   Anemia    as a  young woman   Diverticulitis 2005   CT    DJD (degenerative joint disease)    GERD (gastroesophageal reflux disease)    H. pylori infection 01/2020   S/p treatment with omeprazole, amoxicillin, and Biaxin.   Headache(784.0)    Hyperlipidemia    Hypothyroidism    Knee pain, right    Low back pain    Neuropathy    peroneal nerve   Neuropathy    left lower leg from back surgery per patient   Obesity    Pneumonia    as child   S/P colonoscopy 2005   Dr. Katrinka Blazing: sigmoid diverticulosis   Thyroid disease    Tobacco abuse    Past Surgical History:  Procedure Laterality Date   BACK SURGERY     BIOPSY  01/29/2020   Procedure: BIOPSY;  Surgeon: Dolores Frame, MD;  Location: [facility] ENDO SUITE;  Service: Gastroenterology;;   BIOPSY  05/13/2020   Procedure: BIOPSY;  Surgeon: Lanelle Bal, DO;  Location: [facility] ENDOSCOPY;  Service: Endoscopy;;   COLONOSCOPY  12/18/2010   Procedure: COLONOSCOPY;  Surgeon: Arlyce Harman, MD; 3 mm sessile polyp at hepatic flexure, pancolonic diverticulosis, internal hemorrhoids, anal lesion just above the dentate line biopsied via cold forceps.  Colonic polyp was a tubular adenoma.   Anus biopsy with mild active inflammation associated with reactive epithelial changes and hemorrhage.  Recommended repeat colonoscopy in 2022.   COLONOSCOPY WITH PROPOFOL N/A 05/13/2020   Procedure: COLONOSCOPY WITH PROPOFOL;  Surgeon: Lanelle Bal, DO;  Location: [facility] ENDOSCOPY;  Service: Endoscopy;  Laterality: N/A;  12:30PM   cyst removal right wrist     ESOPHAGOGASTRODUODENOSCOPY (EGD) WITH PROPOFOL N/A 01/29/2020   Procedure: ESOPHAGOGASTRODUODENOSCOPY (EGD) WITH PROPOFOL;  Surgeon: Marguerita Merles, Reuel Boom, MD;   3 gastric ulcers with clean base, 5 ulcers in the duodenal bulb and first portion of duodenum, 2 were noted to be oozing, treated with bipolar cautery.  Gastric biopsies consistent with H. pylori gastritis.   ESOPHAGOGASTRODUODENOSCOPY (EGD) WITH PROPOFOL N/A 05/13/2020   Procedure: ESOPHAGOGASTRODUODENOSCOPY (EGD) WITH PROPOFOL;  Surgeon: Lanelle Bal, DO;  Location: [facility] ENDOSCOPY;  Service: Endoscopy;  Laterality: N/A;   EYE SURGERY Bilateral    cataract surgery with lens implant   POLYPECTOMY  05/13/2020   Procedure: POLYPECTOMY;  Surgeon: Lanelle Bal, DO;  Location: [facility] ENDOSCOPY;  Service: Endoscopy;;   TONSILLECTOMY     TOTAL KNEE ARTHROPLASTY Right 12/28/2022   Procedure: TOTAL KNEE ARTHROPLASTY;  Surgeon: Cammy Copa, MD;  Location: [facility] OR;  Service: Orthopedics;  Laterality: Right;   TOTAL SHOULDER ARTHROPLASTY Left 12/21/2016   Procedure: TOTAL SHOULDER ARTHROPLASTY;  Surgeon: Cammy Copa, MD;  Location: [facility] OR;  Service: Orthopedics;  Laterality: Left;   TUBAL LIGATION     umbillical hernia     Patient Active Problem List   Diagnosis Date Noted   OA (osteoarthritis) of knee 12/28/2022   S/P total knee arthroplasty, right 12/28/2022   Low vitamin D level 11/01/2022   Leg swelling 06/28/2022   Giardiasis 08/23/2021   Tapeworm infection 08/23/2021   Leg pain, left 11/17/2020   Alopecia 05/06/2020   History of Helicobacter pylori infection  04/09/2020   Anemia 04/09/2020   History of adenomatous polyp of colon 04/09/2020   Gastric ulcer 01/29/2020   Duodenal ulcer 01/29/2020   Sacro ilial pain 11/08/2019   Chronic back pain 09/11/2019   Immunization refused 03/11/2019   Encounter for annual physical exam 07/10/2015   Cutaneous wart 09/30/2014   Hyperlipidemia with target LDL less than 100 05/04/2012   GERD (gastroesophageal reflux disease) 03/14/2011   Chronic right-sided low back pain with right-sided sciatica 06/25/2009   Morbid obesity (HCC) 02/17/2007   Hypothyroidism 06/14/2006   Diverticulosis of colon 03/01/2006   HIATAL HERNIA WITH REFLUX, HX OF 03/01/2006    PCP: Syliva Overman MD  REFERRING PROVIDER:   Cammy Copa, MD    REFERRING DIAG: M17.11 (ICD-10-CM) - Unilateral primary osteoarthritis, right knee   THERAPY DIAG:  Impaired functional mobility, balance, gait, and endurance  Decreased range of motion (ROM) of right knee  Arthritis of right knee  Rationale for Evaluation and Treatment: Rehabilitation  ONSET DATE: 12/28/22  SUBJECTIVE:   SUBJECTIVE STATEMENT: Patient is doing well today. No issues over the weekend. Drove for the first time this morning since her daughter couldn't bring her. Notices very little pain recently; mainly at night. Generally, less sensitive and able to do more on a day to day basis. Sees MD 03/23/23 before next PT visit.  EVAL: Coming in for R TKA. 17th of this Month. Pt received home health PT. Pt reports getting around the house just fine, badges are starting to come off. Pt lives with daughter/son and grandchildren. Before the knee replacement, pt was independent with functional mobility and ADLs. Pt currently utilizing AD for ambulation, not driving currently. Assist from daugther for transfers into tub. Pt known to this clinc with prior TSA.   PERTINENT HISTORY: Lumbar fusion PAIN:  Are you having pain? Yes: NPRS scale: 5/10 Pain location: R  knee Pain description: dull pain Aggravating factors: nothing Relieving factors: pain medications, ice  PRECAUTIONS: None  RED FLAGS: None   WEIGHT BEARING RESTRICTIONS: No  FALLS:  Has patient fallen in last 6 months? No  LIVING ENVIRONMENT: Lives with: lives with their family Lives in: House/apartment Stairs: Yes: External: 4 steps; can reach both Has following equipment at home: Dan Humphreys - 2 wheeled  OCCUPATION: Retired  PLOF: Independent  PATIENT GOALS: "go get grand-baby at school"  NEXT MD VISIT: 02/07/23  OBJECTIVE:  Note: Objective measures were completed at Evaluation unless otherwise noted.  DIAGNOSTIC FINDINGS:   PATIENT SURVEYS:  FOTO 50.1% 03/16/23: 71.9  COGNITION: Overall cognitive status: Within functional limits for tasks assessed     SENSATION: WFL  POSTURE: No Significant postural limitations  PALPATION: Not tender to Palpation  LOWER EXTREMITY ROM:  Active ROM Right eval Left eval Right 02/01/23 Right 02/10/23 Right 02/22/23 Right 03/07/23  Right 03/16/23  Hip flexion  Hip extension         Hip abduction         Hip adduction         Hip internal rotation         Hip external rotation         Knee flexion 37 110 60, 63 AAROM 75 77 85 90  Knee extension -5 0  -3 (lacking) -3 lacking -3  -3  Ankle dorsiflexion         Ankle plantarflexion         Ankle inversion         Ankle eversion          (Blank rows = not tested)  LOWER EXTREMITY MMT:  MMT Right eval Left eval Right 02/22/23 Right 03/16/23  Hip flexion   4   Hip extension   2+   Hip abduction   4   Hip adduction      Hip internal rotation      Hip external rotation      Knee flexion   2+ limited range 3+  Knee extension  4- 4- 4+  Ankle dorsiflexion      Ankle plantarflexion      Ankle inversion      Ankle eversion       (Blank rows = not tested)  FUNCTIONAL TESTS:  30 seconds chair stand test: 8x 2 minute walk test: 262ft w/ RW  02/22/23:  no AD: 386 ft 30 seconds chair stand test: 13x  03/16/23: no AD: 427 ft  30 seconds chair stand test: 14x   GAIT: Distance walked: 278ft Assistive device utilized: Walker - 2 wheeled Level of assistance: Modified independence Comments: Mild R circumduction during swing, limited R knee flexion during swing. Mild antalgic gait pattern with RLE   TODAY'S TREATMENT:                                                                                                                              DATE:  03/21/23 Supine: SAQ with 2# AW 2 x 10 SAQ to SLR with 2# AW 2 x 10 Side lying hip ABD 3 x 5 Standing hip ABD at counter on 2" step, 2 x 10 bilaterally  TKE with YTB on 2" step, 3 x 15 Standing balance in lunge with front foot on foam pad 2 x 20" each side Alternating forward stepping onto foam pad 2 x 20 total Recumbent bike 5' semi circles, seat 9  03/16/2023 Progress note Supine: STM and manual ROM to right knee in supine; patellar glides (medial, lateral, superior) Sitting contract relax x5' to improve soft tissue extensibility and right knee mobility. Manual distraction  : 427 ft 30 sec STS: 14x   03/14/2023 Supine: STM and manual ROM to right knee in supine; patellar glides in every direction  SAQ 3x10 with 3" hold, 2# AW SAQ with SLR 2x10  Sitting with contract relax  x10' to improve soft tissue extensibility and right knee mobility. Manual distraction at the end. LAQ with manual resistance 2x10 Sit to stand / minisquat to mat 2x15 Knee drives on 7" step 5x10" hold at stairs  Step up 7" x10  03/08/2023 Supine: STM and manual ROM to right knee in supine; patellar glides in every direction  SAQ with manual resistance 3 sets 5x3" hold Sitting with contract relax x10' to improve soft tissue extensibility and right knee mobility. Manual distraction at the end. LAQ with manual resistance 2x10 Sit to stand / minisquat to mat 2x15 Knee drives on 6" step 5x10" hold in  parallel bars  Gait training 3'  03/07/2023 Started with moist heat over knee 5' SLR x15 with quad set STM and manual ROM to right knee in supine and sitting with contract relax x10' to improve soft tissue extensibility and right knee mobility. Manual distraction at the end. LAQ with manual resistance 2x10 Standing hip ABD at counter 2x10 Heel raises to fatigue 2x Knee drives on 6" step 5x10" hold in parallel bars  Forward step ups 4" x15 Mini squats x15   03/03/2023 Started with moist heat over knee 5' STM and man ROM to right knee in supine and sitting with contract relax x10' to improve soft tissue extensibility and right knee mobility. Manual distraction at the end. LAQ with manual resistance 2x10 Heel raises with toes on incline 2x20 Knee drives on 4" step 10x5" hold in parallel bars - ROM measurement in standing 78 Lateral step up on 4" step 2x10, 1 UE support  03/01/2023 STM and man ROM to right knee in supine and sitting with contract relax x15' to improve soft tissue extensibility and right knee mobility SAQ with manual resistance 2x10 SAQ with SLR 2 x 10 Knee drives on 4" step 5x10" hold in parallel bars Step ups x 15 on 4" Bike seat 8 x 3' for knee ROM half revolutions  02/24/2023 Supine: STM and manual ROM right knee for 25' for right knee flexion and extension; seated contract relax for quads and hamstrings Supine -2 to 78; sitting knee flexion AAROM 82 degrees Standing: Knee drives on 7" step for flexion  2' Bike seat 8 x 3' for knee ROM half revolutions  02/22/23: Seated: knee hang with AAROM 5x 20" FOTO: 76% STM and retro grade massage for edema control x with LE elevated MMT See above Prone: Quad stretch with rope 3x 30" : 386 30 seconds chair stand test: 13x Knee drive on 2nd step (45WU) AROM 3-77 degrees  02/17/2023 STM and man ROM to right knee in supine and sitting with contract relax x15' to improve soft tissue extensibility and right  knee mobility Supine SLR 2 x until fatigue SAQ 2 x until fatigue  Modified thomas stretch 2x30 sec paired with x15 LAQ between sets Mini squats to elevated mat 3x15    02/15/23 STM and man ROM to right knee in supine and sitting with contract relax x15' to improve soft tissue extensibility and right knee mobility Heel slides AAROM with strap 3' Squats to elevated mat 2x15;  Heel raises 2x15 with hand support Education about exercise modification, scar massage, and mobility at home  Measured knee flexion in supine at EOS. 80 AROM, 83 AAROM   02/10/2023 STM and man ROM to right knee in supine and sitting with contract relax x 10' to improve soft tissue extensibility and right knee mobility Standing: 12" box Right knee drives for flexion x 2'  Heel raises 2 x 10 Squats 2 x 10 Nustep seat 8 arms 10 for mobility x 5' AROM right knee in supine -3 to 75 Seated heel slides x 5 with overpressure from left leg   02/08/23 Manual in supine. Retrograde massage over quads, hamstrings, and calf. Patella mobs all directions. Tender to palpation over later joint line and medial hamstring insertion.  Supine:  Quad sets 10x5"  TKE 15 x 5" on half bolster (Short arc quad)  SLR with quad set prior raise 10x  Heel Slides x 10 with rope  PROM: knee ext: -3 degrees Standing:  Knee drive for flexion on 8in step height 5x 20"  Step ups on 4in step height 20x Step navigation (up 8in and down 4in) Gait training with and without [facility]   02/03/23: Gait training with SPC, cueing for UE and sequence with initial 3 point step to to 2 point step thru 2RT inside // bars for heel strike, swing through, toe push off, knee flexion  Manual:  Supine with LE elevated  Retrograde massage  Patella mobs all directions Supine:  Quad sets 10x 5"  TKE 15 x 5" on half bolster  SLR with quad set prior raise 10x  Heel slide 10x 5-10" holds at end range   AROM 4- 68 degrees Seated: AAROM heel slide 10x  10" Standing:  Knee drive for flexion on 8in step height 5x 20" Bike seat 10 rocking x 5'  02/01/23 Seated: Leg dangle for flexion Calf/hamstring stretch with strap 2 x 30" Supine: STM to right knee to increase soft tissue mobility and joint ROM x 10' AROM right knee flexion 60 degrees; 63 AAROM SAQ x 10 SAQ into SLR  x 10  Standing: Knee drives for flexion x 2' on 7" step Heel toe raises x 10   01/27/2023  -Seated knee flexion active stretch 5 x 30'' hold- cues for reduced trunk lean compensation. -Heel slides with a strap x 10 x 3 with 30" hold on flexion  -Supine R SLR x 15 with tactile cues at distal foot for improved knee extesion -Seated LAQ 10 x 3  -Standing weight shifts x 10 bilaterally with symmetrical foot placement tactile cues at hips within Rw.  -Elevated sit/stands with even foot placement and no UE support 2 x 10- 6/10 pain. Cues for symmetry.   01/25/23 NuStep, seat 10 , arm 8, level 1 x 5' Heel slides with a strap x 10 x 3 with 30" hold on flexion on the last rep Supine R SLR x 10 x 2 R LAQ x 3" x 10 x 2 Heel/toe raises with UE support x 10 x 2 Standing hip abd/ext  with UE support x 10 x 2   01/20/2023 PT Evaluation, findings, HEP, prognosis, and frequency   PATIENT EDUCATION:  Education details: see below Person educated: Patient Education method: Explanation and Demonstration Education comprehension: verbalized understanding  HOME EXERCISE PROGRAM: Access Code: [email or web address] URL: [email or web address] 01/25/2023 - Supine Active Straight Leg Raise  - 1-2 x daily - 7 x weekly - 2 sets - 10 reps - Heel Toe Raises with Counter Support  - 1-2 x daily - 7 x weekly - 2 sets - 10 reps - Seated Long Arc Quad  - 1-2 x daily - 7 x weekly - 2 sets - 10 reps - 3 hold - Standing Hip Extension with Counter Support  - 1-2 x daily - 7 x weekly - 2 sets - 10 reps - Standing Hip Abduction  with Counter Support  - 1-2 x daily - 7 x weekly - 2 sets - 10  reps  Date: 01/20/2023 Prepared by: Starling Manns  Exercises - Supine Heel Slide  - 1 x daily - 7 x weekly - 3 sets - 10 reps - Seated Knee Flexion Extension AROM   - 1 x daily - 7 x weekly - 3 sets - 10 reps - Supine Gluteal Sets  - 1 x daily - 7 x weekly - 3 sets - 10 reps - Supine Knee Extension Stretch on Towel Roll  - 1 x daily - 7 x weekly - 3 sets - 10 reps  ASSESSMENT:  CLINICAL IMPRESSION: Today's session focus on end range quad strengthening, standing exercises, and introduced some balance exercises. Patient is unable to attain full hip extension during side lying hip ABD with noticeable right hip weakness. Standing hip ABD tolerated well with minimal compensation. Standing balance exercises demonstrate good static stability. Stepping onto and off the foam pad needed hand held assistance to stay steady. Patient is making good progress with daily activity tolerance but remains limited with knee flexion. Improving standing balance, hip strength, and knee ROM remain the primary focus. Patient will continue to benefit from skilled intervention to address unmet goals, promote return to optimal function, and increase knee ROM.       OBJECTIVE IMPAIRMENTS: Abnormal gait, decreased activity tolerance, decreased balance, decreased mobility, difficulty walking, decreased ROM, decreased strength, hypomobility, increased fascial restrictions, impaired flexibility, and pain.   ACTIVITY LIMITATIONS: carrying, lifting, bending, sitting, standing, squatting, stairs, transfers, and locomotion level  PARTICIPATION LIMITATIONS: driving, shopping, community activity, occupation, and yard work  PERSONAL FACTORS: Age are also affecting patient's functional outcome.   REHAB POTENTIAL: Good  CLINICAL DECISION MAKING: Stable/uncomplicated  EVALUATION COMPLEXITY: Low   GOALS: Goals reviewed with patient? No  SHORT TERM GOALS: Target date: 11/7/024  Pt will be independent with HEP in order to  demonstrate participation in Physical Therapy POC.  Baseline:  Reports compliance with HEP Goal status: MET  2.  Pt will report 3/10 pain improvement during functional activity in order to demonstrate improved pain with functional activities.  Baseline: Reports increased pain with stretches 7/10 03/16/23: No pain during the day; 3/10 at night. Goal status: MET  LONG TERM GOALS: Target date: 03/17/2023  Pt will improve 30 second chair test by > 2 reps in order to demonstrate improved functional strength to return to desired activities.  Baseline: 8x; 02/22/23: 13x no HHA 03/16/23: 14x Goal status: MET  2.  Pt will improve 2 MWT by >181ft with LRAD in order to demonstrate improved functional ambulatory capacity in community setting.  Baseline: 256ft with RW; 02/22/23: 386 ft no AD 03/16/23: 427 ft Goal status: MET  3.  Pt will improve FOTO score by > 5 points in order to demonstrate improved pain with functional goals and outcomes. Baseline: 50.1 02/22/23: 76% Goal status: MET  4.  Pt will improve R knee extension ROM to 0 degrees in order to improve knee mobility during functional activities. Baseline: -5 degrees from 0; 02/22/23: AROM 3-77 degrees 03/16/23: AAROM 3-90 Goal status: IN PROGRESS  5.  Pt will improve R knee flexion ROM to >110 degrees in order to improve knee mobility during functional activities. Baseline: 45 degrees currently; 02/22/23: AROM 3-77 degrees 03/16/23: AAROM 3-90 Goal status: IN PROGRESS    PLAN:   PT FREQUENCY: 2x/week  PT DURATION: 8 weeks  PLANNED INTERVENTIONS: 97146- PT Re-evaluation, 97110-Therapeutic exercises, 97530-  Therapeutic activity, [id number]- Neuromuscular re-education, [phone]- Self Care, [id number]- Manual therapy, [phone]- Gait training, Patient/Family education, Balance training, Stair training, and Joint mobilization  PLAN FOR NEXT SESSION: Follow up on MD appointment. Progress exercises as tolerated with emphasis on R knee AROM & PROM  (especially flexion); Continue to add standing balance and gait exercises to improve functional activities.    8:50 AM, 03/21/23 Karna Christmas, SPT  " I agree with the following treatment note after reviewing documentation. This session was performed under the supervision of a licensed clinician."    8:50 AM, 03/21/23 Amy Small Lynch MPT [facility] [place] [phone]

## 2023-03-23 ENCOUNTER — Ambulatory Visit (INDEPENDENT_AMBULATORY_CARE_PROVIDER_SITE_OTHER): Payer: Medicare HMO | Admitting: Orthopedic Surgery

## 2023-03-23 DIAGNOSIS — Z96651 Presence of right artificial knee joint: Secondary | ICD-10-CM

## 2023-03-24 ENCOUNTER — Encounter: Payer: Self-pay | Admitting: Orthopedic Surgery

## 2023-03-24 NOTE — Progress Notes (Signed)
Post-Op Visit Note   Patient: Victoria Reyes           Date of Birth: 20-Nov-1950           MRN: [id number] Visit Date: 03/23/2023 PCP: Kerri Perches, MD   Assessment & Plan:  Chief Complaint:  Chief Complaint  Patient presents with   Right Knee - Routine Post Op    RIGHT TKA (surgery date 12-28-22)   Visit Diagnoses:  1. S/P total knee arthroplasty, right     Plan: Little is a 72 year old patient who is now about 3 months out right total knee replacement.  Had a little trouble gaining motion.  She wanted to work on it on her own.  On examination today she has range of motion of 0-90.  Extensor mechanism intact.  Overall she has started back with physical therapy 2 times a week.  I think she is doing well and has a range of motion which is functional and sufficient for her.  25-month return final check.  Follow-Up Instructions: No follow-ups on file.   Orders:  No orders of the defined types were placed in this encounter.  No orders of the defined types were placed in this encounter.   Imaging: No results found.  PMFS History: Patient Active Problem List   Diagnosis Date Noted   OA (osteoarthritis) of knee 12/28/2022   S/P total knee arthroplasty, right 12/28/2022   Low vitamin D level 11/01/2022   Leg swelling 06/28/2022   Giardiasis 08/23/2021   Tapeworm infection 08/23/2021   Leg pain, left 11/17/2020   Alopecia 05/06/2020   History of Helicobacter pylori infection 04/09/2020   Anemia 04/09/2020   History of adenomatous polyp of colon 04/09/2020   Gastric ulcer 01/29/2020   Duodenal ulcer 01/29/2020   Sacro ilial pain 11/08/2019   Chronic back pain 09/11/2019   Immunization refused 03/11/2019   Encounter for annual physical exam 07/10/2015   Cutaneous wart 09/30/2014   Hyperlipidemia with target LDL less than 100 05/04/2012   GERD (gastroesophageal reflux disease) 03/14/2011   Chronic right-sided low back pain with right-sided sciatica 06/25/2009    Morbid obesity (HCC) 02/17/2007   Hypothyroidism 06/14/2006   Diverticulosis of colon 03/01/2006   HIATAL HERNIA WITH REFLUX, HX OF 03/01/2006   Past Medical History:  Diagnosis Date   Anemia    as a  young woman   Diverticulitis 2005   CT    DJD (degenerative joint disease)    GERD (gastroesophageal reflux disease)    H. pylori infection 01/2020   S/p treatment with omeprazole, amoxicillin, and Biaxin.   Headache(784.0)    Hyperlipidemia    Hypothyroidism    Knee pain, right    Low back pain    Neuropathy    peroneal nerve   Neuropathy    left lower leg from back surgery per patient   Obesity    Pneumonia    as child   S/P colonoscopy 2005   Dr. Katrinka Blazing: sigmoid diverticulosis   Thyroid disease    Tobacco abuse     Family History  Problem Relation Age of Onset   Heart attack Sister        pacemaker   Kidney failure Sister        on dialysis   Colon cancer Neg Hx    Stomach cancer Neg Hx    Esophageal cancer Neg Hx     Past Surgical History:  Procedure Laterality Date   BACK SURGERY  BIOPSY  01/29/2020   Procedure: BIOPSY;  Surgeon: Dolores Frame, MD;  [facility];  Service: Gastroenterology;;   BIOPSY  05/13/2020   Procedure: BIOPSY;  Surgeon: Lanelle Bal, DO;  Location: [facility] ENDOSCOPY;  Service: Endoscopy;;   COLONOSCOPY  12/18/2010   Procedure: COLONOSCOPY;  Surgeon: Arlyce Harman, MD; 3 mm sessile polyp at hepatic flexure, pancolonic diverticulosis, internal hemorrhoids, anal lesion just above the dentate line biopsied via cold forceps.  Colonic polyp was a tubular adenoma.  Anus biopsy with mild active inflammation associated with reactive epithelial changes and hemorrhage.  Recommended repeat colonoscopy in 2022.   COLONOSCOPY WITH PROPOFOL N/A 05/13/2020   Procedure: COLONOSCOPY WITH PROPOFOL;  Surgeon: Lanelle Bal, DO;  Location: [facility] ENDOSCOPY;  Service: Endoscopy;  Laterality: N/A;  12:30PM   cyst removal right wrist      ESOPHAGOGASTRODUODENOSCOPY (EGD) WITH PROPOFOL N/A 01/29/2020   Procedure: ESOPHAGOGASTRODUODENOSCOPY (EGD) WITH PROPOFOL;  Surgeon: Marguerita Merles, Reuel Boom, MD;   3 gastric ulcers with clean base, 5 ulcers in the duodenal bulb and first portion of duodenum, 2 were noted to be oozing, treated with bipolar cautery.  Gastric biopsies consistent with H. pylori gastritis.   ESOPHAGOGASTRODUODENOSCOPY (EGD) WITH PROPOFOL N/A 05/13/2020   Procedure: ESOPHAGOGASTRODUODENOSCOPY (EGD) WITH PROPOFOL;  Surgeon: Lanelle Bal, DO;  Location[facility] ENDOSCOPY;  Service: Endoscopy;  Laterality: N/A;   EYE SURGERY Bilateral    cataract surgery with lens implant   POLYPECTOMY  05/13/2020   Procedure: POLYPECTOMY;  Surgeon: Lanelle Bal, DO;  Location: [facility] ENDOSCOPY;  Service: Endoscopy;;   TONSILLECTOMY     TOTAL KNEE ARTHROPLASTY Right 12/28/2022   Procedure: TOTAL KNEE ARTHROPLASTY;  Surgeon: Cammy Copa, MD;  Location: [facility] OR;  Service: Orthopedics;  Laterality: Right;   TOTAL SHOULDER ARTHROPLASTY Left 12/21/2016   Procedure: TOTAL SHOULDER ARTHROPLASTY;  Surgeon: Cammy Copa, MD;  Location: [facility] OR;  Service: Orthopedics;  Laterality: Left;   TUBAL LIGATION     umbillical hernia     Social History   Occupational History   Not on file  Tobacco Use   Smoking status: Former    Current packs/day: 0.00    Average packs/day: 0.3 packs/day for 51.9 years (13.0 ttl pk-yrs)    Types: Cigarettes    Start date: 09/30/1963    Quit date: 08/15/2015    Years since quitting: 7.6   Smokeless tobacco: Never  Vaping Use   Vaping status: Never Used  Substance and Sexual Activity   Alcohol use: No    Alcohol/week: 0.0 standard drinks of alcohol   Drug use: No   Sexual activity: Not Currently    Birth control/protection: Post-menopausal

## 2023-03-25 ENCOUNTER — Ambulatory Visit (HOSPITAL_COMMUNITY): Payer: Medicare HMO

## 2023-03-25 DIAGNOSIS — Z7409 Other reduced mobility: Secondary | ICD-10-CM | POA: Diagnosis not present

## 2023-03-25 DIAGNOSIS — M25661 Stiffness of right knee, not elsewhere classified: Secondary | ICD-10-CM | POA: Diagnosis not present

## 2023-03-25 DIAGNOSIS — M1711 Unilateral primary osteoarthritis, right knee: Secondary | ICD-10-CM | POA: Diagnosis not present

## 2023-03-25 NOTE — Therapy (Cosign Needed Addendum)
OUTPATIENT PHYSICAL THERAPY LOWER EXTREMITY TREATMENT   Patient Name: Victoria Reyes MRN: [id number] DOB:26-Sep-1950, 72 y.o., female Today's Date: 03/25/2023  END OF SESSION:  PT End of Session - 03/25/23 0801     Visit Number 19    Number of Visits 20    Date for PT Re-Evaluation 04/15/23    Authorization Type [employer] medicare (16 visits 11/21 - 1/31)    Authorization Time Period 11/21 to 1/31    Authorization - Visit Number 6    Authorization - Number of Visits 16    Progress Note Due on Visit 16    PT Start Time 0801    PT Stop Time 0845    PT Time Calculation (min) 44 min    Activity Tolerance Patient tolerated treatment well    Behavior During Therapy [facility] for tasks assessed/performed              Past Medical History:  Diagnosis Date   Anemia    as a  young woman   Diverticulitis 2005   CT    DJD (degenerative joint disease)    GERD (gastroesophageal reflux disease)    H. pylori infection 01/2020   S/p treatment with omeprazole, amoxicillin, and Biaxin.   Headache(784.0)    Hyperlipidemia    Hypothyroidism    Knee pain, right    Low back pain    Neuropathy    peroneal nerve   Neuropathy    left lower leg from back surgery per patient   Obesity    Pneumonia    as child   S/P colonoscopy 2005   Dr. Katrinka Blazing: sigmoid diverticulosis   Thyroid disease    Tobacco abuse    Past Surgical History:  Procedure Laterality Date   BACK SURGERY     BIOPSY  01/29/2020   Procedure: BIOPSY;  Surgeon: Dolores Frame, MD;  [facility];  Service: Gastroenterology;;   BIOPSY  05/13/2020   Procedure: BIOPSY;  Surgeon: Lanelle Bal, DO;  Location: [facility] ENDOSCOPY;  Service: Endoscopy;;   COLONOSCOPY  12/18/2010   Procedure: COLONOSCOPY;  Surgeon: Arlyce Harman, MD; 3 mm sessile polyp at hepatic flexure, pancolonic diverticulosis, internal hemorrhoids, anal lesion just above the dentate line biopsied via cold forceps.  Colonic polyp was a tubular  adenoma.  Anus biopsy with mild active inflammation associated with reactive epithelial changes and hemorrhage.  Recommended repeat colonoscopy in 2022.   COLONOSCOPY WITH PROPOFOL N/A 05/13/2020   Procedure: COLONOSCOPY WITH PROPOFOL;  Surgeon: Lanelle Bal, DO;  Location: [facility] ENDOSCOPY;  Service: Endoscopy;  Laterality: N/A;  12:30PM   cyst removal right wrist     ESOPHAGOGASTRODUODENOSCOPY (EGD) WITH PROPOFOL N/A 01/29/2020   Procedure: ESOPHAGOGASTRODUODENOSCOPY (EGD) WITH PROPOFOL;  Surgeon: Marguerita Merles, Reuel Boom, MD;   3 gastric ulcers with clean base, 5 ulcers in the duodenal bulb and first portion of duodenum, 2 were noted to be oozing, treated with bipolar cautery.  Gastric biopsies consistent with H. pylori gastritis.   ESOPHAGOGASTRODUODENOSCOPY (EGD) WITH PROPOFOL N/A 05/13/2020   Procedure: ESOPHAGOGASTRODUODENOSCOPY (EGD) WITH PROPOFOL;  Surgeon: Lanelle Bal, DO;  Location: [facility] ENDOSCOPY;  Service: Endoscopy;  Laterality: N/A;   EYE SURGERY Bilateral    cataract surgery with lens implant   POLYPECTOMY  05/13/2020   Procedure: POLYPECTOMY;  Surgeon: Lanelle Bal, DO;  Location: [facility] ENDOSCOPY;  Service: Endoscopy;;   TONSILLECTOMY     TOTAL KNEE ARTHROPLASTY Right 12/28/2022   Procedure: TOTAL KNEE ARTHROPLASTY;  Surgeon: August Saucer,  Corrie Mckusick, MD;  Location: [facility] OR;  Service: Orthopedics;  Laterality: Right;   TOTAL SHOULDER ARTHROPLASTY Left 12/21/2016   Procedure: TOTAL SHOULDER ARTHROPLASTY;  Surgeon: Cammy Copa, MD;  Location: [facility] OR;  Service: Orthopedics;  Laterality: Left;   TUBAL LIGATION     umbillical hernia     Patient Active Problem List   Diagnosis Date Noted   OA (osteoarthritis) of knee 12/28/2022   S/P total knee arthroplasty, right 12/28/2022   Low vitamin D level 11/01/2022   Leg swelling 06/28/2022   Giardiasis 08/23/2021   Tapeworm infection 08/23/2021   Leg pain, left 11/17/2020   Alopecia 05/06/2020   History of Helicobacter pylori  infection 04/09/2020   Anemia 04/09/2020   History of adenomatous polyp of colon 04/09/2020   Gastric ulcer 01/29/2020   Duodenal ulcer 01/29/2020   Sacro ilial pain 11/08/2019   Chronic back pain 09/11/2019   Immunization refused 03/11/2019   Encounter for annual physical exam 07/10/2015   Cutaneous wart 09/30/2014   Hyperlipidemia with target LDL less than 100 05/04/2012   GERD (gastroesophageal reflux disease) 03/14/2011   Chronic right-sided low back pain with right-sided sciatica 06/25/2009   Morbid obesity (HCC) 02/17/2007   Hypothyroidism 06/14/2006   Diverticulosis of colon 03/01/2006   HIATAL HERNIA WITH REFLUX, HX OF 03/01/2006    PCP: Syliva Overman MD  REFERRING PROVIDER:   Cammy Copa, MD    REFERRING DIAG: M17.11 (ICD-10-CM) - Unilateral primary osteoarthritis, right knee   THERAPY DIAG:  Impaired functional mobility, balance, gait, and endurance  Decreased range of motion (ROM) of right knee  Rationale for Evaluation and Treatment: Rehabilitation  ONSET DATE: 12/28/22  SUBJECTIVE:   SUBJECTIVE STATEMENT: Patient went to see her surgeon 2 days ago. Will return in 3 months in March to do final follow up. Able to drive to clinic today. Not having pain today. Still from the cold weather.  EVAL: Coming in for R TKA. 17th of this Month. Pt received home health PT. Pt reports getting around the house just fine, badges are starting to come off. Pt lives with daughter/son and grandchildren. Before the knee replacement, pt was independent with functional mobility and ADLs. Pt currently utilizing AD for ambulation, not driving currently. Assist from daugther for transfers into tub. Pt known to this clinc with prior TSA.   PERTINENT HISTORY: Lumbar fusion PAIN:  Are you having pain? Yes: NPRS scale: 5/10 Pain location: R knee Pain description: dull pain Aggravating factors: nothing Relieving factors: pain medications, ice  PRECAUTIONS: None  RED  FLAGS: None   WEIGHT BEARING RESTRICTIONS: No  FALLS:  Has patient fallen in last 6 months? No  LIVING ENVIRONMENT: Lives with: lives with their family Lives in: House/apartment Stairs: Yes: External: 4 steps; can reach both Has following equipment at home: Dan Humphreys - 2 wheeled  OCCUPATION: Retired  PLOF: Independent  PATIENT GOALS: "go get grand-baby at school"  NEXT MD VISIT: 02/07/23  OBJECTIVE:  Note: Objective measures were completed at Evaluation unless otherwise noted.  DIAGNOSTIC FINDINGS:   PATIENT SURVEYS:  FOTO 50.1% 03/16/23: 71.9  COGNITION: Overall cognitive status: Within functional limits for tasks assessed     SENSATION: WFL  POSTURE: No Significant postural limitations  PALPATION: Not tender to Palpation  LOWER EXTREMITY ROM:  Active ROM Right eval Left eval Right 02/01/23 Right 02/10/23 Right 02/22/23 Right 03/07/23  Right 03/16/23  Hip flexion         Hip extension  Hip abduction         Hip adduction         Hip internal rotation         Hip external rotation         Knee flexion 37 110 60, 63 AAROM 75 77 85 90  Knee extension -5 0  -3 (lacking) -3 lacking -3  -3  Ankle dorsiflexion         Ankle plantarflexion         Ankle inversion         Ankle eversion          (Blank rows = not tested)  LOWER EXTREMITY MMT:  MMT Right eval Left eval Right 02/22/23 Right 03/16/23  Hip flexion   4   Hip extension   2+   Hip abduction   4   Hip adduction      Hip internal rotation      Hip external rotation      Knee flexion   2+ limited range 3+  Knee extension  4- 4- 4+  Ankle dorsiflexion      Ankle plantarflexion      Ankle inversion      Ankle eversion       (Blank rows = not tested)  FUNCTIONAL TESTS:  30 seconds chair stand test: 8x 2 minute walk test: 284ft w/ RW  02/22/23: no AD: 386 ft 30 seconds chair stand test: 13x  03/16/23: no AD: 427 ft  30 seconds chair stand test:  14x   GAIT: Distance walked: 250ft Assistive device utilized: Walker - 2 wheeled Level of assistance: Modified independence Comments: Mild R circumduction during swing, limited R knee flexion during swing. Mild antalgic gait pattern with RLE   TODAY'S TREATMENT:                                                                                                                              DATE:  03/25/23 Nustep, seat 8, 6'  SLR with heel prop quad set, 3 sec eccentric, 3 rounds until fatigue LAQ contract-relax, 2 x 15 Sit to stand, 3 sec eccentric, 2 x 15 Lateral weight shifts with single leg march, 2 x 15 Standing Hip ABD, 2 x 15 bilaterally  4" stair climbing, 5'   03/21/23 Supine: SAQ with 2# AW 2 x 10 SAQ to SLR with 2# AW 2 x 10 Side lying hip ABD 3 x 5 Standing hip ABD at counter on 2" step, 2 x 10 bilaterally  TKE with YTB on 2" step, 3 x 15 Standing balance in lunge with front foot on foam pad 2 x 20" each side Alternating forward stepping onto foam pad 2 x 20 total Recumbent bike 5' semi circles, seat 9  03/16/2023 Progress note Supine: STM and manual ROM to right knee in supine; patellar glides (medial, lateral, superior) Sitting contract relax x5' to improve soft tissue extensibility and right  knee mobility. Manual distraction  : 427 ft 30 sec STS: 14x   03/14/2023 Supine: STM and manual ROM to right knee in supine; patellar glides in every direction  SAQ 3x10 with 3" hold, 2# AW SAQ with SLR 2x10  Sitting with contract relax x10' to improve soft tissue extensibility and right knee mobility. Manual distraction at the end. LAQ with manual resistance 2x10 Sit to stand / minisquat to mat 2x15 Knee drives on 7" step 5x10" hold at stairs  Step up 7" x10  03/08/2023 Supine: STM and manual ROM to right knee in supine; patellar glides in every direction  SAQ with manual resistance 3 sets 5x3" hold Sitting with contract relax x10' to improve soft tissue  extensibility and right knee mobility. Manual distraction at the end. LAQ with manual resistance 2x10 Sit to stand / minisquat to mat 2x15 Knee drives on 6" step 5x10" hold in parallel bars  Gait training 3'  03/07/2023 Started with moist heat over knee 5' SLR x15 with quad set STM and manual ROM to right knee in supine and sitting with contract relax x10' to improve soft tissue extensibility and right knee mobility. Manual distraction at the end. LAQ with manual resistance 2x10 Standing hip ABD at counter 2x10 Heel raises to fatigue 2x Knee drives on 6" step 5x10" hold in parallel bars  Forward step ups 4" x15 Mini squats x15   03/03/2023 Started with moist heat over knee 5' STM and man ROM to right knee in supine and sitting with contract relax x10' to improve soft tissue extensibility and right knee mobility. Manual distraction at the end. LAQ with manual resistance 2x10 Heel raises with toes on incline 2x20 Knee drives on 4" step 10x5" hold in parallel bars - ROM measurement in standing 78 Lateral step up on 4" step 2x10, 1 UE support    PATIENT EDUCATION:  Education details: see below Person educated: Patient Education method: Explanation and Demonstration Education comprehension: verbalized understanding  HOME EXERCISE PROGRAM: Access Code: [email or web address] URL: [email or web address] 01/25/2023 - Supine Active Straight Leg Raise  - 1-2 x daily - 7 x weekly - 2 sets - 10 reps - Heel Toe Raises with Counter Support  - 1-2 x daily - 7 x weekly - 2 sets - 10 reps - Seated Long Arc Quad  - 1-2 x daily - 7 x weekly - 2 sets - 10 reps - 3 hold - Standing Hip Extension with Counter Support  - 1-2 x daily - 7 x weekly - 2 sets - 10 reps - Standing Hip Abduction with Counter Support  - 1-2 x daily - 7 x weekly - 2 sets - 10 reps  Date: 01/20/2023 Prepared by: Starling Manns  Exercises - Supine Heel Slide  - 1 x daily - 7 x weekly - 3 sets - 10 reps - Seated Knee  Flexion Extension AROM   - 1 x daily - 7 x weekly - 3 sets - 10 reps - Supine Gluteal Sets  - 1 x daily - 7 x weekly - 3 sets - 10 reps - Supine Knee Extension Stretch on Towel Roll  - 1 x daily - 7 x weekly - 3 sets - 10 reps  ASSESSMENT:  CLINICAL IMPRESSION: Patient reports good news from her follow up with the surgeon. MD wants her to continue to work on stairs for strength and ROM in knee flexion. Today's session started with an active warm up by introducing the NuStep. Patient tolerated movement well  and felt she could work into a tight range of flexion. Patient was moderately fatigued when adding an 3 second eccentric phase to the SLR and sit-to-stands. Still demonstrates a bit of a flexed trunk when lowering to sit. Patient able to progress to an alternating pattern when ascending and descending 4" stairs today.  Improving standing balance, hip strength, and knee ROM remain the primary focus. Patient will continue to benefit from skilled intervention to address unmet goals, promote return to optimal function, and increase knee ROM.       OBJECTIVE IMPAIRMENTS: Abnormal gait, decreased activity tolerance, decreased balance, decreased mobility, difficulty walking, decreased ROM, decreased strength, hypomobility, increased fascial restrictions, impaired flexibility, and pain.   ACTIVITY LIMITATIONS: carrying, lifting, bending, sitting, standing, squatting, stairs, transfers, and locomotion level  PARTICIPATION LIMITATIONS: driving, shopping, community activity, occupation, and yard work  PERSONAL FACTORS: Age are also affecting patient's functional outcome.   REHAB POTENTIAL: Good  CLINICAL DECISION MAKING: Stable/uncomplicated  EVALUATION COMPLEXITY: Low   GOALS: Goals reviewed with patient? No  SHORT TERM GOALS: Target date: 11/7/024  Pt will be independent with HEP in order to demonstrate participation in Physical Therapy POC.  Baseline:  Reports compliance with HEP Goal  status: MET  2.  Pt will report 3/10 pain improvement during functional activity in order to demonstrate improved pain with functional activities.  Baseline: Reports increased pain with stretches 7/10 03/16/23: No pain during the day; 3/10 at night. Goal status: MET  LONG TERM GOALS: Target date: 03/17/2023  Pt will improve 30 second chair test by > 2 reps in order to demonstrate improved functional strength to return to desired activities.  Baseline: 8x; 02/22/23: 13x no HHA 03/16/23: 14x Goal status: MET  2.  Pt will improve 2 MWT by >14ft with LRAD in order to demonstrate improved functional ambulatory capacity in community setting.  Baseline: 269ft with RW; 02/22/23: 386 ft no AD 03/16/23: 427 ft Goal status: MET  3.  Pt will improve FOTO score by > 5 points in order to demonstrate improved pain with functional goals and outcomes. Baseline: 50.1 02/22/23: 76% Goal status: MET  4.  Pt will improve R knee extension ROM to 0 degrees in order to improve knee mobility during functional activities. Baseline: -5 degrees from 0; 02/22/23: AROM 3-77 degrees 03/16/23: AAROM 3-90 Goal status: IN PROGRESS  5.  Pt will improve R knee flexion ROM to >110 degrees in order to improve knee mobility during functional activities. Baseline: 45 degrees currently; 02/22/23: AROM 3-77 degrees 03/16/23: AAROM 3-90 Goal status: IN PROGRESS    PLAN:   PT FREQUENCY: 2x/week  PT DURATION: 8 weeks  PLANNED INTERVENTIONS: 97146- PT Re-evaluation, 97110-Therapeutic exercises, 97530- Therapeutic activity, 97112- Neuromuscular re-education, 97535- Self Care, 40981- Manual therapy, [phone]- Gait training, Patient/Family education, Balance training, Stair training, and Joint mobilization  PLAN FOR NEXT SESSION: Continue active warm up on Nustep or recumbent bike. Progress exercises as tolerated with emphasis on R knee AROM & PROM (especially flexion); Focus on standing balance and gait exercises to improve  functional activities.    10:09 AM, 03/25/23 Karna Christmas, SPT  " I agree with the following treatment note after reviewing documentation. This session was performed under the supervision of a licensed clinician."    10:09 AM, 03/25/23 Amy Small Lynch MPT [facility] [place] [phone]

## 2023-03-29 ENCOUNTER — Ambulatory Visit (INDEPENDENT_AMBULATORY_CARE_PROVIDER_SITE_OTHER): Payer: Medicare HMO | Admitting: Family Medicine

## 2023-03-29 ENCOUNTER — Encounter: Payer: Self-pay | Admitting: Family Medicine

## 2023-03-29 VITALS — BP 150/80 | HR 82 | Ht 61.0 in | Wt 197.1 lb

## 2023-03-29 DIAGNOSIS — Z0001 Encounter for general adult medical examination with abnormal findings: Secondary | ICD-10-CM

## 2023-03-29 DIAGNOSIS — E038 Other specified hypothyroidism: Secondary | ICD-10-CM

## 2023-03-29 DIAGNOSIS — I10 Essential (primary) hypertension: Secondary | ICD-10-CM

## 2023-03-29 MED ORDER — SPIRONOLACTONE 25 MG PO TABS: 25.0000 mg | ORAL_TABLET | Freq: Every day | ORAL | 3 refills | Status: DC

## 2023-03-29 NOTE — Patient Instructions (Signed)
F/U  in 6 to 8 weeks re evaluate blood pressure, call if you need me sooner  Fasting chem 7 and EGFr and TSH    New for blood pressure is spironolactone one daily   Reconsider vaccines  It is important that you exercise regularly at least 30 minutes 5 times a week. If you develop chest pain, have severe difficulty breathing, or feel very tired, stop exercising immediately and seek medical attention    Thanks for choosing [facility], we consider it a privelige to serve you.

## 2023-03-30 ENCOUNTER — Ambulatory Visit (HOSPITAL_COMMUNITY): Payer: Medicare HMO

## 2023-03-30 DIAGNOSIS — M25661 Stiffness of right knee, not elsewhere classified: Secondary | ICD-10-CM | POA: Diagnosis not present

## 2023-03-30 DIAGNOSIS — Z7409 Other reduced mobility: Secondary | ICD-10-CM

## 2023-03-30 DIAGNOSIS — M1711 Unilateral primary osteoarthritis, right knee: Secondary | ICD-10-CM

## 2023-03-30 NOTE — Therapy (Signed)
OUTPATIENT PHYSICAL THERAPY LOWER EXTREMITY TREATMENT   Patient Name: DEMMI FUGATE MRN: [id number] DOB:[birth year], 72 y.o., female Today's Date: 03/30/2023  END OF SESSION:  PT End of Session - 03/30/23 1430     Visit Number 20    Number of Visits 28    Date for PT Re-Evaluation 04/15/23    Authorization Type [employer] medicare (16 visits 11/21 - 1/31)    Authorization Time Period 11/21 to 1/31    Authorization - Visit Number 7    Authorization - Number of Visits 16    Progress Note Due on Visit 16    PT Start Time 1430    PT Stop Time 1510    PT Time Calculation (min) 40 min    Activity Tolerance Patient tolerated treatment well    Behavior During Therapy [facility] for tasks assessed/performed              Past Medical History:  Diagnosis Date   Anemia    as a  young woman   Diverticulitis 2005   CT    DJD (degenerative joint disease)    GERD (gastroesophageal reflux disease)    H. pylori infection 01/2020   S/p treatment with omeprazole, amoxicillin, and Biaxin.   Headache(784.0)    Hyperlipidemia    Hypothyroidism    Knee pain, right    Low back pain    Neuropathy    peroneal nerve   Neuropathy    left lower leg from back surgery per patient   Obesity    Pneumonia    as child   S/P colonoscopy 2005   Dr. Katrinka Blazing: sigmoid diverticulosis   Thyroid disease    Tobacco abuse    Past Surgical History:  Procedure Laterality Date   BACK SURGERY     BIOPSY  01/29/2020   Procedure: BIOPSY;  Surgeon: Dolores Frame, MD;  Location: [facility];  Service: Gastroenterology;;   BIOPSY  05/13/2020   Procedure: BIOPSY;  Surgeon: Lanelle Bal, DO;  Location: [facility] ENDOSCOPY;  Service: Endoscopy;;   COLONOSCOPY  12/18/2010   Procedure: COLONOSCOPY;  Surgeon: Arlyce Harman, MD; 3 mm sessile polyp at hepatic flexure, pancolonic diverticulosis, internal hemorrhoids, anal lesion just above the dentate line biopsied via cold forceps.  Colonic polyp was a tubular  adenoma.  Anus biopsy with mild active inflammation associated with reactive epithelial changes and hemorrhage.  Recommended repeat colonoscopy in 2022.   COLONOSCOPY WITH PROPOFOL N/A 05/13/2020   Procedure: COLONOSCOPY WITH PROPOFOL;  Surgeon: Lanelle Bal, DO;  Location: [facility] ENDOSCOPY;  Service: Endoscopy;  Laterality: N/A;  12:30PM   cyst removal right wrist     ESOPHAGOGASTRODUODENOSCOPY (EGD) WITH PROPOFOL N/A 01/29/2020   Procedure: ESOPHAGOGASTRODUODENOSCOPY (EGD) WITH PROPOFOL;  Surgeon: Marguerita Merles, Reuel Boom, MD;   3 gastric ulcers with clean base, 5 ulcers in the duodenal bulb and first portion of duodenum, 2 were noted to be oozing, treated with bipolar cautery.  Gastric biopsies consistent with H. pylori gastritis.   ESOPHAGOGASTRODUODENOSCOPY (EGD) WITH PROPOFOL N/A 05/13/2020   Procedure: ESOPHAGOGASTRODUODENOSCOPY (EGD) WITH PROPOFOL;  Surgeon: Lanelle Bal, DO;  Location: [facility] ENDOSCOPY;  Service: Endoscopy;  Laterality: N/A;   EYE SURGERY Bilateral    cataract surgery with lens implant   POLYPECTOMY  05/13/2020   Procedure: POLYPECTOMY;  Surgeon: Lanelle Bal, DO;  Location: [facility] ENDOSCOPY;  Service: Endoscopy;;   TONSILLECTOMY     TOTAL KNEE ARTHROPLASTY Right 12/28/2022   Procedure: TOTAL KNEE ARTHROPLASTY;  Surgeon: August Saucer,  Corrie Mckusick, MD;  Location: [facility] OR;  Service: Orthopedics;  Laterality: Right;   TOTAL SHOULDER ARTHROPLASTY Left 12/21/2016   Procedure: TOTAL SHOULDER ARTHROPLASTY;  Surgeon: Cammy Copa, MD;  Location: [facility] OR;  Service: Orthopedics;  Laterality: Left;   TUBAL LIGATION     umbillical hernia     Patient Active Problem List   Diagnosis Date Noted   OA (osteoarthritis) of knee 12/28/2022   S/P total knee arthroplasty, right 12/28/2022   Low vitamin D level 11/01/2022   Leg swelling 06/28/2022   Giardiasis 08/23/2021   Tapeworm infection 08/23/2021   Leg pain, left 11/17/2020   Alopecia 05/06/2020   History of Helicobacter pylori  infection 04/09/2020   Anemia 04/09/2020   History of adenomatous polyp of colon 04/09/2020   Gastric ulcer 01/29/2020   Duodenal ulcer 01/29/2020   Sacro ilial pain 11/08/2019   Chronic back pain 09/11/2019   Immunization refused 03/11/2019   Encounter for annual physical exam 07/10/2015   Cutaneous wart 09/30/2014   Hyperlipidemia with target LDL less than 100 05/04/2012   GERD (gastroesophageal reflux disease) 03/14/2011   Chronic right-sided low back pain with right-sided sciatica 06/25/2009   Morbid obesity (HCC) 02/17/2007   Hypothyroidism 06/14/2006   Diverticulosis of colon 03/01/2006   HIATAL HERNIA WITH REFLUX, HX OF 03/01/2006    PCP: Syliva Overman MD  REFERRING PROVIDER:   Cammy Copa, MD    REFERRING DIAG: M17.11 (ICD-10-CM) - Unilateral primary osteoarthritis, right knee   THERAPY DIAG:  Impaired functional mobility, balance, gait, and endurance  Decreased range of motion (ROM) of right knee  Arthritis of right knee  Rationale for Evaluation and Treatment: Rehabilitation  ONSET DATE: 12/28/22  SUBJECTIVE:   SUBJECTIVE STATEMENT: "It's getting better"  EVAL: Coming in for R TKA. 17th of this Month. Pt received home health PT. Pt reports getting around the house just fine, badges are starting to come off. Pt lives with daughter/son and grandchildren. Before the knee replacement, pt was independent with functional mobility and ADLs. Pt currently utilizing AD for ambulation, not driving currently. Assist from daugther for transfers into tub. Pt known to this clinc with prior TSA.   PERTINENT HISTORY: Lumbar fusion PAIN:  Are you having pain? Yes: NPRS scale: 5/10 Pain location: R knee Pain description: dull pain Aggravating factors: nothing Relieving factors: pain medications, ice  PRECAUTIONS: None  RED FLAGS: None   WEIGHT BEARING RESTRICTIONS: No  FALLS:  Has patient fallen in last 6 months? No  LIVING ENVIRONMENT: Lives with:  lives with their family Lives in: House/apartment Stairs: Yes: External: 4 steps; can reach both Has following equipment at home: Dan Humphreys - 2 wheeled  OCCUPATION: Retired  PLOF: Independent  PATIENT GOALS: "go get grand-baby at school"  NEXT MD VISIT: 02/07/23  OBJECTIVE:  Note: Objective measures were completed at Evaluation unless otherwise noted.  DIAGNOSTIC FINDINGS:   PATIENT SURVEYS:  FOTO 50.1% 03/16/23: 71.9  COGNITION: Overall cognitive status: Within functional limits for tasks assessed     SENSATION: WFL  POSTURE: No Significant postural limitations  PALPATION: Not tender to Palpation  LOWER EXTREMITY ROM:  Active ROM Right eval Left eval Right 02/01/23 Right 02/10/23 Right 02/22/23 Right 03/07/23  Right 03/16/23 Right 03/30/23  Hip flexion          Hip extension          Hip abduction          Hip adduction  Hip internal rotation          Hip external rotation          Knee flexion 37 110 60, 63 AAROM 75 77 85 90 95  Knee extension -5 0  -3 (lacking) -3 lacking -3  -3   Ankle dorsiflexion          Ankle plantarflexion          Ankle inversion          Ankle eversion           (Blank rows = not tested)  LOWER EXTREMITY MMT:  MMT Right eval Left eval Right 02/22/23 Right 03/16/23  Hip flexion   4   Hip extension   2+   Hip abduction   4   Hip adduction      Hip internal rotation      Hip external rotation      Knee flexion   2+ limited range 3+  Knee extension  4- 4- 4+  Ankle dorsiflexion      Ankle plantarflexion      Ankle inversion      Ankle eversion       (Blank rows = not tested)  FUNCTIONAL TESTS:  30 seconds chair stand test: 8x 2 minute walk test: 248ft w/ RW  02/22/23: no AD: 386 ft 30 seconds chair stand test: 13x  03/16/23: no AD: 427 ft  30 seconds chair stand test: 14x   GAIT: Distance walked: 239ft Assistive device utilized: Walker - 2 wheeled Level of assistance: Modified  independence Comments: Mild R circumduction during swing, limited R knee flexion during swing. Mild antalgic gait pattern with RLE   TODAY'S TREATMENT:                                                                                                                              DATE:  03/30/23 Nustep, seat 8, 6' Standing: Heel raises 2 x 10 8" step  right knee drives for flexion x 2' Squats to chair for target 2 x 15 Hip abduction 2 x 10 each Hip extension 2 x 10 each  6" box step ups 2  x 10 right up; left down Seated heel slides x 20 AROM right knee flexion 95 degrees Tandem stance x 15 sec each SLS right 5" at most, left 9" at most Step navigation up and down x 2  03/25/23 Nustep, seat 8, 6'  SLR with heel prop quad set, 3 sec eccentric, 3 rounds until fatigue LAQ contract-relax, 2 x 15 Sit to stand, 3 sec eccentric, 2 x 15 Lateral weight shifts with single leg march, 2 x 15 Standing Hip ABD, 2 x 15 bilaterally  4" stair climbing, 5'   03/21/23 Supine: SAQ with 2# AW 2 x 10 SAQ to SLR with 2# AW 2 x 10 Side lying hip ABD 3 x 5 Standing hip ABD at counter on 2" step,  2 x 10 bilaterally  TKE with YTB on 2" step, 3 x 15 Standing balance in lunge with front foot on foam pad 2 x 20" each side Alternating forward stepping onto foam pad 2 x 20 total Recumbent bike 5' semi circles, seat 9  03/16/2023 Progress note Supine: STM and manual ROM to right knee in supine; patellar glides (medial, lateral, superior) Sitting contract relax x5' to improve soft tissue extensibility and right knee mobility. Manual distraction  : 427 ft 30 sec STS: 14x      PATIENT EDUCATION:  Education details: see below Person educated: Patient Education method: Explanation and Demonstration Education comprehension: verbalized understanding  HOME EXERCISE PROGRAM: Access Code: [email or web address] URL: [email or web address] 01/25/2023 - Supine Active Straight Leg Raise  - 1-2 x  daily - 7 x weekly - 2 sets - 10 reps - Heel Toe Raises with Counter Support  - 1-2 x daily - 7 x weekly - 2 sets - 10 reps - Seated Long Arc Quad  - 1-2 x daily - 7 x weekly - 2 sets - 10 reps - 3 hold - Standing Hip Extension with Counter Support  - 1-2 x daily - 7 x weekly - 2 sets - 10 reps - Standing Hip Abduction with Counter Support  - 1-2 x daily - 7 x weekly - 2 sets - 10 reps  Date: 01/20/2023 Prepared by: Starling Manns  Exercises - Supine Heel Slide  - 1 x daily - 7 x weekly - 3 sets - 10 reps - Seated Knee Flexion Extension AROM   - 1 x daily - 7 x weekly - 3 sets - 10 reps - Supine Gluteal Sets  - 1 x daily - 7 x weekly - 3 sets - 10 reps - Supine Knee Extension Stretch on Towel Roll  - 1 x daily - 7 x weekly - 3 sets - 10 reps  ASSESSMENT:  CLINICAL IMPRESSION: Today's session with continued focus on lower extremity mobility and strength.  Needs cues for postural control with hip exercises and for pacing of activity as she tends to use momentum versus control.gradually increasing with knee flexion mobility. Able to ascend steps reciprocal pattern but not descend without significant substitution.    Patient will continue to benefit from skilled intervention to address unmet goals, promote return to optimal function, and increase knee ROM.       OBJECTIVE IMPAIRMENTS: Abnormal gait, decreased activity tolerance, decreased balance, decreased mobility, difficulty walking, decreased ROM, decreased strength, hypomobility, increased fascial restrictions, impaired flexibility, and pain.   ACTIVITY LIMITATIONS: carrying, lifting, bending, sitting, standing, squatting, stairs, transfers, and locomotion level  PARTICIPATION LIMITATIONS: driving, shopping, community activity, occupation, and yard work  PERSONAL FACTORS: Age are also affecting patient's functional outcome.   REHAB POTENTIAL: Good  CLINICAL DECISION MAKING: Stable/uncomplicated  EVALUATION COMPLEXITY:  Low   GOALS: Goals reviewed with patient? No  SHORT TERM GOALS: Target date: 11/7/024  Pt will be independent with HEP in order to demonstrate participation in Physical Therapy POC.  Baseline:  Reports compliance with HEP Goal status: MET  2.  Pt will report 3/10 pain improvement during functional activity in order to demonstrate improved pain with functional activities.  Baseline: Reports increased pain with stretches 7/10 03/16/23: No pain during the day; 3/10 at night. Goal status: MET  LONG TERM GOALS: Target date: 03/17/2023  Pt will improve 30 second chair test by > 2 reps in order to demonstrate improved functional strength to return to desired activities.  Baseline: 8x; 02/22/23: 13x no HHA 03/16/23: 14x Goal status: MET  2.  Pt will improve 2 MWT by >165ft with LRAD in order to demonstrate improved functional ambulatory capacity in community setting.  Baseline: 227ft with RW; 02/22/23: 386 ft no AD 03/16/23: 427 ft Goal status: MET  3.  Pt will improve FOTO score by > 5 points in order to demonstrate improved pain with functional goals and outcomes. Baseline: 50.1 02/22/23: 76% Goal status: MET  4.  Pt will improve R knee extension ROM to 0 degrees in order to improve knee mobility during functional activities. Baseline: -5 degrees from 0; 02/22/23: AROM 3-77 degrees 03/16/23: AAROM 3-90 Goal status: IN PROGRESS  5.  Pt will improve R knee flexion ROM to >110 degrees in order to improve knee mobility during functional activities. Baseline: 45 degrees currently; 02/22/23: AROM 3-77 degrees 03/16/23: AAROM 3-90 Goal status: IN PROGRESS    PLAN:   PT FREQUENCY: 2x/week  PT DURATION: 8 weeks  PLANNED INTERVENTIONS: 97146- PT Re-evaluation, 97110-Therapeutic exercises, 97530- Therapeutic activity, 97112- Neuromuscular re-education, 97535- Self Care, [id number]- Manual therapy, [phone]- Gait training, Patient/Family education, Balance training, Stair training, and Joint  mobilization  PLAN FOR NEXT SESSION: Continue active warm up on Nustep or recumbent bike. Progress exercises as tolerated with emphasis on R knee AROM & PROM (especially flexion); Focus on standing balance and gait exercises to improve functional activities.      3:08 PM, 03/30/23 Jhett Fretwell Small Claris Guymon MPT [facility] [place] [phone]

## 2023-03-31 ENCOUNTER — Encounter (HOSPITAL_COMMUNITY): Payer: Medicare HMO

## 2023-04-01 ENCOUNTER — Ambulatory Visit (HOSPITAL_COMMUNITY): Payer: Medicare HMO

## 2023-04-01 DIAGNOSIS — M25661 Stiffness of right knee, not elsewhere classified: Secondary | ICD-10-CM | POA: Diagnosis not present

## 2023-04-01 DIAGNOSIS — Z7409 Other reduced mobility: Secondary | ICD-10-CM | POA: Diagnosis not present

## 2023-04-01 DIAGNOSIS — M1711 Unilateral primary osteoarthritis, right knee: Secondary | ICD-10-CM | POA: Diagnosis not present

## 2023-04-01 NOTE — Therapy (Signed)
OUTPATIENT PHYSICAL THERAPY LOWER EXTREMITY TREATMENT   Patient Name: Victoria Reyes MRN: [id number] DOB:12-13-1950, 72 y.o., female Today's Date: 04/01/2023  END OF SESSION:  PT End of Session - 04/01/23 0805     Visit Number 21    Number of Visits 28    Date for PT Re-Evaluation 04/15/23    Authorization Type [employer] medicare (16 visits 11/21 - 1/31)    Authorization Time Period 11/21 to 1/31    Authorization - Visit Number 8    Authorization - Number of Visits 16    Progress Note Due on Visit 16    PT Start Time 0805    PT Stop Time 0845    PT Time Calculation (min) 40 min    Activity Tolerance Patient tolerated treatment well    Behavior During Therapy [facility] for tasks assessed/performed              Past Medical History:  Diagnosis Date   Anemia    as a  young woman   Diverticulitis 2005   CT    DJD (degenerative joint disease)    GERD (gastroesophageal reflux disease)    H. pylori infection 01/2020   S/p treatment with omeprazole, amoxicillin, and Biaxin.   Headache(784.0)    Hyperlipidemia    Hypothyroidism    Knee pain, right    Low back pain    Neuropathy    peroneal nerve   Neuropathy    left lower leg from back surgery per patient   Obesity    Pneumonia    as child   S/P colonoscopy 2005   Dr. Katrinka Blazing: sigmoid diverticulosis   Thyroid disease    Tobacco abuse    Past Surgical History:  Procedure Laterality Date   BACK SURGERY     BIOPSY  01/29/2020   Procedure: BIOPSY;  Surgeon: Dolores Frame, MD;  Location: [facility];  Service: Gastroenterology;;   BIOPSY  05/13/2020   Procedure: BIOPSY;  Surgeon: Lanelle Bal, DO;  Location: [facility] ENDOSCOPY;  Service: Endoscopy;;   COLONOSCOPY  12/18/2010   Procedure: COLONOSCOPY;  Surgeon: Arlyce Harman, MD; 3 mm sessile polyp at hepatic flexure, pancolonic diverticulosis, internal hemorrhoids, anal lesion just above the dentate line biopsied via cold forceps.  Colonic polyp was a tubular  adenoma.  Anus biopsy with mild active inflammation associated with reactive epithelial changes and hemorrhage.  Recommended repeat colonoscopy in 2022.   COLONOSCOPY WITH PROPOFOL N/A 05/13/2020   Procedure: COLONOSCOPY WITH PROPOFOL;  Surgeon: Lanelle Bal, DO;  Location: [facility] ENDOSCOPY;  Service: Endoscopy;  Laterality: N/A;  12:30PM   cyst removal right wrist     ESOPHAGOGASTRODUODENOSCOPY (EGD) WITH PROPOFOL N/A 01/29/2020   Procedure: ESOPHAGOGASTRODUODENOSCOPY (EGD) WITH PROPOFOL;  Surgeon: Marguerita Merles, Reuel Boom, MD;   3 gastric ulcers with clean base, 5 ulcers in the duodenal bulb and first portion of duodenum, 2 were noted to be oozing, treated with bipolar cautery.  Gastric biopsies consistent with H. pylori gastritis.   ESOPHAGOGASTRODUODENOSCOPY (EGD) WITH PROPOFOL N/A 05/13/2020   Procedure: ESOPHAGOGASTRODUODENOSCOPY (EGD) WITH PROPOFOL;  Surgeon: Lanelle Bal, DO;  Location: [facility] ENDOSCOPY;  Service: Endoscopy;  Laterality: N/A;   EYE SURGERY Bilateral    cataract surgery with lens implant   POLYPECTOMY  05/13/2020   Procedure: POLYPECTOMY;  Surgeon: Lanelle Bal, DO;  Location: [facility] ENDOSCOPY;  Service: Endoscopy;;   TONSILLECTOMY     TOTAL KNEE ARTHROPLASTY Right 12/28/2022   Procedure: TOTAL KNEE ARTHROPLASTY;  Surgeon: August Saucer,  Corrie Mckusick, MD;  Location: [facility] OR;  Service: Orthopedics;  Laterality: Right;   TOTAL SHOULDER ARTHROPLASTY Left 12/21/2016   Procedure: TOTAL SHOULDER ARTHROPLASTY;  Surgeon: Cammy Copa, MD;  Location: [facility] OR;  Service: Orthopedics;  Laterality: Left;   TUBAL LIGATION     umbillical hernia     Patient Active Problem List   Diagnosis Date Noted   OA (osteoarthritis) of knee 12/28/2022   S/P total knee arthroplasty, right 12/28/2022   Low vitamin D level 11/01/2022   Leg swelling 06/28/2022   Giardiasis 08/23/2021   Tapeworm infection 08/23/2021   Leg pain, left 11/17/2020   Alopecia 05/06/2020   History of Helicobacter pylori  infection 04/09/2020   Anemia 04/09/2020   History of adenomatous polyp of colon 04/09/2020   Gastric ulcer 01/29/2020   Duodenal ulcer 01/29/2020   Sacro ilial pain 11/08/2019   Chronic back pain 09/11/2019   Immunization refused 03/11/2019   Encounter for annual physical exam 07/10/2015   Cutaneous wart 09/30/2014   Hyperlipidemia with target LDL less than 100 05/04/2012   GERD (gastroesophageal reflux disease) 03/14/2011   Chronic right-sided low back pain with right-sided sciatica 06/25/2009   Morbid obesity (HCC) 02/17/2007   Hypothyroidism 06/14/2006   Diverticulosis of colon 03/01/2006   HIATAL HERNIA WITH REFLUX, HX OF 03/01/2006    PCP: Syliva Overman MD  REFERRING PROVIDER:   Cammy Copa, MD    REFERRING DIAG: M17.11 (ICD-10-CM) - Unilateral primary osteoarthritis, right knee   THERAPY DIAG:  Impaired functional mobility, balance, gait, and endurance  Decreased range of motion (ROM) of right knee  Arthritis of right knee  Rationale for Evaluation and Treatment: Rehabilitation  ONSET DATE: 12/28/22  SUBJECTIVE:   SUBJECTIVE STATEMENT: On arrival; knee is sore 4/10 or so; "think it's due to the cold weather"  EVAL: Coming in for R TKA. 17th of this Month. Pt received home health PT. Pt reports getting around the house just fine, badges are starting to come off. Pt lives with daughter/son and grandchildren. Before the knee replacement, pt was independent with functional mobility and ADLs. Pt currently utilizing AD for ambulation, not driving currently. Assist from daugther for transfers into tub. Pt known to this clinc with prior TSA.   PERTINENT HISTORY: Lumbar fusion PAIN:  Are you having pain? Yes: NPRS scale: 5/10 Pain location: R knee Pain description: dull pain Aggravating factors: nothing Relieving factors: pain medications, ice  PRECAUTIONS: None  RED FLAGS: None   WEIGHT BEARING RESTRICTIONS: No  FALLS:  Has patient fallen in  last 6 months? No  LIVING ENVIRONMENT: Lives with: lives with their family Lives in: House/apartment Stairs: Yes: External: 4 steps; can reach both Has following equipment at home: Dan Humphreys - 2 wheeled  OCCUPATION: Retired  PLOF: Independent  PATIENT GOALS: "go get grand-baby at school"  NEXT MD VISIT: 02/07/23  OBJECTIVE:  Note: Objective measures were completed at Evaluation unless otherwise noted.  DIAGNOSTIC FINDINGS:   PATIENT SURVEYS:  FOTO 50.1% 03/16/23: 71.9  COGNITION: Overall cognitive status: Within functional limits for tasks assessed     SENSATION: WFL  POSTURE: No Significant postural limitations  PALPATION: Not tender to Palpation  LOWER EXTREMITY ROM:  Active ROM Right eval Left eval Right 02/01/23 Right 02/10/23 Right 02/22/23 Right 03/07/23  Right 03/16/23 Right 03/30/23  Hip flexion          Hip extension          Hip abduction  Hip adduction          Hip internal rotation          Hip external rotation          Knee flexion 37 110 60, 63 AAROM 75 77 85 90 95  Knee extension -5 0  -3 (lacking) -3 lacking -3  -3   Ankle dorsiflexion          Ankle plantarflexion          Ankle inversion          Ankle eversion           (Blank rows = not tested)  LOWER EXTREMITY MMT:  MMT Right eval Left eval Right 02/22/23 Right 03/16/23  Hip flexion   4   Hip extension   2+   Hip abduction   4   Hip adduction      Hip internal rotation      Hip external rotation      Knee flexion   2+ limited range 3+  Knee extension  4- 4- 4+  Ankle dorsiflexion      Ankle plantarflexion      Ankle inversion      Ankle eversion       (Blank rows = not tested)  FUNCTIONAL TESTS:  30 seconds chair stand test: 8x 2 minute walk test: 265ft w/ RW  02/22/23: no AD: 386 ft 30 seconds chair stand test: 13x  03/16/23: no AD: 427 ft  30 seconds chair stand test: 14x   GAIT: Distance walked: 251ft Assistive device utilized: Walker -  2 wheeled Level of assistance: Modified independence Comments: Mild R circumduction during swing, limited R knee flexion during swing. Mild antalgic gait pattern with RLE   TODAY'S TREATMENT:                                                                                                                              DATE:  04/01/23 Nustep seat 7 x 6' dynamic warm up Standing: Heel raises 2 x 10 8" step  right knee drives for flexion x 2' Squats to chair for target 2 x 15 Hip abduction 2# 2 x 10 each Hip extension 2# 2 x 10 each  6" box step ups 2  x 10 right up; left down 6" lateral box step ups x 10 Sitting: Heel slides x 2' [employer] leg press 3 plates 2 x 10   03/30/23 Nustep, seat 8, 6' Standing: Heel raises 2 x 10 8" step  right knee drives for flexion x 2' Squats to chair for target 2 x 15 Hip abduction 2 x 10 each Hip extension 2 x 10 each  6" box step ups 2  x 10 right up; left down Seated heel slides x 20 AROM right knee flexion 95 degrees Tandem stance x 15 sec each SLS right 5" at most, left 9" at most Step navigation up and down  x 2  03/25/23 Nustep, seat 8, 6'  SLR with heel prop quad set, 3 sec eccentric, 3 rounds until fatigue LAQ contract-relax, 2 x 15 Sit to stand, 3 sec eccentric, 2 x 15 Lateral weight shifts with single leg march, 2 x 15 Standing Hip ABD, 2 x 15 bilaterally  4" stair climbing, 5'        PATIENT EDUCATION:  Education details: see below Person educated: Patient Education method: Explanation and Demonstration Education comprehension: verbalized understanding  HOME EXERCISE PROGRAM: Access Code: [email or web address] URL: [email or web address] 01/25/2023 - Supine Active Straight Leg Raise  - 1-2 x daily - 7 x weekly - 2 sets - 10 reps - Heel Toe Raises with Counter Support  - 1-2 x daily - 7 x weekly - 2 sets - 10 reps - Seated Long Arc Quad  - 1-2 x daily - 7 x weekly - 2 sets - 10 reps - 3 hold - Standing Hip  Extension with Counter Support  - 1-2 x daily - 7 x weekly - 2 sets - 10 reps - Standing Hip Abduction with Counter Support  - 1-2 x daily - 7 x weekly - 2 sets - 10 reps  Date: 01/20/2023 Prepared by: Starling Manns  Exercises - Supine Heel Slide  - 1 x daily - 7 x weekly - 3 sets - 10 reps - Seated Knee Flexion Extension AROM   - 1 x daily - 7 x weekly - 3 sets - 10 reps - Supine Gluteal Sets  - 1 x daily - 7 x weekly - 3 sets - 10 reps - Supine Knee Extension Stretch on Towel Roll  - 1 x daily - 7 x weekly - 3 sets - 10 reps  ASSESSMENT:  CLINICAL IMPRESSION: Today's session with continued focus on lower extremity mobility and strength.  Added weight to hip exercise today with continued need for cues to avoid trunk substitution.  Added lateral step ups. Added leg press without issue.  No increased pain compliant during treatment.   Patient will continue to benefit from skilled intervention to address unmet goals, promote return to optimal function, and increase knee ROM.       OBJECTIVE IMPAIRMENTS: Abnormal gait, decreased activity tolerance, decreased balance, decreased mobility, difficulty walking, decreased ROM, decreased strength, hypomobility, increased fascial restrictions, impaired flexibility, and pain.   ACTIVITY LIMITATIONS: carrying, lifting, bending, sitting, standing, squatting, stairs, transfers, and locomotion level  PARTICIPATION LIMITATIONS: driving, shopping, community activity, occupation, and yard work  PERSONAL FACTORS: Age are also affecting patient's functional outcome.   REHAB POTENTIAL: Good  CLINICAL DECISION MAKING: Stable/uncomplicated  EVALUATION COMPLEXITY: Low   GOALS: Goals reviewed with patient? No  SHORT TERM GOALS: Target date: 11/7/024  Pt will be independent with HEP in order to demonstrate participation in Physical Therapy POC.  Baseline:  Reports compliance with HEP Goal status: MET  2.  Pt will report 3/10 pain improvement during  functional activity in order to demonstrate improved pain with functional activities.  Baseline: Reports increased pain with stretches 7/10 03/16/23: No pain during the day; 3/10 at night. Goal status: MET  LONG TERM GOALS: Target date: 03/17/2023  Pt will improve 30 second chair test by > 2 reps in order to demonstrate improved functional strength to return to desired activities.  Baseline: 8x; 02/22/23: 13x no HHA 03/16/23: 14x Goal status: MET  2.  Pt will improve 2 MWT by >152ft with LRAD in order to demonstrate improved functional ambulatory capacity in community setting.  Baseline: 221ft with RW; 02/22/23: 386 ft no AD 03/16/23: 427 ft Goal status: MET  3.  Pt will improve FOTO score by > 5 points in order to demonstrate improved pain with functional goals and outcomes. Baseline: 50.1 02/22/23: 76% Goal status: MET  4.  Pt will improve R knee extension ROM to 0 degrees in order to improve knee mobility during functional activities. Baseline: -5 degrees from 0; 02/22/23: AROM 3-77 degrees 03/16/23: AAROM 3-90 Goal status: IN PROGRESS  5.  Pt will improve R knee flexion ROM to >110 degrees in order to improve knee mobility during functional activities. Baseline: 45 degrees currently; 02/22/23: AROM 3-77 degrees 03/16/23: AAROM 3-90 Goal status: IN PROGRESS    PLAN:   PT FREQUENCY: 2x/week  PT DURATION: 8 weeks  PLANNED INTERVENTIONS: 97146- PT Re-evaluation, 97110-Therapeutic exercises, 97530- Therapeutic activity, 97112- Neuromuscular re-education, 97535- Self Care, 21308- Manual therapy, [phone]- Gait training, Patient/Family education, Balance training, Stair training, and Joint mobilization  PLAN FOR NEXT SESSION: Continue active warm up on Nustep or recumbent bike. Progress exercises as tolerated with emphasis on R knee AROM & PROM (especially flexion); Focus on standing balance and gait exercises to improve functional activities.      8:44 AM, 04/01/23 Danisa Kopec Small Jaramie Bastos  MPT [facility] [place] [phone]

## 2023-04-04 ENCOUNTER — Other Ambulatory Visit: Payer: Self-pay | Admitting: Family Medicine

## 2023-04-05 ENCOUNTER — Encounter (HOSPITAL_COMMUNITY): Payer: Medicare HMO

## 2023-04-07 ENCOUNTER — Encounter (HOSPITAL_COMMUNITY): Payer: Medicare HMO

## 2023-04-11 DIAGNOSIS — I10 Essential (primary) hypertension: Secondary | ICD-10-CM | POA: Insufficient documentation

## 2023-04-11 DIAGNOSIS — Z0001 Encounter for general adult medical examination with abnormal findings: Secondary | ICD-10-CM | POA: Insufficient documentation

## 2023-04-11 NOTE — Assessment & Plan Note (Signed)
Uncontrolled and this is a pattern Pt to commjit tpo adhering to medicartion prescribed DASH diet and commitment to daily physical activity for a minimum of 30 minutes discussed and encouraged, as a part of hypertension management. The importance of attaining a healthy weight is also discussed.     03/29/2023   11:13 AM 03/29/2023   11:04 AM 03/29/2023   11:01 AM 01/28/2023   11:06 AM 12/29/2022    8:34 AM 12/29/2022    5:00 AM 12/28/2022   11:00 PM  BP/Weight  Systolic BP 150 133 150 129 115 130 132  Diastolic BP 80 74 83 73 56 79 81  Wt. (Lbs)   197.08 195.04     BMI   37.24 kg/m2 36.85 kg/m2

## 2023-04-11 NOTE — Assessment & Plan Note (Signed)

## 2023-04-11 NOTE — Progress Notes (Signed)
    Victoria Reyes     MRN: [id number]      DOB: May 22, 1950  Chief Complaint  Patient presents with   Annual Exam    CPE     HPI: Patient is in for annual physical exam. Elevated blood pressure is addressed visit. Recent labs,  are reviewed. Immunization is reviewed , and  updated if needed.   PE: BP (!) 150/80   Pulse 82   Ht 5\' 1"  (1.549 m)   Wt 197 lb 1.3 oz (89.4 kg)   SpO2 94%   BMI 37.24 kg/m    Pleasant  female, alert and oriented x 3, in no cardio-pulmonary distress. Afebrile. HEENT No facial trauma or asymetry. Sinuses non tender.  Extra occullar muscles intact.. External ears normal, . Neck: supple, no adenopathy,JVD or thyromegaly.No bruits.  Chest: Clear to ascultation bilaterally.No crackles or wheezes. Non tender to palpation    Cardiovascular system; Heart sounds normal,  S1 and  S2 ,no S3.  No murmur, Peripheral pulses normal.  Abdomen: Soft, non tender,  Musculoskeletal exam:Decreased l ROM of spine, hips , shoulders and knees. deformity ,swelling and  crepitus noted. No muscle wasting or atrophy.   Neurologic: Cranial nerves 2 to 12 intact. Power, tone ,sensation normal throughout. No disturbance in gait. No tremor.  Skin: Intact, no ulceration, erythema , scaling or rash noted. Pigmentation normal throughout  Psych; Normal mood and affect. Judgement and concentration normal   Assessment & Plan:  Encounter for Medicare annual examination with abnormal findings Annual exam as documented. Counseling done  re healthy lifestyle involving commitment to 150 minutes exercise per week, heart healthy diet, and attaining healthy weight.The importance of adequate sleep also discussed. Regular seat belt use and home safety, is also discussed. Changes in health habits are decided on by the patient with goals and time frames  set for achieving them. Immunization and cancer screening needs are specifically addressed at this  visit.   Hypothyroidism Updated lab needed at/ before next visit.   Essential hypertension Uncontrolled and this is a pattern Pt to commjit tpo adhering to medicartion prescribed DASH diet and commitment to daily physical activity for a minimum of 30 minutes discussed and encouraged, as a part of hypertension management. The importance of attaining a healthy weight is also discussed.     03/29/2023   11:13 AM 03/29/2023   11:04 AM 03/29/2023   11:01 AM 01/28/2023   11:06 AM 12/29/2022    8:34 AM 12/29/2022    5:00 AM 12/28/2022   11:00 PM  BP/Weight  Systolic BP 150 133 150 129 115 130 132  Diastolic BP 80 74 83 73 56 79 81  Wt. (Lbs)   197.08 195.04     BMI   37.24 kg/m2 36.85 kg/m2

## 2023-04-11 NOTE — Assessment & Plan Note (Signed)
Updated lab needed at/ before next visit.   

## 2023-04-12 ENCOUNTER — Ambulatory Visit (HOSPITAL_COMMUNITY): Payer: Medicare HMO

## 2023-04-12 DIAGNOSIS — M1711 Unilateral primary osteoarthritis, right knee: Secondary | ICD-10-CM | POA: Diagnosis not present

## 2023-04-12 DIAGNOSIS — M25661 Stiffness of right knee, not elsewhere classified: Secondary | ICD-10-CM

## 2023-04-12 DIAGNOSIS — Z7409 Other reduced mobility: Secondary | ICD-10-CM | POA: Diagnosis not present

## 2023-04-12 NOTE — Therapy (Signed)
 OUTPATIENT PHYSICAL THERAPY LOWER EXTREMITY TREATMENT   Patient Name: Victoria Reyes MRN: [id number] DOB:[birth year], 72 y.o., female Today's Date: 04/12/2023  END OF SESSION:  PT End of Session - 04/12/23 0808     Visit Number 22    Number of Visits 28    Date for PT Re-Evaluation 04/15/23    Authorization Type [employer] medicare (16 visits 11/21 - 1/31)    Authorization Time Period 11/21 to 1/31    Authorization - Visit Number 9    Authorization - Number of Visits 16    Progress Note Due on Visit 16    PT Start Time 0807    PT Stop Time 0845    PT Time Calculation (min) 38 min    Activity Tolerance Patient tolerated treatment well    Behavior During Therapy [facility] for tasks assessed/performed              Past Medical History:  Diagnosis Date   Anemia    as a  young woman   Diverticulitis 2005   CT    DJD (degenerative joint disease)    GERD (gastroesophageal reflux disease)    H. pylori infection 01/2020   S/p treatment with omeprazole , amoxicillin , and Biaxin .   Headache(784.0)    Hyperlipidemia    Hypothyroidism    Knee pain, right    Low back pain    Neuropathy    peroneal nerve   Neuropathy    left lower leg from back surgery per patient   Obesity    Pneumonia    as child   S/P colonoscopy 2005   Dr. Claudene: sigmoid diverticulosis   Thyroid  disease    Tobacco abuse    Past Surgical History:  Procedure Laterality Date   BACK SURGERY     BIOPSY  01/29/2020   Procedure: BIOPSY;  Surgeon: Eartha Angelia Sieving, MD;  Location: [facility];  Service: Gastroenterology;;   BIOPSY  05/13/2020   Procedure: BIOPSY;  Surgeon: Cindie Carlin POUR, DO;  Location: [facility] ENDOSCOPY;  Service: Endoscopy;;   COLONOSCOPY  12/18/2010   Procedure: COLONOSCOPY;  Surgeon: Margo CHRISTELLA Haddock, MD; 3 mm sessile polyp at hepatic flexure, pancolonic diverticulosis, internal hemorrhoids, anal lesion just above the dentate line biopsied via cold forceps.  Colonic polyp was a tubular  adenoma.  Anus biopsy with mild active inflammation associated with reactive epithelial changes and hemorrhage.  Recommended repeat colonoscopy in 2022.   COLONOSCOPY WITH PROPOFOL  N/A 05/13/2020   Procedure: COLONOSCOPY WITH PROPOFOL ;  Surgeon: Cindie Carlin POUR, DO;  Location: [facility] ENDOSCOPY;  Service: Endoscopy;  Laterality: N/A;  12:30PM   cyst removal right wrist     ESOPHAGOGASTRODUODENOSCOPY (EGD) WITH PROPOFOL  N/A 01/29/2020   Procedure: ESOPHAGOGASTRODUODENOSCOPY (EGD) WITH PROPOFOL ;  Surgeon: Eartha Angelia, Sieving, MD;   3 gastric ulcers with clean base, 5 ulcers in the duodenal bulb and first portion of duodenum, 2 were noted to be oozing, treated with bipolar cautery.  Gastric biopsies consistent with H. pylori gastritis.   ESOPHAGOGASTRODUODENOSCOPY (EGD) WITH PROPOFOL  N/A 05/13/2020   Procedure: ESOPHAGOGASTRODUODENOSCOPY (EGD) WITH PROPOFOL ;  Surgeon: Cindie Carlin POUR, DO;  Location: [facility] ENDOSCOPY;  Service: Endoscopy;  Laterality: N/A;   EYE SURGERY Bilateral    cataract surgery with lens implant   POLYPECTOMY  05/13/2020   Procedure: POLYPECTOMY;  Surgeon: Cindie Carlin POUR, DO;  Location: [facility] ENDOSCOPY;  Service: Endoscopy;;   TONSILLECTOMY     TOTAL KNEE ARTHROPLASTY Right 12/28/2022   Procedure: TOTAL KNEE ARTHROPLASTY;  Surgeon: Addie,  Cordella Hamilton, MD;  Location: [facility] OR;  Service: Orthopedics;  Laterality: Right;   TOTAL SHOULDER ARTHROPLASTY Left 12/21/2016   Procedure: TOTAL SHOULDER ARTHROPLASTY;  Surgeon: Addie Cordella Hamilton, MD;  Location: [facility] OR;  Service: Orthopedics;  Laterality: Left;   TUBAL LIGATION     umbillical hernia     Patient Active Problem List   Diagnosis Date Noted   Encounter for Medicare annual examination with abnormal findings 04/11/2023   Essential hypertension 04/11/2023   OA (osteoarthritis) of knee 12/28/2022   S/P total knee arthroplasty, right 12/28/2022   Low vitamin D  level 11/01/2022   Leg swelling 06/28/2022   Giardiasis 08/23/2021    Tapeworm infection 08/23/2021   Leg pain, left 11/17/2020   Alopecia 05/06/2020   History of Helicobacter pylori infection 04/09/2020   Anemia 04/09/2020   History of adenomatous polyp of colon 04/09/2020   Gastric ulcer 01/29/2020   Duodenal ulcer 01/29/2020   Sacro ilial pain 11/08/2019   Chronic back pain 09/11/2019   Immunization refused 03/11/2019   Cutaneous wart 09/30/2014   Hyperlipidemia with target LDL less than 100 05/04/2012   GERD (gastroesophageal reflux disease) 03/14/2011   Chronic right-sided low back pain with right-sided sciatica 06/25/2009   Morbid obesity (HCC) 02/17/2007   Hypothyroidism 06/14/2006   Diverticulosis of colon 03/01/2006   HIATAL HERNIA WITH REFLUX, HX OF 03/01/2006    PCP: Antonetta Quant MD  REFERRING PROVIDER:   Addie Cordella Hamilton, MD    REFERRING DIAG: M17.11 (ICD-10-CM) - Unilateral primary osteoarthritis, right knee   THERAPY DIAG:  Impaired functional mobility, balance, gait, and endurance  Decreased range of motion (ROM) of right knee  Arthritis of right knee  Rationale for Evaluation and Treatment: Rehabilitation  ONSET DATE: 12/28/22  SUBJECTIVE:   SUBJECTIVE STATEMENT: Patient states she has been sick the past 2 weeks; has not even celebrated Christmas with her family yet; will start celebrating today; feels like her her knee is moving better/ less swollen. Knee feels stiff but no pain.  EVAL: Coming in for R TKA. 17th of this Month. Pt received home health PT. Pt reports getting around the house just fine, badges are starting to come off. Pt lives with daughter/son and grandchildren. Before the knee replacement, pt was independent with functional mobility and ADLs. Pt currently utilizing AD for ambulation, not driving currently. Assist from daugther for transfers into tub. Pt known to this clinc with prior TSA.   PERTINENT HISTORY: Lumbar fusion PAIN:  Are you having pain? Yes: NPRS scale: 5/10 Pain location: R  knee Pain description: dull pain Aggravating factors: nothing Relieving factors: pain medications, ice  PRECAUTIONS: None  RED FLAGS: None   WEIGHT BEARING RESTRICTIONS: No  FALLS:  Has patient fallen in last 6 months? No  LIVING ENVIRONMENT: Lives with: lives with their family Lives in: House/apartment Stairs: Yes: External: 4 steps; can reach both Has following equipment at home: Vannie - 2 wheeled  OCCUPATION: Retired  PLOF: Independent  PATIENT GOALS: go get grand-baby at school  NEXT MD VISIT: 02/07/23  OBJECTIVE:  Note: Objective measures were completed at Evaluation unless otherwise noted.  DIAGNOSTIC FINDINGS:   PATIENT SURVEYS:  FOTO 50.1% 03/16/23: 71.9  COGNITION: Overall cognitive status: Within functional limits for tasks assessed     SENSATION: WFL  POSTURE: No Significant postural limitations  PALPATION: Not tender to Palpation  LOWER EXTREMITY ROM:  Active ROM Right eval Left eval Right 02/01/23 Right 02/10/23 Right 02/22/23 Right 03/07/23  Right 03/16/23 Right 03/30/23  Hip flexion          Hip extension          Hip abduction          Hip adduction          Hip internal rotation          Hip external rotation          Knee flexion 37 110 60, 63 AAROM 75 77 85 90 95  Knee extension -5 0  -3 (lacking) -3 lacking -3  -3   Ankle dorsiflexion          Ankle plantarflexion          Ankle inversion          Ankle eversion           (Blank rows = not tested)  LOWER EXTREMITY MMT:  MMT Right eval Left eval Right 02/22/23 Right 03/16/23  Hip flexion   4   Hip extension   2+   Hip abduction   4   Hip adduction      Hip internal rotation      Hip external rotation      Knee flexion   2+ limited range 3+  Knee extension  4- 4- 4+  Ankle dorsiflexion      Ankle plantarflexion      Ankle inversion      Ankle eversion       (Blank rows = not tested)  FUNCTIONAL TESTS:  30 seconds chair stand test: 8x 2 minute walk test:  215ft w/ RW  02/22/23: no AD: 386 ft 30 seconds chair stand test: 13x  03/16/23: no AD: 427 ft  30 seconds chair stand test: 14x   GAIT: Distance walked: 296ft Assistive device utilized: Walker - 2 wheeled Level of assistance: Modified independence Comments: Mild R circumduction during swing, limited R knee flexion during swing. Mild antalgic gait pattern with RLE   TODAY'S TREATMENT:                                                                                                                              DATE:  04/12/23 Nustep seat 7 x 6' dynamic warm up Heel raises x 20 Slant board 5 x 10 12 box knee drives for flexion x 2' Squats to chair for target 2 x 15 2# hip abduction 2 x 10 2# hip extension 2 x 10 6 box step ups 2 x 10 6 box lateral step ups 2 x 10 Leg press 3 plates 2 x 10    04/01/23 Nustep seat 7 x 6' dynamic warm up Standing: Heel raises 2 x 10 8 step  right knee drives for flexion x 2' Squats to chair for target 2 x 15 Hip abduction 2# 2 x 10 each Hip extension 2# 2 x 10 each  6 box step ups 2  x 10 right up; left down 6 lateral box  step ups x 10 Sitting: Heel slides x 2' Bodycraft leg press 3 plates 2 x 10   03/30/23 Nustep, seat 8, 6' Standing: Heel raises 2 x 10 8 step  right knee drives for flexion x 2' Squats to chair for target 2 x 15 Hip abduction 2 x 10 each Hip extension 2 x 10 each  6 box step ups 2  x 10 right up; left down Seated heel slides x 20 AROM right knee flexion 95 degrees Tandem stance x 15 sec each SLS right 5 at most, left 9 at most Step navigation up and down x 2  03/25/23 Nustep, seat 8, 6'  SLR with heel prop quad set, 3 sec eccentric, 3 rounds until fatigue LAQ contract-relax, 2 x 15 Sit to stand, 3 sec eccentric, 2 x 15 Lateral weight shifts with single leg march, 2 x 15 Standing Hip ABD, 2 x 15 bilaterally  4 stair climbing, 5'        PATIENT EDUCATION:  Education details:  see below Person educated: Patient Education method: Explanation and Demonstration Education comprehension: verbalized understanding  HOME EXERCISE PROGRAM: Access Code: [email or web address] URL: [email or web address] 01/25/2023 - Supine Active Straight Leg Raise  - 1-2 x daily - 7 x weekly - 2 sets - 10 reps - Heel Toe Raises with Counter Support  - 1-2 x daily - 7 x weekly - 2 sets - 10 reps - Seated Long Arc Quad  - 1-2 x daily - 7 x weekly - 2 sets - 10 reps - 3 hold - Standing Hip Extension with Counter Support  - 1-2 x daily - 7 x weekly - 2 sets - 10 reps - Standing Hip Abduction with Counter Support  - 1-2 x daily - 7 x weekly - 2 sets - 10 reps  Date: 01/20/2023 Prepared by: Omega Bottcher  Exercises - Supine Heel Slide  - 1 x daily - 7 x weekly - 3 sets - 10 reps - Seated Knee Flexion Extension AROM   - 1 x daily - 7 x weekly - 3 sets - 10 reps - Supine Gluteal Sets  - 1 x daily - 7 x weekly - 3 sets - 10 reps - Supine Knee Extension Stretch on Towel Roll  - 1 x daily - 7 x weekly - 3 sets - 10 reps  ASSESSMENT:  CLINICAL IMPRESSION: Today's session with continued focus on lower extremity mobility and strength. Wearing a mask today out of caution due to being sick recently but no symptoms today.  Continues with knee flexion tightness but noted decreased swelling of the knee.    Patient will continue to benefit from skilled intervention to address unmet goals, promote return to optimal function, and increase knee ROM.       OBJECTIVE IMPAIRMENTS: Abnormal gait, decreased activity tolerance, decreased balance, decreased mobility, difficulty walking, decreased ROM, decreased strength, hypomobility, increased fascial restrictions, impaired flexibility, and pain.   ACTIVITY LIMITATIONS: carrying, lifting, bending, sitting, standing, squatting, stairs, transfers, and locomotion level  PARTICIPATION LIMITATIONS: driving, shopping, community activity, occupation, and yard  work  PERSONAL FACTORS: Age are also affecting patient's functional outcome.   REHAB POTENTIAL: Good  CLINICAL DECISION MAKING: Stable/uncomplicated  EVALUATION COMPLEXITY: Low   GOALS: Goals reviewed with patient? No  SHORT TERM GOALS: Target date: 11/7/024  Pt will be independent with HEP in order to demonstrate participation in Physical Therapy POC.  Baseline:  Reports compliance with HEP Goal status: MET  2.  Pt will report 3/10  pain improvement during functional activity in order to demonstrate improved pain with functional activities.  Baseline: Reports increased pain with stretches 7/10 03/16/23: No pain during the day; 3/10 at night. Goal status: MET  LONG TERM GOALS: Target date: 03/17/2023  Pt will improve 30 second chair test by > 2 reps in order to demonstrate improved functional strength to return to desired activities.  Baseline: 8x; 02/22/23: 13x no HHA 03/16/23: 14x Goal status: MET  2.  Pt will improve 2 MWT by >131ft with LRAD in order to demonstrate improved functional ambulatory capacity in community setting.  Baseline: 261ft with RW; 02/22/23: 386 ft no AD 03/16/23: 427 ft Goal status: MET  3.  Pt will improve FOTO score by > 5 points in order to demonstrate improved pain with functional goals and outcomes. Baseline: 50.1 02/22/23: 76% Goal status: MET  4.  Pt will improve R knee extension ROM to 0 degrees in order to improve knee mobility during functional activities. Baseline: -5 degrees from 0; 02/22/23: AROM 3-77 degrees 03/16/23: AAROM 3-90 Goal status: IN PROGRESS  5.  Pt will improve R knee flexion ROM to >110 degrees in order to improve knee mobility during functional activities. Baseline: 45 degrees currently; 02/22/23: AROM 3-77 degrees 03/16/23: AAROM 3-90 Goal status: IN PROGRESS    PLAN:   PT FREQUENCY: 2x/week  PT DURATION: 8 weeks  PLANNED INTERVENTIONS: 97146- PT Re-evaluation, 97110-Therapeutic exercises, 97530- Therapeutic  activity, 97112- Neuromuscular re-education, 97535- Self Care, [id number]- Manual therapy, [phone]- Gait training, Patient/Family education, Balance training, Stair training, and Joint mobilization  PLAN FOR NEXT SESSION: Continue active warm up on Nustep or recumbent bike. Progress exercises as tolerated with emphasis on R knee AROM & PROM (especially flexion); Focus on standing balance and gait exercises to improve functional activities. Reassess next visit     8:44 AM, 04/12/23 Larkyn Greenberger Small Cheskel Silverio MPT [place] [facility] [place] [phone]

## 2023-04-15 ENCOUNTER — Ambulatory Visit (HOSPITAL_COMMUNITY): Payer: Medicare HMO | Attending: Orthopedic Surgery

## 2023-04-15 DIAGNOSIS — M1711 Unilateral primary osteoarthritis, right knee: Secondary | ICD-10-CM | POA: Diagnosis not present

## 2023-04-15 DIAGNOSIS — M25661 Stiffness of right knee, not elsewhere classified: Secondary | ICD-10-CM | POA: Diagnosis not present

## 2023-04-15 DIAGNOSIS — Z7409 Other reduced mobility: Secondary | ICD-10-CM | POA: Insufficient documentation

## 2023-04-15 NOTE — Therapy (Signed)
 OUTPATIENT PHYSICAL THERAPY LOWER EXTREMITY TREATMENT/DISCHARGE VISIT  PHYSICAL THERAPY DISCHARGE SUMMARY  Visits from Start of Care: 23  Current functional level related to goals / functional outcomes: See below   Remaining deficits: See below   Education / Equipment: HEP   Patient agrees to discharge. Patient goals were met except for knee flexion goal; goal 110 degrees; at discharge 95 degrees. Patient is being discharged due to being pleased with the current functional level.    Patient Name: Victoria Reyes MRN: [id number] DOB:21-May-1950, 73 y.o., female Today's Date: 04/15/2023  END OF SESSION:  PT End of Session - 04/15/23 0806     Visit Number 23    Number of Visits 28    Date for PT Re-Evaluation 04/15/23    Authorization Type [employer] medicare (16 visits 11/21 - 1/31)    Authorization Time Period 11/21 to 1/31    Authorization - Visit Number 10    Authorization - Number of Visits 16    Progress Note Due on Visit 16    PT Start Time 0804    PT Stop Time 0830    PT Time Calculation (min) 26 min    Activity Tolerance Patient tolerated treatment well    Behavior During Therapy [facility] for tasks assessed/performed              Past Medical History:  Diagnosis Date   Anemia    as a  young woman   Diverticulitis 2005   CT    DJD (degenerative joint disease)    GERD (gastroesophageal reflux disease)    H. pylori infection 01/2020   S/p treatment with omeprazole , amoxicillin , and Biaxin .   Headache(784.0)    Hyperlipidemia    Hypothyroidism    Knee pain, right    Low back pain    Neuropathy    peroneal nerve   Neuropathy    left lower leg from back surgery per patient   Obesity    Pneumonia    as child   S/P colonoscopy 2005   Dr. Claudene: sigmoid diverticulosis   Thyroid  disease    Tobacco abuse    Past Surgical History:  Procedure Laterality Date   BACK SURGERY     BIOPSY  01/29/2020   Procedure: BIOPSY;  Surgeon: Eartha Angelia Sieving, MD;   Location: [facility];  Service: Gastroenterology;;   BIOPSY  05/13/2020   Procedure: BIOPSY;  Surgeon: Cindie Carlin POUR, DO;  Location: [facility] ENDOSCOPY;  Service: Endoscopy;;   COLONOSCOPY  12/18/2010   Procedure: COLONOSCOPY;  Surgeon: Margo CHRISTELLA Haddock, MD; 3 mm sessile polyp at hepatic flexure, pancolonic diverticulosis, internal hemorrhoids, anal lesion just above the dentate line biopsied via cold forceps.  Colonic polyp was a tubular adenoma.  Anus biopsy with mild active inflammation associated with reactive epithelial changes and hemorrhage.  Recommended repeat colonoscopy in 2022.   COLONOSCOPY WITH PROPOFOL  N/A 05/13/2020   Procedure: COLONOSCOPY WITH PROPOFOL ;  Surgeon: Cindie Carlin POUR, DO;  Location[facility] ENDOSCOPY;  Service: Endoscopy;  Laterality: N/A;  12:30PM   cyst removal right wrist     ESOPHAGOGASTRODUODENOSCOPY (EGD) WITH PROPOFOL  N/A 01/29/2020   Procedure: ESOPHAGOGASTRODUODENOSCOPY (EGD) WITH PROPOFOL ;  Surgeon: Eartha Angelia, Sieving, MD;   3 gastric ulcers with clean base, 5 ulcers in the duodenal bulb and first portion of duodenum, 2 were noted to be oozing, treated with bipolar cautery.  Gastric biopsies consistent with H. pylori gastritis.   ESOPHAGOGASTRODUODENOSCOPY (EGD) WITH PROPOFOL  N/A 05/13/2020   Procedure: ESOPHAGOGASTRODUODENOSCOPY (EGD) WITH  PROPOFOL ;  Surgeon: Cindie Carlin POUR, DO;  Location: [facility] ENDOSCOPY;  Service: Endoscopy;  Laterality: N/A;   EYE SURGERY Bilateral    cataract surgery with lens implant   POLYPECTOMY  05/13/2020   Procedure: POLYPECTOMY;  Surgeon: Cindie Carlin POUR, DO;  Location: [facility] ENDOSCOPY;  Service: Endoscopy;;   TONSILLECTOMY     TOTAL KNEE ARTHROPLASTY Right 12/28/2022   Procedure: TOTAL KNEE ARTHROPLASTY;  Surgeon: Addie Cordella Hamilton, MD;  Location: [facility] OR;  Service: Orthopedics;  Laterality: Right;   TOTAL SHOULDER ARTHROPLASTY Left 12/21/2016   Procedure: TOTAL SHOULDER ARTHROPLASTY;  Surgeon: Addie Cordella Hamilton, MD;  Location: [facility] OR;   Service: Orthopedics;  Laterality: Left;   TUBAL LIGATION     umbillical hernia     Patient Active Problem List   Diagnosis Date Noted   Encounter for Medicare annual examination with abnormal findings 04/11/2023   Essential hypertension 04/11/2023   OA (osteoarthritis) of knee 12/28/2022   S/P total knee arthroplasty, right 12/28/2022   Low vitamin D  level 11/01/2022   Leg swelling 06/28/2022   Giardiasis 08/23/2021   Tapeworm infection 08/23/2021   Leg pain, left 11/17/2020   Alopecia 05/06/2020   History of Helicobacter pylori infection 04/09/2020   Anemia 04/09/2020   History of adenomatous polyp of colon 04/09/2020   Gastric ulcer 01/29/2020   Duodenal ulcer 01/29/2020   Sacro ilial pain 11/08/2019   Chronic back pain 09/11/2019   Immunization refused 03/11/2019   Cutaneous wart 09/30/2014   Hyperlipidemia with target LDL less than 100 05/04/2012   GERD (gastroesophageal reflux disease) 03/14/2011   Chronic right-sided low back pain with right-sided sciatica 06/25/2009   Morbid obesity (HCC) 02/17/2007   Hypothyroidism 06/14/2006   Diverticulosis of colon 03/01/2006   HIATAL HERNIA WITH REFLUX, HX OF 03/01/2006    PCP: Antonetta Quant MD  REFERRING PROVIDER:   Addie Cordella Hamilton, MD    REFERRING DIAG: M17.11 (ICD-10-CM) - Unilateral primary osteoarthritis, right knee   THERAPY DIAG:  Impaired functional mobility, balance, gait, and endurance  Decreased range of motion (ROM) of right knee  Arthritis of right knee  Rationale for Evaluation and Treatment: Rehabilitation  ONSET DATE: 12/28/22  SUBJECTIVE:   SUBJECTIVE STATEMENT: 60 to 70% better overall  EVAL: Coming in for R TKA. 17th of this Month. Pt received home health PT. Pt reports getting around the house just fine, badges are starting to come off. Pt lives with daughter/son and grandchildren. Before the knee replacement, pt was independent with functional mobility and ADLs. Pt currently utilizing  AD for ambulation, not driving currently. Assist from daugther for transfers into tub. Pt known to this clinc with prior TSA.   PERTINENT HISTORY: Lumbar fusion PAIN:  Are you having pain? Yes: NPRS scale: 5/10 Pain location: R knee Pain description: dull pain Aggravating factors: nothing Relieving factors: pain medications, ice  PRECAUTIONS: None  RED FLAGS: None   WEIGHT BEARING RESTRICTIONS: No  FALLS:  Has patient fallen in last 6 months? No  LIVING ENVIRONMENT: Lives with: lives with their family Lives in: House/apartment Stairs: Yes: External: 4 steps; can reach both Has following equipment at home: Vannie - 2 wheeled  OCCUPATION: Retired  PLOF: Independent  PATIENT GOALS: go get grand-baby at school  NEXT MD VISIT: 02/07/23  OBJECTIVE:  Note: Objective measures were completed at Evaluation unless otherwise noted.  DIAGNOSTIC FINDINGS:   PATIENT SURVEYS:  FOTO 50.1% 03/16/23: 71.9  COGNITION: Overall cognitive status: Within functional limits for tasks assessed  SENSATION: WFL  POSTURE: No Significant postural limitations  PALPATION: Not tender to Palpation  LOWER EXTREMITY ROM:  Active ROM Right eval Left eval Right 02/01/23 Right 02/10/23 Right 02/22/23 Right 03/07/23  Right 03/16/23 Right 03/30/23 Right 04/15/23  Hip flexion           Hip extension           Hip abduction           Hip adduction           Hip internal rotation           Hip external rotation           Knee flexion 37 110 60, 63 AAROM 75 77 85 90 95 95  Knee extension -5 0  -3 (lacking) -3 lacking -3  -3  0  Ankle dorsiflexion           Ankle plantarflexion           Ankle inversion           Ankle eversion            (Blank rows = not tested)  LOWER EXTREMITY MMT:  MMT Right eval Left eval Right 02/22/23 Right 03/16/23 Right 04/15/23  Hip flexion   4    Hip extension   2+    Hip abduction   4    Hip adduction       Hip internal rotation       Hip  external rotation       Knee flexion   2+ limited range 3+ 5  Knee extension  4- 4- 4+ 5  Ankle dorsiflexion       Ankle plantarflexion       Ankle inversion       Ankle eversion        (Blank rows = not tested)  FUNCTIONAL TESTS:  30 seconds chair stand test: 8x 2 minute walk test: 239ft w/ RW  02/22/23: no AD: 386 ft 30 seconds chair stand test: 13x  03/16/23: no AD: 427 ft  30 seconds chair stand test: 14x   GAIT: Distance walked: 277ft Assistive device utilized: Walker - 2 wheeled Level of assistance: Modified independence Comments: Mild R circumduction during swing, limited R knee flexion during swing. Mild antalgic gait pattern with RLE   TODAY'S TREATMENT:                                                                                                                              DATE:  04/15/23 Progress note Nustep seat 7 x 6' dynamic warm up FOTO 91 AROM and MMT's see above  04/12/23 Nustep seat 7 x 6' dynamic warm up Heel raises x 20 Slant board 5 x 10 12 box knee drives for flexion x 2' Squats to chair for target 2 x 15 2# hip abduction 2 x 10 2# hip extension 2 x 10 6 box  step ups 2 x 10 6 box lateral step ups 2 x 10 Leg press 3 plates 2 x 10    04/01/23 Nustep seat 7 x 6' dynamic warm up Standing: Heel raises 2 x 10 8 step  right knee drives for flexion x 2' Squats to chair for target 2 x 15 Hip abduction 2# 2 x 10 each Hip extension 2# 2 x 10 each  6 box step ups 2  x 10 right up; left down 6 lateral box step ups x 10 Sitting: Heel slides x 2' Bodycraft leg press 3 plates 2 x 10   03/30/23 Nustep, seat 8, 6' Standing: Heel raises 2 x 10 8 step  right knee drives for flexion x 2' Squats to chair for target 2 x 15 Hip abduction 2 x 10 each Hip extension 2 x 10 each  6 box step ups 2  x 10 right up; left down Seated heel slides x 20 AROM right knee flexion 95 degrees Tandem stance x 15 sec each SLS right 5 at most,  left 9 at most Step navigation up and down x 2  03/25/23 Nustep, seat 8, 6'  SLR with heel prop quad set, 3 sec eccentric, 3 rounds until fatigue LAQ contract-relax, 2 x 15 Sit to stand, 3 sec eccentric, 2 x 15 Lateral weight shifts with single leg march, 2 x 15 Standing Hip ABD, 2 x 15 bilaterally  4 stair climbing, 5'        PATIENT EDUCATION:  Education details: see below Person educated: Patient Education method: Explanation and Demonstration Education comprehension: verbalized understanding  HOME EXERCISE PROGRAM: Access Code: [email or web address] URL: [email or web address] 01/25/2023 - Supine Active Straight Leg Raise  - 1-2 x daily - 7 x weekly - 2 sets - 10 reps - Heel Toe Raises with Counter Support  - 1-2 x daily - 7 x weekly - 2 sets - 10 reps - Seated Long Arc Quad  - 1-2 x daily - 7 x weekly - 2 sets - 10 reps - 3 hold - Standing Hip Extension with Counter Support  - 1-2 x daily - 7 x weekly - 2 sets - 10 reps - Standing Hip Abduction with Counter Support  - 1-2 x daily - 7 x weekly - 2 sets - 10 reps  Date: 01/20/2023 Prepared by: Omega Bottcher  Exercises - Supine Heel Slide  - 1 x daily - 7 x weekly - 3 sets - 10 reps - Seated Knee Flexion Extension AROM   - 1 x daily - 7 x weekly - 3 sets - 10 reps - Supine Gluteal Sets  - 1 x daily - 7 x weekly - 3 sets - 10 reps - Supine Knee Extension Stretch on Towel Roll  - 1 x daily - 7 x weekly - 3 sets - 10 reps  ASSESSMENT:  CLINICAL IMPRESSION: Progress note today.       OBJECTIVE IMPAIRMENTS: Abnormal gait, decreased activity tolerance, decreased balance, decreased mobility, difficulty walking, decreased ROM, decreased strength, hypomobility, increased fascial restrictions, impaired flexibility, and pain.   ACTIVITY LIMITATIONS: carrying, lifting, bending, sitting, standing, squatting, stairs, transfers, and locomotion level  PARTICIPATION LIMITATIONS: driving, shopping, community activity,  occupation, and yard work  PERSONAL FACTORS: Age are also affecting patient's functional outcome.   REHAB POTENTIAL: Good  CLINICAL DECISION MAKING: Stable/uncomplicated  EVALUATION COMPLEXITY: Low   GOALS: Goals reviewed with patient? No  SHORT TERM GOALS: Target date: 11/7/024  Pt will be independent with HEP in  order to demonstrate participation in Physical Therapy POC.  Baseline:  Reports compliance with HEP Goal status: MET  2.  Pt will report 3/10 pain improvement during functional activity in order to demonstrate improved pain with functional activities.  Baseline: Reports increased pain with stretches 7/10 03/16/23: No pain during the day; 3/10 at night. Goal status: MET  LONG TERM GOALS: Target date: 03/17/2023  Pt will improve 30 second chair test by > 2 reps in order to demonstrate improved functional strength to return to desired activities.  Baseline: 8x; 02/22/23: 13x no HHA 03/16/23: 14x Goal status: MET  2.  Pt will improve 2 MWT by >169ft with LRAD in order to demonstrate improved functional ambulatory capacity in community setting.  Baseline: 283ft with RW; 02/22/23: 386 ft no AD 03/16/23: 427 ft Goal status: MET  3.  Pt will improve FOTO score by > 5 points in order to demonstrate improved pain with functional goals and outcomes. Baseline: 50.1 02/22/23: 76% Goal status: MET  4.  Pt will improve R knee extension ROM to 0 degrees in order to improve knee mobility during functional activities. Baseline: -5 degrees from 0; 02/22/23: AROM 3-77 degrees 03/16/23: AAROM 3-90 Goal status: MET  5.  Pt will improve R knee flexion ROM to >110 degrees in order to improve knee mobility during functional activities. Baseline: 45 degrees currently; 02/22/23: AROM 3-77 degrees 03/16/23: AAROM 3-90; 04/15/23 0-95 Goal status: IN PROGRESS    PLAN:   PT FREQUENCY: 2x/week  PT DURATION: 8 weeks  PLANNED INTERVENTIONS: 97146- PT Re-evaluation, 97110-Therapeutic  exercises, 97530- Therapeutic activity, 97112- Neuromuscular re-education, 97535- Self Care, [id number]- Manual therapy, [phone]- Gait training, Patient/Family education, Balance training, Stair training, and Joint mobilization  PLAN FOR NEXT SESSION: discharge     8:31 AM, 04/15/23 Raevin Wierenga Small Ansley Stanwood MPT [facility] [place] [phone]

## 2023-04-28 ENCOUNTER — Other Ambulatory Visit: Payer: Self-pay | Admitting: Family Medicine

## 2023-05-11 DIAGNOSIS — E038 Other specified hypothyroidism: Secondary | ICD-10-CM | POA: Diagnosis not present

## 2023-05-12 ENCOUNTER — Encounter: Payer: Self-pay | Admitting: Family Medicine

## 2023-05-12 LAB — BMP8+EGFR
BUN/Creatinine Ratio: 19 (ref 12–28)
BUN: 15 mg/dL (ref 8–27)
CO2: 20 mmol/L (ref 20–29)
Calcium: 9.3 mg/dL (ref 8.7–10.3)
Chloride: 103 mmol/L (ref 96–106)
Creatinine, Ser: 0.77 mg/dL (ref 0.57–1.00)
Glucose: 83 mg/dL (ref 70–99)
Potassium: 4.3 mmol/L (ref 3.5–5.2)
Sodium: 142 mmol/L (ref 134–144)
eGFR: 81 mL/min/{1.73_m2} (ref >59)

## 2023-05-12 LAB — TSH: TSH: 14 u[IU]/mL — ABNORMAL HIGH (ref 0.450–4.500)

## 2023-05-13 ENCOUNTER — Ambulatory Visit (INDEPENDENT_AMBULATORY_CARE_PROVIDER_SITE_OTHER): Payer: Medicare HMO | Admitting: Family Medicine

## 2023-05-13 ENCOUNTER — Encounter: Payer: Self-pay | Admitting: Family Medicine

## 2023-05-13 VITALS — BP 131/72 | HR 73 | Ht 61.0 in | Wt 204.0 lb

## 2023-05-13 DIAGNOSIS — E038 Other specified hypothyroidism: Secondary | ICD-10-CM

## 2023-05-13 DIAGNOSIS — I10 Essential (primary) hypertension: Secondary | ICD-10-CM | POA: Diagnosis not present

## 2023-05-13 NOTE — Patient Instructions (Addendum)
F/u in 12  weeks, call if you need me sooner  Dose increase in synthroid is 100 mcg daily, start today  TSH, free T3 and free T4  June 20, 2023  It is important that you exercise regularly at least 30 minutes 5 times a week. If you develop chest pain, have severe difficulty breathing, or feel very tired, stop exercising immediately and seek medical attention   Think about what you will eat, plan ahead. Choose " clean, green, fresh or frozen" over canned, processed or packaged foods which are more sugary, salty and fatty. 70 to 75% of food eaten should be vegetables and fruit. Three meals at set times with snacks allowed between meals, but they must be fruit or vegetables. Aim to eat over a 12 hour period , example 7 am to 7 pm, and STOP after  your last meal of the day. Drink water,generally about 64 ounces per day, no other drink is as healthy. Fruit juice is best enjoyed in a healthy way, by EATING the fruit.   Thanks for choosing [facility], we consider it a privelige to serve you.

## 2023-05-15 NOTE — Progress Notes (Signed)
Victoria Reyes     MRN: [id number]      DOB: [birth year]  Chief Complaint  Patient presents with   Follow-up    Follow up    HPI [name] is here for follow up and re-evaluation of chronic medical conditions, medication management and review of any available recent lab and radiology data.  Preventive health is updated, specifically  Cancer screening and Immunization.   Questions or concerns regarding consultations or procedures which the PT has had in the interim are  addressed. The PT denies any adverse reactions to current medications since the last visit. Reports taking her thyroid medication inconsistently Wants to resume medication for weight  loss for upcoming wedding of her daughter, understands unable to as her thyroid is not controlled   ROS Denies recent fever or chills. Denies sinus pressure, nasal congestion, ear pain or sore throat. Denies chest congestion, productive cough or wheezing. Denies chest pains, palpitations and leg swelling Denies abdominal pain, nausea, vomiting,diarrhea or constipation.   Denies dysuria, frequency, hesitancy or incontinence. Denies joint pain, swelling and limitation in mobility. Denies headaches, seizures, numbness, or tingling. Denies depression, anxiety or insomnia. Denies skin break down or rash.   PE  BP 128/72   Pulse 73   Ht 5\' 1"  (1.549 m)   Wt 204 lb (92.5 kg)   SpO2 97%   BMI 38.55 kg/m   Patient alert and oriented and in no cardiopulmonary distress.  HEENT: No facial asymmetry, EOMI,     Neck supple .  Chest: Clear to auscultation bilaterally.  CVS: S1, S2 no murmurs, no S3.Regular rate.  ABD: Soft non tender.   Ext: No edema  MS: Adequate ROM spine, shoulders, hips and knees.  Skin: Intact, no ulcerations or rash noted.  Psych: Good eye contact, normal affect. Memory intact not anxious or depressed appearing.  CNS: CN 2-12 intact, power,  normal throughout.no focal deficits noted.   Assessment  & Plan  Essential hypertension Controlled, no change in medication DASH diet and commitment to daily physical activity for a minimum of 30 minutes discussed and encouraged, as a part of hypertension management. The importance of attaining a healthy weight is also discussed.     05/15/2023    7:20 AM 05/13/2023   10:10 AM 05/13/2023   10:09 AM 03/29/2023   11:13 AM 03/29/2023   11:04 AM 03/29/2023   11:01 AM 01/28/2023   11:06 AM  BP/Weight  Systolic BP 128 131 144 150 133 150 129  Diastolic BP 72 69 80 80 74 83 73  Wt. (Lbs)   204   197.08 195.04  BMI   38.55 kg/m2   37.24 kg/m2 36.85 kg/m2       Morbid obesity (HCC)  Patient re-educated about  the importance of commitment to a  minimum of 150 minutes of exercise per week as able.  The importance of healthy food choices with portion control discussed, as well as eating regularly and within a 12 hour window most days. The need to choose "clean , green" food 50 to 75% of the time is discussed, as well as to make water the primary drink and set a goal of 64 ounces water daily.       05/13/2023   10:09 AM 03/29/2023   11:01 AM 01/28/2023   11:06 AM  Weight /BMI  Weight 204 lb 197 lb 1.3 oz 195 lb 0.6 oz  Height 5\' 1"  (1.549 m) 5\' 1"  (1.549 m) 5\' 1"  (1.549  m)  BMI 38.55 kg/m2 37.24 kg/m2 36.85 kg/m2    Needs to focus on lifestyle change  Hypothyroidism Non compliant with medication, uncontrolled , importance of same stressed  Dose inc with rept lab in 6 weeks

## 2023-05-15 NOTE — Assessment & Plan Note (Signed)
Controlled, no change in medication DASH diet and commitment to daily physical activity for a minimum of 30 minutes discussed and encouraged, as a part of hypertension management. The importance of attaining a healthy weight is also discussed.     05/15/2023    7:20 AM 05/13/2023   10:10 AM 05/13/2023   10:09 AM 03/29/2023   11:13 AM 03/29/2023   11:04 AM 03/29/2023   11:01 AM 01/28/2023   11:06 AM  BP/Weight  Systolic BP 128 131 144 150 133 150 129  Diastolic BP 72 69 80 80 74 83 73  Wt. (Lbs)   204   197.08 195.04  BMI   38.55 kg/m2   37.24 kg/m2 36.85 kg/m2

## 2023-05-15 NOTE — Assessment & Plan Note (Signed)
Non compliant with medication, uncontrolled , importance of same stressed  Dose inc with rept lab in 6 weeks

## 2023-05-15 NOTE — Assessment & Plan Note (Signed)
  Patient re-educated about  the importance of commitment to a  minimum of 150 minutes of exercise per week as able.  The importance of healthy food choices with portion control discussed, as well as eating regularly and within a 12 hour window most days. The need to choose "clean , green" food 50 to 75% of the time is discussed, as well as to make water the primary drink and set a goal of 64 ounces water daily.       05/13/2023   10:09 AM 03/29/2023   11:01 AM 01/28/2023   11:06 AM  Weight /BMI  Weight 204 lb 197 lb 1.3 oz 195 lb 0.6 oz  Height 5\' 1"  (1.549 m) 5\' 1"  (1.549 m) 5\' 1"  (1.549 m)  BMI 38.55 kg/m2 37.24 kg/m2 36.85 kg/m2    Needs to focus on lifestyle change

## 2023-06-20 ENCOUNTER — Encounter: Payer: Self-pay | Admitting: Orthopedic Surgery

## 2023-06-20 ENCOUNTER — Ambulatory Visit (INDEPENDENT_AMBULATORY_CARE_PROVIDER_SITE_OTHER): Payer: Medicare HMO | Admitting: Orthopedic Surgery

## 2023-06-20 DIAGNOSIS — M1711 Unilateral primary osteoarthritis, right knee: Secondary | ICD-10-CM | POA: Diagnosis not present

## 2023-06-20 DIAGNOSIS — E038 Other specified hypothyroidism: Secondary | ICD-10-CM | POA: Diagnosis not present

## 2023-06-20 NOTE — Progress Notes (Unsigned)
 Office Visit Note   Patient: Victoria Reyes           Date of Birth: 1951-01-11           MRN: [id number] Visit Date: 06/20/2023 Requested by: Kerri Perches, MD [address], [address] [place],  [place] [address] PCP: Kerri Perches, MD  Subjective: Chief Complaint  Patient presents with   Right Knee - Routine Post Op    RIGHT TKA (surgery date 12-28-22)       HPI: Victoria Reyes is a 73 y.o. female who presents to the office reporting improvement in right knee range of motion.  She underwent right total knee replacement 12/28/2022.  She is adamantly opposed to any type of manipulation.  Overall patient states "I feel a lot better".  Not having any problems.  She is able to do stairs.  Concerned about a area of skin distal to the incision which has a rash..                ROS: All systems reviewed are negative as they relate to the chief complaint within the history of present illness.  Patient denies fevers or chills.  Assessment & Plan: Visit Diagnoses:  1. Unilateral primary osteoarthritis, right knee     Plan: Impression is patient is doing well with flexion past 90 degrees on her right knee replacement.  She is functional and is able to go up and down stairs and live independently.  Continue with strengthening exercises and she will follow-up as needed.  The skin concern she has is not appear to be related to her knee replacement and I would encourage her to see dermatologist if symptoms persist.  Follow-Up Instructions: No follow-ups on file.   Orders:  No orders of the defined types were placed in this encounter.  No orders of the defined types were placed in this encounter.     Procedures: No procedures performed   Clinical Data: No additional findings.  Objective: Vital Signs: There were no vitals taken for this visit.  Physical Exam:  Constitutional: Patient appears well-developed HEENT:  Head: Normocephalic Eyes:EOM are normal Neck:  Normal range of motion Cardiovascular: Normal rate Pulmonary/chest: Effort normal Neurologic: Patient is alert Skin: Skin is warm Psychiatric: Patient has normal mood and affect  Ortho Exam: Ortho exam demonstrates range of motion of that right knee from 0 to about 95.  No effusion.  Good stability and good patellar tracking.  No groin pain with internal/external Tatian of the leg.  No masses lymphadenopathy or skin changes noted in that right knee region.  The rash on the leg looks more like an area of dry skin.  No erythema induration or fluctuance around the incision.  Not particularly pruritic.  Specialty Comments:  No specialty comments available.  Imaging: No results found.   PMFS History: Patient Active Problem List   Diagnosis Date Noted   Encounter for Medicare annual examination with abnormal findings 04/11/2023   Essential hypertension 04/11/2023   OA (osteoarthritis) of knee 12/28/2022   S/P total knee arthroplasty, right 12/28/2022   Low vitamin D level 11/01/2022   Leg swelling 06/28/2022   Giardiasis 08/23/2021   Tapeworm infection 08/23/2021   Leg pain, left 11/17/2020   Alopecia 05/06/2020   History of Helicobacter pylori infection 04/09/2020   Anemia 04/09/2020   History of adenomatous polyp of colon 04/09/2020   Gastric ulcer 01/29/2020   Duodenal ulcer 01/29/2020   Sacro ilial pain 11/08/2019  Chronic back pain 09/11/2019   Immunization refused 03/11/2019   Cutaneous wart 09/30/2014   Hyperlipidemia with target LDL less than 100 05/04/2012   GERD (gastroesophageal reflux disease) 03/14/2011   Chronic right-sided low back pain with right-sided sciatica 06/25/2009   Morbid obesity (HCC) 02/17/2007   Hypothyroidism 06/14/2006   Diverticulosis of colon 03/01/2006   HIATAL HERNIA WITH REFLUX, HX OF 03/01/2006   Past Medical History:  Diagnosis Date   Anemia    as a  young woman   Diverticulitis 2005   CT    DJD (degenerative joint disease)    GERD  (gastroesophageal reflux disease)    H. pylori infection 01/2020   S/p treatment with omeprazole, amoxicillin, and Biaxin.   Headache(784.0)    Hyperlipidemia    Hypothyroidism    Knee pain, right    Low back pain    Neuropathy    peroneal nerve   Neuropathy    left lower leg from back surgery per patient   Obesity    Pneumonia    as child   S/P colonoscopy 2005   Dr. Katrinka Blazing: sigmoid diverticulosis   Thyroid disease    Tobacco abuse     Family History  Problem Relation Age of Onset   Heart attack Sister        pacemaker   Kidney failure Sister        on dialysis   Colon cancer Neg Hx    Stomach cancer Neg Hx    Esophageal cancer Neg Hx     Past Surgical History:  Procedure Laterality Date   BACK SURGERY     BIOPSY  01/29/2020   Procedure: BIOPSY;  Surgeon: Dolores Frame, MD;  Location: [facility];  Service: Gastroenterology;;   BIOPSY  05/13/2020   Procedure: BIOPSY;  Surgeon: Lanelle Bal, DO;  Location: [facility] ENDOSCOPY;  Service: Endoscopy;;   COLONOSCOPY  12/18/2010   Procedure: COLONOSCOPY;  Surgeon: Arlyce Harman, MD; 3 mm sessile polyp at hepatic flexure, pancolonic diverticulosis, internal hemorrhoids, anal lesion just above the dentate line biopsied via cold forceps.  Colonic polyp was a tubular adenoma.  Anus biopsy with mild active inflammation associated with reactive epithelial changes and hemorrhage.  Recommended repeat colonoscopy in 2022.   COLONOSCOPY WITH PROPOFOL N/A 05/13/2020   Procedure: COLONOSCOPY WITH PROPOFOL;  Surgeon: Lanelle Bal, DO;  Location: [facility] ENDOSCOPY;  Service: Endoscopy;  Laterality: N/A;  12:30PM   cyst removal right wrist     ESOPHAGOGASTRODUODENOSCOPY (EGD) WITH PROPOFOL N/A 01/29/2020   Procedure: ESOPHAGOGASTRODUODENOSCOPY (EGD) WITH PROPOFOL;  Surgeon: Marguerita Merles, Reuel Boom, MD;   3 gastric ulcers with clean base, 5 ulcers in the duodenal bulb and first portion of duodenum, 2 were noted to be oozing, treated  with bipolar cautery.  Gastric biopsies consistent with H. pylori gastritis.   ESOPHAGOGASTRODUODENOSCOPY (EGD) WITH PROPOFOL N/A 05/13/2020   Procedure: ESOPHAGOGASTRODUODENOSCOPY (EGD) WITH PROPOFOL;  Surgeon: Lanelle Bal, DO;  Location: [facility] ENDOSCOPY;  Service: Endoscopy;  Laterality: N/A;   EYE SURGERY Bilateral    cataract surgery with lens implant   POLYPECTOMY  05/13/2020   Procedure: POLYPECTOMY;  Surgeon: Lanelle Bal, DO;  Location: [facility] ENDOSCOPY;  Service: Endoscopy;;   TONSILLECTOMY     TOTAL KNEE ARTHROPLASTY Right 12/28/2022   Procedure: TOTAL KNEE ARTHROPLASTY;  Surgeon: Cammy Copa, MD;  Location: [facility] OR;  Service: Orthopedics;  Laterality: Right;   TOTAL SHOULDER ARTHROPLASTY Left 12/21/2016   Procedure: TOTAL SHOULDER ARTHROPLASTY;  Surgeon:  Cammy Copa, MD;  Location: [facility] OR;  Service: Orthopedics;  Laterality: Left;   TUBAL LIGATION     umbillical hernia     Social History   Occupational History   Not on file  Tobacco Use   Smoking status: Former    Current packs/day: 0.00    Average packs/day: 0.3 packs/day for 51.9 years (13.0 ttl pk-yrs)    Types: Cigarettes    Start date: 09/30/1963    Quit date: 08/15/2015    Years since quitting: 7.8   Smokeless tobacco: Never  Vaping Use   Vaping status: Never Used  Substance and Sexual Activity   Alcohol use: No    Alcohol/week: 0.0 standard drinks of alcohol   Drug use: No   Sexual activity: Not Currently    Birth control/protection: Post-menopausal

## 2023-06-21 ENCOUNTER — Encounter: Payer: Self-pay | Admitting: Family Medicine

## 2023-06-21 LAB — TSH: TSH: 4.86 u[IU]/mL — ABNORMAL HIGH (ref 0.450–4.500)

## 2023-06-21 LAB — T3, FREE: T3, Free: 2.2 pg/mL (ref 2.0–4.4)

## 2023-06-21 LAB — T4, FREE: Free T4: 1.37 ng/dL (ref 0.82–1.77)

## 2023-06-23 ENCOUNTER — Telehealth: Payer: Self-pay

## 2023-06-23 MED ORDER — PHENTERMINE HCL 37.5 MG PO TABS: ORAL_TABLET | ORAL | 1 refills | Status: AC

## 2023-06-23 NOTE — Telephone Encounter (Signed)
Prescription is sent in 

## 2023-06-23 NOTE — Telephone Encounter (Signed)
 Copied from CRM [id number]. Topic: Clinical - Medication Question >> Jun 23, 2023  8:05 AM Elle L wrote: Reason for CRM: The patient advised that she was on Phentermine 37.5 MG half a tablet a day before her knee replacement surgery and is requesting to see if she can have it prescribed again. She has the soonest available appointment with Dr. Lodema Hong. Her call back number is [phone].

## 2023-07-10 ENCOUNTER — Other Ambulatory Visit: Payer: Self-pay | Admitting: Family Medicine

## 2023-07-11 ENCOUNTER — Other Ambulatory Visit: Payer: Self-pay | Admitting: Family Medicine

## 2023-07-11 NOTE — Telephone Encounter (Signed)
 Copied from CRM [id number]. Topic: Clinical - Medication Refill >> Jul 11, 2023  4:44 PM Fuller Mandril wrote: Most Recent Primary Care Visit:  Provider: Kerri Perches  Department: [facility]  Visit Type: OFFICE VISIT  Date: 05/13/2023  Medication: levothyroxine (SYNTHROID) 100 MCG tablet - was denied refill not appropriate. Patient would like further information if not able to fill. Thank You  Has the patient contacted their pharmacy? Yes (Agent: If no, request that the patient contact the pharmacy for the refill. If patient does not wish to contact the pharmacy document the reason why and proceed with request.) (Agent: If yes, when and what did the pharmacy advise?) requested and denied  Is this the correct pharmacy for this prescription? Yes If no, delete pharmacy and type the correct one.  This is the patient's preferred pharmacy:  [id number] - [place], [place] - [address] [facility] [address] [place] [address] Phone: [phone] Fax: [phone]   Has the prescription been filled recently? N/A  Is the patient out of the medication? N/A  Has the patient been seen for an appointment in the last year OR does the patient have an upcoming appointment? Yes  Can we respond through MyChart? No  Agent: Please be advised that Rx refills may take up to 3 business days. We ask that you follow-up with your pharmacy.

## 2023-07-13 ENCOUNTER — Telehealth: Payer: Self-pay

## 2023-07-13 MED ORDER — LEVOTHYROXINE SODIUM 100 MCG PO TABS: 100.0000 ug | ORAL_TABLET | Freq: Every day | ORAL | 1 refills | Status: DC

## 2023-07-13 NOTE — Telephone Encounter (Signed)
 Lvm to cb

## 2023-07-13 NOTE — Telephone Encounter (Signed)
 Copied from CRM [id number]. Topic: Clinical - Prescription Issue >> Jul 13, 2023  9:05 AM Geroge Baseman wrote: Reason for CRM: phentermine (ADIPEX-P) 37.5 MG tablet patient states it is not working wants to know if she can take a whole pill instead of half

## 2023-07-13 NOTE — Telephone Encounter (Signed)
 Pt informed

## 2023-07-13 NOTE — Telephone Encounter (Signed)
 Copied from CRM [id number]. Topic: Clinical - Medication Refill >> Jul 11, 2023  4:44 PM Fuller Mandril wrote: Most Recent Primary Care Visit:  Provider: Kerri Perches  Department: [facility]  Visit Type: OFFICE VISIT  Date: 05/13/2023  Medication: levothyroxine (SYNTHROID) 100 MCG tablet - was denied refill not appropriate. Patient would like further information if not able to fill. Thank You  Has the patient contacted their pharmacy? Yes (Agent: If no, request that the patient contact the pharmacy for the refill. If patient does not wish to contact the pharmacy document the reason why and proceed with request.) (Agent: If yes, when and what did the pharmacy advise?) requested and denied  Is this the correct pharmacy for this prescription? Yes If no, delete pharmacy and type the correct one.  This is the patient's preferred pharmacy:  [id number] - [place], [place] - [address] [facility] [address] [place] [address] Phone: [phone] Fax: [phone]   Has the prescription been filled recently? N/A  Is the patient out of the medication? N/A  Has the patient been seen for an appointment in the last year OR does the patient have an upcoming appointment? Yes  Can we respond through MyChart? No  Agent: Please be advised that Rx refills may take up to 3 business days. We ask that you follow-up with your pharmacy.

## 2023-07-20 ENCOUNTER — Telehealth: Payer: Self-pay

## 2023-07-20 NOTE — Telephone Encounter (Signed)
 Patient was identified as falling into the [place] Measure - Diabetes.   Patient was: Appointment already scheduled for:  08/11/23.

## 2023-08-03 ENCOUNTER — Other Ambulatory Visit: Payer: Self-pay | Admitting: Family Medicine

## 2023-08-11 ENCOUNTER — Ambulatory Visit: Payer: Medicare HMO | Admitting: Family Medicine

## 2023-08-22 ENCOUNTER — Ambulatory Visit: Payer: Self-pay

## 2023-08-22 NOTE — Telephone Encounter (Addendum)
 Copied From CRM [id number]. Reason for Triage: Patient has swelling in her left leg, said its been going on for a while when she walks too much.   08/22/23 0900- 1st attempt, no answer, unable to leave VM  08/22/23 10:06a- 2nd attempt, no answer, unable to leave VM. "Call cannot be completed as dialed" Pt may have spam blocker. Will go ahead and route to office for f/u

## 2023-08-23 NOTE — Telephone Encounter (Signed)
 Call can not be completed , tried to reach patient to get more information to pass along to provider

## 2023-08-25 DIAGNOSIS — H5213 Myopia, bilateral: Secondary | ICD-10-CM | POA: Diagnosis not present

## 2023-09-09 ENCOUNTER — Encounter: Payer: Self-pay | Admitting: Family Medicine

## 2023-09-09 ENCOUNTER — Ambulatory Visit (INDEPENDENT_AMBULATORY_CARE_PROVIDER_SITE_OTHER): Admitting: Family Medicine

## 2023-09-09 VITALS — BP 150/80 | HR 88 | Resp 18 | Ht 61.0 in | Wt 209.0 lb

## 2023-09-09 DIAGNOSIS — M19072 Primary osteoarthritis, left ankle and foot: Secondary | ICD-10-CM | POA: Diagnosis not present

## 2023-09-09 DIAGNOSIS — I10 Essential (primary) hypertension: Secondary | ICD-10-CM

## 2023-09-09 DIAGNOSIS — E785 Hyperlipidemia, unspecified: Secondary | ICD-10-CM

## 2023-09-09 DIAGNOSIS — E038 Other specified hypothyroidism: Secondary | ICD-10-CM

## 2023-09-09 DIAGNOSIS — K219 Gastro-esophageal reflux disease without esophagitis: Secondary | ICD-10-CM | POA: Diagnosis not present

## 2023-09-09 NOTE — Patient Instructions (Signed)
 Nurse BP check 09/21/2023  MD follow up in 10 weeks  Blood pressure is high, vital that you take spironolactone  one daily AS PRESCRIBED  NO WEIGHT LOSS MEDICATION UNTIL YOUR BLOOD PRESSURE IS CONTROLLED  yOU HAVE ARTHRTIS IN YOUR LEFT ANKLE MAKING IT SWOLLEN  It is important that you exercise regularly at least 30 minutes 5 times a week. If you develop chest pain, have severe difficulty breathing, or feel very tired, stop exercising immediately and seek medical attention  Think about what you will eat, plan ahead. Choose " clean, green, fresh or frozen" over canned, processed or packaged foods which are more sugary, salty and fatty. 70 to 75% of food eaten should be vegetables and fruit. Three meals at set times with snacks allowed between meals, but they must be fruit or vegetables. Aim to eat over a 12 hour period , example 7 am to 7 pm, and STOP after  your last meal of the day. Drink water ,generally about 64 ounces per day, no other drink is as healthy. Fruit juice is best enjoyed in a healthy way, by EATING the fruit.  Thanks for choosing [facility], we consider it a privelige to serve you.

## 2023-09-11 ENCOUNTER — Encounter: Payer: Self-pay | Admitting: Family Medicine

## 2023-09-11 DIAGNOSIS — M19072 Primary osteoarthritis, left ankle and foot: Secondary | ICD-10-CM | POA: Insufficient documentation

## 2023-09-11 NOTE — Assessment & Plan Note (Signed)
 Weight loss,  tylenol , icing and appropriate footwear

## 2023-09-11 NOTE — Assessment & Plan Note (Signed)
 Hyperlipidemia:Low fat diet discussed and encouraged.   Lipid Panel  Lab Results  Component Value Date   CHOL 178 12/17/2021   HDL 58 12/17/2021   LDLCALC 100 (H) 12/17/2021   TRIG 110 12/17/2021   CHOLHDL 3.1 12/17/2021     Needs to reduce fat  in diet Updated lab needed at/ before next visit.

## 2023-09-11 NOTE — Progress Notes (Signed)
 Victoria Reyes     MRN: [id number]      DOB: 10/07/1950  Chief Complaint  Patient presents with   Medical Management of Chronic Issues    12 week follow up     HPI Victoria Reyes is here for follow up and re-evaluation of chronic medical conditions, medication management and review of any available recent lab and radiology data.  Preventive health is updated, specifically  Cancer screening and Immunization.   Questions or concerns regarding consultations or procedures which the PT has had in the interim are  addressed. The PT denies any adverse reactions to current medications since the last visit.  Wants to resume med to help with weight ;loss, BP unfortunately is still too hoigh, nottaking one of the prescribed meds Weight regain off appetite suppressant and with thyroid  appropriately corrected  ROS Denies recent fever or chills. Denies sinus pressure, nasal congestion, ear pain or sore throat. Denies chest congestion, productive cough or wheezing. Denies chest pains, palpitations and leg swelling Denies abdominal pain, nausea, vomiting,diarrhea or constipation.   Denies dysuria, frequency, hesitancy or incontinence. C/O left ankle swelling and  deformity, describes nerve damage in the past to the extremity, no recall of direct ankle trauma. Denies headaches, seizures, numbness, or tingling. Denies depression, anxiety or insomnia. Denies skin break down or rash.   PE  BP (!) 150/80   Pulse 88   Resp 18   Ht 5\' 1"  (1.549 m)   Wt 209 lb (94.8 kg)   SpO2 99%   BMI 39.49 kg/m   Patient alert and oriented and in no cardiopulmonary distress.  HEENT: No facial asymmetry, EOMI,     Neck supple .  Chest: Clear to auscultation bilaterally.  CVS: S1, S2 no murmurs, no S3.Regular rate.  ABD: Soft non tender.   Ext: No edema  MS: Adequate though reduced  ROM spine, shoulders, hips and knees.sWelling and deformity of left ankle  Skin: Intact, no ulcerations or rash  noted.  Psych: Good eye contact, normal affect. Memory intact not anxious or depressed appearing.  CNS: CN 2-12 intact, power,  normal throughout.no focal deficits noted.   Assessment & Plan  Essential hypertension Uncontrolled due to inadvertent non compliance. Sttrat daily spironolactone  as prescribed. Nurse BP check in 2 weeks DASH diet and commitment to daily physical activity for a minimum of 30 minutes discussed and encouraged, as a part of hypertension management. The importance of attaining a healthy weight is also discussed.     09/09/2023   10:15 AM 09/09/2023    9:43 AM 05/15/2023    7:20 AM 05/13/2023   10:10 AM 05/13/2023   10:09 AM 03/29/2023   11:13 AM 03/29/2023   11:04 AM  BP/Weight  Systolic BP 150 152 128 131 144 150 133  Diastolic BP 80 82 72 69 80 80 74  Wt. (Lbs)  209   204    BMI  39.49 kg/m2   38.55 kg/m2         Hypothyroidism Adequately corrected continue same med dose  Morbid obesity (HCC) Deteriorated   Patient re-educated about  the importance of commitment to a  minimum of 150 minutes of exercise per week as able.  The importance of healthy food choices with portion control discussed, as well as eating regularly and within a 12 hour window most days. The need to choose "clean , green" food 50 to 75% of the time is discussed, as well as to make water  the primary drink  and set a goal of 64 ounces water  daily.       09/09/2023    9:43 AM 05/13/2023   10:09 AM 03/29/2023   11:01 AM  Weight /BMI  Weight 209 lb 204 lb 197 lb 1.3 oz  Height 5\' 1"  (1.549 m) 5\' 1"  (1.549 m) 5\' 1"  (1.549 m)  BMI 39.49 kg/m2 38.55 kg/m2 37.24 kg/m2    Will start phentermine  once BP is controled  Hyperlipidemia with target LDL less than 100 Hyperlipidemia:Low fat diet discussed and encouraged.   Lipid Panel  Lab Results  Component Value Date   CHOL 178 12/17/2021   HDL 58 12/17/2021   LDLCALC 100 (H) 12/17/2021   TRIG 110 12/17/2021   CHOLHDL 3.1  12/17/2021     Needs to reduce fat  in diet Updated lab needed at/ before next visit.   GERD (gastroesophageal reflux disease) Controlled, no change in medication   Osteoarthritis of ankle, left Weight loss,  tylenol , icing and appropriate footwear

## 2023-09-11 NOTE — Assessment & Plan Note (Signed)
 Uncontrolled due to inadvertent non compliance. Sttrat daily spironolactone  as prescribed. Nurse BP check in 2 weeks DASH diet and commitment to daily physical activity for a minimum of 30 minutes discussed and encouraged, as a part of hypertension management. The importance of attaining a healthy weight is also discussed.     09/09/2023   10:15 AM 09/09/2023    9:43 AM 05/15/2023    7:20 AM 05/13/2023   10:10 AM 05/13/2023   10:09 AM 03/29/2023   11:13 AM 03/29/2023   11:04 AM  BP/Weight  Systolic BP 150 152 128 131 144 150 133  Diastolic BP 80 82 72 69 80 80 74  Wt. (Lbs)  209   204    BMI  39.49 kg/m2   38.55 kg/m2

## 2023-09-11 NOTE — Assessment & Plan Note (Signed)
 Deteriorated   Patient re-educated about  the importance of commitment to a  minimum of 150 minutes of exercise per week as able.  The importance of healthy food choices with portion control discussed, as well as eating regularly and within a 12 hour window most days. The need to choose "clean , green" food 50 to 75% of the time is discussed, as well as to make water  the primary drink and set a goal of 64 ounces water  daily.       09/09/2023    9:43 AM 05/13/2023   10:09 AM 03/29/2023   11:01 AM  Weight /BMI  Weight 209 lb 204 lb 197 lb 1.3 oz  Height 5\' 1"  (1.549 m) 5\' 1"  (1.549 m) 5\' 1"  (1.549 m)  BMI 39.49 kg/m2 38.55 kg/m2 37.24 kg/m2    Will start phentermine  once BP is controled

## 2023-09-11 NOTE — Assessment & Plan Note (Signed)
 Controlled, no change in medication

## 2023-09-11 NOTE — Assessment & Plan Note (Signed)
 Adequately corrected continue same med dose

## 2023-09-21 ENCOUNTER — Ambulatory Visit

## 2023-09-21 ENCOUNTER — Telehealth: Payer: Self-pay

## 2023-09-21 VITALS — BP 168/84

## 2023-09-21 DIAGNOSIS — I10 Essential (primary) hypertension: Secondary | ICD-10-CM

## 2023-09-21 NOTE — Telephone Encounter (Signed)
 Copied from CRM [id number]. Topic: General - Other >> Sep 21, 2023  2:14 PM Zipporah Him wrote: Reason for CRM: Patient wanted to check with office because she received a mychart message about her visit earlier but cannot open it. Wanted to know if the nurse needed to speak with her. She states she has been waiting on a call from the office, she states she was told they would call this evening  and she did not want to miss it.

## 2023-09-21 NOTE — Progress Notes (Signed)
 Patient is in office today for a nurse visit for Blood Pressure Check. Patient blood pressure was 160/83, Patient No chest pain, No shortness of breath, No dyspnea on exertion, No orthopnea, No paroxysmal nocturnal dyspnea, No edema, No palpitations, No syncope. Had patient sit and wait, rechecked blood pressure second reading is 168/84.

## 2023-09-22 NOTE — Telephone Encounter (Signed)
 From what I can see no one has tried to contact pt. Will reach out if otherwise.

## 2023-09-23 MED ORDER — SPIRONOLACTONE 50 MG PO TABS
50.0000 mg | ORAL_TABLET | Freq: Every day | ORAL | 3 refills | Status: DC
Start: 2023-09-23 — End: 2023-11-18

## 2023-09-23 NOTE — Addendum Note (Signed)
 Addended by: Alberteen Huge E on: 09/23/2023 01:16 PM   Modules accepted: Orders

## 2023-09-27 ENCOUNTER — Ambulatory Visit: Admitting: Family Medicine

## 2023-11-18 ENCOUNTER — Ambulatory Visit (INDEPENDENT_AMBULATORY_CARE_PROVIDER_SITE_OTHER): Admitting: Family Medicine

## 2023-11-18 ENCOUNTER — Encounter: Payer: Self-pay | Admitting: Family Medicine

## 2023-11-18 VITALS — BP 123/65 | HR 92 | Resp 18 | Ht 61.5 in | Wt 215.1 lb

## 2023-11-18 DIAGNOSIS — R7989 Other specified abnormal findings of blood chemistry: Secondary | ICD-10-CM | POA: Diagnosis not present

## 2023-11-18 DIAGNOSIS — E038 Other specified hypothyroidism: Secondary | ICD-10-CM | POA: Diagnosis not present

## 2023-11-18 DIAGNOSIS — I10 Essential (primary) hypertension: Secondary | ICD-10-CM

## 2023-11-18 DIAGNOSIS — E785 Hyperlipidemia, unspecified: Secondary | ICD-10-CM | POA: Diagnosis not present

## 2023-11-18 DIAGNOSIS — D649 Anemia, unspecified: Secondary | ICD-10-CM

## 2023-11-18 MED ORDER — PHENTERMINE HCL 37.5 MG PO TABS: ORAL_TABLET | ORAL | 1 refills | Status: AC

## 2023-11-18 MED ORDER — SPIRONOLACTONE 50 MG PO TABS: 50.0000 mg | ORAL_TABLET | Freq: Every day | ORAL | 3 refills | Status: AC

## 2023-11-18 NOTE — Patient Instructions (Addendum)
 F/U in 12/19 or after  Fasting lipid, cbc, cmp and EGFr, TSH , free T3 and free T4 and vit D 3 to 5 days before next appt  New is half phentermine  daily  It is important that you exercise regularly at least 30 minutes 5 times a week. If you develop chest pain, have severe difficulty breathing, or feel very tired, stop exercising immediately and seek medical attention    Thanks for choosing [facility], we consider it a privelige to serve you.

## 2023-11-20 ENCOUNTER — Encounter: Payer: Self-pay | Admitting: Family Medicine

## 2023-11-20 NOTE — Assessment & Plan Note (Signed)
 Controlled, no change in medication

## 2023-11-20 NOTE — Assessment & Plan Note (Signed)
 Hyperlipidemia:Low fat diet discussed and encouraged.   Lipid Panel  Lab Results  Component Value Date   CHOL 178 12/17/2021   HDL 58 12/17/2021   LDLCALC 100 (H) 12/17/2021   TRIG 110 12/17/2021   CHOLHDL 3.1 12/17/2021     Updated lab needed at/ before next visit.

## 2023-11-20 NOTE — Assessment & Plan Note (Signed)
  Patient re-educated about  the importance of commitment to a  minimum of 150 minutes of exercise per week as able.  The importance of healthy food choices with portion control discussed, as well as eating regularly and within a 12 hour window most days. The need to choose clean , green food 50 to 75% of the time is discussed, as well as to make water  the primary drink and set a goal of 64 ounces water  daily.       11/18/2023    9:52 AM 09/09/2023    9:43 AM 05/13/2023   10:09 AM  Weight /BMI  Weight 215 lb 1.3 oz 209 lb 204 lb  Height 5' 1.5 (1.562 m) 5' 1 (1.549 m) 5' 1 (1.549 m)  BMI 39.98 kg/m2 39.49 kg/m2 38.55 kg/m2    Start half phentermine  daily

## 2023-11-20 NOTE — Assessment & Plan Note (Signed)
 Controlled, no change in medication DASH diet and commitment to daily physical activity for a minimum of 30 minutes discussed and encouraged, as a part of hypertension management. The importance of attaining a healthy weight is also discussed.     11/18/2023    9:52 AM 09/21/2023    9:57 AM 09/21/2023    9:52 AM 09/09/2023   10:15 AM 09/09/2023    9:43 AM 05/15/2023    7:20 AM 05/13/2023   10:10 AM  BP/Weight  Systolic BP 123 168 160 150 152 128 131  Diastolic BP 65 84 83 80 82 72 69  Wt. (Lbs) 215.08    209    BMI 39.98 kg/m2    39.49 kg/m2      '

## 2023-11-20 NOTE — Progress Notes (Signed)
 Victoria Reyes     MRN: [id number]      DOB: Jan 20, 1951  Chief Complaint  Patient presents with   Hypertension    10 week follow up     HPI Victoria Reyes is here for follow up and re-evaluation of chronic medical conditions, medication management and review of any available recent lab and radiology data.  Preventive health is updated, specifically  Cancer screening and Immunization.   Questions or concerns regarding consultations or procedures which the PT has had in the interim are  addressed. The PT denies any adverse reactions to current medications since the last visit.  Wants to resume phentermine  for weight loss , has changed diet and is compliant with medication  ROS Denies recent fever or chills. Denies sinus pressure, nasal congestion, ear pain or sore throat. Denies chest congestion, productive cough or wheezing. Denies chest pains, palpitations and leg swelling Denies abdominal pain, nausea, vomiting,diarrhea or constipation.   Denies dysuria, frequency, hesitancy or incontinence. Denies joint pain, swelling and limitation in mobility. Denies headaches, seizures, numbness, or tingling. Denies depression, anxiety or insomnia. Denies skin break down or rash.   PE  BP 123/65   Pulse 92   Resp 18   Ht 5' 1.5 (1.562 m)   Wt 215 lb 1.3 oz (97.6 kg)   SpO2 97%   BMI 39.98 kg/m   Patient alert and oriented and in no cardiopulmonary distress.  HEENT: No facial asymmetry, EOMI,     Neck supple .  Chest: Clear to auscultation bilaterally.  CVS: S1, S2 no murmurs, no S3.Regular rate.  ABD: Soft non tender.   Ext: No edema  MS: Adequate though reduced  ROM spine, shoulders, hips and knees.  Skin: Intact, no ulcerations or rash noted.  Psych: Good eye contact, normal affect. Memory intact not anxious or depressed appearing.  CNS: CN 2-12 intact, power,  normal throughout.no focal deficits noted.   Assessment & Plan  Morbid obesity [facility])  Patient  re-educated about  the importance of commitment to a  minimum of 150 minutes of exercise per week as able.  The importance of healthy food choices with portion control discussed, as well as eating regularly and within a 12 hour window most days. The need to choose clean , green food 50 to 75% of the time is discussed, as well as to make water  the primary drink and set a goal of 64 ounces water  daily.       11/18/2023    9:52 AM 09/09/2023    9:43 AM 05/13/2023   10:09 AM  Weight /BMI  Weight 215 lb 1.3 oz 209 lb 204 lb  Height 5' 1.5 (1.562 m) 5' 1 (1.549 m) 5' 1 (1.549 m)  BMI 39.98 kg/m2 39.49 kg/m2 38.55 kg/m2    Start half phentermine  daily  Hypothyroidism Controlled, no change in medication   Essential hypertension Controlled, no change in medication DASH diet and commitment to daily physical activity for a minimum of 30 minutes discussed and encouraged, as a part of hypertension management. The importance of attaining a healthy weight is also discussed.     11/18/2023    9:52 AM 09/21/2023    9:57 AM 09/21/2023    9:52 AM 09/09/2023   10:15 AM 09/09/2023    9:43 AM 05/15/2023    7:20 AM 05/13/2023   10:10 AM  BP/Weight  Systolic BP 123 168 160 150 152 128 131  Diastolic BP 65 84 83 80 82 72 69  Wt. (Lbs) 215.08    209    BMI 39.98 kg/m2    39.49 kg/m2      '  Low vitamin D  level Updated lab needed at/ before next visit.   Hyperlipidemia with target LDL less than 100 Hyperlipidemia:Low fat diet discussed and encouraged.   Lipid Panel  Lab Results  Component Value Date   CHOL 178 12/17/2021   HDL 58 12/17/2021   LDLCALC 100 (H) 12/17/2021   TRIG 110 12/17/2021   CHOLHDL 3.1 12/17/2021     Updated lab needed at/ before next visit.

## 2023-11-20 NOTE — Assessment & Plan Note (Signed)
 Updated lab needed at/ before next visit.

## 2024-01-06 ENCOUNTER — Other Ambulatory Visit: Payer: Self-pay | Admitting: Family Medicine

## 2024-02-13 ENCOUNTER — Other Ambulatory Visit (HOSPITAL_COMMUNITY): Payer: Self-pay | Admitting: Family Medicine

## 2024-02-13 ENCOUNTER — Encounter: Payer: Self-pay | Admitting: Radiology

## 2024-02-13 DIAGNOSIS — Z1231 Encounter for screening mammogram for malignant neoplasm of breast: Secondary | ICD-10-CM

## 2024-03-06 ENCOUNTER — Ambulatory Visit (INDEPENDENT_AMBULATORY_CARE_PROVIDER_SITE_OTHER)

## 2024-03-06 VITALS — Ht 61.5 in | Wt 210.0 lb

## 2024-03-06 DIAGNOSIS — Z0000 Encounter for general adult medical examination without abnormal findings: Secondary | ICD-10-CM

## 2024-03-06 DIAGNOSIS — Z780 Asymptomatic menopausal state: Secondary | ICD-10-CM | POA: Diagnosis not present

## 2024-03-06 NOTE — Patient Instructions (Signed)
 Victoria Reyes,  Thank you for taking the time for your Medicare Wellness Visit. I appreciate your continued commitment to your health goals. Please review the care plan we discussed, and feel free to reach out if I can assist you further.  Please note that Annual Wellness Visits do not include a physical exam. Some assessments may be limited, especially if the visit was conducted virtually. If needed, we may recommend an in-person follow-up with your provider.  Ongoing Care Seeing your primary care provider every 3 to 6 months helps us  monitor your health and provide consistent, personalized care.   1 yr follow up with Medicare Wellness Nurse: March 11, 2025 at 8:00 am in person   Referrals If a referral was made during today's visit and you haven't received any updates within two weeks, please contact the referred provider directly to check on the status.  Osteoporosis Screening An order was placed for you to have your Osteoporosis Screening. Call the number below to schedule that [facility]  [phone]   Recommended Screenings:  Health Maintenance  Topic Date Due   Pneumococcal Vaccine for age over 67 (1 of 1 - PCV) Never done   Zoster (Shingles) Vaccine (1 of 2) Never done   Osteoporosis screening with Bone Density Scan  12/29/2019   Flu Shot  Never done   COVID-19 Vaccine (1 - 2025-26 season) Never done   Breast Cancer Screening  03/13/2024   Medicare Annual Wellness Visit  03/06/2025   Colon Cancer Screening  05/13/2030   Hepatitis C Screening  Completed   Meningitis B Vaccine  Aged Out   DTaP/Tdap/Td vaccine  Discontinued       03/06/2024    8:47 AM  Advanced Directives  Does Patient Have a Medical Advance Directive? No  Would patient like information on creating a medical advance directive? No - Patient declined    Vision: Annual vision screenings are recommended for early detection of glaucoma, cataracts, and diabetic retinopathy. These exams can also  reveal signs of chronic conditions such as diabetes and high blood pressure.  Dental: Annual dental screenings help detect early signs of oral cancer, gum disease, and other conditions linked to overall health, including heart disease and diabetes.  Please see the attached documents for additional preventive care recommendations.

## 2024-03-06 NOTE — Progress Notes (Signed)
 Chief Complaint  Patient presents with   Medicare Wellness     Subjective:   Victoria Reyes is a 73 y.o. female who presents for a Medicare Annual Wellness Visit.  Allergies (verified) Nsaids, Hydrocodone, and Sulfa  antibiotics   History: Past Medical History:  Diagnosis Date   Anemia    as a  young woman   Diverticulitis 2005   CT    DJD (degenerative joint disease)    GERD (gastroesophageal reflux disease)    H. pylori infection 01/2020   S/p treatment with omeprazole , amoxicillin , and Biaxin .   Headache(784.0)    Hyperlipidemia    Hypothyroidism    Knee pain, right    Low back pain    Neuropathy    peroneal nerve   Neuropathy    left lower leg from back surgery per patient   Obesity    Pneumonia    as child   S/P colonoscopy 2005   Dr. Claudene: sigmoid diverticulosis   Thyroid  disease    Tobacco abuse    Past Surgical History:  Procedure Laterality Date   BACK SURGERY     BIOPSY  01/29/2020   Procedure: BIOPSY;  Surgeon: Eartha Angelia Sieving, MD;  Location: [facility];  Service: Gastroenterology;;   BIOPSY  05/13/2020   Procedure: BIOPSY;  Surgeon: Cindie Carlin POUR, DO;  Location: [facility] ENDOSCOPY;  Service: Endoscopy;;   COLONOSCOPY  12/18/2010   Procedure: COLONOSCOPY;  Surgeon: Margo CHRISTELLA Haddock, MD; 3 mm sessile polyp at hepatic flexure, pancolonic diverticulosis, internal hemorrhoids, anal lesion just above the dentate line biopsied via cold forceps.  Colonic polyp was a tubular adenoma.  Anus biopsy with mild active inflammation associated with reactive epithelial changes and hemorrhage.  Recommended repeat colonoscopy in 2022.   COLONOSCOPY WITH PROPOFOL  N/A 05/13/2020   Procedure: COLONOSCOPY WITH PROPOFOL ;  Surgeon: Cindie Carlin POUR, DO;  Location: [facility] ENDOSCOPY;  Service: Endoscopy;  Laterality: N/A;  12:30PM   cyst removal right wrist     ESOPHAGOGASTRODUODENOSCOPY (EGD) WITH PROPOFOL  N/A 01/29/2020   Procedure: ESOPHAGOGASTRODUODENOSCOPY (EGD) WITH  PROPOFOL ;  Surgeon: Eartha Angelia, Sieving, MD;   3 gastric ulcers with clean base, 5 ulcers in the duodenal bulb and first portion of duodenum, 2 were noted to be oozing, treated with bipolar cautery.  Gastric biopsies consistent with H. pylori gastritis.   ESOPHAGOGASTRODUODENOSCOPY (EGD) WITH PROPOFOL  N/A 05/13/2020   Procedure: ESOPHAGOGASTRODUODENOSCOPY (EGD) WITH PROPOFOL ;  Surgeon: Cindie Carlin POUR, DO;  Location: [facility] ENDOSCOPY;  Service: Endoscopy;  Laterality: N/A;   EYE SURGERY Bilateral    cataract surgery with lens implant   POLYPECTOMY  05/13/2020   Procedure: POLYPECTOMY;  Surgeon: Cindie Carlin POUR, DO;  Location: [facility] ENDOSCOPY;  Service: Endoscopy;;   TONSILLECTOMY     TOTAL KNEE ARTHROPLASTY Right 12/28/2022   Procedure: TOTAL KNEE ARTHROPLASTY;  Surgeon: Addie Cordella Hamilton, MD;  Location: [facility] OR;  Service: Orthopedics;  Laterality: Right;   TOTAL SHOULDER ARTHROPLASTY Left 12/21/2016   Procedure: TOTAL SHOULDER ARTHROPLASTY;  Surgeon: Addie Cordella Hamilton, MD;  Location: [facility] OR;  Service: Orthopedics;  Laterality: Left;   TUBAL LIGATION     umbillical hernia     Family History  Problem Relation Age of Onset   Heart attack Sister        pacemaker   Kidney failure Sister        on dialysis   Colon cancer Neg Hx    Stomach cancer Neg Hx    Esophageal cancer Neg Hx  Social History   Occupational History   Not on file  Tobacco Use   Smoking status: Former    Current packs/day: 0.00    Average packs/day: 0.3 packs/day for 51.9 years (13.0 ttl pk-yrs)    Types: Cigarettes    Start date: 09/30/1963    Quit date: 08/15/2015    Years since quitting: 8.5   Smokeless tobacco: Never  Vaping Use   Vaping status: Never Used  Substance and Sexual Activity   Alcohol use: No    Alcohol/week: 0.0 standard drinks of alcohol   Drug use: No   Sexual activity: Not Currently    Birth control/protection: Post-menopausal   Tobacco Counseling Counseling given: Yes  SDOH Screenings    Food Insecurity: No Food Insecurity (03/06/2024)  Housing: Low Risk  (03/06/2024)  Transportation Needs: No Transportation Needs (03/06/2024)  Utilities: Not At Risk (03/06/2024)  Alcohol Screen: Low Risk  (01/17/2020)  Depression (PHQ2-9): Low Risk  (03/06/2024)  Financial Resource Strain: Low Risk  (03/11/2021)  Physical Activity: Inactive (03/06/2024)  Social Connections: Moderately Isolated (03/06/2024)  Stress: No Stress Concern Present (03/06/2024)  Tobacco Use: Medium Risk (03/06/2024)  Health Literacy: Adequate Health Literacy (03/06/2024)   See flowsheets for full screening details  Depression Screen PHQ 2 & 9 Depression Scale- Over the past 2 weeks, how often have you been bothered by any of the following problems? Little interest or pleasure in doing things: 0 Feeling down, depressed, or hopeless (PHQ Adolescent also includes...irritable): 0 PHQ-2 Total Score: 0     Goals Addressed             This Visit's Progress    I want to lose weight         Visit info / Clinical Intake: Medicare Wellness Visit Type:: Subsequent Annual Wellness Visit Persons participating in visit:: patient Medicare Wellness Visit Mode:: Telephone If telephone:: video error Because this visit was a virtual/telehealth visit:: pt reported vitals If Telephone or Video please confirm:: I connected with the patient using audio enabled telemedicine application and verified that I am speaking with the correct person using two identifiers; I discussed the limitations of evaluation and management by telemedicine; The patient expressed understanding and agreed to proceed Patient Location:: house Provider Location:: office Information given by:: patient Interpreter Needed?: No Pre-visit prep was completed: yes AWV questionnaire completed by patient prior to visit?: no Living arrangements:: with family/others Patient's Overall Health Status Rating: good Typical amount of pain: some Does pain  affect daily life?: no Are you currently prescribed opioids?: no  Dietary Habits and Nutritional Risks How many meals a day?: 2 Eats fruit and vegetables daily?: yes Most meals are obtained by: preparing own meals In the last 2 weeks, have you had any of the following?: none Diabetic:: no  Functional Status Activities of Daily Living (to include ambulation/medication): Independent Ambulation: Independent Medication Administration: Independent Home Management: Independent Manage your own finances?: yes Primary transportation is: driving Concerns about vision?: no *vision screening is required for WTM* Concerns about hearing?: no  Fall Screening Falls in the past year?: 0 Number of falls in past year: 0 Was there an injury with Fall?: 0 Fall Risk Category Calculator: 0 Patient Fall Risk Level: Low Fall Risk  Fall Risk Patient at Risk for Falls Due to: No Fall Risks Fall risk Follow up: Falls evaluation completed; Education provided; Falls prevention discussed  Home and Transportation Safety: All rugs have non-skid backing?: yes All stairs or steps have railings?: yes Grab bars in the  bathtub or shower?: (!) no Have non-skid surface in bathtub or shower?: (!) no Good home lighting?: yes Regular seat belt use?: yes Hospital stays in the last year:: (!) no; yes Reason: RT TKA  Cognitive Assessment Difficulty concentrating, remembering, or making decisions? : no Will 6CIT or Mini Cog be Completed: no 6CIT or Mini Cog Declined: patient alert, oriented, able to answer questions appropriately and recall recent events  Advance Directives (For Healthcare) Does Patient Have a Medical Advance Directive?: No Would patient like information on creating a medical advance directive?: No - Patient declined  Reviewed/Updated  Reviewed/Updated: Reviewed All (Medical, Surgical, Family, Medications, Allergies, Care Teams, Patient Goals)        Objective:    Today's Vitals    03/06/24 0840  Weight: 210 lb (95.3 kg)  Height: 5' 1.5 (1.562 m)   Body mass index is 39.04 kg/m.  Current Medications (verified) Outpatient Encounter Medications as of 03/06/2024  Medication Sig   levothyroxine  (SYNTHROID ) 100 MCG tablet TAKE 1 TABLET BY MOUTH EVERY DAY   omeprazole  (PRILOSEC) 40 MG capsule TAKE 1 CAPSULE (40 MG TOTAL) BY MOUTH DAILY.   phentermine  (ADIPEX-P ) 37.5 MG tablet Take half tablet by mouth once daily   phentermine  (ADIPEX-P ) 37.5 MG tablet Take half tablet by mouth once daily   spironolactone  (ALDACTONE ) 50 MG tablet Take 1 tablet (50 mg total) by mouth daily.   aspirin  81 MG chewable tablet Chew 1 tablet (81 mg total) by mouth 2 (two) times daily. To prevent blood clots. Take with food   No facility-administered encounter medications on file as of 03/06/2024.   Hearing/Vision screen Hearing Screening - Comments:: Patient denies any hearing difficulties.   Vision Screening - Comments:: Wears rx glasses - up to date with routine eye exams with  [facility] office  Immunizations and Health Maintenance Health Maintenance  Topic Date Due   Pneumococcal Vaccine: 50+ Years (1 of 1 - PCV) Never done   Zoster Vaccines- Shingrix (1 of 2) Never done   Bone Density Scan  12/29/2019   Medicare Annual Wellness (AWV)  03/16/2023   Influenza Vaccine  Never done   COVID-19 Vaccine (1 - 2025-26 season) Never done   Mammogram  03/13/2024   Colonoscopy  05/13/2030   Hepatitis C Screening  Completed   Meningococcal B Vaccine  Aged Out   DTaP/Tdap/Td  Discontinued        Assessment/Plan:  This is a routine wellness examination for Victoria Reyes.  Patient Care Team: Antonetta Rollene BRAVO, MD as PCP - General (Family Medicine) Cindie Carlin POUR, DO as Consulting Physician (Gastroenterology)  I have personally reviewed and noted the following in the patient's chart:   Medical and social history Use of alcohol, tobacco or illicit drugs  Current medications  and supplements including opioid prescriptions. Functional ability and status Nutritional status Physical activity Advanced directives List of other physicians Hospitalizations, surgeries, and ER visits in previous 12 months Vitals Screenings to include cognitive, depression, and falls Referrals and appointments  Orders Placed This Encounter  Procedures   DG Bone Density    Standing Status:   Future    Expiration Date:   03/06/2025    Reason for Exam (SYMPTOM  OR DIAGNOSIS REQUIRED):   post menopausal estrogen deficient    Preferred imaging location?:   [facility]   In addition, I have reviewed and discussed with patient certain preventive protocols, quality metrics, and best practice recommendations. A written personalized care plan for preventive services as  well as general preventive health recommendations were provided to patient.   Renny Remer, CMA   03/06/2024   No follow-ups on file.  After Visit Summary: (MyChart) Due to this being a telephonic visit, the after visit summary with patients personalized plan was offered to patient via MyChart   Nurse Notes: bone density ordered

## 2024-03-16 ENCOUNTER — Inpatient Hospital Stay (HOSPITAL_COMMUNITY): Admission: RE | Admit: 2024-03-16 | Discharge: 2024-03-16 | Attending: Internal Medicine

## 2024-03-16 ENCOUNTER — Ambulatory Visit (HOSPITAL_COMMUNITY)
Admission: RE | Admit: 2024-03-16 | Discharge: 2024-03-16 | Disposition: A | Source: Ambulatory Visit | Attending: Family Medicine | Admitting: Family Medicine

## 2024-03-16 DIAGNOSIS — Z1231 Encounter for screening mammogram for malignant neoplasm of breast: Secondary | ICD-10-CM | POA: Diagnosis not present

## 2024-03-16 DIAGNOSIS — Z780 Asymptomatic menopausal state: Secondary | ICD-10-CM

## 2024-03-17 ENCOUNTER — Ambulatory Visit: Payer: Self-pay | Admitting: Internal Medicine

## 2024-03-20 ENCOUNTER — Telehealth: Payer: Self-pay

## 2024-03-20 NOTE — Telephone Encounter (Signed)
 Copied from CRM [id number]. Topic: Clinical - Lab/Test Results >> Mar 20, 2024  4:09 PM Victoria Reyes wrote: Reason for CRM: The patient is returning a missed call from [place] regarding the DG Bone Density results. CAL informed me  that the  nurse will reach out to the patient.

## 2024-03-21 NOTE — Telephone Encounter (Signed)
 Message sent to patient

## 2024-03-29 ENCOUNTER — Ambulatory Visit: Payer: Self-pay | Admitting: Family Medicine

## 2024-03-29 LAB — CBC WITH DIFFERENTIAL/PLATELET
Basophils Absolute: 0.1 x10E3/uL (ref 0.0–0.2)
Basos: 1 %
EOS (ABSOLUTE): 0.2 x10E3/uL (ref 0.0–0.4)
Eos: 3 %
Hematocrit: 41.5 % (ref 34.0–46.6)
Hemoglobin: 13.3 g/dL (ref 11.1–15.9)
Immature Grans (Abs): 0 x10E3/uL (ref 0.0–0.1)
Immature Granulocytes: 0 %
Lymphocytes Absolute: 3.2 x10E3/uL — ABNORMAL HIGH (ref 0.7–3.1)
Lymphs: 37 %
MCH: 28.2 pg (ref 26.6–33.0)
MCHC: 32 g/dL (ref 31.5–35.7)
MCV: 88 fL (ref 79–97)
Monocytes Absolute: 0.6 x10E3/uL (ref 0.1–0.9)
Monocytes: 6 %
Neutrophils Absolute: 4.6 x10E3/uL (ref 1.4–7.0)
Neutrophils: 53 %
Platelets: 319 x10E3/uL (ref 150–450)
RBC: 4.72 x10E6/uL (ref 3.77–5.28)
RDW: 13 % (ref 11.7–15.4)
WBC: 8.7 x10E3/uL (ref 3.4–10.8)

## 2024-03-29 LAB — CMP14+EGFR
ALT: 5 IU/L (ref 0–32)
AST: 14 IU/L (ref 0–40)
Albumin: 4.2 g/dL (ref 3.8–4.8)
Alkaline Phosphatase: 136 IU/L — ABNORMAL HIGH (ref 49–135)
BUN/Creatinine Ratio: 17 (ref 12–28)
BUN: 19 mg/dL (ref 8–27)
Bilirubin Total: 0.5 mg/dL (ref 0.0–1.2)
CO2: 20 mmol/L (ref 20–29)
Calcium: 9.2 mg/dL (ref 8.7–10.3)
Chloride: 103 mmol/L (ref 96–106)
Creatinine, Ser: 1.14 mg/dL — ABNORMAL HIGH (ref 0.57–1.00)
Globulin, Total: 2.7 g/dL (ref 1.5–4.5)
Glucose: 94 mg/dL (ref 70–99)
Potassium: 4.5 mmol/L (ref 3.5–5.2)
Sodium: 138 mmol/L (ref 134–144)
Total Protein: 6.9 g/dL (ref 6.0–8.5)
eGFR: 51 mL/min/1.73 — ABNORMAL LOW (ref >59)

## 2024-03-29 LAB — VITAMIN D 25 HYDROXY (VIT D DEFICIENCY, FRACTURES): Vit D, 25-Hydroxy: 30.1 ng/mL (ref 30.0–100.0)

## 2024-03-29 LAB — T3, FREE: T3, Free: 2.3 pg/mL (ref 2.0–4.4)

## 2024-03-29 LAB — TSH+FREE T4
Free T4: 1.33 ng/dL (ref 0.82–1.77)
TSH: 7.12 u[IU]/mL — ABNORMAL HIGH (ref 0.450–4.500)

## 2024-03-29 LAB — LIPID PANEL
Chol/HDL Ratio: 3.5 ratio (ref 0.0–4.4)
Cholesterol, Total: 205 mg/dL — ABNORMAL HIGH (ref 100–199)
HDL: 59 mg/dL (ref >39)
LDL Chol Calc (NIH): 126 mg/dL — ABNORMAL HIGH (ref 0–99)
Triglycerides: 112 mg/dL (ref 0–149)
VLDL Cholesterol Cal: 20 mg/dL (ref 5–40)

## 2024-03-30 ENCOUNTER — Ambulatory Visit: Admitting: Family Medicine

## 2024-03-30 ENCOUNTER — Encounter: Payer: Self-pay | Admitting: Family Medicine

## 2024-03-30 VITALS — BP 113/66 | HR 95 | Resp 16 | Ht 61.5 in | Wt 209.1 lb

## 2024-03-30 DIAGNOSIS — Z2821 Immunization not carried out because of patient refusal: Secondary | ICD-10-CM | POA: Diagnosis not present

## 2024-03-30 DIAGNOSIS — Z0001 Encounter for general adult medical examination with abnormal findings: Secondary | ICD-10-CM

## 2024-03-30 DIAGNOSIS — E038 Other specified hypothyroidism: Secondary | ICD-10-CM | POA: Diagnosis not present

## 2024-03-30 DIAGNOSIS — I10 Essential (primary) hypertension: Secondary | ICD-10-CM

## 2024-03-30 MED ORDER — PHENTERMINE HCL 37.5 MG PO TABS: ORAL_TABLET | ORAL | 1 refills | Status: AC

## 2024-03-30 MED ORDER — OYSTER SHELL CALCIUM/D3 500-5 MG-MCG PO TABS: 1.0000 | ORAL_TABLET | Freq: Two times a day (BID) | ORAL | 3 refills | Status: AC

## 2024-03-30 NOTE — Patient Instructions (Addendum)
 F/U in 4 months   Annual exam with Dr Tobie in 1 year   Take medication the correct way for effectiveness, especially the thyroid  medication please  Eat mainly vegetable and fruit, beans , fish and ensure 64 ounces water  daily  TsH and Free t4 and bmp and EGFR 1 week before next appointment  Twice daily oscal D is sent to [facility], you need this to help to strengthen your bones  It is important that you exercise regularly at least 30 minutes 5 times a week. If you develop chest pain, have severe difficulty breathing, or feel very tired, stop exercising immediately and seek medical attention   Thanks for choosing [facility], we consider it a privelige to serve you.

## 2024-03-30 NOTE — Progress Notes (Unsigned)
" ° ° ° ° °  Victoria Reyes     MRN: [id number]      DOB: [birth year]  Chief Complaint  Patient presents with   Annual Exam    HPI: Patient is in for annual physical exam. Recent labs,  are reviewed. Immunization is reviewed , and  updated if needed.   PE: BP 113/66   Pulse 95   Resp 16   Ht 5' 1.5 (1.562 m)   Wt 209 lb 1.9 oz (94.9 kg)   SpO2 98%   BMI 38.87 kg/m   Pleasant  female, alert and oriented x 3, in no cardio-pulmonary distress. Afebrile. HEENT No facial trauma or asymetry. Sinuses non tender.  Extra occullar muscles intact.. External ears normal, . Neck: supple, no adenopathy,JVD or thyromegaly.No bruits.  Chest: Clear to ascultation bilaterally.No crackles or wheezes. Non tender to palpation   Cardiovascular system; Heart sounds normal,  S1 and  S2 ,no S3.  No murmur, or thrill. Apical beat not displaced Peripheral pulses normal.  Abdomen: Soft, non tender, no organomegaly or masses. No bruits. Bowel sounds normal. No guarding, tenderness or rebound.    Musculoskeletal exam: Decreased  ROM of spine, adequate in hips , shoulders and knees. No deformity ,swelling or crepitus noted. No muscle wasting or atrophy.   Neurologic: Cranial nerves 2 to 12 intact. Power, tone ,sensation  normal throughout. No disturbance in gait. No tremor.  Skin: Intact, no ulceration, erythema , scaling or rash noted. Pigmentation normal throughout  Psych; Normal mood and affect. Judgement and concentration normal   Assessment & Plan:  Encounter for Medicare annual examination with abnormal findings Annual exam as documented. Counseling done  re healthy lifestyle involving commitment to 150 minutes exercise per week, heart healthy diet, and attaining healthy weight.The importance of adequate sleep also discussed. Regular seat belt use and home safety, is also discussed. Changes in health habits are decided on by the patient with goals and time frames  set for  achieving them. Immunization and cancer screening needs are specifically addressed at this visit.   Hypothyroidism Pt not taking medication as directed, taking with food and with other medications, states she  was never aware of current way to take medication  will change how taking , I also encourage her to review with the pharmacist also Updated lab needed at/ before next visit. No med change  Immunization refused Refuses all vaccines, re educated re benefits and safety  Morbid obesity (HCC)  Patient re-educated about  the importance of commitment to a  minimum of 150 minutes of exercise per week as able.  The importance of healthy food choices with portion control discussed, as well as eating regularly and within a 12 hour window most days. The need to choose clean , green food 50 to 75% of the time is discussed, as well as to make water  the primary drink and set a goal of 64 ounces water  daily.       03/30/2024    9:25 AM 03/06/2024    8:40 AM 11/18/2023    9:52 AM  Weight /BMI  Weight 209 lb 1.9 oz 210 lb 215 lb 1.3 oz  Height 5' 1.5 (1.562 m) 5' 1.5 (1.562 m) 5' 1.5 (1.562 m)  BMI 38.87 kg/m2 39.04 kg/m2 39.98 kg/m2    Phentermine  half tab daily prescribed re eval in 4 months  "

## 2024-03-30 NOTE — Assessment & Plan Note (Signed)

## 2024-04-01 NOTE — Assessment & Plan Note (Signed)
 Pt not taking medication as directed, taking with food and with other medications, states she  was never aware of current way to take medication  will change how taking , I also encourage her to review with the pharmacist also Updated lab needed at/ before next visit. No med change

## 2024-04-01 NOTE — Assessment & Plan Note (Signed)
 Refuses all vaccines, re educated re benefits and safety

## 2024-04-01 NOTE — Assessment & Plan Note (Signed)
" °  Patient re-educated about  the importance of commitment to a  minimum of 150 minutes of exercise per week as able.  The importance of healthy food choices with portion control discussed, as well as eating regularly and within a 12 hour window most days. The need to choose clean , green food 50 to 75% of the time is discussed, as well as to make water  the primary drink and set a goal of 64 ounces water  daily.       03/30/2024    9:25 AM 03/06/2024    8:40 AM 11/18/2023    9:52 AM  Weight /BMI  Weight 209 lb 1.9 oz 210 lb 215 lb 1.3 oz  Height 5' 1.5 (1.562 m) 5' 1.5 (1.562 m) 5' 1.5 (1.562 m)  BMI 38.87 kg/m2 39.04 kg/m2 39.98 kg/m2    Phentermine  half tab daily prescribed re eval in 4 months "

## 2024-04-16 ENCOUNTER — Telehealth: Payer: Self-pay

## 2024-04-16 NOTE — Telephone Encounter (Signed)
 Copied from CRM [id number]. Topic: Clinical - Medical Advice >> Apr 16, 2024 11:53 AM Frederich PARAS wrote: Reason for CRM: pt says she was in pcp office 2 weeks, she prescribed her vitamin d  500 mg, she says it made her legs and feet swell up , when she stopped takingit last thursday the swelling went down. There is no swelling currently while on the line with pt

## 2024-04-17 NOTE — Telephone Encounter (Signed)
 Since she is repoprting swelling with calcium  with D, I recommend she take calcium  by itsself. Like a glass of almond milk daily or fat free mil 12 ounces daily or two Tums daily, and for Vit D if unable to tolerate even a low dose try and increase sun exposure as able

## 2024-04-17 NOTE — Telephone Encounter (Signed)
 [email or web address]

## 2024-04-18 ENCOUNTER — Telehealth: Payer: Self-pay

## 2024-04-18 NOTE — Telephone Encounter (Signed)
 Copied from CRM [id number]. Topic: General - Other >> Apr 17, 2024  4:15 PM Fonda T wrote: Reason for CRM: Pt returning call to office, Heather.  Pt requesting a return call in reference to missed call, and to ask additional questions she has.  Pt can be reached at [phone].  Pt is aware of call back.

## 2024-04-18 NOTE — Telephone Encounter (Signed)
 Done

## 2024-07-31 ENCOUNTER — Ambulatory Visit: Payer: Self-pay | Admitting: Family Medicine

## 2025-03-11 ENCOUNTER — Ambulatory Visit
